# Patient Record
Sex: Female | Born: 1945 | Race: Black or African American | Hispanic: No | Smoking: Never smoker
Health system: Southern US, Community
[De-identification: ages and names within clinical notes are randomized; demographics above are authoritative.]

## PROBLEM LIST (undated history)

## (undated) DIAGNOSIS — I10 Essential (primary) hypertension: Secondary | ICD-10-CM

## (undated) DIAGNOSIS — M545 Low back pain, unspecified: Secondary | ICD-10-CM

## (undated) DIAGNOSIS — N183 Chronic kidney disease, stage 3 unspecified: Secondary | ICD-10-CM

## (undated) DIAGNOSIS — M170 Bilateral primary osteoarthritis of knee: Secondary | ICD-10-CM

## (undated) DIAGNOSIS — E079 Disorder of thyroid, unspecified: Secondary | ICD-10-CM

## (undated) DIAGNOSIS — M19011 Primary osteoarthritis, right shoulder: Secondary | ICD-10-CM

## (undated) DIAGNOSIS — G5603 Carpal tunnel syndrome, bilateral upper limbs: Secondary | ICD-10-CM

## (undated) DIAGNOSIS — J309 Allergic rhinitis, unspecified: Secondary | ICD-10-CM

## (undated) DIAGNOSIS — H547 Unspecified visual loss: Secondary | ICD-10-CM

## (undated) DIAGNOSIS — E785 Hyperlipidemia, unspecified: Secondary | ICD-10-CM

## (undated) DIAGNOSIS — G8929 Other chronic pain: Secondary | ICD-10-CM

## (undated) DIAGNOSIS — H269 Unspecified cataract: Secondary | ICD-10-CM

## (undated) HISTORY — PX: COLONOSCOPY: SHX174

## (undated) HISTORY — PX: CATARACT EXTRACTION: SUR2

## (undated) HISTORY — DX: Allergic rhinitis, unspecified: J30.9

## (undated) HISTORY — DX: Disorder of thyroid, unspecified: E07.9

## (undated) HISTORY — DX: Morbid (severe) obesity due to excess calories: E66.01

## (undated) HISTORY — DX: Essential (primary) hypertension: I10

## (undated) HISTORY — DX: Carpal tunnel syndrome, bilateral upper limbs: G56.03

## (undated) HISTORY — DX: Low back pain, unspecified: M54.50

## (undated) HISTORY — DX: Other chronic pain: G89.29

## (undated) HISTORY — PX: TUBAL LIGATION: SHX77

## (undated) HISTORY — DX: Unspecified visual loss: H54.7

## (undated) HISTORY — DX: Bilateral primary osteoarthritis of knee: M17.0

## (undated) HISTORY — DX: Primary osteoarthritis, right shoulder: M19.011

## (undated) HISTORY — DX: Chronic kidney disease, stage 3 unspecified: N18.30

## (undated) HISTORY — DX: Unspecified cataract: H26.9

## (undated) NOTE — *Deleted (*Deleted)
CARDIOLOGY CONSULT NOTE       Patient ID: Samantha [name] MRN: [id number] DOB/AGE: 1946/02/10 58 y.o.  Admit date: (Not on file) Referring Physician: Karl Ito Primary Physician: Renaye Rakers, MD Primary Cardiologist: New Reason for Consultation: ***  Active Problems:   * No active hospital problems. *   HPI:  23 y.o. with history of morbid obesity, DM, CKD 3b and hypertension. Activity also limited by chronic low Back pain and hip pain She is retired/widowed Use to smoke mariajuana and not cigarettes has had COVID vaccine  ? Some leg pain with Zetia but able to take statin   CR is 1.82 K 3.8    ROS All other systems reviewed and negative except as noted above  Past Medical History:  Diagnosis Date  . Diabetes mellitus   . Hyperlipidemia   . Hypertension     Family History  Problem Relation Age of Onset  . Breast cancer Mother     Social History   Socioeconomic History  . Marital status: Widowed    Spouse name: Not on file  . Number of children: Not on file  . Years of education: Not on file  . Highest education level: Not on file  Occupational History  . Not on file  Tobacco Use  . Smoking status: Never Smoker  . Smokeless tobacco: Never Used  Substance and Sexual Activity  . Alcohol use: No  . Drug use: No  . Sexual activity: Yes    Birth control/protection: Surgical  Other Topics Concern  . Not on file  Social History Narrative  . Not on file   Social Determinants of Health   Financial Resource Strain:   . Difficulty of Paying Living Expenses: Not on file  Food Insecurity:   . Worried About Programme researcher, broadcasting/film/video in the Last Year: Not on file  . Ran Out of Food in the Last Year: Not on file  Transportation Needs:   . Lack of Transportation (Medical): Not on file  . Lack of Transportation (Non-Medical): Not on file  Physical Activity:   . Days of Exercise per Week: Not on file  . Minutes of Exercise per Session: Not on file  Stress:   . Feeling  of Stress : Not on file  Social Connections:   . Frequency of Communication with Friends and Family: Not on file  . Frequency of Social Gatherings with Friends and Family: Not on file  . Attends Religious Services: Not on file  . Active Member of Clubs or Organizations: Not on file  . Attends Banker Meetings: Not on file  . Marital Status: Not on file  Intimate Partner Violence:   . Fear of Current or Ex-Partner: Not on file  . Emotionally Abused: Not on file  . Physically Abused: Not on file  . Sexually Abused: Not on file    Past Surgical History:  Procedure Laterality Date  . TUBAL LIGATION        Current Outpatient Medications:  .  acetaminophen (TYLENOL) 500 MG tablet, Take 1,000 mg by mouth every 6 (six) hours as needed for mild pain., Disp: , Rfl:  .  amLODipine (NORVASC) 5 MG tablet, Take 5 mg by mouth 2 (two) times daily., Disp: , Rfl:  .  aspirin EC 81 MG tablet, Take 81 mg by mouth daily., Disp: , Rfl:  .  cloNIDine (CATAPRES) 0.1 MG tablet, Take 0.1 mg by mouth daily. , Disp: , Rfl:  .  Cyanocobalamin ([employer]  B-12) 1500 MCG TBDP, Take 1 tablet by mouth daily., Disp: , Rfl:  .  cyclobenzaprine (FLEXERIL) 5 MG tablet, Take 1 tablet (5 mg total) by mouth 3 (three) times daily as needed for muscle spasms., Disp: 15 tablet, Rfl: 0 .  ezetimibe (ZETIA) 10 MG tablet, Take 10 mg by mouth daily., Disp: , Rfl:  .  Homeopathic Products ([employer] LEG CRAMPS PAIN RELIEF PO), Take 1 tablet by mouth daily as needed (for cramping). , Disp: , Rfl:  .  linagliptin (TRADJENTA) 5 MG TABS tablet, Take 5 mg by mouth daily. , Disp: , Rfl:  .  lisinopril-hydrochlorothiazide (PRINZIDE,ZESTORETIC) 20-12.5 MG per tablet, Take 2 tablets by mouth daily., Disp: , Rfl:  .  metFORMIN (GLUCOPHAGE) 500 MG tablet, Take 500 mg by mouth 2 (two) times daily with a meal., Disp: , Rfl:  .  potassium chloride SA (K-DUR,KLOR-CON) 20 MEQ tablet, Take 20 mEq by mouth daily., Disp: , Rfl:     Physical Exam:  There were no vitals taken for this visit. *** HELP TEXT ***  This SmartLink requires parameters. Parameters are variables that are added to the [facility] name to request specific information. The parameter for .curwt is the number of readings to display.  For example: .curwt[4  In this example, the SmartLink displays the last four encounter readings.    {physical ZOXW:[id number]}  Labs:   Lab Results  Component Value Date   WBC 7.6 05/08/2018   HGB 11.0 (L) 05/08/2018   HCT 37.7 05/08/2018   MCV 84.7 05/08/2018   PLT 270 05/08/2018   No results for input(s): NA, K, CL, CO2, BUN, CREATININE, CALCIUM, PROT, BILITOT, ALKPHOS, ALT, AST, GLUCOSE in the last 168 hours.  Invalid input(s): LABALBU Lab Results  Component Value Date   TROPONINI <0.03 09/06/2015    Lab Results  Component Value Date   CHOL 166 02/09/2009   CHOL 196 09/29/2008   CHOL 185 01/16/2008   Lab Results  Component Value Date   HDL 60 02/09/2009   HDL 74 09/29/2008   HDL 75 01/16/2008   Lab Results  Component Value Date   LDLCALC 89 02/09/2009   LDLCALC 100 (H) 09/29/2008   LDLCALC 100 (H) 01/16/2008   Lab Results  Component Value Date   TRIG 86 02/09/2009   TRIG 111 09/29/2008   TRIG 52 01/16/2008   Lab Results  Component Value Date   CHOLHDL 2.8 Ratio 02/09/2009   CHOLHDL 2.6 Ratio 09/29/2008   CHOLHDL 2.5 Ratio 01/16/2008   No results found for: LDLDIRECT    Radiology: No results found.  EKG: ***   ASSESSMENT AND PLAN:    Signed: Charlton Haws 03/18/2020, 2:22 PM

---

## 2001-12-31 ENCOUNTER — Inpatient Hospital Stay (HOSPITAL_COMMUNITY): Admission: AD | Admit: 2001-12-31 | Discharge: 2002-01-03 | Payer: Self-pay | Admitting: Family Medicine

## 2002-02-04 ENCOUNTER — Encounter: Admission: RE | Admit: 2002-02-04 | Discharge: 2002-05-05 | Payer: Self-pay | Admitting: Internal Medicine

## 2002-04-11 ENCOUNTER — Other Ambulatory Visit: Admission: RE | Admit: 2002-04-11 | Discharge: 2002-04-11 | Payer: Self-pay | Admitting: Obstetrics and Gynecology

## 2002-04-30 ENCOUNTER — Ambulatory Visit (HOSPITAL_COMMUNITY): Admission: RE | Admit: 2002-04-30 | Discharge: 2002-04-30 | Payer: Self-pay | Admitting: Internal Medicine

## 2002-04-30 ENCOUNTER — Encounter: Payer: Self-pay | Admitting: Internal Medicine

## 2002-06-17 ENCOUNTER — Ambulatory Visit (HOSPITAL_COMMUNITY): Admission: RE | Admit: 2002-06-17 | Discharge: 2002-06-17 | Payer: Self-pay | Admitting: Internal Medicine

## 2002-06-17 ENCOUNTER — Encounter: Payer: Self-pay | Admitting: Internal Medicine

## 2002-08-18 ENCOUNTER — Ambulatory Visit (HOSPITAL_COMMUNITY): Admission: RE | Admit: 2002-08-18 | Discharge: 2002-08-18 | Payer: Self-pay | Admitting: General Surgery

## 2002-08-18 ENCOUNTER — Encounter: Payer: Self-pay | Admitting: General Surgery

## 2002-12-19 ENCOUNTER — Encounter: Payer: Self-pay | Admitting: Internal Medicine

## 2002-12-19 ENCOUNTER — Ambulatory Visit (HOSPITAL_COMMUNITY): Admission: RE | Admit: 2002-12-19 | Discharge: 2002-12-19 | Payer: Self-pay | Admitting: Internal Medicine

## 2002-12-30 ENCOUNTER — Ambulatory Visit (HOSPITAL_BASED_OUTPATIENT_CLINIC_OR_DEPARTMENT_OTHER): Admission: RE | Admit: 2002-12-30 | Discharge: 2002-12-30 | Payer: Self-pay | Admitting: Ophthalmology

## 2004-03-17 ENCOUNTER — Ambulatory Visit (HOSPITAL_COMMUNITY): Admission: RE | Admit: 2004-03-17 | Discharge: 2004-03-17 | Payer: Self-pay | Admitting: General Surgery

## 2004-04-13 ENCOUNTER — Encounter (INDEPENDENT_AMBULATORY_CARE_PROVIDER_SITE_OTHER): Payer: Self-pay | Admitting: Family Medicine

## 2004-04-21 ENCOUNTER — Ambulatory Visit (HOSPITAL_COMMUNITY): Admission: RE | Admit: 2004-04-21 | Discharge: 2004-04-21 | Payer: Self-pay | Admitting: General Surgery

## 2005-07-13 ENCOUNTER — Ambulatory Visit (HOSPITAL_COMMUNITY): Admission: RE | Admit: 2005-07-13 | Discharge: 2005-07-13 | Payer: Self-pay | Admitting: General Surgery

## 2006-01-05 ENCOUNTER — Emergency Department (HOSPITAL_COMMUNITY): Admission: EM | Admit: 2006-01-05 | Discharge: 2006-01-05 | Payer: Self-pay | Admitting: Emergency Medicine

## 2006-02-21 ENCOUNTER — Ambulatory Visit: Payer: Self-pay | Admitting: Family Medicine

## 2006-02-21 LAB — CONVERTED CEMR LAB: Hgb A1c MFr Bld: 6.1 %

## 2006-03-01 ENCOUNTER — Ambulatory Visit: Payer: Self-pay | Admitting: Family Medicine

## 2006-03-15 ENCOUNTER — Ambulatory Visit: Payer: Self-pay | Admitting: Family Medicine

## 2006-04-09 ENCOUNTER — Encounter (INDEPENDENT_AMBULATORY_CARE_PROVIDER_SITE_OTHER): Payer: Self-pay | Admitting: Family Medicine

## 2006-04-09 LAB — CONVERTED CEMR LAB: Blood Glucose, Fasting: 87 mg/dL

## 2006-04-12 ENCOUNTER — Ambulatory Visit: Payer: Self-pay | Admitting: Family Medicine

## 2006-05-07 ENCOUNTER — Encounter: Payer: Self-pay | Admitting: Family Medicine

## 2006-05-07 DIAGNOSIS — D649 Anemia, unspecified: Secondary | ICD-10-CM | POA: Insufficient documentation

## 2006-05-07 DIAGNOSIS — E669 Obesity, unspecified: Secondary | ICD-10-CM

## 2006-05-07 DIAGNOSIS — I10 Essential (primary) hypertension: Secondary | ICD-10-CM | POA: Insufficient documentation

## 2006-05-07 DIAGNOSIS — G5600 Carpal tunnel syndrome, unspecified upper limb: Secondary | ICD-10-CM

## 2006-05-07 DIAGNOSIS — E785 Hyperlipidemia, unspecified: Secondary | ICD-10-CM

## 2006-08-20 ENCOUNTER — Encounter (INDEPENDENT_AMBULATORY_CARE_PROVIDER_SITE_OTHER): Payer: Self-pay | Admitting: Family Medicine

## 2006-09-22 ENCOUNTER — Telehealth (INDEPENDENT_AMBULATORY_CARE_PROVIDER_SITE_OTHER): Payer: Self-pay | Admitting: Family Medicine

## 2006-11-06 ENCOUNTER — Encounter (INDEPENDENT_AMBULATORY_CARE_PROVIDER_SITE_OTHER): Payer: Self-pay | Admitting: Family Medicine

## 2006-11-14 ENCOUNTER — Encounter (INDEPENDENT_AMBULATORY_CARE_PROVIDER_SITE_OTHER): Payer: Self-pay | Admitting: Family Medicine

## 2006-11-16 ENCOUNTER — Encounter (INDEPENDENT_AMBULATORY_CARE_PROVIDER_SITE_OTHER): Payer: Self-pay | Admitting: Family Medicine

## 2006-11-20 ENCOUNTER — Ambulatory Visit: Payer: Self-pay | Admitting: Family Medicine

## 2006-11-20 LAB — CONVERTED CEMR LAB
Glucose, Bld: 115 mg/dL
LDL Goal: 70 mg/dL

## 2006-12-03 ENCOUNTER — Encounter (INDEPENDENT_AMBULATORY_CARE_PROVIDER_SITE_OTHER): Payer: Self-pay | Admitting: Family Medicine

## 2006-12-04 LAB — CONVERTED CEMR LAB
ALT: 11 units/L (ref 0–35)
Alkaline Phosphatase: 64 units/L (ref 39–117)
Basophils Relative: 0 % (ref 0–1)
CO2: 26 meq/L (ref 19–32)
Calcium: 9.6 mg/dL (ref 8.4–10.5)
Chloride: 107 meq/L (ref 96–112)
Eosinophils Absolute: 0.1 10*3/uL (ref 0.0–0.7)
Glucose, Bld: 109 mg/dL — ABNORMAL HIGH (ref 70–99)
HCT: 38.2 % (ref 36.0–46.0)
Hemoglobin: 11.3 g/dL — ABNORMAL LOW (ref 12.0–15.0)
LDL Cholesterol: 118 mg/dL — ABNORMAL HIGH (ref 0–99)
Lymphocytes Relative: 36 % (ref 12–46)
MCHC: 29.6 g/dL — ABNORMAL LOW (ref 30.0–36.0)
MCV: 87.6 fL (ref 78.0–100.0)
Microalb Creat Ratio: 8.3 mg/g (ref 0.0–30.0)
Neutro Abs: 2.1 10*3/uL (ref 1.7–7.7)
Neutrophils Relative %: 55 % (ref 43–77)
Potassium: 4 meq/L (ref 3.5–5.3)
Total Bilirubin: 0.6 mg/dL (ref 0.3–1.2)
Total Protein: 7.3 g/dL (ref 6.0–8.3)
WBC: 3.8 10*3/uL — ABNORMAL LOW (ref 4.0–10.5)

## 2006-12-05 ENCOUNTER — Encounter (INDEPENDENT_AMBULATORY_CARE_PROVIDER_SITE_OTHER): Payer: Self-pay | Admitting: Family Medicine

## 2006-12-13 ENCOUNTER — Encounter (INDEPENDENT_AMBULATORY_CARE_PROVIDER_SITE_OTHER): Payer: Self-pay | Admitting: Family Medicine

## 2006-12-18 ENCOUNTER — Ambulatory Visit: Payer: Self-pay | Admitting: Family Medicine

## 2006-12-18 ENCOUNTER — Telehealth (INDEPENDENT_AMBULATORY_CARE_PROVIDER_SITE_OTHER): Payer: Self-pay | Admitting: *Deleted

## 2006-12-18 DIAGNOSIS — E785 Hyperlipidemia, unspecified: Secondary | ICD-10-CM | POA: Insufficient documentation

## 2006-12-18 DIAGNOSIS — E119 Type 2 diabetes mellitus without complications: Secondary | ICD-10-CM

## 2006-12-19 ENCOUNTER — Encounter (INDEPENDENT_AMBULATORY_CARE_PROVIDER_SITE_OTHER): Payer: Self-pay | Admitting: Family Medicine

## 2006-12-19 ENCOUNTER — Telehealth (INDEPENDENT_AMBULATORY_CARE_PROVIDER_SITE_OTHER): Payer: Self-pay | Admitting: *Deleted

## 2006-12-19 LAB — CONVERTED CEMR LAB
Ferritin: 93 ng/mL (ref 10–291)
Gardnerella vaginalis: POSITIVE — AB
Retic Count, Absolute: 60.8 (ref 19.0–186.0)
Retic Ct Pct: 1.4 % (ref 0.4–3.1)
Saturation Ratios: 30 % (ref 20–55)
UIBC: 225 ug/dL
Vitamin B-12: 353 pg/mL (ref 211–911)

## 2006-12-20 ENCOUNTER — Telehealth (INDEPENDENT_AMBULATORY_CARE_PROVIDER_SITE_OTHER): Payer: Self-pay | Admitting: *Deleted

## 2006-12-21 ENCOUNTER — Telehealth (INDEPENDENT_AMBULATORY_CARE_PROVIDER_SITE_OTHER): Payer: Self-pay | Admitting: Family Medicine

## 2006-12-24 ENCOUNTER — Encounter (INDEPENDENT_AMBULATORY_CARE_PROVIDER_SITE_OTHER): Payer: Self-pay | Admitting: Family Medicine

## 2007-01-08 ENCOUNTER — Encounter (INDEPENDENT_AMBULATORY_CARE_PROVIDER_SITE_OTHER): Payer: Self-pay | Admitting: Family Medicine

## 2007-01-29 ENCOUNTER — Ambulatory Visit: Payer: Self-pay | Admitting: Family Medicine

## 2007-01-30 ENCOUNTER — Telehealth (INDEPENDENT_AMBULATORY_CARE_PROVIDER_SITE_OTHER): Payer: Self-pay | Admitting: *Deleted

## 2007-01-30 ENCOUNTER — Encounter (INDEPENDENT_AMBULATORY_CARE_PROVIDER_SITE_OTHER): Payer: Self-pay | Admitting: Family Medicine

## 2007-01-30 LAB — CONVERTED CEMR LAB
ALT: 11 units/L (ref 0–35)
AST: 18 units/L (ref 0–37)
Albumin: 4.4 g/dL (ref 3.5–5.2)
Alkaline Phosphatase: 84 units/L (ref 39–117)
BUN: 14 mg/dL (ref 6–23)
CO2: 22 meq/L (ref 19–32)
Creatinine, Ser: 1.06 mg/dL (ref 0.40–1.20)
Glucose, Bld: 104 mg/dL — ABNORMAL HIGH (ref 70–99)
Total Bilirubin: 0.7 mg/dL (ref 0.3–1.2)
Total Protein: 7.3 g/dL (ref 6.0–8.3)

## 2007-06-03 ENCOUNTER — Ambulatory Visit: Payer: Self-pay | Admitting: Family Medicine

## 2007-06-03 LAB — CONVERTED CEMR LAB: Hgb A1c MFr Bld: 6.3 %

## 2007-06-25 ENCOUNTER — Other Ambulatory Visit: Admission: RE | Admit: 2007-06-25 | Discharge: 2007-06-25 | Payer: Self-pay | Admitting: Obstetrics and Gynecology

## 2007-08-28 ENCOUNTER — Encounter (INDEPENDENT_AMBULATORY_CARE_PROVIDER_SITE_OTHER): Payer: Self-pay | Admitting: Family Medicine

## 2007-11-11 ENCOUNTER — Ambulatory Visit: Payer: Self-pay | Admitting: Family Medicine

## 2007-11-11 LAB — CONVERTED CEMR LAB: Hgb A1c MFr Bld: 6.4 %

## 2007-11-25 ENCOUNTER — Encounter: Payer: Self-pay | Admitting: Orthopedic Surgery

## 2007-11-25 ENCOUNTER — Ambulatory Visit (HOSPITAL_COMMUNITY): Admission: RE | Admit: 2007-11-25 | Discharge: 2007-11-25 | Payer: Self-pay | Admitting: Family Medicine

## 2007-11-25 ENCOUNTER — Ambulatory Visit: Payer: Self-pay | Admitting: Family Medicine

## 2007-11-28 ENCOUNTER — Ambulatory Visit (HOSPITAL_COMMUNITY): Admission: RE | Admit: 2007-11-28 | Discharge: 2007-11-28 | Payer: Self-pay | Admitting: Family Medicine

## 2007-12-10 ENCOUNTER — Encounter (INDEPENDENT_AMBULATORY_CARE_PROVIDER_SITE_OTHER): Payer: Self-pay | Admitting: Family Medicine

## 2007-12-16 ENCOUNTER — Ambulatory Visit: Payer: Self-pay | Admitting: Orthopedic Surgery

## 2007-12-16 DIAGNOSIS — M1710 Unilateral primary osteoarthritis, unspecified knee: Secondary | ICD-10-CM

## 2007-12-16 DIAGNOSIS — M23302 Other meniscus derangements, unspecified lateral meniscus, unspecified knee: Secondary | ICD-10-CM | POA: Insufficient documentation

## 2008-01-01 ENCOUNTER — Ambulatory Visit: Payer: Self-pay | Admitting: Orthopedic Surgery

## 2008-01-06 ENCOUNTER — Encounter (INDEPENDENT_AMBULATORY_CARE_PROVIDER_SITE_OTHER): Payer: Self-pay | Admitting: Family Medicine

## 2008-01-16 ENCOUNTER — Ambulatory Visit: Payer: Self-pay | Admitting: Family Medicine

## 2008-01-17 LAB — CONVERTED CEMR LAB
AST: 19 units/L (ref 0–37)
BUN: 28 mg/dL — ABNORMAL HIGH (ref 6–23)
CO2: 24 meq/L (ref 19–32)
Cholesterol: 185 mg/dL (ref 0–200)
Creatinine, Ser: 1.41 mg/dL — ABNORMAL HIGH (ref 0.40–1.20)
Creatinine, Urine: 117.5 mg/dL
Eosinophils Absolute: 0.1 10*3/uL (ref 0.0–0.7)
Eosinophils Relative: 1 % (ref 0–5)
HCT: 37.8 % (ref 36.0–46.0)
MCHC: 30.2 g/dL (ref 30.0–36.0)
Microalb Creat Ratio: 5.4 mg/g (ref 0.0–30.0)
Microalb, Ur: 0.64 mg/dL (ref 0.00–1.89)
Potassium: 3.9 meq/L (ref 3.5–5.3)
RBC: 4.46 M/uL (ref 3.87–5.11)
RDW: 13.9 % (ref 11.5–15.5)
Sodium: 139 meq/L (ref 135–145)
TSH: 1.318 microintl units/mL (ref 0.350–4.50)
Total CHOL/HDL Ratio: 2.5
Triglycerides: 52 mg/dL (ref <150)
VLDL: 10 mg/dL (ref 0–40)
WBC: 5.2 10*3/uL (ref 4.0–10.5)

## 2008-02-04 ENCOUNTER — Ambulatory Visit: Payer: Self-pay | Admitting: Family Medicine

## 2008-02-04 ENCOUNTER — Other Ambulatory Visit: Admission: RE | Admit: 2008-02-04 | Discharge: 2008-02-04 | Payer: Self-pay | Admitting: Family Medicine

## 2008-02-04 ENCOUNTER — Encounter (INDEPENDENT_AMBULATORY_CARE_PROVIDER_SITE_OTHER): Payer: Self-pay | Admitting: Family Medicine

## 2008-02-06 ENCOUNTER — Ambulatory Visit (HOSPITAL_COMMUNITY): Admission: RE | Admit: 2008-02-06 | Discharge: 2008-02-06 | Payer: Self-pay | Admitting: Family Medicine

## 2008-03-31 ENCOUNTER — Ambulatory Visit: Payer: Self-pay | Admitting: Family Medicine

## 2008-03-31 LAB — CONVERTED CEMR LAB: Glucose, Bld: 128 mg/dL

## 2008-04-01 ENCOUNTER — Encounter (INDEPENDENT_AMBULATORY_CARE_PROVIDER_SITE_OTHER): Payer: Self-pay | Admitting: Family Medicine

## 2008-04-02 LAB — CONVERTED CEMR LAB
ALT: 14 units/L (ref 0–35)
AST: 17 units/L (ref 0–37)
Alkaline Phosphatase: 75 units/L (ref 39–117)
Bilirubin, Direct: 0.1 mg/dL (ref 0.0–0.3)
Indirect Bilirubin: 0.7 mg/dL (ref 0.0–0.9)
Total Bilirubin: 0.8 mg/dL (ref 0.3–1.2)
Total Protein: 7.1 g/dL (ref 6.0–8.3)

## 2008-06-30 ENCOUNTER — Ambulatory Visit: Payer: Self-pay | Admitting: Family Medicine

## 2008-09-12 ENCOUNTER — Emergency Department (HOSPITAL_COMMUNITY): Admission: EM | Admit: 2008-09-12 | Discharge: 2008-09-12 | Payer: Self-pay | Admitting: Emergency Medicine

## 2008-09-17 ENCOUNTER — Ambulatory Visit: Payer: Self-pay | Admitting: Family Medicine

## 2008-09-29 ENCOUNTER — Encounter (INDEPENDENT_AMBULATORY_CARE_PROVIDER_SITE_OTHER): Payer: Self-pay | Admitting: Family Medicine

## 2008-09-30 ENCOUNTER — Encounter (INDEPENDENT_AMBULATORY_CARE_PROVIDER_SITE_OTHER): Payer: Self-pay | Admitting: *Deleted

## 2008-09-30 LAB — CONVERTED CEMR LAB
Albumin: 4.2 g/dL (ref 3.5–5.2)
Calcium: 9.7 mg/dL (ref 8.4–10.5)
Cholesterol: 196 mg/dL (ref 0–200)
HDL: 74 mg/dL (ref >39)
LDL Cholesterol: 100 mg/dL — ABNORMAL HIGH (ref 0–99)
Potassium: 3.8 meq/L (ref 3.5–5.3)
Total Bilirubin: 0.6 mg/dL (ref 0.3–1.2)
Total CHOL/HDL Ratio: 2.6
VLDL: 22 mg/dL (ref 0–40)

## 2008-10-02 ENCOUNTER — Ambulatory Visit: Payer: Self-pay | Admitting: Family Medicine

## 2009-01-01 ENCOUNTER — Ambulatory Visit: Payer: Self-pay | Admitting: Family Medicine

## 2009-01-01 LAB — CONVERTED CEMR LAB: Hgb A1c MFr Bld: 6.8 %

## 2009-02-09 ENCOUNTER — Other Ambulatory Visit: Admission: RE | Admit: 2009-02-09 | Discharge: 2009-02-09 | Payer: Self-pay | Admitting: Family Medicine

## 2009-02-10 ENCOUNTER — Ambulatory Visit: Payer: Self-pay | Admitting: Family Medicine

## 2009-02-10 LAB — CONVERTED CEMR LAB
ALT: 11 units/L (ref 0–35)
AST: 18 units/L (ref 0–37)
Albumin: 4 g/dL (ref 3.5–5.2)
Alkaline Phosphatase: 76 units/L (ref 39–117)
BUN: 17 mg/dL (ref 6–23)
CO2: 23 meq/L (ref 19–32)
Calcium: 9.3 mg/dL (ref 8.4–10.5)
Creatinine, Ser: 1.2 mg/dL (ref 0.40–1.20)
Creatinine, Urine: 216.4 mg/dL
Eosinophils Absolute: 0.1 10*3/uL (ref 0.0–0.7)
Eosinophils Relative: 2 % (ref 0–5)
Glucose, Bld: 144 mg/dL — ABNORMAL HIGH (ref 70–99)
LDL Cholesterol: 89 mg/dL (ref 0–99)
MCHC: 31.1 g/dL (ref 30.0–36.0)
MCV: 84.3 fL (ref 78.0–100.0)
Monocytes Absolute: 0.3 10*3/uL (ref 0.1–1.0)
Neutrophils Relative %: 50 % (ref 43–77)
Platelets: 262 10*3/uL (ref 150–400)
RBC: 4.2 M/uL (ref 3.87–5.11)
RDW: 14.1 % (ref 11.5–15.5)
Sodium: 138 meq/L (ref 135–145)
Total CHOL/HDL Ratio: 2.8
Total Protein: 7.1 g/dL (ref 6.0–8.3)
WBC: 5.5 10*3/uL (ref 4.0–10.5)

## 2009-02-11 LAB — CONVERTED CEMR LAB: Ferritin: 127 ng/mL (ref 10–291)

## 2009-02-19 ENCOUNTER — Ambulatory Visit (HOSPITAL_COMMUNITY): Admission: RE | Admit: 2009-02-19 | Discharge: 2009-02-19 | Payer: Self-pay | Admitting: Family Medicine

## 2009-11-29 ENCOUNTER — Ambulatory Visit (HOSPITAL_COMMUNITY): Admission: RE | Admit: 2009-11-29 | Discharge: 2009-11-29 | Payer: Self-pay | Admitting: Family Medicine

## 2009-12-20 ENCOUNTER — Encounter: Payer: Self-pay | Admitting: Urgent Care

## 2009-12-20 ENCOUNTER — Ambulatory Visit: Payer: Self-pay | Admitting: Internal Medicine

## 2010-01-20 ENCOUNTER — Ambulatory Visit (HOSPITAL_COMMUNITY): Admission: RE | Admit: 2010-01-20 | Discharge: 2010-01-20 | Payer: Self-pay | Admitting: Internal Medicine

## 2010-01-20 ENCOUNTER — Ambulatory Visit: Payer: Self-pay | Admitting: Internal Medicine

## 2010-01-24 ENCOUNTER — Encounter: Payer: Self-pay | Admitting: Internal Medicine

## 2010-03-07 ENCOUNTER — Ambulatory Visit (HOSPITAL_COMMUNITY): Admission: RE | Admit: 2010-03-07 | Discharge: 2010-03-07 | Payer: Self-pay | Admitting: Family Medicine

## 2010-04-25 ENCOUNTER — Ambulatory Visit (HOSPITAL_COMMUNITY): Admission: RE | Admit: 2010-04-25 | Discharge: 2010-04-25 | Payer: Self-pay | Admitting: Family Medicine

## 2010-07-03 ENCOUNTER — Encounter: Payer: Self-pay | Admitting: Family Medicine

## 2010-07-04 ENCOUNTER — Encounter: Payer: Self-pay | Admitting: Family Medicine

## 2010-07-12 NOTE — Letter (Signed)
Summary: referral from dr. bland  referral from dr. bland   Imported By: Rosine Beat 12/20/2009 15:36:04  _____________________________________________________________________  External Attachment:    Type:   Image     Comment:   External Document

## 2010-07-12 NOTE — Assessment & Plan Note (Signed)
Summary: LOW HEMOGLOBIN,CONSULT FOR TCS/SS   Visit Type:  Initial Consult Referring Provider:  Dr Parke Simmers Primary Care Provider:  Dr. Renaye Rakers  Chief Complaint:  low hgb, consult for tcs, and hx of constipation.  History of Present Illness: 65 y/o black female w/ hx lifelong anemia, recently hgb dropped to 11.4.  Denies rectal bleeding, melena, N/V/D, abd pain or constipation.  Needs colonoscopy due to poor prep on last exam 2005 Dr Katrinka Blazing.  Takes ASA 81mg  daily.  Denies any other NSAIDS.  Denies dyysphagia or odynophagia.  Denies heartburn, indigestion.  Denies fatigue, chest pain, palpitations, SOB.  Is craving ice.  Never required transfusion.  Denies donation.  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 Every Other Day 2)  Lipitor 40 Mg Tabs (Atorvastatin Calcium) .... One Daily 3)  Klor-Con 10 10 Meq Cr-Tabs (Potassium Chloride) .... Once Daily 4)  Lisinopril-Hydrochlorothiazide 20-12.5 Mg Tabs (Lisinopril-Hydrochlorothiazide) .... Two Daily 5)  Cardizem La 360 Mg Tb24 (Diltiazem Hcl Coated Beads) .... Daily 6)  Clonidine Hcl 0.1 Mg  Tabs (Clonidine Hcl) .... Two Times A Day 7)  Metformin Hcl 500 Mg Tabs (Metformin Hcl) .... Two Times A Day 8)  Accu-Chek Softclix Lancets  Misc (Lancets) .... Check Fsbs Daily  Allergies (verified): No Known Drug Allergies  Past History:  Past Medical History: Current Problems:  Last colonoscopy 04/2004 sigmoid diverticula Dr Katrinka Blazing (POOR PREP) DERANGEMENT MENISCUS (ICD-717.5) ANSERINE BURSITIS, RIGHT (ICD-726.61) KNEE, ARTHRITIS, DEGEN./OSTEO (ICD-715.96) KNEE PAIN, RIGHT  (ICD-719.46) OBESITY NOS (ICD-278.00) CARPAL TUNNEL SYNDROME (ICD-354.0) HYPERTENSION (ICD-401.9) HYPERLIPIDEMIA (ICD-272.4) DIABETES MELLITUS, TYPE II, CONTROLLED (ICD-250.00) ANEMIA-NOS (ICD-285.9)  Family History: No known family history of colorectal carcinoma, IBD.  Father: kidney failure - HTN Dead 77 Mother: breast ca; HTN; kidney failure; DM 82 4 sisters: HTN - ages 22, 65 and 83, one dead at 42 due to non-alcoholic liver disease (multicystic?)  Brothers none 3 children: healthy - boys - 1 age 35 - Healthy, Girls x 2 - age 31 and 99 - healthy  Social History: Married Never Smoked Alcohol use-no Drug use-no Occupation: Doctor, general practice - retired 2008 Lives with husband  Education - high school and some college. Patient gets regular exercise. Does Patient Exercise:  yes  Review of Systems General:  Complains of weight loss; denies fever, chills, sweats, anorexia, fatigue, weakness, malaise, and sleep disorder; 14# intentional w/ exercise/dieting. CV:  Denies chest pains, angina, palpitations, syncope, dyspnea on exertion, orthopnea, PND, peripheral edema, and claudication. Resp:  Denies dyspnea at rest, dyspnea with exercise, cough, sputum, wheezing, coughing up blood, and pleurisy. GI:  Denies difficulty swallowing, pain on swallowing, nausea, indigestion/heartburn, vomiting, vomiting blood, abdominal pain, jaundice, gas/bloating, diarrhea, constipation, change in bowel habits, bloody BM's, black BMs, and fecal incontinence. GU:  Denies urinary burning, blood in urine, nocturnal urination, urinary frequency, and urinary incontinence. MS:  Denies joint pain / LOM, joint swelling, joint stiffness, joint deformity, low back pain, muscle weakness, muscle cramps, muscle atrophy, leg pain at night, leg pain with exertion, and shoulder pain / LOM hand / wrist pain (CTS). Derm:  Denies rash, itching, dry skin, hives, moles, warts, and unhealing ulcers. Psych:  Denies depression,  anxiety, memory loss, suicidal ideation, hallucinations, paranoia, phobia, and confusion. Heme:  Complains of bruising; denies bleeding and enlarged lymph nodes.  Vital Signs:  Patient profile:   65 year old female Menstrual status:  postmenopausal Height:      61 inches Weight:      200 pounds BMI:     37.93 Temp:     98.3 degrees F oral Pulse rate:   88 / minute BP sitting:   120 / 82  (left arm) Cuff size:   large  Vitals Entered By: Hendricks Limes LPN (December 20, 2009 10:52 AM)  Physical Exam  General:  obese.  Well developed, well nourished, no acute distress. Head:  Normocephalic and atraumatic. Eyes:  Sclera clear, no icterus. Ears:  Normal auditory acuity. Nose:  No deformity, discharge,  or lesions. Mouth:  No deformity or lesions, dentition normal. Neck:  Supple; no masses or thyromegaly. Lungs:  Clear throughout to auscultation. Heart:  Regular rate and rhythm; no murmurs, rubs,  or bruits. Abdomen:  Soft, nontender and nondistended. No masses, hepatosplenomegaly or hernias noted. Normal bowel sounds.obese, without guarding, and without rebound.   Rectal:  rectal mass palpated about 4-5 cm on DRE.  Anterior fullness from possible uterine lesions (?fibroids) as well.  Exam limited given pt's body habitus.  Ext hemorrhoids, non-thrombosed.  Heme negative stool.   Msk:  Symmetrical with no gross deformities. Normal posture. Pulses:  Normal pulses noted. Extremities:  No clubbing, cyanosis, edema or deformities noted. Neurologic:  Alert and  oriented x4;  grossly normal neurologically. Skin:  Intact without significant lesions or rashes. Cervical Nodes:  No significant cervical adenopathy. Psych:  Alert and cooperative. Normal mood and affect.  Impression & Recommendations:  Problem # 1:  ANEMIA-NOS (ICD-285.9) 65 y/o black female w/ recent drop in hgb with hx chronic normocytic anemia.  Last colonscopy w/ POOR PREP 2005 by Dr Katrinka Blazing, will need repeat exam to r/o colorectal  CA given digital rectal exam findings.  Uterine fibroids could be contributing to anemia as well, but pt denies any recent vaginal bleeding, nor upper GI tract symptoms.  Diagnostic colonoscopy to be performed by Dr. Suszanne Conners Rourk in the near future.  I have discussed risks and benefits which include, but are not limited to, bleeding, infection, perforation, or medication reaction.  The patient agrees with this plan and consent will be obtained.  Orders: Consultation Level III [phone])  Appended Document: LOW HEMOGLOBIN,CONSULT FOR TCS/SS       Allergies: No Known Drug Allergies   Other Orders: Hemoccult Guaiac-1 spec.(in office) ([id number])

## 2010-07-12 NOTE — Letter (Signed)
Summary: Patient Notice, Colon Biopsy Results  [facility]  [address]   [place], [place] [address]   Phone: [phone]  Fax: [phone]       January 24, 2010   Samantha [name] [address] [place], [place]  [address] 1945/12/30    Dear Ms. Samantha [name],  I am pleased to inform you that the biopsies taken during your recent colonoscopy did not show any evidence of cancer upon pathologic examination.  Additional information/recommendations:  No further action is needed at this time as long as you aren't having in blood in your stools.  Please follow-up with your primary care physician for your other healthcare needs.  You should have a repeat colonoscopy examination  in 7 years.  Please call us if you are having persistent problems or have questions about your condition that have not been fully answered at this time.  Sincerely,    R. Roetta Sessions MD, FACP [facility] Ph: [phone]    Fax: [phone]   Appended Document: Patient Notice, Colon Biopsy Results mailed letter to pt  Appended Document: Patient Notice, Colon Biopsy Results REMINDER IN COMPUTER

## 2010-07-12 NOTE — Letter (Signed)
Summary: Internal Other  Internal Other   Imported By: Peggyann Shoals 12/20/2009 11:47:10  _____________________________________________________________________  External Attachment:    Type:   Image     Comment:   External Document

## 2010-08-26 LAB — GLUCOSE, CAPILLARY: Glucose-Capillary: 125 mg/dL — ABNORMAL HIGH (ref 70–99)

## 2010-09-21 LAB — BASIC METABOLIC PANEL
CO2: 29 mEq/L (ref 19–32)
Chloride: 103 mEq/L (ref 96–112)
Creatinine, Ser: 1.31 mg/dL — ABNORMAL HIGH (ref 0.4–1.2)
GFR calc Af Amer: 50 mL/min — ABNORMAL LOW (ref >60)
Glucose, Bld: 142 mg/dL — ABNORMAL HIGH (ref 70–99)
Sodium: 138 mEq/L (ref 135–145)

## 2010-10-28 NOTE — H&P (Signed)
Samantha [name], Samantha [name]                   ACCOUNT NO.:  [id number]   MEDICAL RECORD NO.:  [id number]          PATIENT TYPE:  AMB   LOCATION:  DAY                           FACILITY:  [facility]   PHYSICIAN:  Leroy C. Katrinka Blazing, M.D.   DATE OF BIRTH:  January 09, 1946   DATE OF ADMISSION:  DATE OF DISCHARGE:  LH                                HISTORY & PHYSICAL   A 65 year old female referred for colonoscopy.  The patient has a family  history of colon polyps and a questionable family history of colon cancer.  She has had guaiac positive stools.  She was referred for colonoscopy about  three years ago but failed to followup.  She has had a history of anemia.  Her anemia has been stable over the past year.  She will have a screening  colonoscopy because of increased risk based on family history and guaiac  positive stool.   PAST HISTORY:  1.  Hypertension.  2.  History of TIAs.  3.  Hyperlipidemia.  4.  Diabetes mellitus.  5.  Osteoarthritis.   MEDICATIONS:  1.  Cardizem LA 360 mg every day.  2.  Prinizide 20/25 every day.  3.  Antivert 25 mg q.6h. p.r.n.  4.  Lipitor 10 mg q.h.s.   ALLERGIES:  None.   FAMILY HISTORY:  Father for questionable colon cancer.  Kidney disease,  diabetes mellitus, breast cancer, hypertension.   PHYSICAL EXAMINATION:  VITAL SIGNS:  Blood pressure 120/80, pulse 80,  respirations 18, weight 195 pounds.  HEENT:  Unremarkable.  NECK:  Supple.  No JVD or bruits.  CHEST:  Clear to auscultation.  No rales, rubs, rhonchi, or wheezes.  HEART:  Regular rate and rhythm without murmur, gallop or rub.  BREASTS:  Normal skin.  No nipple discharge or tenderness.  No palpable  masses.  No tenderness.  Axilla normal bilaterally without adenopathy.  ABDOMEN:  Soft, nontender, no masses.  RECTAL:  Normal.  No masses.  Normal sphincter tone.  Stool guaiac negative.  EXTREMITIES:  No cyanosis, clubbing, or edema.  NEUROLOGIC:  No focal motor, sensory, or cerebellar deficit.   IMPRESSION:  1.  Prior history of guaiac positive stool with family history of colon      polyps and questionable family history of colon cancer.  2.  Hypertension.  3.  Diabetes mellitus.  4.  Hyperlipidemia.  5.  Osteoarthritis.  6.  Anemia.   PLAN:  The patient will have a screening colonoscopy because of increased  risk.     Lero   LCS/MEDQ  D:  04/21/2004  T:  04/21/2004  Job:  [id number]

## 2010-10-28 NOTE — H&P (Signed)
[facility]  Patient:    Samantha [name], MCKERCHER Visit Number: [id number] MRN: [id number]          Service Type: MED Location: 3A P329 [id number] Attending Physician:  Evlyn Courier Dictated by:   Mirna Mires, M.D. 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  Chief complaints are increased thirst, increased frequency of urination, and lack of energy.  The patient was seen in the office on the day of admission for evaluation of these symptoms.  The lab notified me on the day of admission that her blood sugar was greater than 800. She also complained of blurred vision.  She denied carbuncle, weight loss, vaginal itching, vaginal discharge, stomach pain, nausea, or vomiting.  PAST MEDICAL HISTORY:  Positive for hypertension and hyperlipidemia.  Medical history is negative for tuberculosis, cancer, sickle cell, asthma, or seizure disorder.  According to the patient, she was treated in the office with insulin, subcutaneous by Dr. Sherrie Mustache, and started on Metaglip.  Sexually transmitted disease history is negative for gonorrhea, syphilis, herpes, and HIV infection.  ALLERGIES:  Patient is not allergic to any known medications.  HABITS:  Negative for tobacco, ethanol, and street drugs.  PAST MEDICAL HISTORY:  Positive hospitalization for pregnancy.  PAST SURGICAL HISTORY:  Positive for bilateral tubal ligation at age 8.  FAMILY HISTORY:  Mother living age 30 with history of diabetes and breast cancer; father deceased age 98 secondary to complications of end-stage renal disease; three sisters living, age 91 with a history of hypertension, age 68 in good health, and age 71 with a history of hypertension.  One son living, age 40, in good health.  Two daughters living, age 32  and 29, in good health.  REVIEW OF SYSTEMS:  Negative for chronic headache, epistaxis, bleeding gum, dysphagia, shortness of breath, chronic cough, wheezing, lumps in breasts, discharge from breasts, chest pain, syncope, dizziness, abdominal pain, hematemesis, melena, diarrhea, constipation, edema of leg, leg ulcers, unexplained fever, etc.  Last menstrual period was age 49.  PHYSICAL EXAMINATION:  VITAL SIGNS:  Temperature 98.0, pulse 93, respirations 20, blood pressure 164/98.  GENERAL APPEARANCE:  A middle-aged, overweight, medium height, alert black female in no apparent respiratory distress.  SKIN:  Warm and dry.  HEENT:  Head normocephalic.  Ears - normal auricles.  External canals patent. Tympanic membranes pearly gray.  Eyes - lids negative for ptosis, sclerae white, pupils round, equal, and reactive to light.  Extraocular movement intact.  Nose - negative discharge.  Mouth - positive upper dentures.  Few teeth at bottom in fair condition.  No bleeding gum.  Posterior pharynx benign.  NECK:  Negative for lymphadenopathy or thyromegaly.  LUNGS:  Clear.  HEART:  Audible S1, S2, without murmur, rub, or gallop, a regular rate and rhythm.  BREASTS:  No skin changes.  No nodule on palpation.  Nipple erect without discharge.  ABDOMEN:  Obese, positive for old healed umbilical surgical scar, soft, nontender all four quadrants.  No palpable masses, no organomegaly.  PELVIC:  Deferred.  RECTAL:  No external lesions.  Digital exam:  Positive for stool in rectal vault.  No rectal vault masses.  Stool guaiac negative.  EXTREMITIES:  No edema, no joint swelling, no joint redness, no joint hotness.  NEUROLOGIC:  Alert and oriented to person, place, and time.  Cranial nerves 2-12 appeared intact.  IMPRESSION:  New onset of diabetes mellitus.  SECONDARY DIAGNOSES: 1. Hypertension. 2. Hyperlipidemia.  PLAN:  IV fluids, normal saline at 200 cc per hour.  Labs, CBC,  hemoglobin A1C, MET 7, fasting lipid profile, liver function tests, urinalysis, dietician consult, Humulin R sliding scale.  Diet - 4 g sodium, low cholesterol, 1600 ADA.  EKG, chest x-ray.  MEDICATIONS: 1. One aspirin 81 mg p.o. every day. 2. Lisinopril/HCTZ 20/12.5 p.o. every day. 3. Lipitor 10 mg p.o. every day. Dictated by:   Mirna Mires, M.D. Attending Physician:  Evlyn Courier DD:  12/31/01 TD:  12/31/01 Job: [id number]

## 2010-10-28 NOTE — Discharge Summary (Signed)
Samantha [name], Samantha [name]                             ACCOUNT NO.:  [id number]   MEDICAL RECORD NO.:  [id number]                   PATIENT TYPE:  INP   LOCATION:  A307                                 FACILITY:  [facility]   PHYSICIAN:  Annia Friendly. Loleta Chance, M.D.                DATE OF BIRTH:  [birth year]   DATE OF ADMISSION:  12/31/2001  DATE OF DISCHARGE:  01/03/2002                                 DISCHARGE SUMMARY   HISTORY OF PRESENT ILLNESS:  The patient is a 65 year old gravida 3, para 3,  abortion 0, black female from [place], [place]. Her chief  complaints were increased thirst, increased frequency of urination and lack  of energy.  The patient was seen in the office on the day of admission for  evaluation of these symptoms.  The laboratory notified me on the day of  admission that her blood sugar was greater than 800.  She also complained of  blurred vision.  She denied skin infection, weight loss, vaginal itching,  vaginal discharge, stomach pain, nausea and vomiting.   PAST MEDICAL HISTORY:  Her medical history is positive for hypertension and  hyperlipidemia.  Past medical history positive for hospitalizations for  pregnancy.   ALLERGIES:  The patient is allergic to no medications.   HABITS:  Negative for tobacco, ethanol or street drugs.   FAMILY HISTORY:  Mother living at age 50 with a history of diabetes mellitus  and breast cancer; father deceased at age 33 secondary to complications from  end-stage renal disease; three sisters living, age 76 with a history of  hypertension, age 17 in good health, and age 13 with a history of  hypertension; one son living at age 66 in good health; two daughters living,  ages 93 and 18, in good health.   PHYSICAL EXAMINATION:  GENERAL APPEARANCE:  Physical examination reveals a  middle-aged, overweight, medium height, alert black female in no apparent  respiratory distress.  SKIN:  Warm and dry.  Skin is negative for lesions.  LUNGS:   Lungs were clear.  HEART:  Heart revealed audible S1, S2 without murmur, rub or gallop.  Rhythm  was regular and rate within normal limits.  ABDOMEN:  Abdomen is obese and positive for an old healed umbilical surgical  scar.  Abdomen is soft, nontender in all four quadrants.  No palpable masses  or organomegaly.  EXTREMITIES:      Extremities demonstrate no edema, no joint swelling, no  joint redness, no joint hotness.  The patient was neurologically intact.   LABORATORY AND ACCESSORY DATA:  New onset diabetes mellitus.  Significant  labs on admission were as follows:  White count 8.8, hemoglobin 13.1,  hematocrit 39.5.  Sodium 126, potassium 3.3, chloride 89, serum glucose 431,  BUN 15, creatinine 1.3, calcium 9.7, total protein of 7.4.  Albumin 4.1.  AST 21.  ALT 26.  ALP  125.  Total bilirubin 0.7.  Hemoglobin A1C was 12.5%  (normal 4.6 to 6.5).  Urinalysis (specific gravity of 1.015) revealed pH of  5.0, glucose greater than 1000, no hemoglobin, no bilirubin, no ketones, no  protein, nitrate positive and may bacteria.  It should be noted that the  patient was given insulin in the office by Dr. Sherrie Mustache.  Therefore, this  would account for the discrepancy in the outpatient and inpatient labs.   HOSPITAL COURSE:   PROBLEM #1:  The patient was treated with intravenous fluids using normal  saline, Humulin-R sliding scale, dietitian consultation, diabetic teaching,  Accu-Chek AC and bedtime, and diet 1600 ADA, 4 gram sodium, low cholesterol.  Blood sugar was declining at the end of the hospitalization.  Her last Accu-  Chek revealed a value of 245 on January 03, 2002 at 1100.   The patient was alert and oriented to person, place and time.  She was not  complaining about increased thirst, increased frequency of urination,  abdominal pain, nausea, vomiting, blurred vision or vaginal itching.  She  will follow-up with her family doctor for adjustment of her medications.  She was on oral  hypoglycemic agents at the time of discharge (Glucotrol-XL  5, Glucophage 500 mg, and Avandia 4 mg).  Also, an outpatient appointment  will be made with the ophthalmologist and her blood sugar was brought also  under control.  The patient will follow-up with her dietitian as an  outpatient as part of management for her diabetes. The patient was  discharged to her home, where she lives with her husband, on 01/03/02.   PROBLEM #2:  Hypertension.  Blood pressure on admission was 164/98.  Lungs  were clear on examination.  Heart revealed audible S1 and S2 without murmur,  rub or gallop.  Her extremities demonstrated no edema.  The patient was  treated with lisinopril-HCT 20/12.5 one tablet p.o. every day and a low-  sodium diet. Her blood pressure was managed during this hospitalization  without problem.  Her blood pressure on the day of discharge was 106/74.  Pulse was regular.  The patient had no complaint of palpitations, dizziness,  diaphoresis, shortness of breath, or chest pain.   PROBLEM #3:  Hyperlipidemia.  Fasting lipid profile during this  hospitalization demonstrated the following on 01/01/02 at 0655:  Total  cholesterol 153 mg/dl, triglycerides 161 mg/dl, high-density lipoprotein 61  mg/dl, low-density lipoprotein 7 mg/dl.  The patient was already on Lipitor  10 mg p.o. every day.  Therefore, she was continued on it during this  hospitalization.   Liver function tests revealed the following during this hospitalization:  AST 21, ALT 26, ALP 125, total bilirubin 0.7.  Liver function tests will be  monitored periodically as an outpatient.   PROBLEM #4:  Acute urinary tract infection secondary to Escherichia coli.  Urinalysis on admission reflected possible infection.  Urine culture was  ordered.  The urine culture grew greater than 100,000 Escherichia coli.  Therefore, the patient was discharged home on Levaquin 250 mg p.o. every  day.  PROBLEM #5:  Electrolyte imbalance.  The  serum electrolytes on admission  were as follows:  Sodium 126, potassium 3.3, chloride 89, carbon dioxide 30.  The patient was treated with normal saline, intravenous fluids and potassium  supplementation.  The last electrolytes were collected on 01/02/02 and  demonstrated the following:  Sodium 133, potassium 4.3, chloride 103, carbon  dioxide 31.  The patient was not complaining of any weakness, dizziness  or  fatigue at the time of discharge.  She also was not complaining of any  nausea, vomiting or diarrhea.   DISCHARGE INSTRUCTIONS:  Instructions at the time of discharge were as  follows:  1. Diet - low-salt, low-cholesterol, 1600-calorie ADA.  2. Activities - no restriction.  3. Medications - Glucotrol-XL 5 mg one tablet p.o. 30 minutes before meals     and one tablet p.o. 30 minutes before supper, Glucophage 500 mg one     tablet twice a day (with breakfast and supper), Avandia 4 mg one tablet     p.o. every morning, one aspirin 81 mg p.o. every day, lisinopril-HCT     20/12.5 mg one tablet p.o. every day, Lipitor 10 mg one tablet p.o. at     bedtime, Levaquin 250 mg one tablet p.o. daily times three days.  4. Follow-up - follow-up with Dr. Sherrie Mustache on January 10, 2002 at the office.  5. Patient to do Accu-Chek at 7:00 a.m., 5:00 p.m. and 10:00 p.m. every day.     A Glucometer was ordered for the patient during this hospitalization and     she received diabetic teaching, as mentioned earlier.   PRIMARY DIAGNOSES:  1. New onset diabetes mellitus.  2. Acute urinary tract infection secondary to Escherichia coli.  3. Electrolyte imbalance secondary to number one.   SECONDARY DIAGNOSES:  1. Hypertension.  2. Hyperlipidemia.                                               Annia Friendly. Loleta Chance, M.D.    Levonne Hubert  D:  01/05/2002  T:  01/12/2002  Job:  [id number]   cc:   Annia Friendly. Loleta Chance, M.D.

## 2011-10-17 ENCOUNTER — Other Ambulatory Visit (HOSPITAL_COMMUNITY): Payer: Self-pay | Admitting: Family Medicine

## 2011-10-17 DIAGNOSIS — Z139 Encounter for screening, unspecified: Secondary | ICD-10-CM

## 2011-10-19 ENCOUNTER — Ambulatory Visit (HOSPITAL_COMMUNITY): Payer: Self-pay

## 2011-11-07 ENCOUNTER — Ambulatory Visit (HOSPITAL_COMMUNITY): Payer: Self-pay

## 2011-11-09 ENCOUNTER — Ambulatory Visit (HOSPITAL_COMMUNITY): Payer: Self-pay

## 2011-11-13 ENCOUNTER — Ambulatory Visit (HOSPITAL_COMMUNITY)
Admission: RE | Admit: 2011-11-13 | Discharge: 2011-11-13 | Disposition: A | Discharge status: Home / Self Care | Payer: Medicare Other | Source: Ambulatory Visit | Attending: Family Medicine | Admitting: Family Medicine

## 2011-11-13 DIAGNOSIS — Z139 Encounter for screening, unspecified: Secondary | ICD-10-CM

## 2011-11-13 DIAGNOSIS — Z1231 Encounter for screening mammogram for malignant neoplasm of breast: Secondary | ICD-10-CM | POA: Insufficient documentation

## 2011-11-14 ENCOUNTER — Encounter (HOSPITAL_COMMUNITY): Payer: Self-pay | Admitting: Emergency Medicine

## 2011-11-14 ENCOUNTER — Emergency Department (HOSPITAL_COMMUNITY): Payer: Medicare Other

## 2011-11-14 ENCOUNTER — Emergency Department (HOSPITAL_COMMUNITY)
Admission: EM | Admit: 2011-11-14 | Discharge: 2011-11-14 | Disposition: A | Discharge status: Home / Self Care | Payer: Medicare Other | Attending: Emergency Medicine | Admitting: Emergency Medicine

## 2011-11-14 DIAGNOSIS — E119 Type 2 diabetes mellitus without complications: Secondary | ICD-10-CM | POA: Insufficient documentation

## 2011-11-14 DIAGNOSIS — R42 Dizziness and giddiness: Secondary | ICD-10-CM | POA: Insufficient documentation

## 2011-11-14 DIAGNOSIS — R209 Unspecified disturbances of skin sensation: Secondary | ICD-10-CM | POA: Insufficient documentation

## 2011-11-14 DIAGNOSIS — I10 Essential (primary) hypertension: Secondary | ICD-10-CM | POA: Insufficient documentation

## 2011-11-14 HISTORY — DX: Essential (primary) hypertension: I10

## 2011-11-14 HISTORY — DX: Hyperlipidemia, unspecified: E78.5

## 2011-11-14 LAB — CBC
Hemoglobin: 11.4 g/dL — ABNORMAL LOW (ref 12.0–15.0)
MCH: 26 pg (ref 26.0–34.0)
MCV: 82.7 fL (ref 78.0–100.0)
RBC: 4.39 MIL/uL (ref 3.87–5.11)

## 2011-11-14 LAB — COMPREHENSIVE METABOLIC PANEL
BUN: 14 mg/dL (ref 6–23)
Calcium: 9.8 mg/dL (ref 8.4–10.5)
Creatinine, Ser: 1.07 mg/dL (ref 0.50–1.10)
GFR calc Af Amer: 62 mL/min — ABNORMAL LOW (ref >90)
Glucose, Bld: 114 mg/dL — ABNORMAL HIGH (ref 70–99)
Total Protein: 8 g/dL (ref 6.0–8.3)

## 2011-11-14 LAB — URINALYSIS, ROUTINE W REFLEX MICROSCOPIC
Ketones, ur: NEGATIVE mg/dL
Leukocytes, UA: NEGATIVE
Nitrite: NEGATIVE
Specific Gravity, Urine: 1.025 (ref 1.005–1.030)
pH: 5.5 (ref 5.0–8.0)

## 2011-11-14 LAB — DIFFERENTIAL
Eosinophils Absolute: 0.1 10*3/uL (ref 0.0–0.7)
Eosinophils Relative: 2 % (ref 0–5)
Lymphs Abs: 3.2 10*3/uL (ref 0.7–4.0)
Monocytes Relative: 6 % (ref 3–12)

## 2011-11-14 MED ORDER — MECLIZINE HCL 12.5 MG PO TABS
25.0000 mg | ORAL_TABLET | Freq: Once | ORAL | Status: AC
  Administered 2011-11-14: 25 mg via ORAL
  Filled 2011-11-14: qty 2

## 2011-11-14 MED ORDER — MECLIZINE HCL 25 MG PO TABS: 25.0000 mg | ORAL_TABLET | Freq: Four times a day (QID) | ORAL | Status: AC

## 2011-11-14 NOTE — ED Notes (Signed)
Patient stated she bent over this morning and became dizzy. Happened again this afternoon. Also complaining of left hand tingling today.

## 2011-11-14 NOTE — Discharge Instructions (Signed)
Samantha [name], you had physical examination, laboratory tests, and CT x-ray of the brain to check on you for dizziness and a tingling feeling in the left hand that you have experienced today.  Fortunately, your tests were good.  It is safe to go home.  You can take the medicine Antivert (meclizine) 25 mg by mouth if needed for dizziness. A report of your visit will be sent to Renaye Rakers, M.D., your family physician.

## 2011-11-14 NOTE — ED Provider Notes (Signed)
History     CSN: [id number]  Arrival date & time 11/14/11  2029   First MD Initiated Contact with Patient 11/14/11 2050      Chief Complaint  Patient presents with  . Dizziness  . Tingling    (Consider location/radiation/quality/duration/timing/severity/associated sxs/prior treatment) HPI Comments: Patient is a 66 year old woman who says she got dizzy today. She had bent over, and when she stood up she was dizzy. She's had intermittent episodes since that time. She went to the grocery store and felt dizzy, was mildly disoriented, and also had some tingling in her left hand. She therefore sought evaluation.  She was concerned about the possibility of a stroke.  Patient is a 66 y.o. female presenting with neurologic complaint.  Neurologic Problem The primary symptoms include dizziness. Primary symptoms do not include fever. The symptoms began 6 to 12 hours ago. The symptoms are improving. The neurological symptoms are focal. Context: Onset after bending over and straightening up.  Dizziness does not occur with tinnitus or weakness.  Additional symptoms include vertigo. Additional symptoms do not include neck stiffness, weakness, loss of balance, photophobia, hearing loss or tinnitus. Medical issues do not include cerebral vascular accident. Associated medical issues comments: She is taking medications for diabetes and hypertension..    Past Medical History  Diagnosis Date  . Hypertension   . Diabetes mellitus   . Hyperlipidemia     History reviewed. No pertinent past surgical history.  History reviewed. No pertinent family history.  History  Substance Use Topics  . Smoking status: Never Smoker   . Smokeless tobacco: Not on file  . Alcohol Use: No    OB History    Grav Para Term Preterm Abortions TAB SAB Ect Mult Living                  Review of Systems  Constitutional: Negative.  Negative for fever and chills.  HENT: Negative for hearing loss, neck stiffness and  tinnitus. Sore throat: transient sore throat today.   Eyes: Negative.  Negative for photophobia.  Respiratory: Negative.   Cardiovascular: Negative.   Gastrointestinal: Negative.   Genitourinary: Negative.   Musculoskeletal: Negative.   Skin: Negative.   Neurological: Positive for dizziness and vertigo. Negative for weakness and loss of balance.       Tingling in left hand.  Psychiatric/Behavioral: Negative.     Allergies  Review of patient's allergies indicates no known allergies.  Home Medications  No current outpatient prescriptions on file.  BP 152/83  Pulse 65  Temp(Src) 97.7 F (36.5 C) (Oral)  Resp 18  Ht 5\' 1"  (1.549 m)  Wt 200 lb (90.719 kg)  BMI 37.79 kg/m2  SpO2 100%  Physical Exam  Nursing note and vitals reviewed. Constitutional: She is oriented to person, place, and time. She appears well-developed and well-nourished. No distress (No nystagmus.).  HENT:  Head: Normocephalic and atraumatic.  Right Ear: External ear normal.  Left Ear: External ear normal.  Mouth/Throat: Oropharynx is clear and moist.  Eyes: Conjunctivae and EOM are normal. Pupils are equal, round, and reactive to light.  Neck: Normal range of motion. Neck supple.       No carotid bruits.  Cardiovascular: Normal rate, regular rhythm and normal heart sounds.   Pulmonary/Chest: Effort normal and breath sounds normal.  Abdominal: Soft. Bowel sounds are normal.  Musculoskeletal: Normal range of motion. She exhibits no edema and no tenderness.  Neurological: She is alert and oriented to person, place, and time.  No sensory or motor deficit.  Skin: Skin is warm and dry.  Psychiatric: She has a normal mood and affect. Her behavior is normal.    ED Course  Procedures (including critical care time)   Labs Reviewed  CBC  DIFFERENTIAL  COMPREHENSIVE METABOLIC PANEL  URINALYSIS, ROUTINE W REFLEX MICROSCOPIC   9:11 PM Pt seen --> physical exam performed.  Lab workup and Head CT  ordered.  9:53 PM CT of head is negative.  Waiting for lab results.   10:14 PM Results for orders placed during the hospital encounter of 11/14/11  CBC      Component Value Range   WBC 6.8  4.0 - 10.5 (K/uL)   RBC 4.39  3.87 - 5.11 (MIL/uL)   Hemoglobin 11.4 (*) 12.0 - 15.0 (g/dL)   HCT 16.1  09.6 - 04.5 (%)   MCV 82.7  78.0 - 100.0 (fL)   MCH 26.0  26.0 - 34.0 (pg)   MCHC 31.4  30.0 - 36.0 (g/dL)   RDW 40.9  81.1 - 91.4 (%)   Platelets 234  150 - 400 (K/uL)  DIFFERENTIAL      Component Value Range   Neutrophils Relative 45  43 - 77 (%)   Neutro Abs 3.1  1.7 - 7.7 (K/uL)   Lymphocytes Relative 47 (*) 12 - 46 (%)   Lymphs Abs 3.2  0.7 - 4.0 (K/uL)   Monocytes Relative 6  3 - 12 (%)   Monocytes Absolute 0.4  0.1 - 1.0 (K/uL)   Eosinophils Relative 2  0 - 5 (%)   Eosinophils Absolute 0.1  0.0 - 0.7 (K/uL)   Basophils Relative 0  0 - 1 (%)   Basophils Absolute 0.0  0.0 - 0.1 (K/uL)  COMPREHENSIVE METABOLIC PANEL      Component Value Range   Sodium 140  135 - 145 (mEq/L)   Potassium 3.4 (*) 3.5 - 5.1 (mEq/L)   Chloride 102  96 - 112 (mEq/L)   CO2 25  19 - 32 (mEq/L)   Glucose, Bld 114 (*) 70 - 99 (mg/dL)   BUN 14  6 - 23 (mg/dL)   Creatinine, Ser 7.82  0.50 - 1.10 (mg/dL)   Calcium 9.8  8.4 - 95.6 (mg/dL)   Total Protein 8.0  6.0 - 8.3 (g/dL)   Albumin 4.1  3.5 - 5.2 (g/dL)   AST 20  0 - 37 (U/L)   ALT 18  0 - 35 (U/L)   Alkaline Phosphatase 97  39 - 117 (U/L)   Total Bilirubin 0.5  0.3 - 1.2 (mg/dL)   GFR calc non Af Amer 53 (*) >90 (mL/min)   GFR calc Af Amer 62 (*) >90 (mL/min)   Ct Head Wo Contrast  11/14/2011  *RADIOLOGY REPORT*  Clinical Data: Dizziness.  CT HEAD WITHOUT CONTRAST  Technique:  Contiguous axial images were obtained from the base of the skull through the vertex without contrast.  Comparison: No priors.  Findings: No acute intracranial abnormalities.  Specifically, no signs of acute/subacute cerebral ischemia, no acute intracranial hemorrhage, no focal  mass, mass effect, hydrocephalus or abnormal intra or extra-axial fluid collections.  No acute displaced skull fractures are identified.  Visualized paranasal sinuses and mastoids are well pneumatized.  IMPRESSION: 1.  No acute intracranial abnormality.  The appearance the brain is normal.  Original Report Authenticated By: Florencia Reasons, M.D.    Lab workup is negative.  Advised pt to take Antivert 25 mg if needed for dizziness, followup  with her primary care physician, Renaye Rakers, M.D.   1. Vertigo        Carleene Cooper III, MD 11/14/11 2218

## 2012-07-29 ENCOUNTER — Other Ambulatory Visit (HOSPITAL_COMMUNITY): Payer: Self-pay | Admitting: Family Medicine

## 2012-08-07 ENCOUNTER — Ambulatory Visit (HOSPITAL_COMMUNITY)
Admission: RE | Admit: 2012-08-07 | Discharge: 2012-08-07 | Disposition: A | Discharge status: Home / Self Care | Payer: Medicare Other | Source: Ambulatory Visit | Attending: Family Medicine | Admitting: Family Medicine

## 2012-08-07 DIAGNOSIS — N63 Unspecified lump in unspecified breast: Secondary | ICD-10-CM | POA: Insufficient documentation

## 2013-03-31 ENCOUNTER — Encounter (INDEPENDENT_AMBULATORY_CARE_PROVIDER_SITE_OTHER): Payer: Medicare Other | Admitting: Ophthalmology

## 2013-03-31 DIAGNOSIS — H43819 Vitreous degeneration, unspecified eye: Secondary | ICD-10-CM

## 2013-03-31 DIAGNOSIS — H35039 Hypertensive retinopathy, unspecified eye: Secondary | ICD-10-CM

## 2013-03-31 DIAGNOSIS — E1139 Type 2 diabetes mellitus with other diabetic ophthalmic complication: Secondary | ICD-10-CM

## 2013-03-31 DIAGNOSIS — E11319 Type 2 diabetes mellitus with unspecified diabetic retinopathy without macular edema: Secondary | ICD-10-CM

## 2013-03-31 DIAGNOSIS — H33309 Unspecified retinal break, unspecified eye: Secondary | ICD-10-CM

## 2013-03-31 DIAGNOSIS — H2510 Age-related nuclear cataract, unspecified eye: Secondary | ICD-10-CM

## 2013-03-31 DIAGNOSIS — I10 Essential (primary) hypertension: Secondary | ICD-10-CM

## 2013-04-14 ENCOUNTER — Ambulatory Visit (INDEPENDENT_AMBULATORY_CARE_PROVIDER_SITE_OTHER): Payer: Self-pay | Admitting: Ophthalmology

## 2013-04-14 DIAGNOSIS — H33309 Unspecified retinal break, unspecified eye: Secondary | ICD-10-CM

## 2013-05-25 ENCOUNTER — Encounter (HOSPITAL_COMMUNITY): Payer: Self-pay | Admitting: Emergency Medicine

## 2013-05-25 ENCOUNTER — Emergency Department (HOSPITAL_COMMUNITY)
Admission: EM | Admit: 2013-05-25 | Discharge: 2013-05-25 | Disposition: A | Discharge status: Home / Self Care | Payer: Medicare Other | Attending: Emergency Medicine | Admitting: Emergency Medicine

## 2013-05-25 ENCOUNTER — Telehealth (HOSPITAL_COMMUNITY): Payer: Self-pay | Admitting: Emergency Medicine

## 2013-05-25 DIAGNOSIS — M5430 Sciatica, unspecified side: Secondary | ICD-10-CM | POA: Insufficient documentation

## 2013-05-25 DIAGNOSIS — M545 Low back pain, unspecified: Secondary | ICD-10-CM | POA: Insufficient documentation

## 2013-05-25 DIAGNOSIS — Z79899 Other long term (current) drug therapy: Secondary | ICD-10-CM | POA: Insufficient documentation

## 2013-05-25 DIAGNOSIS — M5432 Sciatica, left side: Secondary | ICD-10-CM

## 2013-05-25 DIAGNOSIS — Z792 Long term (current) use of antibiotics: Secondary | ICD-10-CM | POA: Insufficient documentation

## 2013-05-25 DIAGNOSIS — M25559 Pain in unspecified hip: Secondary | ICD-10-CM | POA: Insufficient documentation

## 2013-05-25 DIAGNOSIS — E785 Hyperlipidemia, unspecified: Secondary | ICD-10-CM | POA: Insufficient documentation

## 2013-05-25 DIAGNOSIS — I10 Essential (primary) hypertension: Secondary | ICD-10-CM | POA: Insufficient documentation

## 2013-05-25 DIAGNOSIS — E119 Type 2 diabetes mellitus without complications: Secondary | ICD-10-CM | POA: Insufficient documentation

## 2013-05-25 DIAGNOSIS — Z7982 Long term (current) use of aspirin: Secondary | ICD-10-CM | POA: Insufficient documentation

## 2013-05-25 DIAGNOSIS — M79609 Pain in unspecified limb: Secondary | ICD-10-CM | POA: Insufficient documentation

## 2013-05-25 DIAGNOSIS — N390 Urinary tract infection, site not specified: Secondary | ICD-10-CM | POA: Insufficient documentation

## 2013-05-25 LAB — URINALYSIS, ROUTINE W REFLEX MICROSCOPIC
Bilirubin Urine: NEGATIVE
Nitrite: POSITIVE — AB
Specific Gravity, Urine: >1.03 — ABNORMAL HIGH (ref 1.005–1.030)
Urobilinogen, UA: 0.2 mg/dL (ref 0.0–1.0)

## 2013-05-25 LAB — URINE MICROSCOPIC-ADD ON

## 2013-05-25 MED ORDER — HYDROMORPHONE HCL PF 1 MG/ML IJ SOLN
INTRAMUSCULAR | Status: AC
  Administered 2013-05-25: 1 mg via INTRAMUSCULAR
  Filled 2013-05-25: qty 1

## 2013-05-25 MED ORDER — OXYCODONE-ACETAMINOPHEN 5-325 MG PO TABS: 2.0000 | ORAL_TABLET | ORAL | Status: DC | PRN

## 2013-05-25 MED ORDER — CIPROFLOXACIN HCL 500 MG PO TABS: 500.0000 mg | ORAL_TABLET | Freq: Two times a day (BID) | ORAL | Status: DC

## 2013-05-25 MED ORDER — CEFTRIAXONE SODIUM 1 G IJ SOLR
1.0000 g | Freq: Once | INTRAMUSCULAR | Status: AC
  Administered 2013-05-25: 1 g via INTRAMUSCULAR
  Filled 2013-05-25: qty 10

## 2013-05-25 MED ORDER — HYDROMORPHONE HCL PF 1 MG/ML IJ SOLN
1.0000 mg | Freq: Once | INTRAMUSCULAR | Status: AC
  Administered 2013-05-25: 1 mg via INTRAMUSCULAR

## 2013-05-25 MED ORDER — CYCLOBENZAPRINE HCL 7.5 MG PO TABS: 7.5000 mg | ORAL_TABLET | Freq: Three times a day (TID) | ORAL | Status: DC | PRN

## 2013-05-25 MED ORDER — LIDOCAINE HCL (PF) 1 % IJ SOLN
INTRAMUSCULAR | Status: AC
  Administered 2013-05-25: 2.5 mL
  Filled 2013-05-25: qty 5

## 2013-05-25 MED ORDER — DIAZEPAM 5 MG PO TABS
5.0000 mg | ORAL_TABLET | Freq: Once | ORAL | Status: AC
  Administered 2013-05-25: 5 mg via ORAL
  Filled 2013-05-25: qty 1

## 2013-05-25 NOTE — ED Notes (Signed)
Pharmacy calling because 7.5 mg cyclobenzaprine is too expensive. Would like to change to 5 mg. Per Sharilyn Sites PA-C, change is ok.

## 2013-05-25 NOTE — ED Provider Notes (Signed)
CSN: [id number]     Arrival date & time 05/25/13  0345 History   First MD Initiated Contact with Patient 05/25/13 0510     Chief Complaint  Patient presents with  . Hip Pain  . Leg Pain  . Sciatica   (Consider location/radiation/quality/duration/timing/severity/associated sxs/prior Treatment) HPI History provided by patient. Left-sided low back pain radiating to hip and down leg onset yesterday. No recalled trauma. She does not work and denies any heavy lifting, bending or twisting. Pain is moderate to severe and worse with movement. No associated weakness or numbness. No bowel or bladder incontinence. No fevers or chills. She denies any history of back pain or problems. She is diabetic and is also having some urinary frequency but no dysuria. Tonight unable to sleep having some spasms and cramps in her leg. Currently complains of only pain. Past Medical History  Diagnosis Date  . Hypertension   . Diabetes mellitus   . Hyperlipidemia    History reviewed. No pertinent past surgical history. History reviewed. No pertinent family history. History  Substance Use Topics  . Smoking status: Never Smoker   . Smokeless tobacco: Not on file  . Alcohol Use: No   OB History   Grav Para Term Preterm Abortions TAB SAB Ect Mult Living                 Review of Systems  Constitutional: Negative for fever and chills.  Respiratory: Negative for shortness of breath.   Cardiovascular: Negative for chest pain.  Gastrointestinal: Negative for abdominal pain.  Genitourinary: Positive for frequency. Negative for dysuria, hematuria and flank pain.  Musculoskeletal: Positive for back pain. Negative for neck pain and neck stiffness.  Skin: Negative for rash.  Neurological: Negative for headaches.  All other systems reviewed and are negative.    Allergies  Review of patient's allergies indicates no known allergies.  Home Medications   Current Outpatient Rx  Name  Route  Sig  Dispense  Refill   . amLODipine (NORVASC) 5 MG tablet   Oral   Take 5 mg by mouth 2 (two) times daily.         [name] Kitchen aspirin EC 81 MG tablet   Oral   Take 81 mg by mouth daily.         . cloNIDine (CATAPRES) 0.1 MG tablet   Oral   Take 0.1 mg by mouth 2 (two) times daily.         [name] Kitchen lisinopril-hydrochlorothiazide (PRINZIDE,ZESTORETIC) 20-12.5 MG per tablet   Oral   Take 2 tablets by mouth daily.         [name] Kitchen METFORMIN HCL PO   Oral   Take 1 tablet by mouth 2 (two) times daily.          . potassium chloride SA (K-DUR,KLOR-CON) 20 MEQ tablet   Oral   Take 20 mEq by mouth daily.         . Atorvastatin Calcium (LIPITOR PO)   Oral   Take 1 tablet by mouth daily.         . ciprofloxacin (CIPRO) 500 MG tablet   Oral   Take 1 tablet (500 mg total) by mouth 2 (two) times daily.   14 tablet   0   . cyclobenzaprine (FEXMID) 7.5 MG tablet   Oral   Take 1 tablet (7.5 mg total) by mouth 3 (three) times daily as needed for muscle spasms.   30 tablet   0   . Diltiazem HCl Coated Beads (  MATZIM LA PO)   Oral   Take 1 capsule by mouth daily.         [name] Kitchen LISINOPRIL PO   Oral   Take 2 tablets by mouth daily.         [name] Kitchen oxyCODONE-acetaminophen (PERCOCET/ROXICET) 5-325 MG per tablet   Oral   Take 2 tablets by mouth every 4 (four) hours as needed for severe pain.   15 tablet   0    BP 139/70  Pulse 65  Temp(Src) 98.1 F (36.7 C) (Oral)  Resp 16  Ht 5\' 1"  (1.549 m)  Wt 195 lb (88.451 kg)  BMI 36.86 kg/m2  SpO2 97% Physical Exam  Constitutional: She is oriented to person, place, and time. She appears well-developed and well-nourished.  HENT:  Head: Normocephalic and atraumatic.  Eyes: EOM are normal. Pupils are equal, round, and reactive to light.  Neck: Normal range of motion. Neck supple.  Cardiovascular: Normal rate, regular rhythm and intact distal pulses.   Pulmonary/Chest: Effort normal and breath sounds normal. No respiratory distress.  Abdominal: Soft. She exhibits no  distension. There is no tenderness.  Musculoskeletal: Normal range of motion. She exhibits no edema.  Tender over area of left sciatica, decreased range of motion left lower extremity secondary to pain. Equal DTRs. Sensorium to light touch equal and intact distally. No tenderness over lumbar spine. No tenderness over the thoracic spine.   Neurological: She is alert and oriented to person, place, and time.  Skin: Skin is warm and dry.    ED Course  Procedures (including critical care time) Labs Review Results for orders placed during the hospital encounter of 05/25/13  URINALYSIS, ROUTINE W REFLEX MICROSCOPIC      Result Value Range   Color, Urine YELLOW  YELLOW   APPearance CLEAR  CLEAR   Specific Gravity, Urine >1.030 (*) 1.005 - 1.030   pH 6.0  5.0 - 8.0   Glucose, UA NEGATIVE  NEGATIVE mg/dL   Hgb urine dipstick NEGATIVE  NEGATIVE   Bilirubin Urine NEGATIVE  NEGATIVE   Ketones, ur NEGATIVE  NEGATIVE mg/dL   Protein, ur NEGATIVE  NEGATIVE mg/dL   Urobilinogen, UA 0.2  0.0 - 1.0 mg/dL   Nitrite POSITIVE (*) NEGATIVE   Leukocytes, UA TRACE (*) NEGATIVE  URINE MICROSCOPIC-ADD ON      Result Value Range   WBC, UA TOO NUMEROUS TO COUNT  <3 WBC/hpf   RBC / HPF 0-2  <3 RBC/hpf   Bacteria, UA MANY (*) RARE    7:08 AM back pain is significantly better after medications. UA reviewed as above and antibiotics initiated.   Patient is comfortable with plan, discharge home follow primary care physician. She'll take medications as prescribed - Flexeril, Cipro, Percocet.   Back pain precautions provided. Reliable historian - agrees to all discharge and followup instructions MDM   1. Sciatica of left side   2. UTI (lower urinary tract infection)    Pain improved with IM narcotics and Valium Treated for UTI with urine culture pending Condition improved  Vital signs nurse's notes reviewed and considered    Sunnie Nielsen, MD 05/26/13 [phone]

## 2013-05-25 NOTE — ED Notes (Signed)
Pain in L and R leg began suddenly last night.  She took Tylenol and Percocet w/slight relief.  Now pain is primarily in L leg. No known injury.  Denies SOB or CP.  R pedal pulse 2+, leg warm and dry w/out edema.

## 2013-05-25 NOTE — ED Notes (Signed)
Hurting in left hip and leg, all the way down. Had a bad cramp in my left leg Friday night and it started after that.

## 2013-05-27 LAB — URINE CULTURE

## 2013-05-27 NOTE — Progress Notes (Signed)
ED Antimicrobial Stewardship Positive Culture Follow Up   Samantha [name] is an 67 y.o. female who presented to [facility] on 05/25/2013 with a chief complaint of  Chief Complaint  Patient presents with  . Hip Pain  . Leg Pain  . Sciatica    Recent Results (from the past 720 hour(s))  URINE CULTURE     Status: None   Collection Time    05/25/13  6:16 AM      Result Value Range Status   Specimen Description URINE, CLEAN CATCH   Final   Special Requests NONE   Final   Culture  Setup Time     Final   Value: 05/25/2013 21:35     Performed at [facility] Count     Final   Value: >=100,000 COLONIES/ML     Performed at [facility]   Culture     Final   Value: ESCHERICHIA COLI     Performed at [facility]   Report Status 05/27/2013 FINAL   Final   Organism ID, Bacteria ESCHERICHIA COLI   Final    [x]  Treated with ciprofloxacin, organism resistant to prescribed antimicrobial []  Patient discharged originally without antimicrobial agent and treatment is now indicated  New antibiotic prescription: Bactrim DS 1 tablet BID x 3 days  ED Provider: Jaynie Crumble, PA-C   Mickeal Skinner 05/27/2013, 9:58 AM Infectious Diseases Pharmacist Phone# [phone]

## 2013-05-27 NOTE — ED Notes (Signed)
Post ED Visit - Positive Culture Follow-up: Successful Patient Follow-Up  Culture assessed and recommendations reviewed by: []  Wes Dulaney, Pharm.D., BCPS [x]  Celedonio Miyamoto, Pharm.D., BCPS []  Georgina Pillion, Pharm.D., BCPS []  [place], [address].D., BCPS, AAHIVP []  Estella Husk, Pharm.D., BCPS, AAHIVP  Positive urine culture  []  Patient discharged without antimicrobial prescription and treatment is now indicated [x]  Organism is resistant to prescribed ED discharge antimicrobial []  Patient with positive blood cultures  Changes discussed with ED provider: Lemont Fillers New antibiotic prescription Bactrim DS   Larena Sox 05/27/2013, 10:48 PM

## 2013-05-29 ENCOUNTER — Ambulatory Visit (INDEPENDENT_AMBULATORY_CARE_PROVIDER_SITE_OTHER): Payer: Medicare Other

## 2013-05-29 ENCOUNTER — Encounter (HOSPITAL_COMMUNITY): Payer: Self-pay | Admitting: Emergency Medicine

## 2013-05-29 ENCOUNTER — Emergency Department (HOSPITAL_COMMUNITY): Payer: Medicare Other

## 2013-05-29 ENCOUNTER — Ambulatory Visit (INDEPENDENT_AMBULATORY_CARE_PROVIDER_SITE_OTHER): Payer: Medicare Other | Admitting: Orthopedic Surgery

## 2013-05-29 ENCOUNTER — Emergency Department (HOSPITAL_COMMUNITY)
Admission: EM | Admit: 2013-05-29 | Discharge: 2013-05-29 | Disposition: A | Discharge status: Home / Self Care | Payer: Medicare Other | Attending: Emergency Medicine | Admitting: Emergency Medicine

## 2013-05-29 VITALS — BP 149/94 | Ht 61.0 in | Wt 195.0 lb

## 2013-05-29 DIAGNOSIS — I10 Essential (primary) hypertension: Secondary | ICD-10-CM | POA: Insufficient documentation

## 2013-05-29 DIAGNOSIS — E785 Hyperlipidemia, unspecified: Secondary | ICD-10-CM | POA: Insufficient documentation

## 2013-05-29 DIAGNOSIS — M25562 Pain in left knee: Secondary | ICD-10-CM

## 2013-05-29 DIAGNOSIS — E119 Type 2 diabetes mellitus without complications: Secondary | ICD-10-CM | POA: Insufficient documentation

## 2013-05-29 DIAGNOSIS — Z7982 Long term (current) use of aspirin: Secondary | ICD-10-CM | POA: Insufficient documentation

## 2013-05-29 DIAGNOSIS — IMO0002 Reserved for concepts with insufficient information to code with codable children: Secondary | ICD-10-CM | POA: Insufficient documentation

## 2013-05-29 DIAGNOSIS — M25569 Pain in unspecified knee: Secondary | ICD-10-CM

## 2013-05-29 DIAGNOSIS — Z79899 Other long term (current) drug therapy: Secondary | ICD-10-CM | POA: Insufficient documentation

## 2013-05-29 DIAGNOSIS — Z792 Long term (current) use of antibiotics: Secondary | ICD-10-CM | POA: Insufficient documentation

## 2013-05-29 DIAGNOSIS — M5417 Radiculopathy, lumbosacral region: Secondary | ICD-10-CM

## 2013-05-29 MED ORDER — OXYCODONE-ACETAMINOPHEN 5-325 MG PO TABS: 1.0000 | ORAL_TABLET | ORAL | Status: DC | PRN

## 2013-05-29 MED ORDER — NAPROXEN 500 MG PO TABS: 500.0000 mg | ORAL_TABLET | Freq: Two times a day (BID) | ORAL | Status: DC

## 2013-05-29 NOTE — Patient Instructions (Signed)
Sending to er for [facility]

## 2013-05-29 NOTE — ED Notes (Signed)
Pain lt low back and down lt leg, seen here last week and dx with sciatica.Pt says that her MD wants her to have an MRI

## 2013-05-29 NOTE — Progress Notes (Signed)
Patient ID: Samantha [name], female   DOB: 04-29-1946, 67 y.o.   MRN: [id number]  Chief Complaint  Patient presents with  . Hip Pain    Left hip and leg pain radiating down leg    HISTORY:  67 rolled female with no history of lumbar spine complaints went to the emergency room at [facility] hospital on December 12 for acute onset left leg pain and difficulty ambulating. She was given an intramuscular shot and sent home he was diagnosed with a urinary tract infection which is currently being treated with Cipro. She was referred here for followup. She's complaining of sharp throbbing 10 out of 10 constant pain unrelieved by hydrocodone or Percocet associated with weakness of her left lower extremity and a feeling that the left leg is heavy and numb the pain radiates from her left lumbar spine down into her left foot  Excessive urination noted from her UTI otherwise her review of systems is normal  No allergies  Hypertension diabetes  Medications are lisinopril clonidine metformin ZETIA aspirin Cipro  Family history cancer and diabetes  Social history no smoking or drinking  Upper extremity exam  The right and left upper extremity:   Inspection and palpation revealed no abnormalities in the upper extremities.   Range of motion is full without contracture.  Motor exam is normal with grade 5 strength.  The joints are fully reduced without subluxation.  There is no atrophy or tremor and muscle tone is normal.  All joints are stable.   Her right lower extremity inspection is normal range of motion is full motor exam is normal the joints are reduced without subluxation there is no atrophy  The left lower extremity is held in flexion she can straighten her leg without severe radicular symptoms there is no weakness however the reflexes show a great difference her joints are reduced without subluxation there is no atrophy range of motion assessment wasn't possible she was nontender in the leg but  she was tender in the lower back  The x-ray was taken in our office show spondylosis and spondylolisthesis of L4-5 and L5-S1  Diagnoses spondylosis lumbar spine Spondylolisthesis lumbar spine Acute herniation of the lumbar disc with leg weakness and severe pain  I sent the patient back to the emergency room so she can have an emergent MRI and be sent to appropriate specialist

## 2013-05-29 NOTE — ED Provider Notes (Signed)
CSN: [id number]     Arrival date & time 05/29/13  1121 History   First MD Initiated Contact with Patient 05/29/13 1136     Chief Complaint  Patient presents with  . Back Pain   (Consider location/radiation/quality/duration/timing/severity/associated sxs/prior Treatment) HPI Samantha [name] is a 67 y.o. female who presents to ED with complaint of back pain. She states that her back has been hurting her for several weeks. She was seen here last week and diagnosed with possible sciatica. She was referred to an orthopedic specialist. She did go see an orthopedic specialist who sent her here for an MRI. Patient denies any numbness or weakness in extremities. She states the pain radiates down left leg into the thigh. She denies any problems with bowel or bladder control. She denies any fever. She denies any known injuries. She describes pain as burning.  Past Medical History  Diagnosis Date  . Hypertension   . Diabetes mellitus   . Hyperlipidemia    Past Surgical History  Procedure Laterality Date  . Tubal ligation     History reviewed. No pertinent family history. History  Substance Use Topics  . Smoking status: Never Smoker   . Smokeless tobacco: Not on file  . Alcohol Use: No   OB History   Grav Para Term Preterm Abortions TAB SAB Ect Mult Living                 Review of Systems  Constitutional: Negative for fever and chills.  Respiratory: Negative.   Cardiovascular: Negative.   Gastrointestinal: Negative for abdominal pain.  Musculoskeletal: Positive for back pain. Negative for arthralgias and myalgias.  Neurological: Negative for weakness and numbness.    Allergies  Review of patient's allergies indicates no known allergies.  Home Medications   Current Outpatient Rx  Name  Route  Sig  Dispense  Refill  . amLODipine (NORVASC) 5 MG tablet   Oral   Take 5 mg by mouth 2 (two) times daily.         [name] Kitchen aspirin EC 81 MG tablet   Oral   Take 81 mg by mouth daily.          . Atorvastatin Calcium (LIPITOR PO)   Oral   Take 1 tablet by mouth daily.         . ciprofloxacin (CIPRO) 500 MG tablet   Oral   Take 1 tablet (500 mg total) by mouth 2 (two) times daily.   14 tablet   0   . cloNIDine (CATAPRES) 0.1 MG tablet   Oral   Take 0.1 mg by mouth 2 (two) times daily.         . cyclobenzaprine (FEXMID) 7.5 MG tablet   Oral   Take 1 tablet (7.5 mg total) by mouth 3 (three) times daily as needed for muscle spasms.   30 tablet   0   . Diltiazem HCl Coated Beads (MATZIM LA PO)   Oral   Take 1 capsule by mouth daily.         [name] Kitchen LISINOPRIL PO   Oral   Take 2 tablets by mouth daily.         [name] Kitchen lisinopril-hydrochlorothiazide (PRINZIDE,ZESTORETIC) 20-12.5 MG per tablet   Oral   Take 2 tablets by mouth daily.         [name] Kitchen METFORMIN HCL PO   Oral   Take 1 tablet by mouth 2 (two) times daily.          [name] Kitchen oxyCODONE-acetaminophen (  PERCOCET/ROXICET) 5-325 MG per tablet   Oral   Take 2 tablets by mouth every 4 (four) hours as needed for severe pain.   15 tablet   0   . potassium chloride SA (K-DUR,KLOR-CON) 20 MEQ tablet   Oral   Take 20 mEq by mouth daily.          BP 128/67  Pulse 76  Temp(Src) 97.5 F (36.4 C) (Oral)  Resp 20  Ht 5\' 1"  (1.549 m)  Wt 195 lb (88.451 kg)  BMI 36.86 kg/m2  SpO2 100% Physical Exam  Nursing note and vitals reviewed. Constitutional: She appears well-developed and well-nourished. No distress.  HENT:  Head: Normocephalic.  Eyes: Conjunctivae are normal.  Neck: Neck supple.  Cardiovascular: Normal rate, regular rhythm and normal heart sounds.   Pulmonary/Chest: Effort normal and breath sounds normal. No respiratory distress. She has no wheezes. She has no rales.  Abdominal: Soft. Bowel sounds are normal. She exhibits no distension. There is no tenderness. There is no rebound.  Musculoskeletal: She exhibits no edema.  Tenderness to palpation over lumbar spine. Pain with left straight leg raise.    Neurological: She is alert.  5/5 and equal lower extremity strength. 2+ and equal patellar reflexes bilaterally. Pt able to dorsiflex bilateral toes and feet with good strength against resistance. Equal sensation bilaterally over thighs and lower legs.   Skin: Skin is warm and dry.  Psychiatric: She has a normal mood and affect. Her behavior is normal.    ED Course  Procedures (including critical care time) Labs Review Labs Reviewed - No data to display Imaging Review Mr Lumbar Spine Wo Contrast  05/29/2013   CLINICAL DATA:  67 year old female with pain radiating to the left buttock and lower extremity with numbness and weakness. Initial encounter.  EXAM: MRI LUMBAR SPINE WITHOUT CONTRAST  TECHNIQUE: Multiplanar, multisequence MR imaging was performed. No intravenous contrast was administered.  COMPARISON:  Lumbar radiographs [facility] 05/29/2013.  FINDINGS: Normal lumbar segmentation depicted on comparison. Mild grade 1 anterolisthesis of L4 on L5 re- identified. Otherwise normal vertebral height and alignment. No marrow edema or evidence of acute osseous abnormality.  Visualized lower thoracic spinal cord is normal with conus medularis at L1. The bladder is moderately distended. Other Visualized abdominal viscera and paraspinal soft tissues are within normal limits.  T11-T12: Mild disc bulge with involvement of the left neural foramen (series 4, image 9. Mild left foraminal stenosis.  T12-L1:  Negative.  L1-L2:  Negative.  L2-L3:  Negative.  L3-L4: Negative disc. Mild to moderate facet hypertrophy greater on the left. Mild ligament flavum hypertrophy. Borderline to mild spinal and left lateral recess stenosis. No foraminal stenosis.  L4-L5: Grade 1 spondylolisthesis. Associated disc bulge/ pseudo disc. Severe facet and moderate to severe ligament flavum hypertrophy. Trace fluid in both facet joints. Combined, there is moderate to severe spinal and lateral recess  stenosis. There is mild bilateral L4 foraminal stenosis.  L5-S1: Lobulated circumferential disc bulge/protrusion, eccentric to the right (series 6, image 34). Mild to moderate facet hypertrophy greater on the right. Mild right lateral recess stenosis with mild posterior deviation of the descending right S1 nerve root. No definite left lateral recess stenosis. There is left greater than right neural foraminal stenosis where the extra foraminal left L5 nerve appears inseparable from disc (image 34). That nerve also appears mildly asymmetrically enlarged. No significant spinal stenosis.  IMPRESSION: 1. L4-L5 grade 1 spondylolisthesis with associated disc and severe facet degeneration. Multifactorial moderate  to severe spinal and bilateral lateral recess stenosis with mild left L4 foraminal stenosis. 2. L5-S1 disc degeneration with lobulated circumferential bulging or protrusion of the disc (see series 6, image 34). Both the extra foraminal left L5 nerve and the right lateral recess appear affected. Mild to moderate facet degeneration at this level. 3. L3-L4 facet degeneration resulting in borderline to mild spinal and left lateral recess stenosis. 4. Small left foraminal disc protrusion at T11-T12.   Electronically Signed   By: Augusto Gamble M.D.   On: 05/29/2013 12:38    EKG Interpretation   None       MDM   1. Degenerative disc disease   2. Lumbosacral radiculopathy     Patient's with back pain and lumbar radiculopathy. She has no signs of cauda equina at this time. She has no problems with bladder or bowel control. She's got full strength in both extremities. She is ambulatory. She's afebrile. MRI obtained as above. She will be referred to a neurosurgeon. She states she is unable to control her pain with vicodin at home. Will give 10 percocets, instructed to stop vicodin while taking percocet. Will make sure taking NSAID. Return precautions given.   Filed Vitals:   05/29/13 1132  BP: 128/67  Pulse:  76  Temp: 97.5 F (36.4 C)  TempSrc: Oral  Resp: 20  Height: 5\' 1"  (1.549 m)  Weight: 195 lb (88.451 kg)  SpO2: 100%       Lottie Mussel, PA-C 05/29/13 1535

## 2013-05-30 NOTE — ED Provider Notes (Signed)
Medical screening examination/treatment/procedure(s) were performed by non-physician practitioner and as supervising physician I was immediately available for consultation/collaboration.  EKG Interpretation   None         Aisling Emigh B. Bernette Mayers, MD 05/30/13 1209

## 2013-06-02 NOTE — ED Notes (Signed)
Unable to contact via phone letter sent to [facility] address. 

## 2013-08-18 ENCOUNTER — Ambulatory Visit (INDEPENDENT_AMBULATORY_CARE_PROVIDER_SITE_OTHER): Payer: Commercial Managed Care - HMO | Admitting: Ophthalmology

## 2013-08-18 DIAGNOSIS — E1139 Type 2 diabetes mellitus with other diabetic ophthalmic complication: Secondary | ICD-10-CM

## 2013-08-18 DIAGNOSIS — E11319 Type 2 diabetes mellitus with unspecified diabetic retinopathy without macular edema: Secondary | ICD-10-CM

## 2013-08-18 DIAGNOSIS — H33309 Unspecified retinal break, unspecified eye: Secondary | ICD-10-CM

## 2013-08-18 DIAGNOSIS — E1165 Type 2 diabetes mellitus with hyperglycemia: Secondary | ICD-10-CM

## 2013-08-18 DIAGNOSIS — H35039 Hypertensive retinopathy, unspecified eye: Secondary | ICD-10-CM

## 2013-08-18 DIAGNOSIS — I10 Essential (primary) hypertension: Secondary | ICD-10-CM

## 2013-08-18 DIAGNOSIS — H2510 Age-related nuclear cataract, unspecified eye: Secondary | ICD-10-CM

## 2013-08-18 DIAGNOSIS — H43819 Vitreous degeneration, unspecified eye: Secondary | ICD-10-CM

## 2014-01-22 ENCOUNTER — Other Ambulatory Visit (HOSPITAL_COMMUNITY): Payer: Self-pay | Admitting: Family Medicine

## 2014-01-22 DIAGNOSIS — Z1231 Encounter for screening mammogram for malignant neoplasm of breast: Secondary | ICD-10-CM

## 2014-01-26 ENCOUNTER — Ambulatory Visit (HOSPITAL_COMMUNITY)
Admission: RE | Admit: 2014-01-26 | Discharge: 2014-01-26 | Disposition: A | Discharge status: Home / Self Care | Payer: Medicare HMO | Source: Ambulatory Visit | Attending: Family Medicine | Admitting: Family Medicine

## 2014-01-26 DIAGNOSIS — Z1231 Encounter for screening mammogram for malignant neoplasm of breast: Secondary | ICD-10-CM | POA: Insufficient documentation

## 2014-01-28 ENCOUNTER — Other Ambulatory Visit: Payer: Self-pay | Admitting: Family Medicine

## 2014-01-28 DIAGNOSIS — R928 Other abnormal and inconclusive findings on diagnostic imaging of breast: Secondary | ICD-10-CM

## 2014-02-17 ENCOUNTER — Ambulatory Visit (HOSPITAL_COMMUNITY)
Admission: RE | Admit: 2014-02-17 | Discharge: 2014-02-17 | Disposition: A | Discharge status: Home / Self Care | Payer: Medicare HMO | Source: Ambulatory Visit | Attending: Family Medicine | Admitting: Family Medicine

## 2014-02-17 DIAGNOSIS — R928 Other abnormal and inconclusive findings on diagnostic imaging of breast: Secondary | ICD-10-CM | POA: Diagnosis present

## 2014-05-27 ENCOUNTER — Other Ambulatory Visit: Payer: Medicare Other | Admitting: Family Medicine

## 2014-05-27 ENCOUNTER — Other Ambulatory Visit (HOSPITAL_COMMUNITY)
Admission: RE | Admit: 2014-05-27 | Discharge: 2014-05-27 | Disposition: A | Discharge status: Home / Self Care | Payer: Medicare HMO | Source: Ambulatory Visit | Attending: Family Medicine | Admitting: Family Medicine

## 2014-05-27 DIAGNOSIS — N760 Acute vaginitis: Secondary | ICD-10-CM | POA: Insufficient documentation

## 2014-05-27 DIAGNOSIS — Z113 Encounter for screening for infections with a predominantly sexual mode of transmission: Secondary | ICD-10-CM | POA: Diagnosis present

## 2014-05-29 LAB — URINE CYTOLOGY ANCILLARY ONLY
CANDIDA VAGINITIS: NEGATIVE
Chlamydia: NEGATIVE
NEISSERIA GONORRHEA: NEGATIVE
TRICH (WINDOWPATH): NEGATIVE

## 2014-09-15 DIAGNOSIS — H5210 Myopia, unspecified eye: Secondary | ICD-10-CM | POA: Diagnosis not present

## 2014-09-15 DIAGNOSIS — H524 Presbyopia: Secondary | ICD-10-CM | POA: Diagnosis not present

## 2014-09-15 DIAGNOSIS — E11319 Type 2 diabetes mellitus with unspecified diabetic retinopathy without macular edema: Secondary | ICD-10-CM | POA: Diagnosis not present

## 2014-09-30 DIAGNOSIS — E119 Type 2 diabetes mellitus without complications: Secondary | ICD-10-CM | POA: Diagnosis not present

## 2014-09-30 DIAGNOSIS — I10 Essential (primary) hypertension: Secondary | ICD-10-CM | POA: Diagnosis not present

## 2014-09-30 DIAGNOSIS — E6609 Other obesity due to excess calories: Secondary | ICD-10-CM | POA: Diagnosis not present

## 2014-11-17 DIAGNOSIS — M62830 Muscle spasm of back: Secondary | ICD-10-CM | POA: Diagnosis not present

## 2014-11-18 ENCOUNTER — Emergency Department (HOSPITAL_COMMUNITY)
Admission: EM | Admit: 2014-11-18 | Discharge: 2014-11-18 | Disposition: A | Discharge status: Home / Self Care | Payer: Commercial Managed Care - HMO | Attending: Emergency Medicine | Admitting: Emergency Medicine

## 2014-11-18 ENCOUNTER — Encounter (HOSPITAL_COMMUNITY): Payer: Self-pay | Admitting: Emergency Medicine

## 2014-11-18 DIAGNOSIS — E119 Type 2 diabetes mellitus without complications: Secondary | ICD-10-CM | POA: Insufficient documentation

## 2014-11-18 DIAGNOSIS — Z791 Long term (current) use of non-steroidal anti-inflammatories (NSAID): Secondary | ICD-10-CM | POA: Diagnosis not present

## 2014-11-18 DIAGNOSIS — M62830 Muscle spasm of back: Secondary | ICD-10-CM | POA: Insufficient documentation

## 2014-11-18 DIAGNOSIS — Z79899 Other long term (current) drug therapy: Secondary | ICD-10-CM | POA: Diagnosis not present

## 2014-11-18 DIAGNOSIS — Z7982 Long term (current) use of aspirin: Secondary | ICD-10-CM | POA: Diagnosis not present

## 2014-11-18 DIAGNOSIS — M546 Pain in thoracic spine: Secondary | ICD-10-CM | POA: Diagnosis not present

## 2014-11-18 DIAGNOSIS — I10 Essential (primary) hypertension: Secondary | ICD-10-CM | POA: Insufficient documentation

## 2014-11-18 DIAGNOSIS — R109 Unspecified abdominal pain: Secondary | ICD-10-CM | POA: Diagnosis present

## 2014-11-18 DIAGNOSIS — E785 Hyperlipidemia, unspecified: Secondary | ICD-10-CM | POA: Diagnosis not present

## 2014-11-18 DIAGNOSIS — Z792 Long term (current) use of antibiotics: Secondary | ICD-10-CM | POA: Insufficient documentation

## 2014-11-18 LAB — URINALYSIS, ROUTINE W REFLEX MICROSCOPIC
BILIRUBIN URINE: NEGATIVE
Glucose, UA: NEGATIVE mg/dL
NITRITE: NEGATIVE
PROTEIN: NEGATIVE mg/dL
Specific Gravity, Urine: 1.025 (ref 1.005–1.030)
UROBILINOGEN UA: 0.2 mg/dL (ref 0.0–1.0)
pH: 5 (ref 5.0–8.0)

## 2014-11-18 LAB — URINE MICROSCOPIC-ADD ON

## 2014-11-18 MED ORDER — HYDROCODONE-ACETAMINOPHEN 5-325 MG PO TABS
1.0000 | ORAL_TABLET | Freq: Once | ORAL | Status: AC
  Administered 2014-11-18: 1 via ORAL
  Filled 2014-11-18: qty 1

## 2014-11-18 MED ORDER — HYDROCODONE-ACETAMINOPHEN 5-325 MG PO TABS: 1.0000 | ORAL_TABLET | ORAL | Status: DC | PRN

## 2014-11-18 NOTE — ED Notes (Signed)
Pt reports left flank pain since yesterday. Pt reports intermittent nausea. Pt denies any v/d, urinary symptoms.

## 2014-11-18 NOTE — ED Provider Notes (Signed)
TIME SEEN: 9:04 PM   CHIEF COMPLAINT: flank pain  HPI: HPI Comments: Samantha [name] is a 69 y.o. female who presents to the Emergency Department complaining of improving left flank pain onset 3 days prior. Pt states that the pain has resolved since arriving to the ED. Pt states she saw her PCP yesterday and was prescribed 800 mg ibuprofen and flexeril that has provided slight relief. Pt states that the pain is worse with movement such as standing and sitting down. Pt denies hematuria, dysuria, numbness, weakness, fever, bowel/bladder incontinence. Pt denies Hx of kidney stones. No history of injury to the back. No nausea, vomiting or diarrhea. No radiation of pain.   ROS: See HPI Constitutional: no fever  Eyes: no drainage  ENT: no runny nose   Cardiovascular:  no chest pain  Resp: no SOB  GI: no vomiting GU: no dysuria Integumentary: no rash  Allergy: no hives  Musculoskeletal: no leg swelling  Neurological: no slurred speech ROS otherwise negative  PAST MEDICAL HISTORY/PAST SURGICAL HISTORY:  Past Medical History  Diagnosis Date  . Hypertension   . Diabetes mellitus   . Hyperlipidemia     MEDICATIONS:  Prior to Admission medications   Medication Sig Start Date End Date Taking? Authorizing Provider  amLODipine (NORVASC) 5 MG tablet Take 5 mg by mouth 2 (two) times daily.    Historical Provider, MD  aspirin EC 81 MG tablet Take 81 mg by mouth daily.    Historical Provider, MD  Atorvastatin Calcium (LIPITOR PO) Take 1 tablet by mouth daily.    Historical Provider, MD  ciprofloxacin (CIPRO) 500 MG tablet Take 1 tablet (500 mg total) by mouth 2 (two) times daily. 05/25/13   Teressa Lower, MD  cloNIDine (CATAPRES) 0.1 MG tablet Take 0.1 mg by mouth 2 (two) times daily.    Historical Provider, MD  cyclobenzaprine (FEXMID) 7.5 MG tablet Take 1 tablet (7.5 mg total) by mouth 3 (three) times daily as needed for muscle spasms. 05/25/13   Teressa Lower, MD  Diltiazem HCl Coated Beads (MATZIM  LA PO) Take 1 capsule by mouth daily.    Historical Provider, MD  LISINOPRIL PO Take 2 tablets by mouth daily.    Historical Provider, MD  lisinopril-hydrochlorothiazide (PRINZIDE,ZESTORETIC) 20-12.5 MG per tablet Take 2 tablets by mouth daily.    Historical Provider, MD  METFORMIN HCL PO Take 1 tablet by mouth 2 (two) times daily.     Historical Provider, MD  naproxen (NAPROSYN) 500 MG tablet Take 1 tablet (500 mg total) by mouth 2 (two) times daily. 05/29/13   Tatyana Kirichenko, PA-C  oxyCODONE-acetaminophen (PERCOCET) 5-325 MG per tablet Take 1 tablet by mouth every 4 (four) hours as needed for severe pain. 05/29/13   Tatyana Kirichenko, PA-C  oxyCODONE-acetaminophen (PERCOCET/ROXICET) 5-325 MG per tablet Take 2 tablets by mouth every 4 (four) hours as needed for severe pain. 05/25/13   Teressa Lower, MD  potassium chloride SA (K-DUR,KLOR-CON) 20 MEQ tablet Take 20 mEq by mouth daily.    Historical Provider, MD    ALLERGIES:  No Known Allergies  SOCIAL HISTORY:  History  Substance Use Topics  . Smoking status: Never Smoker   . Smokeless tobacco: Not on file  . Alcohol Use: No    FAMILY HISTORY: History reviewed. No pertinent family history.  EXAM: BP 144/65 mmHg  Pulse 77  Temp(Src) 98.6 F (37 C) (Oral)  Resp 18  Ht 5\' 1"  (1.549 m)  Wt 202 lb (91.627 kg)  BMI 38.19  kg/m2  SpO2 100% CONSTITUTIONAL: Alert and oriented and responds appropriately to questions. Well-appearing; well-nourished HEAD: Normocephalic EYES: Conjunctivae clear, PERRL ENT: normal nose; no rhinorrhea; moist mucous membranes; pharynx without lesions noted NECK: Supple, no meningismus, no LAD  CARD: RRR; S1 and S2 appreciated; no murmurs, no clicks, no rubs, no gallops RESP: Normal chest excursion without splinting or tachypnea; breath sounds clear and equal bilaterally; no wheezes, no rhonchi, no rales, no hypoxia or respiratory distress, speaking full sentences ABD/GI: Normal bowel sounds; non-distended;  soft, non-tender, no rebound, no guarding, no peritoneal signs BACK: Tender to palpation of left thoracic paraspinal musculature with associated spasm, no midline  tenderness with no step off, crepitus or deformity  , there is no CVA tenderness EXT: Normal ROM in all joints; non-tender to palpation; no edema; normal capillary refill; no cyanosis, no calf tenderness or swelling    SKIN: Normal color for age and race; warm NEURO: Moves all extremities equally, sensation to light touch intact diffusely, cranial nerves II through XII intact, strength 5/5 in all extremities   PSYCH: The patient's mood and manner are appropriate. Grooming and personal hygiene are appropriate.  MEDICAL DECISION MAKING: Patient here with musculoskeletal back pain. No other associated symptoms. Neurologically intact. No history of injury and no midline tenderness. I do not feel she needs any acute imaging. I doubt this is a kidney stone. I can reproduce the pain with palpation. Have advised her to continue ibuprofen and Flexeril will also discharge her with Vicodin to use as needed if pain is not controlled with ibuprofen. Have recommended outpatient follow-up with her PCP is symptoms continue. Discussed return precautions. She verbalized understanding and is comfortable with this plan.    I personally performed the services described in this documentation, which was scribed in my presence. The recorded information has been reviewed and is accurate.     [place], DO 11/19/14 [phone]

## 2014-11-18 NOTE — Discharge Instructions (Signed)
Back Pain, Adult Low back pain is very common. About 1 in 5 people have back pain.The cause of low back pain is rarely dangerous. The pain often gets better over time.About half of people with a sudden onset of back pain feel better in just 2 weeks. About 8 in 10 people feel better by 6 weeks.  CAUSES Some common causes of back pain include:  Strain of the muscles or ligaments supporting the spine.  Wear and tear (degeneration) of the spinal discs.  Arthritis.  Direct injury to the back. DIAGNOSIS Most of the time, the direct cause of low back pain is not known.However, back pain can be treated effectively even when the exact cause of the pain is unknown.Answering your caregiver's questions about your overall health and symptoms is one of the most accurate ways to make sure the cause of your pain is not dangerous. If your caregiver needs more information, he or she may order lab work or imaging tests (X-rays or MRIs).However, even if imaging tests show changes in your back, this usually does not require surgery. HOME CARE INSTRUCTIONS For many people, back pain returns.Since low back pain is rarely dangerous, it is often a condition that people can learn to manageon their own.   Remain active. It is stressful on the back to sit or stand in one place. Do not sit, drive, or stand in one place for more than 30 minutes at a time. Take short walks on level surfaces as soon as pain allows.Try to increase the length of time you walk each day.  Do not stay in bed.Resting more than 1 or 2 days can delay your recovery.  Do not avoid exercise or work.Your body is made to move.It is not dangerous to be active, even though your back may hurt.Your back will likely heal faster if you return to being active before your pain is gone.  Pay attention to your body when you bend and lift. Many people have less discomfortwhen lifting if they bend their knees, keep the load close to their bodies,and  avoid twisting. Often, the most comfortable positions are those that put less stress on your recovering back.  Find a comfortable position to sleep. Use a firm mattress and lie on your side with your knees slightly bent. If you lie on your back, put a pillow under your knees.  Only take over-the-counter or prescription medicines as directed by your caregiver. Over-the-counter medicines to reduce pain and inflammation are often the most helpful.Your caregiver may prescribe muscle relaxant drugs.These medicines help dull your pain so you can more quickly return to your normal activities and healthy exercise.  Put ice on the injured area.  Put ice in a plastic bag.  Place a towel between your skin and the bag.  Leave the ice on for 15-20 minutes, 03-04 times a day for the first 2 to 3 days. After that, ice and heat may be alternated to reduce pain and spasms.  Ask your caregiver about trying back exercises and gentle massage. This may be of some benefit.  Avoid feeling anxious or stressed.Stress increases muscle tension and can worsen back pain.It is important to recognize when you are anxious or stressed and learn ways to manage it.Exercise is a great option. SEEK MEDICAL CARE IF:  You have pain that is not relieved with rest or medicine.  You have pain that does not improve in 1 week.  You have new symptoms.  You are generally not feeling well. SEEK   IMMEDIATE MEDICAL CARE IF:   You have pain that radiates from your back into your legs.  You develop new bowel or bladder control problems.  You have unusual weakness or numbness in your arms or legs.  You develop nausea or vomiting.  You develop abdominal pain.  You feel faint. Document Released: 05/29/2005 Document Revised: 11/28/2011 Document Reviewed: 09/30/2013 Exit[employer] Patient Information 2015 [employer]. This information is not intended to replace advice given to you by your health care provider. Make sure you  discuss any questions you have with your health care provider.  

## 2014-11-18 NOTE — ED Notes (Signed)
Patient states left flank pain X3 days with increased pain with movement. Patient denies urinary symptoms. Patient was seen at PCP and diagnosed with "pulled muscle"

## 2015-01-29 DIAGNOSIS — E6609 Other obesity due to excess calories: Secondary | ICD-10-CM | POA: Diagnosis not present

## 2015-01-29 DIAGNOSIS — I10 Essential (primary) hypertension: Secondary | ICD-10-CM | POA: Diagnosis not present

## 2015-01-29 DIAGNOSIS — M79604 Pain in right leg: Secondary | ICD-10-CM | POA: Diagnosis not present

## 2015-01-29 DIAGNOSIS — E119 Type 2 diabetes mellitus without complications: Secondary | ICD-10-CM | POA: Diagnosis not present

## 2015-01-29 DIAGNOSIS — F341 Dysthymic disorder: Secondary | ICD-10-CM | POA: Diagnosis not present

## 2015-02-04 DIAGNOSIS — I10 Essential (primary) hypertension: Secondary | ICD-10-CM | POA: Diagnosis not present

## 2015-02-04 DIAGNOSIS — E6609 Other obesity due to excess calories: Secondary | ICD-10-CM | POA: Diagnosis not present

## 2015-02-04 DIAGNOSIS — E119 Type 2 diabetes mellitus without complications: Secondary | ICD-10-CM | POA: Diagnosis not present

## 2015-04-28 DIAGNOSIS — E119 Type 2 diabetes mellitus without complications: Secondary | ICD-10-CM | POA: Diagnosis not present

## 2015-04-28 DIAGNOSIS — E6609 Other obesity due to excess calories: Secondary | ICD-10-CM | POA: Diagnosis not present

## 2015-04-28 DIAGNOSIS — I10 Essential (primary) hypertension: Secondary | ICD-10-CM | POA: Diagnosis not present

## 2015-04-30 DIAGNOSIS — I10 Essential (primary) hypertension: Secondary | ICD-10-CM | POA: Diagnosis not present

## 2015-04-30 DIAGNOSIS — E6609 Other obesity due to excess calories: Secondary | ICD-10-CM | POA: Diagnosis not present

## 2015-04-30 DIAGNOSIS — E119 Type 2 diabetes mellitus without complications: Secondary | ICD-10-CM | POA: Diagnosis not present

## 2015-09-06 ENCOUNTER — Emergency Department (HOSPITAL_COMMUNITY)
Admission: EM | Admit: 2015-09-06 | Discharge: 2015-09-06 | Disposition: A | Discharge status: Home / Self Care | Payer: Commercial Managed Care - HMO | Attending: Dermatology | Admitting: Dermatology

## 2015-09-06 ENCOUNTER — Encounter (HOSPITAL_COMMUNITY): Payer: Self-pay | Admitting: Emergency Medicine

## 2015-09-06 DIAGNOSIS — Z5321 Procedure and treatment not carried out due to patient leaving prior to being seen by health care provider: Secondary | ICD-10-CM | POA: Insufficient documentation

## 2015-09-06 DIAGNOSIS — I10 Essential (primary) hypertension: Secondary | ICD-10-CM | POA: Diagnosis not present

## 2015-09-06 DIAGNOSIS — Z7984 Long term (current) use of oral hypoglycemic drugs: Secondary | ICD-10-CM | POA: Diagnosis not present

## 2015-09-06 DIAGNOSIS — E119 Type 2 diabetes mellitus without complications: Secondary | ICD-10-CM | POA: Insufficient documentation

## 2015-09-06 DIAGNOSIS — E785 Hyperlipidemia, unspecified: Secondary | ICD-10-CM | POA: Insufficient documentation

## 2015-09-06 LAB — CBC WITH DIFFERENTIAL/PLATELET
Basophils Absolute: 0 10*3/uL (ref 0.0–0.1)
Basophils Relative: 0 %
EOS ABS: 0.1 10*3/uL (ref 0.0–0.7)
EOS PCT: 2 %
HCT: 41.9 % (ref 36.0–46.0)
Hemoglobin: 12.7 g/dL (ref 12.0–15.0)
LYMPHS ABS: 1 10*3/uL (ref 0.7–4.0)
Lymphocytes Relative: 20 %
MCH: 24.7 pg — AB (ref 26.0–34.0)
MCHC: 30.3 g/dL (ref 30.0–36.0)
MCV: 81.4 fL (ref 78.0–100.0)
MONO ABS: 0.3 10*3/uL (ref 0.1–1.0)
Monocytes Relative: 6 %
Neutro Abs: 3.3 10*3/uL (ref 1.7–7.7)
Neutrophils Relative %: 72 %
PLATELETS: 227 10*3/uL (ref 150–400)
RBC: 5.15 MIL/uL — AB (ref 3.87–5.11)
RDW: 14.5 % (ref 11.5–15.5)
WBC: 4.7 10*3/uL (ref 4.0–10.5)

## 2015-09-06 LAB — COMPREHENSIVE METABOLIC PANEL
ALT: 32 U/L (ref 14–54)
ANION GAP: 9 (ref 5–15)
AST: 32 U/L (ref 15–41)
Albumin: 4.2 g/dL (ref 3.5–5.0)
Alkaline Phosphatase: 87 U/L (ref 38–126)
BUN: 10 mg/dL (ref 6–20)
CHLORIDE: 103 mmol/L (ref 101–111)
CO2: 28 mmol/L (ref 22–32)
Calcium: 9.4 mg/dL (ref 8.9–10.3)
Creatinine, Ser: 0.88 mg/dL (ref 0.44–1.00)
GFR calc non Af Amer: >60 mL/min (ref >60)
Glucose, Bld: 119 mg/dL — ABNORMAL HIGH (ref 65–99)
Potassium: 3.3 mmol/L — ABNORMAL LOW (ref 3.5–5.1)
Sodium: 140 mmol/L (ref 135–145)
Total Bilirubin: 1.2 mg/dL (ref 0.3–1.2)
Total Protein: 7.5 g/dL (ref 6.5–8.1)

## 2015-09-06 LAB — TROPONIN I

## 2015-09-06 NOTE — ED Notes (Signed)
Pt asking registration how much longer; this nurse informed pt that she still had 4 people ahead of her and no beds in the back area; pt requesting her BP to be taken; BP taken and pt stated she was going to leave; this nurse informed pt that if she had any changes in her condition to come back to ED; pt agreed

## 2015-09-06 NOTE — ED Notes (Signed)
Patient with reporting hypertension at home. Cut her dosage of Lisinopril in half, taking clonidine, norvasc as prescribed. Patient alert. C/o headache.

## 2015-09-09 DIAGNOSIS — E119 Type 2 diabetes mellitus without complications: Secondary | ICD-10-CM | POA: Diagnosis not present

## 2015-09-09 DIAGNOSIS — I10 Essential (primary) hypertension: Secondary | ICD-10-CM | POA: Diagnosis not present

## 2015-09-09 DIAGNOSIS — Z6837 Body mass index (BMI) 37.0-37.9, adult: Secondary | ICD-10-CM | POA: Diagnosis not present

## 2015-09-09 DIAGNOSIS — E6609 Other obesity due to excess calories: Secondary | ICD-10-CM | POA: Diagnosis not present

## 2015-09-09 DIAGNOSIS — J399 Disease of upper respiratory tract, unspecified: Secondary | ICD-10-CM | POA: Diagnosis not present

## 2015-10-11 DIAGNOSIS — M79652 Pain in left thigh: Secondary | ICD-10-CM | POA: Diagnosis not present

## 2015-10-11 DIAGNOSIS — E6609 Other obesity due to excess calories: Secondary | ICD-10-CM | POA: Diagnosis not present

## 2015-10-11 DIAGNOSIS — E782 Mixed hyperlipidemia: Secondary | ICD-10-CM | POA: Diagnosis not present

## 2015-10-11 DIAGNOSIS — E119 Type 2 diabetes mellitus without complications: Secondary | ICD-10-CM | POA: Diagnosis not present

## 2015-10-11 DIAGNOSIS — F341 Dysthymic disorder: Secondary | ICD-10-CM | POA: Diagnosis not present

## 2015-11-23 ENCOUNTER — Emergency Department (HOSPITAL_COMMUNITY)
Admission: EM | Admit: 2015-11-23 | Discharge: 2015-11-23 | Disposition: A | Discharge status: Home / Self Care | Payer: Commercial Managed Care - HMO | Attending: Emergency Medicine | Admitting: Emergency Medicine

## 2015-11-23 ENCOUNTER — Encounter (HOSPITAL_COMMUNITY): Payer: Self-pay | Admitting: Emergency Medicine

## 2015-11-23 DIAGNOSIS — Z79899 Other long term (current) drug therapy: Secondary | ICD-10-CM | POA: Insufficient documentation

## 2015-11-23 DIAGNOSIS — E785 Hyperlipidemia, unspecified: Secondary | ICD-10-CM | POA: Insufficient documentation

## 2015-11-23 DIAGNOSIS — M79605 Pain in left leg: Secondary | ICD-10-CM | POA: Diagnosis present

## 2015-11-23 DIAGNOSIS — Z7982 Long term (current) use of aspirin: Secondary | ICD-10-CM | POA: Diagnosis not present

## 2015-11-23 DIAGNOSIS — I10 Essential (primary) hypertension: Secondary | ICD-10-CM | POA: Insufficient documentation

## 2015-11-23 DIAGNOSIS — R739 Hyperglycemia, unspecified: Secondary | ICD-10-CM

## 2015-11-23 DIAGNOSIS — Z7984 Long term (current) use of oral hypoglycemic drugs: Secondary | ICD-10-CM | POA: Insufficient documentation

## 2015-11-23 DIAGNOSIS — M5416 Radiculopathy, lumbar region: Secondary | ICD-10-CM

## 2015-11-23 DIAGNOSIS — E1165 Type 2 diabetes mellitus with hyperglycemia: Secondary | ICD-10-CM | POA: Diagnosis not present

## 2015-11-23 LAB — CBG MONITORING, ED: GLUCOSE-CAPILLARY: 328 mg/dL — AB (ref 65–99)

## 2015-11-23 MED ORDER — IBUPROFEN 400 MG PO TABS
600.0000 mg | ORAL_TABLET | Freq: Once | ORAL | Status: DC
  Filled 2015-11-23: qty 2

## 2015-11-23 MED ORDER — IBUPROFEN 600 MG PO TABS: 600.0000 mg | ORAL_TABLET | Freq: Four times a day (QID) | ORAL | Status: DC | PRN

## 2015-11-23 MED ORDER — METHOCARBAMOL 500 MG PO TABS: 500.0000 mg | ORAL_TABLET | Freq: Two times a day (BID) | ORAL | Status: DC

## 2015-11-23 MED ORDER — IBUPROFEN 400 MG PO TABS
600.0000 mg | ORAL_TABLET | Freq: Once | ORAL | Status: AC
  Administered 2015-11-23: 600 mg via ORAL

## 2015-11-23 MED ORDER — TRAMADOL HCL 50 MG PO TABS: 50.0000 mg | ORAL_TABLET | Freq: Four times a day (QID) | ORAL | Status: DC | PRN

## 2015-11-23 NOTE — ED Notes (Signed)
PA at the bedside.

## 2015-11-23 NOTE — ED Notes (Signed)
Pt c/o LT leg pain x 2 weeks. States sometimes pain is in hip, but can present in thigh or calf. Pt has hx of sciatica. Pt ambulatory. No edema noted in lower extremities.

## 2015-11-23 NOTE — ED Provider Notes (Signed)
CSN: [id number]     Arrival date & time 11/23/15  1033 History   First MD Initiated Contact with Patient 11/23/15 1044     Chief Complaint  Patient presents with  . Leg Pain     (Consider location/radiation/quality/duration/timing/severity/associated sxs/prior Treatment) HPI Samantha [name] is a 70 y.o. female with history of hypertension, diabetes, hyperlipidemia, presents to emergency department complaining of left leg pain. Patient states that her leg pain started approximately 2 weeks ago. Pain is in the left buttock and radiates into the left calf. Pain is worse with movement, better with rest. Similar pain but worse several years ago when she had full workup including MRI of her lumbar spine which showed several bulging disks and foraminal stenosis. She has seen neurosurgeon at that time and was given pain medications as well as muscle relaxants. Patient states that her pain did get better eventually and she has been pain-free for several years. She denies any acute injuries. She denies anything that might have aggravated it. She denies any fever or chills. No urinary retention. No trouble controlling her bowels. Denies any weakness or numbness in her leg. She has seen her doctor when this pain started and was given some "pain medication I dont remember what." Patient states walking makes her pain worse, nothing makes it better. She is ambulatory.  Past Medical History  Diagnosis Date  . Hypertension   . Diabetes mellitus   . Hyperlipidemia    Past Surgical History  Procedure Laterality Date  . Tubal ligation     No family history on file. Social History  Substance Use Topics  . Smoking status: Never Smoker   . Smokeless tobacco: None  . Alcohol Use: No   OB History    No data available     Review of Systems  Constitutional: Negative for fever and chills.  Respiratory: Negative for cough, chest tightness and shortness of breath.   Cardiovascular: Negative for chest pain,  palpitations and leg swelling.  Gastrointestinal: Negative for nausea, vomiting, abdominal pain and diarrhea.  Genitourinary: Negative for dysuria, flank pain and pelvic pain.  Musculoskeletal: Positive for back pain and arthralgias. Negative for myalgias, neck pain and neck stiffness.  Skin: Negative for rash.  Neurological: Negative for dizziness, weakness, numbness and headaches.  All other systems reviewed and are negative.     Allergies  Review of patient's allergies indicates no known allergies.  Home Medications   Prior to Admission medications   Medication Sig Start Date End Date Taking? Authorizing Provider  amLODipine (NORVASC) 5 MG tablet Take 5 mg by mouth 2 (two) times daily.    Historical Provider, MD  aspirin EC 81 MG tablet Take 81 mg by mouth daily.    Historical Provider, MD  cloNIDine (CATAPRES) 0.1 MG tablet Take 0.1 mg by mouth 2 (two) times daily.    Historical Provider, MD  Diltiazem HCl Coated Beads (MATZIM LA PO) Take 1 capsule by mouth daily.    Historical Provider, MD  lisinopril-hydrochlorothiazide (PRINZIDE,ZESTORETIC) 20-12.5 MG per tablet Take 2 tablets by mouth daily.    Historical Provider, MD  metFORMIN (GLUCOPHAGE) 500 MG tablet Take 500 mg by mouth 2 (two) times daily with a meal. 11/04/15   Historical Provider, MD  potassium chloride SA (K-DUR,KLOR-CON) 20 MEQ tablet Take 20 mEq by mouth daily.    Historical Provider, MD  ZETIA 10 MG tablet Take 1 tablet by mouth daily. 11/13/15   Historical Provider, MD   BP 173/74 mmHg  Pulse  89  Temp(Src) 98 F (36.7 C) (Oral)  Resp 18  Ht 5\' 1"  (1.549 m)  Wt 92.987 kg  BMI 38.75 kg/m2  SpO2 100% Physical Exam  Constitutional: She is oriented to person, place, and time. She appears well-developed and well-nourished. No distress.  HENT:  Head: Normocephalic.  Eyes: Conjunctivae are normal.  Neck: Neck supple.  Cardiovascular: Normal rate, regular rhythm and normal heart sounds.   Pulmonary/Chest: Effort  normal and breath sounds normal. No respiratory distress. She has no wheezes. She has no rales.  Abdominal: Soft. Bowel sounds are normal. She exhibits no distension. There is no tenderness. There is no rebound.  Musculoskeletal: She exhibits no edema.  Tenderness over left hip joint, full range of motion of the hip, pain with left straight leg raise. No midline lumbar spine tenderness.  Neurological: She is alert and oriented to person, place, and time.  5/5 and equal lower extremity strength. 2+ and equal patellar reflexes bilaterally. Pt able to dorsiflex bilateral toes and feet with good strength against resistance. Equal sensation bilaterally over thighs and lower legs. Gait normal   Skin: Skin is warm and dry.  Psychiatric: She has a normal mood and affect. Her behavior is normal.  Nursing note and vitals reviewed.   ED Course  Procedures (including critical care time) Labs Review Labs Reviewed  CBG MONITORING, ED - Abnormal; Notable for the following:    Glucose-Capillary 328 (*)    All other components within normal limits    Imaging Review No results found. I have personally reviewed and evaluated these images and lab results as part of my medical decision-making.   EKG Interpretation None      MDM   Final diagnoses:  Lumbar radiculopathy  Hyperglycemia   Pt with lower back pain radiating into left leg. I reviewed pt's prior visits and MRI. 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 At this time no evidence of cauda equina. Suspect recurrence of radicular pain. CBG 328 in ED. Do not think steroids would be appropriate in setting of hyperglycemia. WIll treat symptomatically, ibuprofen, tramadol, robaxin. Pt will need follow up with neurosurgery. She wishes to see Dr. Christella Noa. Explained to her he is not on call,but she can call and try to get in with him. Pt voiced understanding. Will dc home with strict return precautions.   Filed Vitals:   11/23/15  1041 11/23/15 1141  BP: 173/74 137/75  Pulse: 89 81  Temp: 98 F (36.7 C)   TempSrc: Oral   Resp: 18 18  Height: 5\' 1"  (1.549 m)   Weight: 92.987 kg   SpO2: 100% 100%     Jeannett Senior, PA-C 11/23/15 1208  Elnora Morrison, MD 11/23/15 1552

## 2015-11-23 NOTE — Discharge Instructions (Signed)
Take ibuprofen for pain. Tramadol for severe pain. Robaxin for spasms. Make sure this is not the same medication that your doctor prescribed you. Follow up with spine specialist. I think your pain is coming from bulging disks in your back. You can start doing back exercises at home as well. Your blood sugar is elevated in emergency dept. Make sure to keep close eye on blood sugars and eat well. Follow up with your doctor for blood sugar control  Herniated Disk A herniated disk occurs when a disk in your spine bulges out too far. This condition is also called a ruptured disk or slipped disk. Your spine (backbone) is made up of bones called vertebrae. Between each pair of vertebrae is an oval disk with a soft, spongy center that acts as a shock absorber when you move. The spongy center is surrounded by a tough outer ring. When you have a herniated disk, the spongy center of the disk bulges out or ruptures through the outer ring. A herniated disk can press on a nerve between your vertebrae and cause pain. A herniated disk can occur anywhere in your back or neck area, but the lower back is the most common spot. CAUSES  In many cases, a herniated disk occurs just from getting older. As you age, the spongy insides of your disks tend to shrink and dry out. A herniated disk can result from gradual wear and tear. Injury or sudden strain can also cause a herniated disk.  RISK FACTORS Aging is the main risk factor for a herniated disk. Other risk factors include: 1. Being a man between the ages of 31 and 39 years. 2. Having a job that requires heavy lifting, bending, or twisting. 3. Having a job that requires long hours of driving. 4. Not getting enough exercise. 5. Being overweight. 6. Smoking. SIGNS AND SYMPTOMS  Signs and symptoms depend on which disk is herniated. 1. For a herniated disk in the lower back, you may have sharp pain in: 1. One part of your leg, hip, or buttocks. 2. The back of your  calf. 3. The top or sole of your foot (sciatica).  2. For a herniated disk in the neck, you may feel pain: 1. When you move your neck. 2. Near or over your shoulder blade. 3. That moves to your upper arm, forearm, or fingers.  3. You may also have muscle weakness. It may be hard to: 1. Lift your leg or arm. 2. Stand on your toes. 3. Squeeze tightly with one of your hands. 4. Other symptoms can include: 1. Numbness or tingling in the affected areas of your body. 2. Loss of bladder or bowel control. This is a rare but serious sign of a severe herniated disk in the lower back. DIAGNOSIS  Your health care provider will do a physical exam. During this exam, you may have to move certain body parts or assume various positions. For example, your health care provider may do the straight-leg test. This is a good way to test for a herniated disk in your lower back. In this test, the health care provider lifts your leg while you lie on your back. This is to see if you feel pain down your leg. Your health care provider will also check for numbness or loss of feeling. 1. Your health care provider will also check your: 1. Reflexes. 2. Muscle strength. 3. Posture. 2. Other tests may be done to help in making a diagnosis. These may include: 1. An X-ray  of the spine to rule out other causes of back pain.  2. Other imaging studies, such as an MRI or CT scan. This is to check whether the herniated disk is pressing on your spinal canal. 3. Electromyography (EMG). This test checks the nerves that control muscles. It is sometimes used to identify the specific area of nerve involvement.  TREATMENT  In many cases, herniated disk symptoms go away over a period of days or weeks. You will most likely be free of symptoms in 3-4 months. Treatment may include the following: 1. The initial treatment for a herniated disk is ashort period of rest. 1. Bed rest is often limited to 1 or 2 days. Resting for too long delays  recovery. 2. If you have a herniated disk in your lower back, you should avoid sitting as much as possible because sitting increases pressure on the disk. 2. Medicines. These may include:  1. Nonsteroidal anti-inflammatory drugs (NSAIDs). 2. Muscle relaxants for back spasms. 3. Narcotic pain medicine if your pain is very bad.  3. Steroid injections. You may need these along the involved nerve root to help control pain. The steroid is injected in the area of the herniated disk. It helps by reducing swelling around the disk. 4. Physical therapy. This may include exercises to strengthen the muscles that help support your spine.  5. You may need surgery if other treatments do not work.  HOME CARE INSTRUCTIONS Follow all your health care provider's instructions. These may include: 1. Take all medicines as directed by your health care provider. 2. Rest for 2 days and then start moving. 1. Do not sit or stand for long periods of time. 2. Maintain good posture when sitting and standing. 3. Avoid movements that cause pain, such as bending or lifting. 3. When you are able to start lifting things again: 1. Bend with your knees. 2. Keep your back straight. 3. Hold heavy objects close to your body. 4. If you are overweight, ask your health care provider to help you start a weight-loss program. 5. When you are able to start exercising, ask your health care provider how much and what type of exercise is best for you. 6. Work with a physical therapist on stretching and strengthening exercises for your back. 7. Do not wear high-heeled shoes. 8. Do not sleep on your belly. 9. Do not smoke. 10. Keep all follow-up visits as directed by your health care provider. SEEK MEDICAL CARE IF: 1. You have back or neck pain that is not getting better after 4 weeks. 2. You have very bad pain in your back or neck. 3. You develop numbness, tingling, or weakness along with pain. SEEK IMMEDIATE MEDICAL CARE IF:   1. You have numbness, tingling, or weakness that makes you unable to use your arms or legs. 2. You lose control of your bladder or bowels. 3. You have dizziness or fainting. 4. You have shortness of breath.  MAKE SURE YOU:   Understand these instructions.  Will watch your condition.  Will get help right away if you are not doing well or get worse.   This information is not intended to replace advice given to you by your health care provider. Make sure you discuss any questions you have with your health care provider.   Document Released: 05/26/2000 Document Revised: 06/19/2014 Document Reviewed: 05/02/2013 Elsevier Interactive Patient Education 2016 [employer]    Back Exercises The following exercises strengthen the muscles that help to support the back. They also  help to keep the lower back flexible. Doing these exercises can help to prevent back pain or lessen existing pain. If you have back pain or discomfort, try doing these exercises 2-3 times each day or as told by your health care provider. When the pain goes away, do them once each day, but increase the number of times that you repeat the steps for each exercise (do more repetitions). If you do not have back pain or discomfort, do these exercises once each day or as told by your health care provider. EXERCISES Single Knee to Chest Repeat these steps 3-5 times for each leg: 7. Lie on your back on a firm bed or the floor with your legs extended. 8. Bring one knee to your chest. Your other leg should stay extended and in contact with the floor. 9. Hold your knee in place by grabbing your knee or thigh. 10. Pull on your knee until you feel a gentle stretch in your lower back. 11. Hold the stretch for 10-30 seconds. 12. Slowly release and straighten your leg. Pelvic Tilt Repeat these steps 5-10 times: 5. Lie on your back on a firm bed or the floor with your legs extended. 6. Bend your knees so they are pointing toward the  ceiling and your feet are flat on the floor. 7. Tighten your lower abdominal muscles to press your lower back against the floor. This motion will tilt your pelvis so your tailbone points up toward the ceiling instead of pointing to your feet or the floor. 8. With gentle tension and even breathing, hold this position for 5-10 seconds. Cat-Cow Repeat these steps until your lower back becomes more flexible: 3. Get into a hands-and-knees position on a firm surface. Keep your hands under your shoulders, and keep your knees under your hips. You may place padding under your knees for comfort. 4. Let your head hang down, and point your tailbone toward the floor so your lower back becomes rounded like the back of a cat. 5. Hold this position for 5 seconds. 6. Slowly lift your head and point your tailbone up toward the ceiling so your back forms a sagging arch like the back of a cow. 7. Hold this position for 5 seconds. Press-Ups Repeat these steps 5-10 times: 6. Lie on your abdomen (face-down) on the floor. 7. Place your palms near your head, about shoulder-width apart. 8. While you keep your back as relaxed as possible and keep your hips on the floor, slowly straighten your arms to raise the top half of your body and lift your shoulders. Do not use your back muscles to raise your upper torso. You may adjust the placement of your hands to make yourself more comfortable. 9. Hold this position for 5 seconds while you keep your back relaxed. 10. Slowly return to lying flat on the floor. Bridges Repeat these steps 10 times: 11. Lie on your back on a firm surface. 12. Bend your knees so they are pointing toward the ceiling and your feet are flat on the floor. 31. Tighten your buttocks muscles and lift your buttocks off of the floor until your waist is at almost the same height as your knees. You should feel the muscles working in your buttocks and the back of your thighs. If you do not feel these muscles,  slide your feet 1-2 inches farther away from your buttocks. 14. Hold this position for 3-5 seconds. 15. Slowly lower your hips to the starting position, and allow your buttocks muscles  to relax completely. If this exercise is too easy, try doing it with your arms crossed over your chest. Abdominal Crunches Repeat these steps 5-10 times: 4. Lie on your back on a firm bed or the floor with your legs extended. 5. Bend your knees so they are pointing toward the ceiling and your feet are flat on the floor. 6. Cross your arms over your chest. 7. Tip your chin slightly toward your chest without bending your neck. 8. Tighten your abdominal muscles and slowly raise your trunk (torso) high enough to lift your shoulder blades a tiny bit off of the floor. Avoid raising your torso higher than that, because it can put too much stress on your low back and it does not help to strengthen your abdominal muscles. 9. Slowly return to your starting position. Back Lifts Repeat these steps 5-10 times: 5. Lie on your abdomen (face-down) with your arms at your sides, and rest your forehead on the floor. 6. Tighten the muscles in your legs and your buttocks. 7. Slowly lift your chest off of the floor while you keep your hips pressed to the floor. Keep the back of your head in line with the curve in your back. Your eyes should be looking at the floor. 8. Hold this position for 3-5 seconds. 9. Slowly return to your starting position. SEEK MEDICAL CARE IF:  Your back pain or discomfort gets much worse when you do an exercise.  Your back pain or discomfort does not lessen within 2 hours after you exercise. If you have any of these problems, stop doing these exercises right away. Do not do them again unless your health care provider says that you can. SEEK IMMEDIATE MEDICAL CARE IF:  You develop sudden, severe back pain. If this happens, stop doing the exercises right away. Do not do them again unless your health care  provider says that you can.   This information is not intended to replace advice given to you by your health care provider. Make sure you discuss any questions you have with your health care provider.   Document Released: 07/06/2004 Document Revised: 02/17/2015 Document Reviewed: 07/23/2014 Elsevier Interactive Patient Education [employer].

## 2016-01-13 DIAGNOSIS — M79652 Pain in left thigh: Secondary | ICD-10-CM | POA: Diagnosis not present

## 2016-01-13 DIAGNOSIS — F341 Dysthymic disorder: Secondary | ICD-10-CM | POA: Diagnosis not present

## 2016-01-13 DIAGNOSIS — I10 Essential (primary) hypertension: Secondary | ICD-10-CM | POA: Diagnosis not present

## 2016-01-13 DIAGNOSIS — E119 Type 2 diabetes mellitus without complications: Secondary | ICD-10-CM | POA: Diagnosis not present

## 2016-01-13 DIAGNOSIS — E785 Hyperlipidemia, unspecified: Secondary | ICD-10-CM | POA: Diagnosis not present

## 2016-01-13 DIAGNOSIS — E782 Mixed hyperlipidemia: Secondary | ICD-10-CM | POA: Diagnosis not present

## 2016-03-07 DIAGNOSIS — M130 Polyarthritis, unspecified: Secondary | ICD-10-CM | POA: Diagnosis not present

## 2016-03-07 DIAGNOSIS — M79651 Pain in right thigh: Secondary | ICD-10-CM | POA: Diagnosis not present

## 2016-03-07 DIAGNOSIS — E782 Mixed hyperlipidemia: Secondary | ICD-10-CM | POA: Diagnosis not present

## 2016-03-08 ENCOUNTER — Other Ambulatory Visit (HOSPITAL_COMMUNITY): Payer: Self-pay | Admitting: Family Medicine

## 2016-03-08 ENCOUNTER — Ambulatory Visit (HOSPITAL_COMMUNITY)
Admission: RE | Admit: 2016-03-08 | Discharge: 2016-03-08 | Disposition: A | Discharge status: Home / Self Care | Payer: Commercial Managed Care - HMO | Source: Ambulatory Visit | Attending: Family Medicine | Admitting: Family Medicine

## 2016-03-08 DIAGNOSIS — M79652 Pain in left thigh: Secondary | ICD-10-CM | POA: Diagnosis not present

## 2016-03-08 DIAGNOSIS — M130 Polyarthritis, unspecified: Secondary | ICD-10-CM | POA: Insufficient documentation

## 2016-03-08 DIAGNOSIS — M47816 Spondylosis without myelopathy or radiculopathy, lumbar region: Secondary | ICD-10-CM | POA: Diagnosis not present

## 2016-03-08 DIAGNOSIS — M5136 Other intervertebral disc degeneration, lumbar region: Secondary | ICD-10-CM | POA: Insufficient documentation

## 2016-03-08 DIAGNOSIS — E782 Mixed hyperlipidemia: Secondary | ICD-10-CM | POA: Insufficient documentation

## 2016-03-08 DIAGNOSIS — M25552 Pain in left hip: Secondary | ICD-10-CM | POA: Diagnosis not present

## 2016-03-30 DIAGNOSIS — M130 Polyarthritis, unspecified: Secondary | ICD-10-CM | POA: Diagnosis not present

## 2016-03-30 DIAGNOSIS — M79651 Pain in right thigh: Secondary | ICD-10-CM | POA: Diagnosis not present

## 2016-05-25 DIAGNOSIS — E785 Hyperlipidemia, unspecified: Secondary | ICD-10-CM | POA: Diagnosis not present

## 2016-05-25 DIAGNOSIS — E782 Mixed hyperlipidemia: Secondary | ICD-10-CM | POA: Diagnosis not present

## 2016-05-25 DIAGNOSIS — M130 Polyarthritis, unspecified: Secondary | ICD-10-CM | POA: Diagnosis not present

## 2016-05-25 DIAGNOSIS — E119 Type 2 diabetes mellitus without complications: Secondary | ICD-10-CM | POA: Diagnosis not present

## 2016-05-25 DIAGNOSIS — Z0000 Encounter for general adult medical examination without abnormal findings: Secondary | ICD-10-CM | POA: Diagnosis not present

## 2016-05-25 DIAGNOSIS — I10 Essential (primary) hypertension: Secondary | ICD-10-CM | POA: Diagnosis not present

## 2016-07-06 DIAGNOSIS — E782 Mixed hyperlipidemia: Secondary | ICD-10-CM | POA: Diagnosis not present

## 2016-07-06 DIAGNOSIS — I10 Essential (primary) hypertension: Secondary | ICD-10-CM | POA: Diagnosis not present

## 2016-07-18 ENCOUNTER — Ambulatory Visit (INDEPENDENT_AMBULATORY_CARE_PROVIDER_SITE_OTHER): Payer: Medicare HMO | Admitting: Podiatry

## 2016-07-18 DIAGNOSIS — L84 Corns and callosities: Secondary | ICD-10-CM

## 2016-07-18 DIAGNOSIS — E1151 Type 2 diabetes mellitus with diabetic peripheral angiopathy without gangrene: Secondary | ICD-10-CM | POA: Diagnosis not present

## 2016-07-18 DIAGNOSIS — R0989 Other specified symptoms and signs involving the circulatory and respiratory systems: Secondary | ICD-10-CM | POA: Diagnosis not present

## 2016-07-18 NOTE — Patient Instructions (Signed)
Today her diabetic foot screen demonstrated some decreased pulses in your feet without history of open sores or calf cramping, claudication Protective sensation, inability feels intact The calluses on the bottom of the right and left feet should be trimmed occasionally at your request Return for yearly foot exams or sooner if you have concern   Diabetes and Foot Care Diabetes may cause you to have problems because of poor blood supply (circulation) to your feet and legs. This may cause the skin on your feet to become thinner, break easier, and heal more slowly. Your skin may become dry, and the skin may peel and crack. You may also have nerve damage in your legs and feet causing decreased feeling in them. You may not notice minor injuries to your feet that could lead to infections or more serious problems. Taking care of your feet is one of the most important things you can do for yourself. Follow these instructions at home:  Wear shoes at all times, even in the house. Do not go barefoot. Bare feet are easily injured.  Check your feet daily for blisters, cuts, and redness. If you cannot see the bottom of your feet, use a mirror or ask someone for help.  Wash your feet with warm water (do not use hot water) and mild soap. Then pat your feet and the areas between your toes until they are completely dry. Do not soak your feet as this can dry your skin.  Apply a moisturizing lotion or petroleum jelly (that does not contain alcohol and is unscented) to the skin on your feet and to dry, brittle toenails. Do not apply lotion between your toes.  Trim your toenails straight across. Do not dig under them or around the cuticle. File the edges of your nails with an emery board or nail file.  Do not cut corns or calluses or try to remove them with medicine.  Wear clean socks or stockings every day. Make sure they are not too tight. Do not wear knee-high stockings since they may decrease blood flow to your  legs.  Wear shoes that fit properly and have enough cushioning. To break in new shoes, wear them for just a few hours a day. This prevents you from injuring your feet. Always look in your shoes before you put them on to be sure there are no objects inside.  Do not cross your legs. This may decrease the blood flow to your feet.  If you find a minor scrape, cut, or break in the skin on your feet, keep it and the skin around it clean and dry. These areas may be cleansed with mild soap and water. Do not cleanse the area with peroxide, alcohol, or iodine.  When you remove an adhesive bandage, be sure not to damage the skin around it.  If you have a wound, look at it several times a day to make sure it is healing.  Do not use heating pads or hot water bottles. They may burn your skin. If you have lost feeling in your feet or legs, you may not know it is happening until it is too late.  Make sure your health care provider performs a complete foot exam at least annually or more often if you have foot problems. Report any cuts, sores, or bruises to your health care provider immediately. Contact a health care provider if:  You have an injury that is not healing.  You have cuts or breaks in the skin.  You  have an ingrown nail.  You notice redness on your legs or feet.  You feel burning or tingling in your legs or feet.  You have pain or cramps in your legs and feet.  Your legs or feet are numb.  Your feet always feel cold. Get help right away if:  There is increasing redness, swelling, or pain in or around a wound.  There is a red line that goes up your leg.  Pus is coming from a wound.  You develop a fever or as directed by your health care provider.  You notice a bad smell coming from an ulcer or wound. This information is not intended to replace advice given to you by your health care provider. Make sure you discuss any questions you have with your health care provider. Document  Released: 05/26/2000 Document Revised: 11/04/2015 Document Reviewed: 11/05/2012 Elsevier Interactive Patient Education  2017 [employer].

## 2016-07-18 NOTE — Progress Notes (Signed)
   Subjective:    Patient ID: Samantha [name], female    DOB: 1946/03/12, 71 y.o.   MRN: [id number]  HPI   This patient presents today at the recommendation of Dr. Criss Rosales requesting a diabetic foot screen. Patient is diabetic 20 years and denies any history of ulceration, claudication or amputation Patient denies smoking Patient has long history (20+ years) of plantar calluses which she occasionally trims himself denies any symptoms from these lesions      Review of Systems  All other systems reviewed and are negative.      Objective:   Physical Exam  Orientated 3  Vascular: DP pulses 2/4 bilaterally PT pulses 1/4 bilaterally Capillary reflex delay bilaterally  Neurological: Sensation to 10 g monofilament wire intact 5/5 bilaterally Vibratory sensation reactive bilaterally Ankle reflexes reactive bilaterally  Dermatological: No open skin lesions bilaterally Well-organized plantar calluses sub-fifth MPJ bilaterally with some reactive darkened debris within the plantar callus. These lesions are symmetrical in appearance. 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

## 2016-08-18 DIAGNOSIS — F341 Dysthymic disorder: Secondary | ICD-10-CM | POA: Diagnosis not present

## 2016-08-18 DIAGNOSIS — E119 Type 2 diabetes mellitus without complications: Secondary | ICD-10-CM | POA: Diagnosis not present

## 2016-08-18 DIAGNOSIS — M130 Polyarthritis, unspecified: Secondary | ICD-10-CM | POA: Diagnosis not present

## 2016-08-18 DIAGNOSIS — E6609 Other obesity due to excess calories: Secondary | ICD-10-CM | POA: Diagnosis not present

## 2016-08-18 DIAGNOSIS — I739 Peripheral vascular disease, unspecified: Secondary | ICD-10-CM | POA: Diagnosis not present

## 2016-08-18 DIAGNOSIS — I10 Essential (primary) hypertension: Secondary | ICD-10-CM | POA: Diagnosis not present

## 2016-09-11 ENCOUNTER — Emergency Department (HOSPITAL_COMMUNITY)
Admission: EM | Admit: 2016-09-11 | Discharge: 2016-09-11 | Disposition: A | Discharge status: Home / Self Care | Payer: Medicare HMO | Attending: Emergency Medicine | Admitting: Emergency Medicine

## 2016-09-11 ENCOUNTER — Emergency Department (HOSPITAL_COMMUNITY): Payer: Medicare HMO

## 2016-09-11 ENCOUNTER — Encounter (HOSPITAL_COMMUNITY): Payer: Self-pay | Admitting: Emergency Medicine

## 2016-09-11 DIAGNOSIS — Y9389 Activity, other specified: Secondary | ICD-10-CM | POA: Diagnosis not present

## 2016-09-11 DIAGNOSIS — Z7984 Long term (current) use of oral hypoglycemic drugs: Secondary | ICD-10-CM | POA: Insufficient documentation

## 2016-09-11 DIAGNOSIS — Y999 Unspecified external cause status: Secondary | ICD-10-CM | POA: Insufficient documentation

## 2016-09-11 DIAGNOSIS — I10 Essential (primary) hypertension: Secondary | ICD-10-CM | POA: Diagnosis not present

## 2016-09-11 DIAGNOSIS — M25562 Pain in left knee: Secondary | ICD-10-CM | POA: Insufficient documentation

## 2016-09-11 DIAGNOSIS — Y929 Unspecified place or not applicable: Secondary | ICD-10-CM | POA: Insufficient documentation

## 2016-09-11 DIAGNOSIS — X501XXA Overexertion from prolonged static or awkward postures, initial encounter: Secondary | ICD-10-CM | POA: Diagnosis not present

## 2016-09-11 DIAGNOSIS — E119 Type 2 diabetes mellitus without complications: Secondary | ICD-10-CM | POA: Insufficient documentation

## 2016-09-11 DIAGNOSIS — Z7982 Long term (current) use of aspirin: Secondary | ICD-10-CM | POA: Diagnosis not present

## 2016-09-11 DIAGNOSIS — S8992XA Unspecified injury of left lower leg, initial encounter: Secondary | ICD-10-CM | POA: Diagnosis not present

## 2016-09-11 LAB — CBG MONITORING, ED: Glucose-Capillary: 183 mg/dL — ABNORMAL HIGH (ref 65–99)

## 2016-09-11 NOTE — ED Provider Notes (Signed)
[facility] DEPT Provider Note   CSN: [id number] Arrival date & time: 09/11/16  1541  By signing my name below, I, Jeanell Sparrow, attest that this documentation has been prepared under the direction and in the presence of non-physician practitioner, Debroah Baller, NP. Electronically Signed: Jeanell Sparrow, Scribe. 09/11/2016. 5:14 PM.  History   Chief Complaint Chief Complaint  Patient presents with  . Knee Pain   The history is provided by the patient. No language interpreter was used.  Knee Pain   This is a new problem. The problem occurs constantly. The problem has not changed since onset.The pain is present in the left knee. The pain is moderate. The symptoms are aggravated by activity. She has tried nothing for the symptoms.   HPI Comments: Samantha [name] is a 71 y.o. female who presents to the Emergency Department complaining of constant moderate left knee pain that started today. She states her left knee twisted while getting out of her car. She grabbed the door and prevented her fall. Her pain is exacerbated by weight-bearing. She denies any other complaints.     PCP: Elyn Peers, MD  Past Medical History:  Diagnosis Date  . Diabetes mellitus   . Hyperlipidemia   . Hypertension     Patient Active Problem List   Diagnosis Date Noted  . 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 DERANGEMENT MENISCUS 12/16/2007  . DIABETES MELLITUS, TYPE II, CONTROLLED 12/18/2006  . HYPERLIPIDEMIA 05/07/2006  . OBESITY NOS 05/07/2006  . ANEMIA-NOS 05/07/2006  . CARPAL TUNNEL SYNDROME 05/07/2006  . HYPERTENSION 05/07/2006    Past Surgical History:  Procedure Laterality Date  . TUBAL LIGATION      OB History    No data available       Home Medications    Prior to Admission medications   Medication Sig Start Date End Date Taking? Authorizing Provider  acetaminophen (TYLENOL) 500 MG tablet Take 1,000 mg by mouth every 6 (six) hours as needed for mild pain.    Historical  Provider, MD  amLODipine (NORVASC) 5 MG tablet Take 5 mg by mouth 2 (two) times daily.    Historical Provider, MD  aspirin EC 81 MG tablet Take 81 mg by mouth daily.    Historical Provider, MD  cloNIDine (CATAPRES) 0.1 MG tablet Take 0.1 mg by mouth 2 (two) times daily.    Historical Provider, MD  ibuprofen (ADVIL,MOTRIN) 600 MG tablet Take 1 tablet (600 mg total) by mouth every 6 (six) hours as needed. 11/23/15   Tatyana Kirichenko, PA-C  lisinopril-hydrochlorothiazide (PRINZIDE,ZESTORETIC) 20-12.5 MG per tablet Take 2 tablets by mouth daily.    Historical Provider, MD  metFORMIN (GLUCOPHAGE) 500 MG tablet Take 500 mg by mouth 2 (two) times daily with a meal. 11/04/15   Historical Provider, MD  methocarbamol (ROBAXIN) 500 MG tablet Take 1 tablet (500 mg total) by mouth 2 (two) times daily. 11/23/15   Tatyana Kirichenko, PA-C  potassium chloride SA (K-DUR,KLOR-CON) 20 MEQ tablet Take 20 mEq by mouth daily.    Historical Provider, MD  traMADol (ULTRAM) 50 MG tablet Take 1 tablet (50 mg total) by mouth every 6 (six) hours as needed. 11/23/15   Tatyana Kirichenko, PA-C  ZETIA 10 MG tablet Take 1 tablet by mouth daily. 11/13/15   Historical Provider, MD    Family History History reviewed. No pertinent family history.  Social History Social History  Substance Use Topics  . Smoking status: Never Smoker  . Smokeless tobacco: Never Used  . Alcohol use  No     Allergies   Patient has no known allergies.   Review of Systems Review of Systems  Gastrointestinal: Negative for nausea and vomiting.  Musculoskeletal: Positive for arthralgias (Left knee) and myalgias (Left knee).  Skin: Negative for wound.  Neurological: Negative for syncope and headaches.     Physical Exam Updated Vital Signs BP 136/79 (BP Location: Right Arm)   Pulse 83   Temp 98.4 F (36.9 C) (Oral)   Resp 18   Ht 5\' 1"  (1.549 m)   Wt 205 lb (93 kg)   SpO2 95%   BMI 38.73 kg/m   Physical Exam  Constitutional: She  appears well-developed and well-nourished. No distress.  HENT:  Head: Normocephalic.  Eyes: Conjunctivae are normal.  Neck: Neck supple.  Cardiovascular: Normal rate.   Pulses:      Posterior tibial pulses are 2+ on the right side, and 2+ on the left side.  Pulmonary/Chest: Effort normal.  Musculoskeletal:       Left knee: She exhibits no ecchymosis, no deformity, no laceration, no erythema, normal alignment and normal patellar mobility. Decreased range of motion: due to pain. Swelling: minimal. Tenderness found. MCL tenderness noted.  LLE: Limited ROM due to pain. Pain to anterior aspect of left knee. Pain to medial aspect of the knee with flexion. No abnormal patellar movement. No high riding patella. With leg lying on stretcher, pt can raise leg off the bed.   Neurological: She is alert.  Skin: Skin is warm and dry.  Psychiatric: She has a normal mood and affect.  Nursing note and vitals reviewed.    ED Treatments / Results  DIAGNOSTIC STUDIES: Oxygen Saturation is 95% on RA, normal by my interpretation.    COORDINATION OF CARE: 5:18 PM- Pt advised of plan for treatment and pt agrees.  Labs (all labs ordered are listed, but only abnormal results are displayed) Labs Reviewed  CBG MONITORING, ED - Abnormal; Notable for the following:       Result Value   Glucose-Capillary 183 (*)    All other components within normal limits   Radiology Dg Knee Complete 4 Views Left  Result Date: 09/11/2016 CLINICAL DATA:  Status post fall today after left knee gave out EXAM: LEFT KNEE - COMPLETE 4+ VIEW COMPARISON:  None. FINDINGS: No evidence of fracture, dislocation. There is a small suprapatellar effusion. Degenerative joint changes with tibial spine osteophytosis and narrowed joint space are noted. IMPRESSION: No acute fracture or dislocation. Electronically Signed   By: Abelardo Diesel M.D.   On: 09/11/2016 18:08    Procedures Procedures (including critical care time)  Medications  Ordered in ED Medications - No data to display   Initial Impression / Assessment and Plan / ED Course  I have reviewed the triage vital signs and the nursing notes.  Pertinent imaging results that were available during my care of the patient were reviewed by me and considered in my medical decision making (see chart for details).  Patient X-Ray negative for obvious fracture or dislocation. Pain managed in ED. Pt advised to follow up with orthopedics if symptoms persist for possibility of missed fracture diagnosis. Patient given brace while in ED and crutches, conservative therapy recommended and discussed. Patient will be dc home & is agreeable with above plan. Stable for  D/C without focal neuro deficits. She will call for appointment with ortho. She will return here as needed.   Final Clinical Impressions(s) / ED Diagnoses   Final diagnoses:  Acute pain  of left knee    New Prescriptions Discharge Medication List as of 09/11/2016  6:34 PM     I personally performed the services described in this documentation, which was scribed in my presence. The recorded information has been reviewed and is accurate.     [place], NP 09/11/16 1909    Daleen Bo, MD 09/12/16 [phone]

## 2016-09-11 NOTE — Discharge Instructions (Signed)
Elevate the area as often as possible, apply ice, take tylenol and ibuprofen as needed for pain. Call Dr. Ruthe Mannan office for follow up. Return here as needed.

## 2016-09-11 NOTE — ED Triage Notes (Signed)
Pt was getting out of car and left leg gave out on her and now c/o left leg pain and left knee pain. Denies fall or injury. nad.

## 2016-09-11 NOTE — ED Notes (Signed)
Ice given to pt. 

## 2016-09-11 NOTE — ED Notes (Signed)
Pt made aware to return if symptoms worsen or if any life threatening symptoms occur.   

## 2016-09-11 NOTE — ED Provider Notes (Signed)
  Face-to-face evaluation   History: Patient twisted her left knee, getting out of a car.  She did not fall.  She has been unable to bear weight on it since.  She drove her vehicle here, for evaluation.  Physical exam: Alert, calm, uncomfortable.  Left knee tender medially without deformity or gross effusion.  She is able to extend the left leg, to lift the entire leg off the stretcher.  Medical screening examination/treatment/procedure(s) were conducted as a shared visit with non-physician practitioner(s) and myself.  I personally evaluated the patient during the encounter   Daleen Bo, MD 09/12/16 [phone]

## 2016-09-22 ENCOUNTER — Encounter: Payer: Self-pay | Admitting: Orthopedic Surgery

## 2016-09-22 ENCOUNTER — Ambulatory Visit (INDEPENDENT_AMBULATORY_CARE_PROVIDER_SITE_OTHER): Payer: Medicare HMO | Admitting: Orthopedic Surgery

## 2016-09-22 VITALS — BP 155/85 | HR 74 | Wt 206.0 lb

## 2016-09-22 DIAGNOSIS — S83242A Other tear of medial meniscus, current injury, left knee, initial encounter: Secondary | ICD-10-CM

## 2016-09-22 DIAGNOSIS — M1712 Unilateral primary osteoarthritis, left knee: Secondary | ICD-10-CM | POA: Diagnosis not present

## 2016-09-22 NOTE — Progress Notes (Signed)
Patient ID: Samantha [name], female   DOB: 1945/11/07, 71 y.o.   MRN: [id number]   Chief Complaint  Patient presents with  . Knee Pain    LEFT KNEE PAIN    Samantha [name] is a 71 y.o. female.   HPI This is a 71 year old female who got out of a car on April 2 twisted her left knee felt a tearing sensation over the medial joint line in extreme pain which progressed to swelling and then over the last 2 weeks has not been able to ambulate without a significant limp  She took ibuprofen apply ice to the knee as well as taking some Tylenol and she has not gotten any relief Review of Systems Review of Systems  Respiratory: Negative for shortness of breath.   Cardiovascular: Negative for chest pain.    Past Medical History:  Diagnosis Date  . Diabetes mellitus   . Hyperlipidemia   . Hypertension     Past Surgical History:  Procedure Laterality Date  . TUBAL LIGATION      No Known Allergies  Current Outpatient Prescriptions  Medication Sig Dispense Refill  . acetaminophen (TYLENOL) 500 MG tablet Take 1,000 mg by mouth every 6 (six) hours as needed for mild pain.    [name] Kitchen amLODipine (NORVASC) 5 MG tablet Take 5 mg by mouth 2 (two) times daily.    [name] Kitchen aspirin EC 81 MG tablet Take 81 mg by mouth daily.    . cloNIDine (CATAPRES) 0.1 MG tablet Take 0.1 mg by mouth 2 (two) times daily.    [name] Kitchen etodolac (LODINE) 400 MG tablet Take 400 mg by mouth 2 (two) times daily.    [name] Kitchen ibuprofen (ADVIL,MOTRIN) 600 MG tablet Take 1 tablet (600 mg total) by mouth every 6 (six) hours as needed. 30 tablet 0  . Linagliptin (TRADJENTA PO) Take by mouth.    [name] Kitchen lisinopril-hydrochlorothiazide (PRINZIDE,ZESTORETIC) 20-12.5 MG per tablet Take 2 tablets by mouth daily.    . potassium chloride SA (K-DUR,KLOR-CON) 20 MEQ tablet Take 20 mEq by mouth daily.     No current facility-administered medications for this visit.      Physical Exam BP (!) 155/85   Pulse 74   Wt 206 lb (93.4 kg)   BMI 38.92 kg/m  Physical Exam   The  patient is well developed well nourished and well groomed.   Orientation to person place and time is normal   Mood is pleasant. Affect normal  Ambulatory status is remarkable for a limp in the involved extremity         Left Knee examination:  Inspection: Tenderness is noted over the medial joint line   ROM: Is limited by pain with a maximum flexion arc of 110  Stability: Collateral ligaments are stable, the Lachman test and anterior and posterior drawer tests are normal   We do palpate medial joint line tenderness and a positive McMurray's for medial meniscal tear   Motor exam: Grade 5 motor strength in the quadriceps musculature   Skin: Warm dry and intact over the right leg                       Neuro: normal sensation   Vascular: 2+ DP pulse with normal color and no edema.   Currently the right lower extremity and knee examination revealed no tenderness or swelling, full range of motion without contracture subluxation atrophy or tremor. Normal muscle tone no instability and the neurovascular status of the limb is  normal.    Assessment and Plan:  IMAGING: I have read and interpret the xrays as follows:  4 views of the knee were done on 09/11/2016 at [facility] and there was no acute fracture or dislocation report read small effusion with degenerative changes of the tibial spine mild joint space narrowing. After independent review I agree with that report as written.   Diagnosis and treatment:   Osteoarthritis of the knee Acute Torn medial meniscus  Encounter Diagnoses  Name Primary?  . Acute medial meniscus tear of left knee, initial encounter Yes  . Primary osteoarthritis of left knee      Arther Abbott, MD 09/22/2016 10:39 AM

## 2016-09-22 NOTE — Patient Instructions (Signed)
ICE   IBUPROFEN

## 2016-10-02 ENCOUNTER — Ambulatory Visit (HOSPITAL_COMMUNITY)
Admission: RE | Admit: 2016-10-02 | Discharge: 2016-10-02 | Disposition: A | Discharge status: Home / Self Care | Payer: Medicare HMO | Source: Ambulatory Visit | Attending: Orthopedic Surgery | Admitting: Orthopedic Surgery

## 2016-10-02 DIAGNOSIS — X58XXXA Exposure to other specified factors, initial encounter: Secondary | ICD-10-CM | POA: Diagnosis not present

## 2016-10-02 DIAGNOSIS — S83242A Other tear of medial meniscus, current injury, left knee, initial encounter: Secondary | ICD-10-CM | POA: Diagnosis not present

## 2016-10-02 DIAGNOSIS — S83282A Other tear of lateral meniscus, current injury, left knee, initial encounter: Secondary | ICD-10-CM | POA: Diagnosis not present

## 2016-10-02 DIAGNOSIS — M25562 Pain in left knee: Secondary | ICD-10-CM | POA: Diagnosis not present

## 2016-10-06 ENCOUNTER — Ambulatory Visit (INDEPENDENT_AMBULATORY_CARE_PROVIDER_SITE_OTHER): Payer: Medicare HMO | Admitting: Orthopedic Surgery

## 2016-10-06 ENCOUNTER — Encounter: Payer: Self-pay | Admitting: Orthopedic Surgery

## 2016-10-06 DIAGNOSIS — M1712 Unilateral primary osteoarthritis, left knee: Secondary | ICD-10-CM | POA: Diagnosis not present

## 2016-10-06 DIAGNOSIS — S83242A Other tear of medial meniscus, current injury, left knee, initial encounter: Secondary | ICD-10-CM

## 2016-10-06 DIAGNOSIS — M23301 Other meniscus derangements, unspecified lateral meniscus, left knee: Secondary | ICD-10-CM

## 2016-10-06 NOTE — Progress Notes (Signed)
Patient ID: Samantha [name], female   DOB: Oct 10, 1945, 71 y.o.   MRN: [id number]  Chief Complaint  Patient presents with  . Follow-up    MRI results of left knee.    HPI This is a 71 year old female who got out of a car on April 2 twisted her left knee felt a tearing sensation over the medial joint line in extreme pain which progressed to swelling and then over the last 2 weeks has not been able to ambulate without a significant limp  She now presents back for MRI follow-up.  She says her knee feels much better and only has difficulty if she twists or turns on it suddenly  ROS  No catching locking or giving way of the knee at this time  There were no vitals taken for this visit. Gen. appearance is normal grooming and hygiene Orientation to person place and time normal Mood normal Gait is normal  No peripheral edema or swelling is noted in the lower extremities Her knee flexes extends normally with no swelling in the joint Sensory exam shows normal sensation to palpation, pressure and soft touch Skin exam no lacerations ulcerations or erythema  Ortho Exam   A/P  Medical decision-making  MRI I reviewed and report and I think that she has a medial and lateral meniscal tear and 3 compartment arthrosis  Report was read as follows  IMPRESSION: 1. Radial tear of the anterior horn of the lateral meniscus. 2. Small radial tear of the free edge of the body of the medial meniscus. 3. Tricompartmental cartilage abnormalities as described above. Findings consistent with tricompartmental osteoarthritis. 4. Large joint effusion.     Electronically Signed   By: Kathreen Devoid   On: 10/02/2016 13:07  Encounter Diagnoses  Name Primary?  . Primary osteoarthritis of left knee Yes  . Acute medial meniscus tear of left knee, initial encounter   . Derangement of lateral meniscus of left knee     After discussing with her I agree that she can continue nonoperative management. She will do  home exercises with a home exercise program that I gave her and she will go to the pool for therapy  Follow-up as needed Arther Abbott, MD 10/06/2016 11:04 AM

## 2016-11-07 DIAGNOSIS — Z7984 Long term (current) use of oral hypoglycemic drugs: Secondary | ICD-10-CM | POA: Diagnosis not present

## 2016-11-07 DIAGNOSIS — I10 Essential (primary) hypertension: Secondary | ICD-10-CM | POA: Diagnosis not present

## 2016-11-07 DIAGNOSIS — H11133 Conjunctival pigmentations, bilateral: Secondary | ICD-10-CM | POA: Diagnosis not present

## 2016-11-07 DIAGNOSIS — H59812 Chorioretinal scars after surgery for detachment, left eye: Secondary | ICD-10-CM | POA: Diagnosis not present

## 2016-11-07 DIAGNOSIS — H35033 Hypertensive retinopathy, bilateral: Secondary | ICD-10-CM | POA: Diagnosis not present

## 2016-11-07 DIAGNOSIS — H18413 Arcus senilis, bilateral: Secondary | ICD-10-CM | POA: Diagnosis not present

## 2016-11-07 DIAGNOSIS — H11153 Pinguecula, bilateral: Secondary | ICD-10-CM | POA: Diagnosis not present

## 2016-11-07 DIAGNOSIS — E113293 Type 2 diabetes mellitus with mild nonproliferative diabetic retinopathy without macular edema, bilateral: Secondary | ICD-10-CM | POA: Diagnosis not present

## 2016-11-07 DIAGNOSIS — H524 Presbyopia: Secondary | ICD-10-CM | POA: Diagnosis not present

## 2016-11-16 DIAGNOSIS — E782 Mixed hyperlipidemia: Secondary | ICD-10-CM | POA: Diagnosis not present

## 2016-11-16 DIAGNOSIS — E119 Type 2 diabetes mellitus without complications: Secondary | ICD-10-CM | POA: Diagnosis not present

## 2016-11-16 DIAGNOSIS — M130 Polyarthritis, unspecified: Secondary | ICD-10-CM | POA: Diagnosis not present

## 2016-11-16 DIAGNOSIS — I10 Essential (primary) hypertension: Secondary | ICD-10-CM | POA: Diagnosis not present

## 2016-12-14 ENCOUNTER — Encounter: Payer: Self-pay | Admitting: Internal Medicine

## 2016-12-25 ENCOUNTER — Ambulatory Visit (HOSPITAL_COMMUNITY)
Admission: RE | Admit: 2016-12-25 | Discharge: 2016-12-25 | Disposition: A | Discharge status: Home / Self Care | Payer: Medicare HMO | Source: Ambulatory Visit | Attending: Family Medicine | Admitting: Family Medicine

## 2016-12-25 ENCOUNTER — Other Ambulatory Visit (HOSPITAL_COMMUNITY): Payer: Self-pay | Admitting: Family Medicine

## 2016-12-25 DIAGNOSIS — S43421A Sprain of right rotator cuff capsule, initial encounter: Secondary | ICD-10-CM

## 2016-12-25 DIAGNOSIS — W16212A Fall in (into) filled bathtub causing other injury, initial encounter: Secondary | ICD-10-CM | POA: Insufficient documentation

## 2016-12-25 DIAGNOSIS — S4991XA Unspecified injury of right shoulder and upper arm, initial encounter: Secondary | ICD-10-CM | POA: Diagnosis not present

## 2016-12-25 DIAGNOSIS — M19011 Primary osteoarthritis, right shoulder: Secondary | ICD-10-CM | POA: Insufficient documentation

## 2017-01-01 ENCOUNTER — Ambulatory Visit (HOSPITAL_COMMUNITY): Payer: Medicare HMO | Attending: Orthopedic Surgery

## 2017-01-01 ENCOUNTER — Encounter (HOSPITAL_COMMUNITY): Payer: Self-pay

## 2017-01-01 DIAGNOSIS — R29898 Other symptoms and signs involving the musculoskeletal system: Secondary | ICD-10-CM

## 2017-01-01 DIAGNOSIS — M25512 Pain in left shoulder: Secondary | ICD-10-CM | POA: Insufficient documentation

## 2017-01-01 DIAGNOSIS — M25612 Stiffness of left shoulder, not elsewhere classified: Secondary | ICD-10-CM

## 2017-01-01 NOTE — Therapy (Signed)
[place], [place], [address] Phone: [phone]   Fax:  [phone]  Occupational Therapy Evaluation  Patient Details  Name: Samantha [name] MRN: [id number] Date of Birth: 08/15/1945 Referring Provider: Arther Abbott, MD  Encounter Date: 01/01/2017      OT End of Session - 01/01/17 1632    Visit Number 1   Number of Visits 8   Date for OT Re-Evaluation 01/31/17   Authorization Type Medicare - $40 copay   Authorization Time Period before 10th visit   Authorization - Visit Number 1   Authorization - Number of Visits 10   OT Start Time 1445  Patient arrived late to session   OT Stop Time 1517   OT Time Calculation (min) 32 min   Activity Tolerance Patient tolerated treatment well   Behavior During Therapy [facility] for tasks assessed/performed      Past Medical History:  Diagnosis Date  . Diabetes mellitus   . Hyperlipidemia   . Hypertension     Past Surgical History:  Procedure Laterality Date  . TUBAL LIGATION      There were no vitals filed for this visit.      Subjective Assessment - 01/01/17 1623    Subjective  S: I just really want the pain to go away.   Pertinent History Patient is a 71 y/o female presenting with a right shoulder strain which occured on 12/10/16 after a mechanical fall in bathtub. Dr. Aline Brochure has referred patient to occupational therapy for evalution and treatment.   Special Tests FOTO score: 63/100   Patient Stated Goals Pt reports she would like to decrease her pain.   Currently in Pain? Yes   Pain Score 2    Pain Location Shoulder   Pain Orientation Right   Pain Descriptors / Indicators Constant;Dull;Aching   Pain Type Acute pain   Pain Radiating Towards wrist   Pain Onset 1 to 4 weeks ago   Pain Frequency Constant   Aggravating Factors  n/a   Pain Relieving Factors pain medication   Effect of Pain on Daily Activities moderate effect   Multiple Pain Sites No            [facility] OT Assessment - 01/01/17 1440      Assessment   Diagnosis Right shoulder sprain   Referring Provider Arther Abbott, MD   Onset Date 12/10/16   Assessment Follow up appointment 02/01/17   Prior Therapy None     Precautions   Precautions None     Restrictions   Weight Bearing Restrictions No     Balance Screen   Has the patient fallen in the past 6 months Yes   How many times? 3   Has the patient had a decrease in activity level because of a fear of falling?  No   Is the patient reluctant to leave their home because of a fear of falling?  No     Home  Environment   Family/patient expects to be discharged to: Private residence   Lives With Alone     Prior Function   Level of [place] time employment   [place], singing in choir     ADL   ADL comments Pt states having problems with clipping her bra, washing her back, and reaching overhead.      Mobility   Mobility Status Independent     Written Expression  Dominant Hand Right     Vision - History   Baseline Vision Wears glasses all the time     Cognition   Overall Cognitive Status Within Functional Limits for tasks assessed     ROM / Strength   AROM / PROM / Strength AROM;PROM;Strength     Palpation   Palpation comment Mod fascial restrictions in right upper arm, trapezius, and scapularis region.      AROM   Overall AROM  Deficits   Overall AROM Comments Assessed seated. IR/er adducted.   AROM Assessment Site Shoulder   Right/Left Shoulder Right   Right Shoulder Flexion 140 Degrees   Right Shoulder ABduction 138 Degrees   Right Shoulder Internal Rotation 90 Degrees   Right Shoulder External Rotation 85 Degrees     PROM   Overall PROM  Within functional limits for tasks performed   Overall PROM Comments Assessed supine. IR/er adducted.   PROM Assessment Site Shoulder   Right/Left Shoulder Right   Right  Shoulder Flexion 154 Degrees   Right Shoulder ABduction 165 Degrees   Right Shoulder Internal Rotation 90 Degrees   Right Shoulder External Rotation 90 Degrees     Strength   Overall Strength Deficits   Overall Strength Comments Assessed seated. IR/er adducted.   Strength Assessment Site Shoulder   Right/Left Shoulder Right   Right Shoulder Flexion 4-/5   Right Shoulder ABduction 4+/5   Right Shoulder Internal Rotation 4+/5   Right Shoulder External Rotation 3+/5            OT Education - 01/01/17 1631    Education provided Yes   Education Details Educated pt on need for therapy due to limitations with right arm; provided pt with handout for shoulder stretches   Person(s) Educated Patient   Methods Explanation;Demonstration;Verbal cues;Handout   Comprehension Verbalized understanding;Returned demonstration          OT Short Term Goals - 01/01/17 1640      OT SHORT TERM GOAL #1   Title Patient will be educated and independent with HEP to increase functional use of RUE as dominant during daily and leisure tasks.   Time 4   Period Weeks   Status New     OT SHORT TERM GOAL #2   Title Patient will report no pain on a consistent basis when using RUE during daily tasks.   Time 4   Period Weeks   Status New     OT SHORT TERM GOAL #3   Title Patient will decrease fascial restrictions to min amount in RUE to increase functional mobility needed for completing overhead reaching activities.    Time 4   Period Weeks   Status New     OT SHORT TERM GOAL #4   Title Patient will increase A/ROM to WNL to increase ability to complete dressing tasks with increased comfort level.   Time 4   Period Weeks   Status New     OT SHORT TERM GOAL #5   Title Patient will increase RUE strength to 5/5 to increase ability to complete cooking tasks with greater ease.   Time 4   Period Weeks   Status New     Additional Short Term Goals   Additional Short Term Goals Yes     OT SHORT  TERM GOAL #6   Title Patient will return to highest level of independence with all daily, leisure, and work tasks using her RUE as dominant.   Time 4   Period Weeks  Status New           Plan - 01/01/17 1634    Clinical Impression Statement A: Patient is a 71 y/o female presenting with a right shoulder strain causing increased pain, fascial restrictions and decreased strength and ROM resulting in difficulty completing all work, daily, and leisure tasks.    Occupational Profile and client history currently impacting functional performance Motivated to get better to continue working   Occupational performance deficits (Please refer to evaluation for details): ADL's;IADL's;Rest and Sleep;Work;Leisure   Rehab Potential Good   Current Impairments/barriers affecting progress: History of falls   OT Frequency 2x / week   OT Duration 4 weeks   OT Treatment/Interventions Self-care/ADL training;Therapeutic exercise;Patient/family education;Ultrasound;Manual Therapy;Iontophoresis;Cryotherapy;DME and/or AE instruction;Therapeutic activities;Electrical Stimulation;Moist Heat;Passive range of motion   Plan P: Patient will benefit from skilled OT services to increase functional performance during daily and work related tasks while using RUE as dominant. Treatment plan: Complete myofascial release, manual stretching, A/ROM and general strengthening.    Clinical Decision Making Limited treatment options, no task modification necessary   OT Home Exercise Plan 01/01/17: shoulder stretches   Consulted and Agree with Plan of Care Patient      Patient will benefit from skilled therapeutic intervention in order to improve the following deficits and impairments:  Decreased strength, Impaired flexibility, Decreased range of motion, Pain, Increased fascial restricitons, Impaired UE functional use  Visit Diagnosis: Stiffness of left shoulder, not elsewhere classified - Plan: Ot plan of care cert/re-cert  Acute  pain of left shoulder - Plan: Ot plan of care cert/re-cert  Other symptoms and signs involving the musculoskeletal system - Plan: Ot plan of care cert/re-cert      G-Codes - 87/56/43 1656    Functional Assessment Tool Used (Outpatient only) FOTO score: 63/100 (37% impaired)   Functional Limitation Carrying, moving and handling objects   Carrying, Moving and Handling Objects Current Status (P2951) At least 20 percent but less than 40 percent impaired, limited or restricted   Carrying, Moving and Handling Objects Goal Status (O8416) At least 1 percent but less than 20 percent impaired, limited or restricted      Problem List Patient Active Problem List   Diagnosis Date Noted  . 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 DERANGEMENT MENISCUS 12/16/2007  . DIABETES MELLITUS, TYPE II, CONTROLLED 12/18/2006  . HYPERLIPIDEMIA 05/07/2006  . OBESITY NOS 05/07/2006  . ANEMIA-NOS 05/07/2006  . CARPAL TUNNEL SYNDROME 05/07/2006  . HYPERTENSION 05/07/2006    Luther Hearing, OT Student [phone] 01/01/2017, 5:00 PM  [place], [place], [address] Phone: [phone]   Fax:  [phone]  Name: Samantha [name] MRN: [id number] Date of Birth: 15-Nov-1945     This qualified practitioner was present in the room guiding the student in service delivery. Therapy student was participating in the provision of services, and the practitioner was not engaged in treating another patient or doing other tasks at the same time.  Ailene Ravel, OTR/L,CBIS  [phone]

## 2017-01-01 NOTE — Patient Instructions (Signed)
Complete the following exercises 2-3 times a day.  Doorway Stretch  Place each hand opposite each other on the doorway. (You can change where you feel the stretch by moving arms higher or lower.) Step through with one foot and bend front knee until a stretch is felt and hold. Step through with the opposite foot on the next rep. Hold for __10-15___ seconds. Repeat __2__times.      Internal Rotation Across Back  Grab the end of a towel with your affected side, palm facing backwards. Grab the towel with your unaffected side and pull your affected hand across your back until you feel a stretch in the front of your shoulder. If you feel pain, pull just to the pain, do not pull through the pain. Hold. Return your affected arm to your side. Try to keep your hand/arm close to your body during the entire movement.     Hold for 10-15 seconds. Complete 2 times.        Posterior Capsule Stretch   Stand or sit, one arm across body so hand rests over opposite shoulder. Gently push on crossed elbow with other hand until stretch is felt in shoulder of crossed arm. Hold _10-15__ seconds.  Repeat _2__ times per session. Do ___ sessions per day.   Wall Flexion  Slide your arm up the wall or door frame until a stretch is felt in your shoulder . Hold for 10-15 seconds. Complete 2 times     Shoulder Abduction Stretch  Stand side ways by a wall with affected up on wall. Gently step in toward wall to feel stretch. Hold for 10-15 seconds. Complete 2 times.   

## 2017-01-02 ENCOUNTER — Ambulatory Visit (HOSPITAL_COMMUNITY): Payer: Medicare HMO | Admitting: Occupational Therapy

## 2017-01-02 DIAGNOSIS — R29898 Other symptoms and signs involving the musculoskeletal system: Secondary | ICD-10-CM

## 2017-01-02 DIAGNOSIS — M25612 Stiffness of left shoulder, not elsewhere classified: Secondary | ICD-10-CM

## 2017-01-02 DIAGNOSIS — M25512 Pain in left shoulder: Secondary | ICD-10-CM

## 2017-01-02 NOTE — Therapy (Signed)
[place], [place], [address] Phone: [phone]   Fax:  [phone]  Occupational Therapy Treatment  Patient Details  Name: Samantha [name] MRN: [id number] Date of Birth: 1945/11/24 Referring Provider: Arther Abbott, MD  Encounter Date: 01/02/2017      OT End of Session - 01/02/17 1541    Visit Number 2   Number of Visits 8   Date for OT Re-Evaluation 01/31/17   Authorization Type Medicare - $40 copay   Authorization Time Period before 10th visit   Authorization - Visit Number 2   Authorization - Number of Visits 10   OT Start Time 1436   OT Stop Time 1515   OT Time Calculation (min) 39 min   Activity Tolerance Patient tolerated treatment well   Behavior During Therapy [facility] for tasks assessed/performed      Past Medical History:  Diagnosis Date  . Diabetes mellitus   . Hyperlipidemia   . Hypertension     Past Surgical History:  Procedure Laterality Date  . TUBAL LIGATION      There were no vitals filed for this visit.      Subjective Assessment - 01/02/17 1440    Subjective  S: My thumb is bothering me more than anything today.    Currently in Pain? No/denies            [facility] OT Assessment - 01/01/17 1440      Assessment   Diagnosis Right shoulder sprain   Referring Provider Arther Abbott, MD   Onset Date 12/10/16   Assessment Follow up appointment 02/01/17   Prior Therapy None     Precautions   Precautions None     Restrictions   Weight Bearing Restrictions No     Balance Screen   Has the patient fallen in the past 6 months Yes   How many times? 3   Has the patient had a decrease in activity level because of a fear of falling?  No   Is the patient reluctant to leave their home because of a fear of falling?  No     Home  Environment   Family/patient expects to be discharged to: Private residence   Lives With Alone     Prior Function   Level of [place] time employment   [place], singing in choir     ADL   ADL comments Pt states having problems with clipping her bra, washing her back, and reaching overhead.      Mobility   Mobility Status Independent     Written Expression   Dominant Hand Right     Vision - History   Baseline Vision Wears glasses all the time     Cognition   Overall Cognitive Status Within Functional Limits for tasks assessed     ROM / Strength   AROM / PROM / Strength AROM;PROM;Strength     Palpation   Palpation comment Mod fascial restrictions in right upper arm, trapezius, and scapularis region.      AROM   Overall AROM  Deficits   Overall AROM Comments Assessed seated. IR/er adducted.   AROM Assessment Site Shoulder   Right/Left Shoulder Right   Right Shoulder Flexion 140 Degrees   Right Shoulder ABduction 138 Degrees   Right Shoulder Internal Rotation 90 Degrees   Right Shoulder External Rotation 85 Degrees     PROM   Overall  PROM  Within functional limits for tasks performed   Overall PROM Comments Assessed supine. IR/er adducted.   PROM Assessment Site Shoulder   Right/Left Shoulder Right   Right Shoulder Flexion 154 Degrees   Right Shoulder ABduction 165 Degrees   Right Shoulder Internal Rotation 90 Degrees   Right Shoulder External Rotation 90 Degrees     Strength   Overall Strength Deficits   Overall Strength Comments Assessed seated. IR/er adducted.   Strength Assessment Site Shoulder   Right/Left Shoulder Right   Right Shoulder Flexion 4-/5   Right Shoulder ABduction 4+/5   Right Shoulder Internal Rotation 4+/5   Right Shoulder External Rotation 3+/5                  OT Treatments/Exercises (OP) - 01/02/17 1440      Exercises   Exercises Shoulder                                  Shoulder Exercises: Seated   Protraction AROM;10 reps   Horizontal ABduction AROM;10 reps   External  Rotation AROM;10 reps   Internal Rotation AROM;10 reps   Flexion AROM;10 reps   Abduction AROM;10 reps     Shoulder Exercises: Standing   Extension Theraband;10 reps   Theraband Level (Shoulder Extension) Level 2 (Red)   Row Theraband;10 reps   Theraband Level (Shoulder Row) Level 2 (Red)   Retraction Theraband;10 reps   Theraband Level (Shoulder Retraction) Level 2 (Red)     Shoulder Exercises: Pulleys   Flexion 1 minute   ABduction 1 minute     Shoulder Exercises: Stretch   Corner Stretch 3 reps;10 seconds   Internal Rotation Stretch 3 reps  10 seconds   Wall Stretch - Flexion 3 reps;10 seconds     Manual Therapy   Manual Therapy Myofascial release   Manual therapy comments manual therapy completed separately from therapeutic exercises   Myofascial Release Myofascial release to right upper arm, trapezius, and scapularis regions to decrease pain and fascial restrictons and increase joint range of motion                OT Education - 01/01/17 1631    Education provided Yes   Education Details Educated pt on need for therapy due to limitations with right arm; provided pt with handout for shoulder stretches   Person(s) Educated Patient   Methods Explanation;Demonstration;Verbal cues;Handout   Comprehension Verbalized understanding;Returned demonstration          OT Short Term Goals - 01/02/17 1544      OT SHORT TERM GOAL #1   Title Patient will be educated and independent with HEP to increase functional use of RUE as dominant during daily and leisure tasks.   Time 4   Period Weeks   Status On-going     OT SHORT TERM GOAL #2   Title Patient will report no pain on a consistent basis when using RUE during daily tasks.   Time 4   Period Weeks   Status On-going     OT SHORT TERM GOAL #3   Title Patient will decrease fascial restrictions to min amount in RUE to increase functional mobility needed for completing overhead reaching activities.    Time 4   Period  Weeks   Status On-going     OT SHORT TERM GOAL #4   Title Patient will increase A/ROM to WNL to increase ability to  complete dressing tasks with increased comfort level.   Time 4   Period Weeks   Status On-going     OT SHORT TERM GOAL #5   Title Patient will increase RUE strength to 5/5 to increase ability to complete cooking tasks with greater ease.   Time 4   Period Weeks   Status On-going     OT SHORT TERM GOAL #6   Title Patient will return to highest level of independence with all daily, leisure, and work tasks using her RUE as dominant.   Time 4   Period Weeks   Status On-going                  Plan - 01/02/17 1541    Clinical Impression Statement A: Initiated myofascial release, manual therapy, P/ROM, A/ROM, scapular theraband, and pulley exercises. Pt requiring verbal cuing for form and technique, OT notes improvement in ROM since yesterday at evaluation. Pt with pain at approximately 50% range with seated flexion.    Plan P: add wall wash, proximal shoulder strengthening. Update HEP for A/ROM   OT Home Exercise Plan 01/01/17: shoulder stretches   Consulted and Agree with Plan of Care Patient      Patient will benefit from skilled therapeutic intervention in order to improve the following deficits and impairments:  Decreased strength, Impaired flexibility, Decreased range of motion, Pain, Increased fascial restricitons, Impaired UE functional use  Visit Diagnosis: Stiffness of left shoulder, not elsewhere classified  Acute pain of left shoulder  Other symptoms and signs involving the musculoskeletal system   Problem List Patient Active Problem List   Diagnosis Date Noted  . 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 DERANGEMENT MENISCUS 12/16/2007  . DIABETES MELLITUS, TYPE II, CONTROLLED 12/18/2006  . HYPERLIPIDEMIA 05/07/2006  . OBESITY NOS 05/07/2006  . ANEMIA-NOS 05/07/2006  . CARPAL TUNNEL SYNDROME 05/07/2006  . HYPERTENSION 05/07/2006    Guadelupe Sabin, OTR/L  [phone] 01/02/2017, 3:45 PM  [facility] [address] [place], [place], [address] Phone: [phone]   Fax:  [phone]  Name: MESHELL ABDULAZIZ MRN: [id number] Date of Birth: [birth year]

## 2017-01-11 ENCOUNTER — Telehealth (HOSPITAL_COMMUNITY): Payer: Self-pay | Admitting: Student

## 2017-01-11 ENCOUNTER — Ambulatory Visit (HOSPITAL_COMMUNITY): Payer: Medicare HMO

## 2017-01-11 NOTE — Telephone Encounter (Signed)
Called pt regarding missed appointment. Reminded pt of next appointment and told her to call if she could not make it.  Luther Hearing, OT Student [phone]

## 2017-01-15 ENCOUNTER — Telehealth (HOSPITAL_COMMUNITY): Payer: Self-pay | Admitting: Family Medicine

## 2017-01-15 NOTE — Telephone Encounter (Signed)
01/15/17  cx - her mom passed away last week and she has some business to take care of

## 2017-01-16 ENCOUNTER — Ambulatory Visit (HOSPITAL_COMMUNITY): Payer: Medicare HMO

## 2017-01-18 ENCOUNTER — Telehealth (HOSPITAL_COMMUNITY): Payer: Self-pay | Admitting: Occupational Therapy

## 2017-01-18 ENCOUNTER — Ambulatory Visit (HOSPITAL_COMMUNITY): Payer: Medicare HMO | Attending: Orthopedic Surgery | Admitting: Occupational Therapy

## 2017-01-18 DIAGNOSIS — M25612 Stiffness of left shoulder, not elsewhere classified: Secondary | ICD-10-CM | POA: Insufficient documentation

## 2017-01-18 DIAGNOSIS — M25512 Pain in left shoulder: Secondary | ICD-10-CM | POA: Insufficient documentation

## 2017-01-18 DIAGNOSIS — R29898 Other symptoms and signs involving the musculoskeletal system: Secondary | ICD-10-CM | POA: Insufficient documentation

## 2017-01-18 NOTE — Telephone Encounter (Signed)
Called pt regarding no-show. Pt's mother passed away on September 30, 2022 and her sister passed yesterday. Discussed upcoming appointments with pt and canceled for next week.   Guadelupe Sabin, OTR/L  [phone] 01/18/2017

## 2017-01-22 ENCOUNTER — Ambulatory Visit (HOSPITAL_COMMUNITY): Payer: Medicare HMO

## 2017-01-24 ENCOUNTER — Ambulatory Visit (HOSPITAL_COMMUNITY): Payer: Medicare HMO | Admitting: Occupational Therapy

## 2017-01-29 ENCOUNTER — Ambulatory Visit (HOSPITAL_COMMUNITY): Payer: Medicare HMO

## 2017-01-29 ENCOUNTER — Telehealth (HOSPITAL_COMMUNITY): Payer: Self-pay | Admitting: Family Medicine

## 2017-01-29 DIAGNOSIS — F4320 Adjustment disorder, unspecified: Secondary | ICD-10-CM | POA: Diagnosis not present

## 2017-01-29 DIAGNOSIS — S43421D Sprain of right rotator cuff capsule, subsequent encounter: Secondary | ICD-10-CM | POA: Diagnosis not present

## 2017-01-29 DIAGNOSIS — E782 Mixed hyperlipidemia: Secondary | ICD-10-CM | POA: Diagnosis not present

## 2017-01-29 NOTE — Telephone Encounter (Signed)
01/29/17  Pt called to cx because she said she had a drs appt at 2:30

## 2017-01-31 ENCOUNTER — Telehealth (HOSPITAL_COMMUNITY): Payer: Self-pay | Admitting: Occupational Therapy

## 2017-01-31 ENCOUNTER — Ambulatory Visit (HOSPITAL_COMMUNITY): Payer: Medicare HMO | Admitting: Occupational Therapy

## 2017-01-31 NOTE — Telephone Encounter (Signed)
Called pt regarding no-show. Pt reports she had to work late and was unable to call. Rescheduled pt for tomorrow.  Guadelupe Sabin, OTR/L  [phone] 01/31/2017

## 2017-02-01 ENCOUNTER — Ambulatory Visit (HOSPITAL_COMMUNITY): Payer: Medicare HMO | Admitting: Occupational Therapy

## 2017-02-01 ENCOUNTER — Encounter (HOSPITAL_COMMUNITY): Payer: Self-pay | Admitting: Occupational Therapy

## 2017-02-01 DIAGNOSIS — M25612 Stiffness of left shoulder, not elsewhere classified: Secondary | ICD-10-CM | POA: Diagnosis not present

## 2017-02-01 DIAGNOSIS — R29898 Other symptoms and signs involving the musculoskeletal system: Secondary | ICD-10-CM

## 2017-02-01 DIAGNOSIS — M25512 Pain in left shoulder: Secondary | ICD-10-CM | POA: Diagnosis not present

## 2017-02-01 NOTE — Patient Instructions (Signed)
  1) Flexion Wall Stretch    Face wall, place affected handon wall in front of you. Slide hand up the wall  and lean body in towards the wall. Hold for 10 seconds. Repeat 3-5 times. 1-2 times/day.      2) Corner Stretch    Stand at a corner of a wall, place your arms on the walls with elbows bent. Lean into the corner until a stretch is felt along the front of your chest and/or shoulders. Hold for 10 seconds. Repeat 3-5X, 1-2 times/day.    3) Posterior Capsule Stretch    Bring the involved arm across chest. Grasp elbow and pull toward chest until you feel a stretch in the back of the upper arm and shoulder. Hold 10 seconds. Repeat 3-5X. Complete 1-2 times/day.     Strengthening: Chest Pull - Resisted   Hold Theraband in front of body with hands about shoulder width a part. Pull band a part and back together slowly. Repeat __10__ times. Complete _1___ set(s) per session.. Repeat __1__ session(s) per day.  http://orth.exer.us/926   Copyright  VHI. All rights reserved.   PNF Strengthening: Resisted   Standing with resistive band around each hand, bring right arm up and away, thumb back. Repeat __10__ times per set. Do __1__ sets per session. Do __1__ sessions per day.                           Resisted External Rotation: in Neutral - Bilateral   Sit or stand, tubing in both hands, elbows at sides, bent to 90, forearms forward. Pinch shoulder blades together and rotate forearms out. Keep elbows at sides. Repeat __10__ times per set. Do _1___ sets per session. Do _1___ sessions per day.  http://orth.exer.us/966   Copyright  VHI. All rights reserved.   PNF Strengthening: Resisted   Standing, hold resistive band above head. Bring right arm down and out from side. Repeat _10___ times per set. Do __1__ sets per session. Do _1___ sessions per day.  http://orth.exer.us/922   Copyright  VHI. All rights reserved.

## 2017-02-01 NOTE — Therapy (Signed)
[place], [place], [address] Phone: [phone]   Fax:  [phone]  Occupational Therapy Reassessment, Treatment, Discharge  Patient Details  Name: Samantha [name] MRN: [id number] Date of Birth: 07-09-1945 Referring Provider: Arther Abbott, MD  Encounter Date: 02/01/2017      OT End of Session - 02/01/17 1553    Visit Number 3   Number of Visits 8   Date for OT Re-Evaluation 01/31/17   Authorization Type Medicare - $40 copay   Authorization Time Period before 10th visit   Authorization - Visit Number 3   Authorization - Number of Visits 10   OT Start Time 1521   OT Stop Time 1550   OT Time Calculation (min) 29 min   Activity Tolerance Patient tolerated treatment well   Behavior During Therapy [facility] for tasks assessed/performed      Past Medical History:  Diagnosis Date  . Diabetes mellitus   . Hyperlipidemia   . Hypertension     Past Surgical History:  Procedure Laterality Date  . TUBAL LIGATION      There were no vitals filed for this visit.      Subjective Assessment - 02/01/17 1551    Subjective  S: I've been feeling good, not having much pain now.    Currently in Pain? No/denies            [facility] OT Assessment - 02/01/17 1523      Assessment   Diagnosis Right shoulder sprain     Precautions   Precautions None     Palpation   Palpation comment Min fascial restrictions in right upper arm     AROM   Overall AROM  Within functional limits for tasks performed   Overall AROM Comments Assessed seated. IR/er adducted.   AROM Assessment Site Shoulder   Right/Left Shoulder Right   Right Shoulder Flexion 150 Degrees  140 previous   Right Shoulder ABduction 180 Degrees  138 previous   Right Shoulder Internal Rotation 90 Degrees  same as previous   Right Shoulder External Rotation 85 Degrees  same as previous     PROM   Overall PROM  Within functional limits for tasks performed   Overall PROM Comments Assessed supine. IR/er adducted.   PROM Assessment Site Shoulder   Right/Left Shoulder Right   Right Shoulder Flexion 165 Degrees  154 previous   Right Shoulder ABduction 180 Degrees  165 previous   Right Shoulder Internal Rotation 90 Degrees  same as previous   Right Shoulder External Rotation 90 Degrees  same as previous     Strength   Overall Strength Within functional limits for tasks performed   Overall Strength Comments Assessed seated. IR/er adducted.   Strength Assessment Site Shoulder   Right/Left Shoulder Right   Right Shoulder Flexion 5/5  4-/5 previous   Right Shoulder ABduction 5/5  4+/5 previous   Right Shoulder Internal Rotation 5/5  4+/5 previous   Right Shoulder External Rotation 4+/5  3+/5 previous                  OT Treatments/Exercises (OP) - 02/01/17 1552      Exercises   Exercises Shoulder     Shoulder Exercises: Standing   Other Standing Exercises green theraband: PNF patterns, er/IR, and horizontal abduction, 5X each     Shoulder Exercises: Stretch   Corner Stretch 3 reps;10 seconds   Cross Chest Stretch 2 reps;10 seconds   Wall Stretch -  Flexion 3 reps;10 seconds                OT Education - 02-18-17 1553    Education provided Yes   Education Details shoulder stretches, green theraband strengthening.    Person(s) Educated Patient   Methods Explanation;Demonstration;Handout   Comprehension Returned demonstration;Verbalized understanding          OT Short Term Goals - 02-18-17 1535      OT SHORT TERM GOAL #1   Title Patient will be educated and independent with HEP to increase functional use of RUE as dominant during daily and leisure tasks.   Time 4   Period Weeks   Status Achieved     OT SHORT TERM GOAL #2   Title Patient will report no pain on a consistent basis when using RUE during daily tasks.   Time 4   Period Weeks   Status Achieved     OT SHORT TERM GOAL #3   Title Patient  will decrease fascial restrictions to min amount in RUE to increase functional mobility needed for completing overhead reaching activities.    Time 4   Period Weeks   Status Achieved     OT SHORT TERM GOAL #4   Title Patient will increase A/ROM to WNL to increase ability to complete dressing tasks with increased comfort level.   Time 4   Period Weeks   Status Achieved     OT SHORT TERM GOAL #5   Title Patient will increase RUE strength to 5/5 to increase ability to complete cooking tasks with greater ease.   Time 4   Period Weeks   Status Partially Met     OT SHORT TERM GOAL #6   Title Patient will return to highest level of independence with all daily, leisure, and work tasks using her RUE as dominant.   Time 4   Period Weeks   Status Achieved                  Plan - 2017/02/18 1554    Clinical Impression Statement A: Reassessment completed this session, pt has met 5/6 goals and has partially met the remaining goal. Pt has only attended 2 therapy sessions due to extenuating circumstances in the past month. Pt reports she has no pain except when trying to sleep. Educated pt on pain management to facilitate sleeping, and provided handout for shoulder stretches to continues at home. Pt is agreeable to discharge.    Plan P: Discharge pt   OT Home Exercise Plan 01/01/17: shoulder stretches   Consulted and Agree with Plan of Care Patient      Patient will benefit from skilled therapeutic intervention in order to improve the following deficits and impairments:  Decreased strength, Impaired flexibility, Decreased range of motion, Pain, Increased fascial restricitons, Impaired UE functional use  Visit Diagnosis: Stiffness of left shoulder, not elsewhere classified  Acute pain of left shoulder  Other symptoms and signs involving the musculoskeletal system      G-Codes - 02/18/17 1558    Functional Assessment Tool Used (Outpatient only) FOTO score: 69/100 (31% impaired)    Functional Limitation Carrying, moving and handling objects   Carrying, Moving and Handling Objects Goal Status (Q9450) At least 1 percent but less than 20 percent impaired, limited or restricted   Carrying, Moving and Handling Objects Discharge Status [phone]) At least 20 percent but less than 40 percent impaired, limited or restricted      Problem List Patient Active Problem  List   Diagnosis Date Noted  . 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 DERANGEMENT MENISCUS 12/16/2007  . DIABETES MELLITUS, TYPE II, CONTROLLED 12/18/2006  . HYPERLIPIDEMIA 05/07/2006  . OBESITY NOS 05/07/2006  . ANEMIA-NOS 05/07/2006  . CARPAL TUNNEL SYNDROME 05/07/2006  . HYPERTENSION 05/07/2006   Guadelupe Sabin, OTR/L  [phone] 02/01/2017, 3:59 PM  [facility] [address] [place], [place], [address] Phone: [phone]   Fax:  [phone]  Name: Samantha [name] MRN: [id number] Date of Birth: March 20, 1946    OCCUPATIONAL THERAPY DISCHARGE SUMMARY  Visits from Start of Care: 3  Current functional level related to goals / functional outcomes: See above. Pt reports she is using RUE as dominant in all B/IADL tasks with no difficulty.    Remaining deficits: Pt has discomfort at night when trying to sleep.    Education / Equipment: Provided pt with shoulder stretches and green theraband strengthening exercises Plan: Patient agrees to discharge.  Patient goals were met. Patient is being discharged due to meeting the stated rehab goals.  ?????

## 2017-06-06 DIAGNOSIS — E1165 Type 2 diabetes mellitus with hyperglycemia: Secondary | ICD-10-CM | POA: Diagnosis not present

## 2017-06-06 DIAGNOSIS — I10 Essential (primary) hypertension: Secondary | ICD-10-CM | POA: Diagnosis not present

## 2017-06-06 DIAGNOSIS — E785 Hyperlipidemia, unspecified: Secondary | ICD-10-CM | POA: Diagnosis not present

## 2017-06-06 DIAGNOSIS — E782 Mixed hyperlipidemia: Secondary | ICD-10-CM | POA: Diagnosis not present

## 2017-07-04 DIAGNOSIS — E1165 Type 2 diabetes mellitus with hyperglycemia: Secondary | ICD-10-CM | POA: Diagnosis not present

## 2017-07-04 DIAGNOSIS — I10 Essential (primary) hypertension: Secondary | ICD-10-CM | POA: Diagnosis not present

## 2017-08-06 DIAGNOSIS — J069 Acute upper respiratory infection, unspecified: Secondary | ICD-10-CM | POA: Diagnosis not present

## 2017-08-06 DIAGNOSIS — E118 Type 2 diabetes mellitus with unspecified complications: Secondary | ICD-10-CM | POA: Diagnosis not present

## 2017-08-27 DIAGNOSIS — E1165 Type 2 diabetes mellitus with hyperglycemia: Secondary | ICD-10-CM | POA: Diagnosis not present

## 2017-08-27 DIAGNOSIS — Z0000 Encounter for general adult medical examination without abnormal findings: Secondary | ICD-10-CM | POA: Diagnosis not present

## 2017-08-27 DIAGNOSIS — J209 Acute bronchitis, unspecified: Secondary | ICD-10-CM | POA: Diagnosis not present

## 2017-08-30 ENCOUNTER — Encounter (HOSPITAL_COMMUNITY): Payer: Self-pay

## 2017-08-30 ENCOUNTER — Emergency Department (HOSPITAL_COMMUNITY)
Admission: EM | Admit: 2017-08-30 | Discharge: 2017-08-30 | Disposition: A | Discharge status: Home / Self Care | Payer: Medicare HMO | Attending: Emergency Medicine | Admitting: Emergency Medicine

## 2017-08-30 ENCOUNTER — Emergency Department (HOSPITAL_COMMUNITY): Payer: Medicare HMO

## 2017-08-30 DIAGNOSIS — I10 Essential (primary) hypertension: Secondary | ICD-10-CM | POA: Diagnosis not present

## 2017-08-30 DIAGNOSIS — Z7982 Long term (current) use of aspirin: Secondary | ICD-10-CM | POA: Diagnosis not present

## 2017-08-30 DIAGNOSIS — M545 Low back pain: Secondary | ICD-10-CM | POA: Diagnosis not present

## 2017-08-30 DIAGNOSIS — M5440 Lumbago with sciatica, unspecified side: Secondary | ICD-10-CM

## 2017-08-30 DIAGNOSIS — R52 Pain, unspecified: Secondary | ICD-10-CM | POA: Diagnosis not present

## 2017-08-30 DIAGNOSIS — M5441 Lumbago with sciatica, right side: Secondary | ICD-10-CM | POA: Diagnosis not present

## 2017-08-30 DIAGNOSIS — Z79899 Other long term (current) drug therapy: Secondary | ICD-10-CM | POA: Insufficient documentation

## 2017-08-30 DIAGNOSIS — R109 Unspecified abdominal pain: Secondary | ICD-10-CM | POA: Diagnosis not present

## 2017-08-30 DIAGNOSIS — E119 Type 2 diabetes mellitus without complications: Secondary | ICD-10-CM | POA: Insufficient documentation

## 2017-08-30 DIAGNOSIS — M549 Dorsalgia, unspecified: Secondary | ICD-10-CM | POA: Diagnosis present

## 2017-08-30 LAB — URINALYSIS, ROUTINE W REFLEX MICROSCOPIC
Bilirubin Urine: NEGATIVE
GLUCOSE, UA: NEGATIVE mg/dL
KETONES UR: NEGATIVE mg/dL
NITRITE: POSITIVE — AB
PH: 5 (ref 5.0–8.0)
Protein, ur: 100 mg/dL — AB
Specific Gravity, Urine: 1.018 (ref 1.005–1.030)

## 2017-08-30 MED ORDER — OXYCODONE-ACETAMINOPHEN 5-325 MG PO TABS: 1.0000 | ORAL_TABLET | Freq: Four times a day (QID) | ORAL | 0 refills | Status: DC | PRN

## 2017-08-30 MED ORDER — HYDROMORPHONE HCL 1 MG/ML IJ SOLN
1.0000 mg | Freq: Once | INTRAMUSCULAR | Status: AC
  Administered 2017-08-30: 1 mg via INTRAMUSCULAR
  Filled 2017-08-30: qty 1

## 2017-08-30 MED ORDER — METHYLPREDNISOLONE SODIUM SUCC 125 MG IJ SOLR
125.0000 mg | Freq: Once | INTRAMUSCULAR | Status: AC
  Administered 2017-08-30: 125 mg via INTRAVENOUS
  Filled 2017-08-30: qty 2

## 2017-08-30 MED ORDER — HYDROMORPHONE HCL 1 MG/ML IJ SOLN
1.0000 mg | Freq: Once | INTRAMUSCULAR | Status: AC
  Administered 2017-08-30: 1 mg via INTRAVENOUS
  Filled 2017-08-30: qty 1

## 2017-08-30 MED ORDER — ONDANSETRON HCL 4 MG/2ML IJ SOLN
4.0000 mg | Freq: Once | INTRAMUSCULAR | Status: AC
  Administered 2017-08-30: 4 mg via INTRAVENOUS
  Filled 2017-08-30: qty 2

## 2017-08-30 MED ORDER — PREDNISONE 10 MG PO TABS: 20.0000 mg | ORAL_TABLET | Freq: Every day | ORAL | 0 refills | Status: DC

## 2017-08-30 NOTE — ED Notes (Signed)
Patient advised Dr. Roderic Palau she could provide urine specimen.  Went in to assist patient to the bathroom and she stated "I really dont feel like I can pee right now but I will let you know.  I do not want to move unless I know I can pee".  Patient refused in/out cath and advised she would let us know when she could go to the bathroom.

## 2017-08-30 NOTE — ED Notes (Signed)
Attempted to get patient up and to the bathroom.  Patient was not able to stand.  Patient stated she was in too much pain to move.  Patient placed back in the bed.

## 2017-08-30 NOTE — ED Provider Notes (Signed)
[facility] EMERGENCY DEPARTMENT Provider Note   CSN: [id number] Arrival date & time: 08/30/17  1033     History   Chief Complaint Chief Complaint  Patient presents with  . Back Pain    HPI Samantha [name] is a 72 y.o. female.  Patient complains of low back pain radiating down her right leg  The history is provided by the patient. No language interpreter was used.  Back Pain   This is a new problem. The current episode started more than 2 days ago. The problem occurs constantly. The problem has not changed since onset.The pain is associated with no known injury. The pain is present in the lumbar spine. Pertinent negatives include no chest pain, no headaches and no abdominal pain.    Past Medical History:  Diagnosis Date  . Diabetes mellitus   . Hyperlipidemia   . Hypertension     Patient Active Problem List   Diagnosis Date Noted  . 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 DERANGEMENT MENISCUS 12/16/2007  . DIABETES MELLITUS, TYPE II, CONTROLLED 12/18/2006  . HYPERLIPIDEMIA 05/07/2006  . OBESITY NOS 05/07/2006  . ANEMIA-NOS 05/07/2006  . CARPAL TUNNEL SYNDROME 05/07/2006  . HYPERTENSION 05/07/2006    Past Surgical History:  Procedure Laterality Date  . TUBAL LIGATION      OB History   None      Home Medications    Prior to Admission medications   Medication Sig Start Date End Date Taking? Authorizing Provider  acetaminophen (TYLENOL) 500 MG tablet Take 1,000 mg by mouth every 6 (six) hours as needed for mild pain.   Yes [provider]  amLODipine (NORVASC) 5 MG tablet Take 5 mg by mouth 2 (two) times daily.   Yes [provider]  aspirin EC 81 MG tablet Take 81 mg by mouth daily.   Yes [provider]  cloNIDine (CATAPRES) 0.1 MG tablet Take 0.1 mg by mouth 2 (two) times daily.   Yes [provider]  Cyanocobalamin ([employer] B-12) 1500 MCG TBDP Take 1 tablet by mouth daily.   Yes [provider]  ezetimibe  (ZETIA) 10 MG tablet Take 10 mg by mouth daily. 08/29/17  Yes [provider]  guaiFENesin-codeine 100-10 MG/5ML syrup Take 1 - 2 teaspoonsful by mouth every 4 to 6 hours 08/27/17  Yes [provider]  Homeopathic Products ([employer] LEG CRAMPS PAIN RELIEF PO) Take 1 tablet by mouth daily.   Yes [provider]  Linagliptin (TRADJENTA PO) Take 1 tablet by mouth daily.    Yes [provider]  lisinopril-hydrochlorothiazide (PRINZIDE,ZESTORETIC) 20-12.5 MG per tablet Take 2 tablets by mouth daily.   Yes [provider]  Misc Natural Products (GLUCOSAMINE CHOND COMPLEX/MSM PO) Take 1 tablet by mouth daily.   Yes [provider]  NOREL AD 4-10-325 MG TABS 1 BY MOUTH EVER 12 HOURS FOR NASAL CONGESTION 08/07/17  Yes [provider]  potassium chloride SA (K-DUR,KLOR-CON) 20 MEQ tablet Take 20 mEq by mouth daily.   Yes [provider]  oxyCODONE-acetaminophen (PERCOCET/ROXICET) 5-325 MG tablet Take 1 tablet by mouth every 6 (six) hours as needed. 08/30/17   Milton Ferguson, MD  predniSONE (DELTASONE) 10 MG tablet Take 2 tablets (20 mg total) by mouth daily. 08/30/17   Milton Ferguson, MD    Family History No family history on file.  Social History Social History   Tobacco Use  . Smoking status: Never Smoker  . Smokeless tobacco: Never Used  Substance Use Topics  .  Alcohol use: No  . Drug use: No     Allergies   Patient has no known allergies.   Review of Systems Review of Systems  Constitutional: Negative for appetite change and fatigue.  HENT: Negative for congestion, ear discharge and sinus pressure.   Eyes: Negative for discharge.  Respiratory: Negative for cough.   Cardiovascular: Negative for chest pain.  Gastrointestinal: Negative for abdominal pain and diarrhea.  Genitourinary: Negative for frequency and hematuria.  Musculoskeletal: Positive for back pain.  Skin: Negative for rash.  Neurological: Negative for  seizures and headaches.  Psychiatric/Behavioral: Negative for hallucinations.     Physical Exam Updated Vital Signs BP (!) 171/77   Pulse 71   Temp 98.4 F (36.9 C) (Oral)   Resp 15   Ht 5\' 1"  (1.549 m)   Wt 95.3 kg (210 lb)   SpO2 100%   BMI 39.68 kg/m   Physical Exam  Constitutional: She is oriented to person, place, and time. She appears well-developed.  HENT:  Head: Normocephalic.  Eyes: Conjunctivae and EOM are normal. No scleral icterus.  Neck: Neck supple. No thyromegaly present.  Cardiovascular: Normal rate and regular rhythm. Exam reveals no gallop and no friction rub.  No murmur heard. Pulmonary/Chest: No stridor. She has no wheezes. She has no rales. She exhibits no tenderness.  Abdominal: She exhibits no distension. There is no tenderness. There is no rebound.  Musculoskeletal: Normal range of motion. She exhibits no edema.  Tender lumbar spine with positive straight leg raise on the right  Lymphadenopathy:    She has no cervical adenopathy.  Neurological: She is oriented to person, place, and time. She exhibits normal muscle tone. Coordination normal.  Skin: No rash noted. No erythema.  Psychiatric: She has a normal mood and affect. Her behavior is normal.     ED Treatments / Results  Labs (all labs ordered are listed, but only abnormal results are displayed) Labs Reviewed  URINALYSIS, ROUTINE W REFLEX MICROSCOPIC - Abnormal; Notable for the following components:      Result Value   APPearance CLOUDY (*)    Hgb urine dipstick SMALL (*)    Protein, ur 100 (*)    Nitrite POSITIVE (*)    Leukocytes, UA TRACE (*)    Bacteria, UA MANY (*)    Squamous Epithelial / LPF 6-30 (*)    All other components within normal limits    EKG  EKG Interpretation None       Radiology Ct Renal Stone Study  Result Date: 08/30/2017 CLINICAL DATA:  Right flank pain. EXAM: CT ABDOMEN AND PELVIS WITHOUT CONTRAST TECHNIQUE: Multidetector CT imaging of the abdomen and  pelvis was performed following the standard protocol without IV contrast. COMPARISON:  None. FINDINGS: Lower chest: Minimal atelectasis or scarring at the lung bases. Hepatobiliary: 4 mm low-density lesion in the dome of the right lobe of the liver and possible 6 mm low-density lesion in the lateral aspect of the left lobe of the liver, probably representing tiny liver cysts but too small to completely characterize. The liver and biliary tree are otherwise normal. Pancreas: Unremarkable. No pancreatic ductal dilatation or surrounding inflammatory changes. Spleen: Normal in size without focal abnormality. Adrenals/Urinary Tract: Adrenal glands are unremarkable. Kidneys are normal, without renal calculi, focal lesion, or hydronephrosis. Bladder is unremarkable. Stomach/Bowel: Stomach is within normal limits. Appendix appears normal. No evidence of bowel wall thickening, distention, or inflammatory changes. Vascular/Lymphatic: No significant vascular findings are present. No enlarged abdominal or pelvic lymph nodes.  Reproductive: Uterus and bilateral adnexa are unremarkable. Other: No abdominal wall hernia or abnormality. No abdominopelvic ascites. Musculoskeletal: No acute abnormality. Moderately severe to severe spinal stenosis at L3-4 and L4-5. Broad-based disc protrusion at L5-S1. Moderately severe facet arthritis in the lower lumbar spine. IMPRESSION: No acute abnormalities. Specifically, no evidence of renal or ureteral or bladder calculi. Electronically Signed   By: Lorriane Shire M.D.   On: 08/30/2017 13:36    Procedures Procedures (including critical care time)  Medications Ordered in ED Medications  HYDROmorphone (DILAUDID) injection 1 mg (1 mg Intramuscular Given 08/30/17 1255)  methylPREDNISolone sodium succinate (SOLU-MEDROL) 125 mg/2 mL injection 125 mg (125 mg Intravenous Given 08/30/17 1651)  HYDROmorphone (DILAUDID) injection 1 mg (1 mg Intravenous Given 08/30/17 1651)  ondansetron (ZOFRAN)  injection 4 mg (4 mg Intravenous Given 08/30/17 1651)     Initial Impression / Assessment and Plan / ED Course  I have reviewed the triage vital signs and the nursing notes.  Pertinent labs & imaging results that were available during my care of the patient were reviewed by me and considered in my medical decision making (see chart for details).   CT scan shows some spinal stenosis.  Urinalysis has some bacteria but no other abnormalities.  We will culture that and see if she has infection.  Patient is given steroids and pain medicine for her back pain with sciatica    Final Clinical Impressions(s) / ED Diagnoses   Final diagnoses:  Acute right-sided low back pain with sciatica, sciatica laterality unspecified    ED Discharge Orders        Ordered    predniSONE (DELTASONE) 10 MG tablet  Daily     08/30/17 1802    oxyCODONE-acetaminophen (PERCOCET/ROXICET) 5-325 MG tablet  Every 6 hours PRN     08/30/17 1802       Milton Ferguson, MD 08/30/17 1806

## 2017-08-30 NOTE — ED Notes (Signed)
Pt transported to CT ?

## 2017-08-30 NOTE — Discharge Instructions (Addendum)
Rest at home 2-3 days and follow-up with your family doctor next week

## 2017-08-30 NOTE — ED Triage Notes (Signed)
Pt c/o pain in r lower back since yesterday.  Reports r leg feels "sore."  Pt has history of sciatica but says it usually hurts on her left side.    Denies injury.  Denies any numbness or tingling in extremities, denies any problems with bowels or bladder.

## 2017-09-02 LAB — URINE CULTURE: Culture: >=100000 — AB

## 2017-09-03 ENCOUNTER — Telehealth: Payer: Self-pay | Admitting: *Deleted

## 2017-09-03 NOTE — Telephone Encounter (Signed)
Post ED Visit - Positive Culture Follow-up  Culture report reviewed by antimicrobial stewardship pharmacist:  []  Elenor Quinones, Pharm.D. []  Heide Guile, Pharm.D., BCPS AQ-ID []  Parks Neptune, Pharm.D., BCPS [x]  Alycia Rossetti, Pharm.D., BCPS []  [place], Pharm.D., BCPS, AAHIVP []  Legrand Como, Pharm.D., BCPS, AAHIVP []  Salome Arnt, PharmD, BCPS []  Jalene Mullet, PharmD []  Vincenza Hews, PharmD, BCPS  Positive  urine culture No specific UTI symptoms noted and no further patient follow-up is required at this time.  Harlon Flor [facility] 09/03/2017, 10:00 AM

## 2017-09-05 DIAGNOSIS — E782 Mixed hyperlipidemia: Secondary | ICD-10-CM | POA: Diagnosis not present

## 2017-09-05 DIAGNOSIS — N399 Disorder of urinary system, unspecified: Secondary | ICD-10-CM | POA: Diagnosis not present

## 2017-09-05 DIAGNOSIS — E669 Obesity, unspecified: Secondary | ICD-10-CM | POA: Diagnosis not present

## 2017-09-05 DIAGNOSIS — N390 Urinary tract infection, site not specified: Secondary | ICD-10-CM | POA: Diagnosis not present

## 2017-09-05 DIAGNOSIS — E1165 Type 2 diabetes mellitus with hyperglycemia: Secondary | ICD-10-CM | POA: Diagnosis not present

## 2017-09-09 ENCOUNTER — Encounter (HOSPITAL_COMMUNITY): Payer: Self-pay | Admitting: *Deleted

## 2017-09-09 ENCOUNTER — Other Ambulatory Visit: Payer: Self-pay

## 2017-09-09 ENCOUNTER — Emergency Department (HOSPITAL_COMMUNITY)
Admission: EM | Admit: 2017-09-09 | Discharge: 2017-09-09 | Disposition: A | Discharge status: Home / Self Care | Payer: Medicare HMO | Attending: Emergency Medicine | Admitting: Emergency Medicine

## 2017-09-09 DIAGNOSIS — M79604 Pain in right leg: Secondary | ICD-10-CM | POA: Diagnosis not present

## 2017-09-09 DIAGNOSIS — Z7982 Long term (current) use of aspirin: Secondary | ICD-10-CM | POA: Insufficient documentation

## 2017-09-09 DIAGNOSIS — I10 Essential (primary) hypertension: Secondary | ICD-10-CM | POA: Insufficient documentation

## 2017-09-09 DIAGNOSIS — E119 Type 2 diabetes mellitus without complications: Secondary | ICD-10-CM | POA: Insufficient documentation

## 2017-09-09 DIAGNOSIS — Z79899 Other long term (current) drug therapy: Secondary | ICD-10-CM | POA: Diagnosis not present

## 2017-09-09 DIAGNOSIS — Z7984 Long term (current) use of oral hypoglycemic drugs: Secondary | ICD-10-CM | POA: Diagnosis not present

## 2017-09-09 DIAGNOSIS — M79661 Pain in right lower leg: Secondary | ICD-10-CM | POA: Insufficient documentation

## 2017-09-09 DIAGNOSIS — R52 Pain, unspecified: Secondary | ICD-10-CM | POA: Diagnosis not present

## 2017-09-09 LAB — CBC WITH DIFFERENTIAL/PLATELET
BASOS PCT: 0 %
Basophils Absolute: 0 10*3/uL (ref 0.0–0.1)
Eosinophils Absolute: 0.1 10*3/uL (ref 0.0–0.7)
Eosinophils Relative: 1 %
HEMATOCRIT: 39.2 % (ref 36.0–46.0)
HEMOGLOBIN: 11.9 g/dL — AB (ref 12.0–15.0)
LYMPHS ABS: 2.1 10*3/uL (ref 0.7–4.0)
Lymphocytes Relative: 27 %
MCH: 24.9 pg — AB (ref 26.0–34.0)
MCHC: 30.4 g/dL (ref 30.0–36.0)
MCV: 82.2 fL (ref 78.0–100.0)
MONO ABS: 0.5 10*3/uL (ref 0.1–1.0)
MONOS PCT: 7 %
NEUTROS ABS: 5.1 10*3/uL (ref 1.7–7.7)
Neutrophils Relative %: 65 %
Platelets: 287 10*3/uL (ref 150–400)
RBC: 4.77 MIL/uL (ref 3.87–5.11)
RDW: 13.8 % (ref 11.5–15.5)
WBC: 7.8 10*3/uL (ref 4.0–10.5)

## 2017-09-09 LAB — COMPREHENSIVE METABOLIC PANEL
ALBUMIN: 4.3 g/dL (ref 3.5–5.0)
ALK PHOS: 89 U/L (ref 38–126)
ALT: 19 U/L (ref 14–54)
ANION GAP: 12 (ref 5–15)
AST: 27 U/L (ref 15–41)
BILIRUBIN TOTAL: 1.3 mg/dL — AB (ref 0.3–1.2)
BUN: 18 mg/dL (ref 6–20)
CALCIUM: 9.7 mg/dL (ref 8.9–10.3)
CO2: 23 mmol/L (ref 22–32)
Chloride: 100 mmol/L — ABNORMAL LOW (ref 101–111)
Creatinine, Ser: 1.2 mg/dL — ABNORMAL HIGH (ref 0.44–1.00)
GFR calc non Af Amer: 45 mL/min — ABNORMAL LOW (ref >60)
GFR, EST AFRICAN AMERICAN: 52 mL/min — AB (ref >60)
GLUCOSE: 244 mg/dL — AB (ref 65–99)
POTASSIUM: 3.5 mmol/L (ref 3.5–5.1)
Sodium: 135 mmol/L (ref 135–145)
TOTAL PROTEIN: 7.9 g/dL (ref 6.5–8.1)

## 2017-09-09 MED ORDER — ENOXAPARIN SODIUM 100 MG/ML ~~LOC~~ SOLN
1.0000 mg/kg | Freq: Once | SUBCUTANEOUS | Status: AC
Start: 2017-09-09 — End: 2017-09-09
  Administered 2017-09-09: 95 mg via SUBCUTANEOUS
  Filled 2017-09-09: qty 1

## 2017-09-09 MED ORDER — SODIUM CHLORIDE 0.9 % IV BOLUS
1000.0000 mL | Freq: Once | INTRAVENOUS | Status: AC
  Administered 2017-09-09: 1000 mL via INTRAVENOUS

## 2017-09-09 NOTE — Discharge Instructions (Addendum)
Your glucose tonight was 244.  You have been given a blood thinning medicine called Lovenox.  Return tomorrow for a Doppler study of your right leg to look for a blood clot.  This information will be given to you by the RN.

## 2017-09-09 NOTE — ED Triage Notes (Signed)
Patient had increased leg pain beginning today around 530pm, trying to get in her car, could not get out, then called EMS.

## 2017-09-09 NOTE — ED Provider Notes (Signed)
[facility] EMERGENCY DEPARTMENT Provider Note   CSN: [id number] Arrival date & time: 09/09/17  1817     History   Chief Complaint Chief Complaint  Patient presents with  . Leg Pain    right    HPI Samantha [name] is a 72 y.o. female.  Patient complains of cramping in her right calf starting approximately 1730 today.  No prolonged immobilization, recent travel, recent surgery.  No trauma to the leg, dyspnea, chest pain..  Severity of symptoms is moderate.  Palpation makes symptoms better or worse.     Past Medical History:  Diagnosis Date  . Diabetes mellitus   . Hyperlipidemia   . Hypertension     Patient Active Problem List   Diagnosis Date Noted  . 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 DERANGEMENT MENISCUS 12/16/2007  . DIABETES MELLITUS, TYPE II, CONTROLLED 12/18/2006  . HYPERLIPIDEMIA 05/07/2006  . OBESITY NOS 05/07/2006  . ANEMIA-NOS 05/07/2006  . CARPAL TUNNEL SYNDROME 05/07/2006  . HYPERTENSION 05/07/2006    Past Surgical History:  Procedure Laterality Date  . TUBAL LIGATION       OB History   None      Home Medications    Prior to Admission medications   Medication Sig Start Date End Date Taking? Authorizing Provider  acetaminophen (TYLENOL) 500 MG tablet Take 1,000 mg by mouth every 6 (six) hours as needed for mild pain.   Yes [provider]  amLODipine (NORVASC) 5 MG tablet Take 5 mg by mouth 2 (two) times daily.   Yes [provider]  aspirin EC 81 MG tablet Take 81 mg by mouth daily.   Yes [provider]  cloNIDine (CATAPRES) 0.1 MG tablet Take 0.1 mg by mouth 2 (two) times daily.   Yes [provider]  Cyanocobalamin ([employer] B-12) 1500 MCG TBDP Take 1 tablet by mouth daily.   Yes [provider]  ezetimibe (ZETIA) 10 MG tablet Take 10 mg by mouth daily. 08/29/17  Yes [provider]  guaiFENesin-codeine 100-10 MG/5ML syrup Take 1 - 2 teaspoonsful by mouth every 4 to 6 hours 08/27/17   Yes [provider]  Homeopathic Products ([employer]S LEG CRAMPS PAIN RELIEF PO) Take 1 tablet by mouth daily as needed (for cramping).    Yes [provider]  linagliptin (TRADJENTA) 5 MG TABS tablet Take 5 mg by mouth daily.    Yes [provider]  lisinopril-hydrochlorothiazide (PRINZIDE,ZESTORETIC) 20-12.5 MG per tablet Take 2 tablets by mouth daily.   Yes [provider]  Misc Natural Products (GLUCOSAMINE CHOND COMPLEX/MSM PO) Take 1 tablet by mouth daily.   Yes [provider]  nitrofurantoin, macrocrystal-monohydrate, (MACROBID) 100 MG capsule Take 100 mg by mouth 2 (two) times daily. 09/05/17  Yes [provider]  oxyCODONE-acetaminophen (PERCOCET/ROXICET) 5-325 MG tablet Take 1 tablet by mouth every 6 (six) hours as needed. 08/30/17  Yes Milton Ferguson, MD  potassium chloride SA (K-DUR,KLOR-CON) 20 MEQ tablet Take 20 mEq by mouth daily.   Yes [provider]  predniSONE (DELTASONE) 10 MG tablet Take 2 tablets (20 mg total) by mouth daily. Patient not taking: Reported on 09/09/2017 08/30/17   Milton Ferguson, MD    Family History History reviewed. No pertinent family history.  Social History Social History   Tobacco Use  . Smoking status: Never Smoker  . Smokeless tobacco: Never Used  Substance Use Topics  . Alcohol use: No  . Drug use: No     Allergies   Patient  has no known allergies.   Review of Systems Review of Systems  All other systems reviewed and are negative.    Physical Exam Updated Vital Signs BP 140/70 (BP Location: Left Arm)   Pulse 77   Temp 97.7 F (36.5 C) (Oral)   Resp 18   Ht 5\' 1"  (1.549 m)   Wt 93 kg (205 lb)   SpO2 100%   BMI 38.73 kg/m   Physical Exam  Constitutional: She is oriented to person, place, and time. She appears well-developed and well-nourished.  HENT:  Head: Normocephalic and atraumatic.  Eyes: Conjunctivae are normal.  Neck: Neck supple.  Cardiovascular: Normal rate  and regular rhythm.  Pulmonary/Chest: Effort normal and breath sounds normal.  Abdominal: Soft. Bowel sounds are normal.  Musculoskeletal:  Tender to palpation right calf.  No obvious swelling.  Neurological: She is alert and oriented to person, place, and time.  Skin: Skin is warm and dry.  Psychiatric: She has a normal mood and affect. Her behavior is normal.  Nursing note and vitals reviewed.    ED Treatments / Results  Labs (all labs ordered are listed, but only abnormal results are displayed) Labs Reviewed  CBC WITH DIFFERENTIAL/PLATELET - Abnormal; Notable for the following components:      Result Value   Hemoglobin 11.9 (*)    MCH 24.9 (*)    All other components within normal limits  COMPREHENSIVE METABOLIC PANEL - Abnormal; Notable for the following components:   Chloride 100 (*)    Glucose, Bld 244 (*)    Creatinine, Ser 1.20 (*)    Total Bilirubin 1.3 (*)    GFR calc non Af Amer 45 (*)    GFR calc Af Amer 52 (*)    All other components within normal limits    EKG None  Radiology No results found.  Procedures Procedures (including critical care time)  Medications Ordered in ED Medications  sodium chloride 0.9 % bolus 1,000 mL (0 mLs Intravenous Stopped 09/09/17 2038)  enoxaparin (LOVENOX) injection 95 mg (95 mg Subcutaneous Given 09/09/17 2125)     Initial Impression / Assessment and Plan / ED Course  I have reviewed the triage vital signs and the nursing notes.  Pertinent labs & imaging results that were available during my care of the patient were reviewed by me and considered in my medical decision making (see chart for details).     Patient presents with cramping and tenderness in her right calf.  Electrolytes do not show any obvious anomalies to explain this.  We will give Lovenox 1 mg/kg subcu.  Return in morning for Doppler study of right leg to rule out DVT.  Final Clinical Impressions(s) / ED Diagnoses   Final diagnoses:  Tenderness of right  calf    ED Discharge Orders        Ordered    US Venous Img Lower Unilateral Right     09/09/17 2120       Nat Christen, MD 09/09/17 2137

## 2017-09-10 ENCOUNTER — Ambulatory Visit (HOSPITAL_COMMUNITY)
Admission: RE | Admit: 2017-09-10 | Discharge: 2017-09-10 | Disposition: A | Discharge status: Home / Self Care | Payer: Medicare HMO | Source: Ambulatory Visit | Attending: Emergency Medicine | Admitting: Emergency Medicine

## 2017-09-10 DIAGNOSIS — M79604 Pain in right leg: Secondary | ICD-10-CM | POA: Insufficient documentation

## 2017-09-10 DIAGNOSIS — M79661 Pain in right lower leg: Secondary | ICD-10-CM | POA: Diagnosis not present

## 2017-09-12 DIAGNOSIS — E119 Type 2 diabetes mellitus without complications: Secondary | ICD-10-CM | POA: Diagnosis not present

## 2017-09-12 DIAGNOSIS — R6520 Severe sepsis without septic shock: Secondary | ICD-10-CM | POA: Diagnosis not present

## 2017-09-12 DIAGNOSIS — I10 Essential (primary) hypertension: Secondary | ICD-10-CM | POA: Diagnosis not present

## 2017-09-14 DIAGNOSIS — M542 Cervicalgia: Secondary | ICD-10-CM | POA: Diagnosis not present

## 2017-09-14 DIAGNOSIS — S32010A Wedge compression fracture of first lumbar vertebra, initial encounter for closed fracture: Secondary | ICD-10-CM | POA: Diagnosis not present

## 2017-09-14 DIAGNOSIS — S299XXA Unspecified injury of thorax, initial encounter: Secondary | ICD-10-CM | POA: Diagnosis not present

## 2017-09-14 DIAGNOSIS — M549 Dorsalgia, unspecified: Secondary | ICD-10-CM | POA: Diagnosis not present

## 2017-09-14 DIAGNOSIS — M545 Low back pain: Secondary | ICD-10-CM | POA: Diagnosis not present

## 2017-10-04 DIAGNOSIS — M62830 Muscle spasm of back: Secondary | ICD-10-CM | POA: Diagnosis not present

## 2017-10-04 DIAGNOSIS — I10 Essential (primary) hypertension: Secondary | ICD-10-CM | POA: Diagnosis not present

## 2017-10-04 DIAGNOSIS — E119 Type 2 diabetes mellitus without complications: Secondary | ICD-10-CM | POA: Diagnosis not present

## 2017-10-04 DIAGNOSIS — S338XXA Sprain of other parts of lumbar spine and pelvis, initial encounter: Secondary | ICD-10-CM | POA: Diagnosis not present

## 2017-10-12 DIAGNOSIS — M542 Cervicalgia: Secondary | ICD-10-CM | POA: Diagnosis not present

## 2017-10-18 DIAGNOSIS — I10 Essential (primary) hypertension: Secondary | ICD-10-CM | POA: Diagnosis not present

## 2017-10-18 DIAGNOSIS — E1165 Type 2 diabetes mellitus with hyperglycemia: Secondary | ICD-10-CM | POA: Diagnosis not present

## 2017-10-24 ENCOUNTER — Encounter (HOSPITAL_COMMUNITY): Payer: Self-pay

## 2017-10-24 ENCOUNTER — Ambulatory Visit (HOSPITAL_COMMUNITY): Payer: Medicare HMO | Attending: Orthopedic Surgery

## 2017-10-24 DIAGNOSIS — R29898 Other symptoms and signs involving the musculoskeletal system: Secondary | ICD-10-CM | POA: Insufficient documentation

## 2017-10-24 DIAGNOSIS — R293 Abnormal posture: Secondary | ICD-10-CM | POA: Insufficient documentation

## 2017-10-24 DIAGNOSIS — M542 Cervicalgia: Secondary | ICD-10-CM | POA: Diagnosis not present
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
## 2017-10-26 ENCOUNTER — Ambulatory Visit (HOSPITAL_COMMUNITY): Payer: Medicare HMO

## 2017-10-26 ENCOUNTER — Encounter (HOSPITAL_COMMUNITY): Payer: Self-pay

## 2017-10-26 DIAGNOSIS — R29898 Other symptoms and signs involving the musculoskeletal system: Secondary | ICD-10-CM | POA: Diagnosis not present

## 2017-10-26 DIAGNOSIS — R293 Abnormal posture: Secondary | ICD-10-CM

## 2017-10-26 DIAGNOSIS — M542 Cervicalgia: Secondary | ICD-10-CM | POA: Diagnosis not present

## 2017-10-26 NOTE — Therapy (Signed)
[place], [place], [address] Phone: [phone]   Fax:  [phone]  Physical Therapy Treatment  Patient Details  Name: Samantha [name] MRN: [id number] Date of Birth: [birth year] Referring Provider: Edmonia Lynch, MD   Encounter Date: 10/26/2017  PT End of Session - 10/26/17 1309    Visit Number  2    Number of Visits  9    Date for PT Re-Evaluation  11/21/17    Authorization Type  [employer] H[employer]    Authorization Time Period  10/24/17 to 11/21/17    PT Start Time  1308 pt arrived late    PT Stop Time  1348    PT Time Calculation (min)  40 min    Activity Tolerance  Patient tolerated treatment well    Behavior During Therapy  [facility] for tasks assessed/performed       Past Medical History:  Diagnosis Date  . Diabetes mellitus   . Hyperlipidemia   . Hypertension     Past Surgical History:  Procedure Laterality Date  . TUBAL LIGATION      There were no vitals filed for this visit.  Subjective Assessment - 10/26/17 1310    Subjective  Pt states that her neck was killing her following her eval. It has since calmed down and she is not in any pain.     Limitations  Sitting    How long can you sit comfortably?  couple of hours    How long can you stand comfortably?  limited due to back pain. not neck    How long can you walk comfortably?  limited due to back pain, not neck    Patient Stated Goals  relieve the pain    Currently in Pain?  No/denies    Pain Onset  More than a month ago            [facility] PT Assessment - 10/26/17 0001      other    Findings  Negative    Comment  median and ulnar nerve glides: neg bil, did not recreate or increased hand numbness      other    Findings  Negative    Comment  Radial nerve glides: neg bil, did not recreate or increased hand numbness          [facility] Adult PT Treatment/Exercise - 10/26/17 0001      Exercises   Exercises  Neck      Neck Exercises:  Seated   Other Seated Exercise  cervical excursions x5 reps each      Neck Exercises: Supine   Neck Retraction  10 reps;3 secs      Manual Therapy   Manual Therapy  Soft tissue mobilization    Manual therapy comments  completed separate rest of treatment    Soft tissue mobilization  STM to bil upper trap, levator scap, and upper thoracic paraspinals, R>L, for pain control and decreased soft tissue restrictions      Neck Exercises: Stretches   Upper Trapezius Stretch  Right;Left;2 reps;30 seconds    Levator Stretch  Right;Left;2 reps;30 seconds            PT Short Term Goals - 10/24/17 1626      PT SHORT TERM GOAL #1   Title  Pt will be independent with HEP and perform consistently in order to decrease pain and improve ROM.    Time  2    Period  Weeks  Status  New    Target Date  11/07/17      PT SHORT TERM GOAL #2   Title  Pt will have improved cervical AROM by 5 deg throughout in order to decrease pain and maximize her ability to drive.    Time  2    Period  Weeks    Status  New        PT Long Term Goals - 10/24/17 1627      PT LONG TERM GOAL #1   Title  Pt will have improved cervical AROM by 10 deg or > for cervical flexion, extension, and bil rotation in order to further decrease pain and maximize ability to complete tasks at home with greater ease.    Time  4    Period  Weeks    Status  New    Target Date  11/21/17      PT LONG TERM GOAL #2   Title  Pt will be able to perform deep neck flexor endurance test for 30 sec or > with proper form and without pain in order to demo improved cervical strength and decrease pain.    Time  4    Period  Weeks    Status  New      PT LONG TERM GOAL #3   Title  Pt will report being able to sit for 1 hour or > with proper posture and without increases in neck pain in order to maximize function at work.    Time  4    Period  Weeks    Status  New      PT LONG TERM GOAL #4   Title  Pt will have min to no soft tissue  restrictions of upper trap and levaotr scap and report min to no pain with palpation in order to demo improved soft tissue length and decrease her overall pain.     Time  4    Period  Weeks    Status  New            Plan - 10/26/17 1403    Clinical Impression Statement  Began session by reviewing initial goals and issuing copy of eval; no f/u questions afterwards. Pt's nerve glides negative for numbness/tingling recreation in bil hands. Educated pt on sitting with lumbar roll for proper posture when at work in order to decrease pain. Initiated upper trap and levator scap stretching bilaterally. Pt requiring cues for proper technique for cervical retractions. Ended with manual for STM of bil upper trap, levator scap, and upper thoracic paraspinals. Pt not reporting any pain at EOS and stated she felt slightly looser following. Educated her that she could be sore following the manual and she verbalized understanding.    Rehab Potential  Fair    PT Frequency  2x / week    PT Duration  4 weeks    PT Treatment/Interventions  ADLs/Self Care Home Management;Cryotherapy;Electrical Stimulation;Moist Heat;Traction;Ultrasound;Functional mobility training;Therapeutic activities;Therapeutic exercise;Balance training;Neuromuscular re-education;Patient/family education;Manual techniques;Scar mobilization;Passive range of motion;Dry needling;Energy conservation;Taping    PT Next Visit Plan  reveiw lumbar roll; continue UT and levator scap stretching, postural strengthening, review lumbar roll for sitting posture, thoracic and cervical mobility, spinal mobs if wtih PT, STM for soft tissue restricitons (consider dry needling if positive resopnse with manual STM)    PT Home Exercise Plan  eval: supine cervical retractions    Consulted and Agree with Plan of Care  Patient       Patient will  benefit from skilled therapeutic intervention in order to improve the following deficits and impairments:  Decreased  endurance, Decreased mobility, Decreased range of motion, Decreased strength, Hypomobility, Increased fascial restricitons, Increased muscle spasms, Impaired flexibility, Impaired sensation, Impaired UE functional use, Improper body mechanics, Postural dysfunction, Pain  Visit Diagnosis: Cervicalgia  Abnormal posture  Other symptoms and signs involving the musculoskeletal system     Problem List Patient Active Problem List   Diagnosis Date Noted  . 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 DERANGEMENT MENISCUS 12/16/2007  . DIABETES MELLITUS, TYPE II, CONTROLLED 12/18/2006  . HYPERLIPIDEMIA 05/07/2006  . OBESITY NOS 05/07/2006  . ANEMIA-NOS 05/07/2006  . CARPAL TUNNEL SYNDROME 05/07/2006  . HYPERTENSION 05/07/2006       Geraldine Solar PT, DPT  [facility] [address] [place], [place], [address] Phone: [phone]   Fax:  [phone]  Name: ARANDA BIHM MRN: [id number] Date of Birth: 1945/10/23

## 2017-10-30 ENCOUNTER — Ambulatory Visit (HOSPITAL_COMMUNITY): Payer: Medicare HMO

## 2017-10-30 ENCOUNTER — Telehealth (HOSPITAL_COMMUNITY): Payer: Self-pay

## 2017-10-30 ENCOUNTER — Encounter (HOSPITAL_COMMUNITY): Payer: Self-pay

## 2017-10-30 DIAGNOSIS — M542 Cervicalgia: Secondary | ICD-10-CM | POA: Diagnosis not present

## 2017-10-30 DIAGNOSIS — R293 Abnormal posture: Secondary | ICD-10-CM

## 2017-10-30 DIAGNOSIS — R29898 Other symptoms and signs involving the musculoskeletal system: Secondary | ICD-10-CM | POA: Diagnosis not present

## 2017-10-30 NOTE — Therapy (Signed)
[place], [place], [address] Phone: [phone]   Fax:  [phone]  Physical Therapy Treatment  Patient Details  Name: Samantha [name] MRN: [id number] Date of Birth: 10/11/1945 Referring Provider: Edmonia Lynch, MD   Encounter Date: 10/30/2017  PT End of Session - 10/30/17 1458    Visit Number  3    Number of Visits  9    Date for PT Re-Evaluation  11/21/17    Authorization Type  [employer] H[employer]    Authorization Time Period  10/24/17 to 11/21/17    PT Start Time  1450 pt late for apt    PT Stop Time  1513    PT Time Calculation (min)  23 min    Activity Tolerance  Patient tolerated treatment well    Behavior During Therapy  [facility] for tasks assessed/performed       Past Medical History:  Diagnosis Date  . Diabetes mellitus   . Hyperlipidemia   . Hypertension     Past Surgical History:  Procedure Laterality Date  . TUBAL LIGATION      There were no vitals filed for this visit.  Subjective Assessment - 10/30/17 1454    Subjective  Pt arrived late for apt, stated she was unable to get a ride.  Stated she has not real pain more tension in neck.  Reports noncompliance with HEP without reason given.      Patient Stated Goals  relieve the pain    Currently in Pain?  No/denies tension no pain                       [facility] Adult PT Treatment/Exercise - 10/30/17 0001      Exercises   Exercises  Neck      Neck Exercises: Seated   Neck Retraction  10 reps;5 secs cueing for mechanics    Other Seated Exercise  cervical excursions x5 reps each; thoracic excursion 5x each    Other Seated Exercise  lumbar roll      Neck Exercises: Stretches   Upper Trapezius Stretch  Right;Left;2 reps;30 seconds    Levator Stretch  Right;Left;2 reps;30 seconds               PT Short Term Goals - 10/24/17 1626      PT SHORT TERM GOAL #1   Title  Pt will be independent with HEP and perform  consistently in order to decrease pain and improve ROM.    Time  2    Period  Weeks    Status  New    Target Date  11/07/17      PT SHORT TERM GOAL #2   Title  Pt will have improved cervical AROM by 5 deg throughout in order to decrease pain and maximize her ability to drive.    Time  2    Period  Weeks    Status  New        PT Long Term Goals - 10/24/17 1627      PT LONG TERM GOAL #1   Title  Pt will have improved cervical AROM by 10 deg or > for cervical flexion, extension, and bil rotation in order to further decrease pain and maximize ability to complete tasks at home with greater ease.    Time  4    Period  Weeks    Status  New    Target Date  11/21/17  PT LONG TERM GOAL #2   Title  Pt will be able to perform deep neck flexor endurance test for 30 sec or > with proper form and without pain in order to demo improved cervical strength and decrease pain.    Time  4    Period  Weeks    Status  New      PT LONG TERM GOAL #3   Title  Pt will report being able to sit for 1 hour or > with proper posture and without increases in neck pain in order to maximize function at work.    Time  4    Period  Weeks    Status  New      PT LONG TERM GOAL #4   Title  Pt will have min to no soft tissue restrictions of upper trap and levaotr scap and report min to no pain with palpation in order to demo improved soft tissue length and decrease her overall pain.     Time  4    Period  Weeks    Status  New            Plan - 10/30/17 1500    Clinical Impression Statement  Pt late for apt today, unable to complete whole PT POC.  Session focus on cervical mobility and postural education.  Reviewed benefits of lumbar roll to assist with seated posture and pain control, pt stated she needs to add one to vehicle.  Min cueing to improve cervical retraction and reduce compensation with shoulder muscualture.  No reports of pain through session.    Rehab Potential  Fair    PT Frequency  2x /  week    PT Duration  4 weeks    PT Treatment/Interventions  ADLs/Self Care Home Management;Cryotherapy;Electrical Stimulation;Moist Heat;Traction;Ultrasound;Functional mobility training;Therapeutic activities;Therapeutic exercise;Balance training;Neuromuscular re-education;Patient/family education;Manual techniques;Scar mobilization;Passive range of motion;Dry needling;Energy conservation;Taping    PT Next Visit Plan  reveiw lumbar roll; continue UT and levator scap stretching, postural strengthening, review lumbar roll for sitting posture, thoracic and cervical mobility, spinal mobs if wtih PT, STM for soft tissue restricitons (consider dry needling if positive resopnse with manual STM)    PT Home Exercise Plan  eval: supine cervical retractions       Patient will benefit from skilled therapeutic intervention in order to improve the following deficits and impairments:  Decreased endurance, Decreased mobility, Decreased range of motion, Decreased strength, Hypomobility, Increased fascial restricitons, Increased muscle spasms, Impaired flexibility, Impaired sensation, Impaired UE functional use, Improper body mechanics, Postural dysfunction, Pain  Visit Diagnosis: Cervicalgia  Abnormal posture  Other symptoms and signs involving the musculoskeletal system     Problem List Patient Active Problem List   Diagnosis Date Noted  . 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 DERANGEMENT MENISCUS 12/16/2007  . DIABETES MELLITUS, TYPE II, CONTROLLED 12/18/2006  . HYPERLIPIDEMIA 05/07/2006  . OBESITY NOS 05/07/2006  . ANEMIA-NOS 05/07/2006  . CARPAL TUNNEL SYNDROME 05/07/2006  . HYPERTENSION 05/07/2006   [place], [place]; [place]  Aldona Lento 10/30/2017, 3:28 PM  [facility] [address] [place], [place], [address] Phone: [phone]   Fax:  [phone]  Name: Samantha [name] MRN: [id number] Date of Birth: [birth year]

## 2017-10-30 NOTE — Telephone Encounter (Signed)
No show, called pt and attempted to leave message but mail box is full.    [address], [place]; CBIS [phone]'

## 2017-11-01 ENCOUNTER — Ambulatory Visit (HOSPITAL_COMMUNITY): Payer: Medicare HMO | Admitting: Physical Therapy

## 2017-11-01 ENCOUNTER — Telehealth (HOSPITAL_COMMUNITY): Payer: Self-pay | Admitting: Physical Therapy

## 2017-11-01 NOTE — Telephone Encounter (Signed)
Pt did not show for appt.  Contacted patient who reports [birth year] is her birthday and she forgot to call and cancel. Teena Irani, PTA/CLT [phone]

## 2017-11-07 ENCOUNTER — Ambulatory Visit (HOSPITAL_COMMUNITY): Payer: Medicare HMO

## 2017-11-07 DIAGNOSIS — M542 Cervicalgia: Secondary | ICD-10-CM | POA: Diagnosis not present

## 2017-11-07 DIAGNOSIS — R29898 Other symptoms and signs involving the musculoskeletal system: Secondary | ICD-10-CM | POA: Diagnosis not present

## 2017-11-07 DIAGNOSIS — R293 Abnormal posture: Secondary | ICD-10-CM

## 2017-11-07 NOTE — Patient Instructions (Signed)
Access Code: [facility]  URL: [email or web address]  Date: 11/07/2017  Prepared by: Geraldine Solar   Exercises Seated Upper Trapezius Stretch - 3-5 reps - 30-60 hold - 1x daily - 7x weekly Gentle Levator Scapulae Stretch - 3-5 reps - 30-60 hold - 1x daily - 7x weekly

## 2017-11-07 NOTE — Therapy (Signed)
[place], [place], [address] Phone: [phone]   Fax:  [phone]  Physical Therapy Treatment  Patient Details  Name: Samantha [name] MRN: [id number] Date of Birth: 04-18-1946 Referring Provider: Edmonia Lynch, MD   Encounter Date: 11/07/2017  PT End of Session - 11/07/17 1437    Visit Number  4    Number of Visits  9    Date for PT Re-Evaluation  11/21/17    Authorization Type  [employer] H[employer]    Authorization Time Period  10/24/17 to 11/21/17    PT Start Time  1436 pt arrived a few mins late    PT Stop Time  1516    PT Time Calculation (min)  40 min    Activity Tolerance  Patient tolerated treatment well    Behavior During Therapy  [facility] for tasks assessed/performed       Past Medical History:  Diagnosis Date  . Diabetes mellitus   . Hyperlipidemia   . Hypertension     Past Surgical History:  Procedure Laterality Date  . TUBAL LIGATION      There were no vitals filed for this visit.  Subjective Assessment - 11/07/17 1437    Subjective  Pt states that she had a good weekend. Her neck pain isn't too bad right now.     Patient Stated Goals  relieve the pain    Currently in Pain?  No/denies             [facility] Adult PT Treatment/Exercise - 11/07/17 0001      Neck Exercises: Machines for Strengthening   UBE (Upper Arm Bike)  x3 mins retro, L1, postural strengthening      Neck Exercises: Theraband   Scapula Retraction  10 reps;Red;Limitations    Scapula Retraction Limitations  2 sets    Shoulder Extension  10 reps;Red;Limitations    Shoulder Extension Limitations  2 sets    Rows  10 reps;Red;Limitations    Rows Limitations  2 sets (high rows)      Neck Exercises: Seated   Neck Retraction  10 reps;5 secs;Limitations    Neck Retraction Limitations  min cues for form    X to V  10 reps    Other Seated Exercise  horiz abd with RTB +cervical retraction x10 reps      Manual Therapy   Manual  Therapy  Joint mobilization;Soft tissue mobilization    Manual therapy comments  completed separate rest of treatment    Joint Mobilization  Grade II-III central PAs C2-T3 for pain control and mobility    Soft tissue mobilization  STM to bil upper trap and levator scap for pain control and mobility      Neck Exercises: Stretches   Upper Trapezius Stretch  Right;Left;2 reps;30 seconds    Levator Stretch  Right;Left;2 reps;30 seconds             PT Education - 11/07/17 1437    Education provided  Yes    Education Details  exercise technique, updated HEP    Person(s) Educated  Patient    Methods  Explanation;Demonstration    Comprehension  Returned demonstration;Verbalized understanding;Need further instruction       PT Short Term Goals - 10/24/17 1626      PT SHORT TERM GOAL #1   Title  Pt will be independent with HEP and perform consistently in order to decrease pain and improve ROM.    Time  2  Period  Weeks    Status  New    Target Date  11/07/17      PT SHORT TERM GOAL #2   Title  Pt will have improved cervical AROM by 5 deg throughout in order to decrease pain and maximize her ability to drive.    Time  2    Period  Weeks    Status  New        PT Long Term Goals - 10/24/17 1627      PT LONG TERM GOAL #1   Title  Pt will have improved cervical AROM by 10 deg or > for cervical flexion, extension, and bil rotation in order to further decrease pain and maximize ability to complete tasks at home with greater ease.    Time  4    Period  Weeks    Status  New    Target Date  11/21/17      PT LONG TERM GOAL #2   Title  Pt will be able to perform deep neck flexor endurance test for 30 sec or > with proper form and without pain in order to demo improved cervical strength and decrease pain.    Time  4    Period  Weeks    Status  New      PT LONG TERM GOAL #3   Title  Pt will report being able to sit for 1 hour or > with proper posture and without increases in neck  pain in order to maximize function at work.    Time  4    Period  Weeks    Status  New      PT LONG TERM GOAL #4   Title  Pt will have min to no soft tissue restrictions of upper trap and levaotr scap and report min to no pain with palpation in order to demo improved soft tissue length and decrease her overall pain.     Time  4    Period  Weeks    Status  New            Plan - 11/07/17 1518    Clinical Impression Statement  Continued with established POC focusing on decreasing soft tissue restrictions, pain and improving overall postural strength. Pt reporting compliance with lumbar roll at work and stating she feels like it is helping. Pt continues to require cues for proper exercise technique. Pt had 1 minor bout of dizziness during postural strengthening so cued pt to perform proper diaphragmatic breathing and pt reported improvements. No reports of pain during therex. Ended with manual STM to address soft tissue restrictions; pt continues with palpable taut bands in upper trap and levator scap but stated that it was not as tender to palpation this date, indicating improvements. Added upper trap and levator scap stretches to HEP. Continue as planned, progressing as able.     Rehab Potential  Fair    PT Frequency  2x / week    PT Duration  4 weeks    PT Treatment/Interventions  ADLs/Self Care Home Management;Cryotherapy;Electrical Stimulation;Moist Heat;Traction;Ultrasound;Functional mobility training;Therapeutic activities;Therapeutic exercise;Balance training;Neuromuscular re-education;Patient/family education;Manual techniques;Scar mobilization;Passive range of motion;Dry needling;Energy conservation;Taping    PT Next Visit Plan  add scalene stretching; continue postural strengthening, add 3D thoracic excursions and mobility work; spinal joint mobs if wtih PT, STM for soft tissue restricitons (consider dry needling if positive resopnse with manual STM)    PT Home Exercise Plan  eval:  supine cervical retractions; 5/29: upper trap  and levator scap stretching    Consulted and Agree with Plan of Care  Patient       Patient will benefit from skilled therapeutic intervention in order to improve the following deficits and impairments:  Decreased endurance, Decreased mobility, Decreased range of motion, Decreased strength, Hypomobility, Increased fascial restricitons, Increased muscle spasms, Impaired flexibility, Impaired sensation, Impaired UE functional use, Improper body mechanics, Postural dysfunction, Pain  Visit Diagnosis: Cervicalgia  Abnormal posture  Other symptoms and signs involving the musculoskeletal system     Problem List Patient Active Problem List   Diagnosis Date Noted  . 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 DERANGEMENT MENISCUS 12/16/2007  . DIABETES MELLITUS, TYPE II, CONTROLLED 12/18/2006  . HYPERLIPIDEMIA 05/07/2006  . OBESITY NOS 05/07/2006  . ANEMIA-NOS 05/07/2006  . CARPAL TUNNEL SYNDROME 05/07/2006  . HYPERTENSION 05/07/2006       Geraldine Solar PT, DPT  [place] [address] [place], [place], [address] Phone: [phone]   Fax:  [phone]  Name: STACEE EARP MRN: [id number] Date of Birth: 01-18-1946

## 2017-11-09 ENCOUNTER — Encounter (HOSPITAL_COMMUNITY): Payer: Self-pay

## 2017-11-09 ENCOUNTER — Ambulatory Visit (HOSPITAL_COMMUNITY): Payer: Medicare HMO

## 2017-11-09 DIAGNOSIS — R293 Abnormal posture: Secondary | ICD-10-CM | POA: Diagnosis not present

## 2017-11-09 DIAGNOSIS — R29898 Other symptoms and signs involving the musculoskeletal system: Secondary | ICD-10-CM | POA: Diagnosis not present

## 2017-11-09 DIAGNOSIS — M542 Cervicalgia: Secondary | ICD-10-CM

## 2017-11-09 DIAGNOSIS — M25551 Pain in right hip: Secondary | ICD-10-CM | POA: Diagnosis not present

## 2017-11-09 NOTE — Therapy (Signed)
[place], [place], [address] Phone: [phone]   Fax:  [phone]  Physical Therapy Treatment  Patient Details  Name: Samantha [name] MRN: [id number] Date of Birth: 06-Dec-1945 Referring Provider: Edmonia Lynch, MD   Encounter Date: 11/09/2017  PT End of Session - 11/09/17 1403    Visit Number  5    Number of Visits  9    Date for PT Re-Evaluation  11/21/17    Authorization Type  [employer] H[employer]    Authorization Time Period  10/24/17 to 11/21/17    PT Start Time  1400 pt late for apt    PT Stop Time  1429    PT Time Calculation (min)  29 min    Activity Tolerance  Patient tolerated treatment well    Behavior During Therapy  [facility] for tasks assessed/performed       Past Medical History:  Diagnosis Date  . Diabetes mellitus   . Hyperlipidemia   . Hypertension     Past Surgical History:  Procedure Laterality Date  . TUBAL LIGATION      There were no vitals filed for this visit.  Subjective Assessment - 11/09/17 1400    Subjective  Pt stated she had apt with MD, no reports of neck pain today.  Did report will receive referral to begin therapy with LE.      Patient Stated Goals  relieve the pain    Currently in Pain?  No/denies                       [facility] Adult PT Treatment/Exercise - 11/09/17 0001      Neck Exercises: Theraband   Scapula Retraction  10 reps;Red;Limitations cueing to relax elevation with exercise    Scapula Retraction Limitations  2 sets    Shoulder Extension  10 reps;Red;Limitations    Shoulder Extension Limitations  2 sets    Rows  10 reps;Red;Limitations    Rows Limitations  2 sets (high rows)      Neck Exercises: Seated   Neck Retraction  15 reps;5 secs    Neck Retraction Limitations  improved mechanics, min cueing     X to V  10 reps    W Back  10 reps    Other Seated Exercise  3D thoracic excusion 5x each     Other Seated Exercise  horiz abd with RTB  +cervical retraction x10 reps      Neck Exercises: Stretches   Upper Trapezius Stretch  Right;Left;2 reps;30 seconds    Levator Stretch  Right;Left;2 reps;30 seconds    Other Neck Stretches  Scalene st with towel 2x 30" Bil               PT Short Term Goals - 10/24/17 1626      PT SHORT TERM GOAL #1   Title  Pt will be independent with HEP and perform consistently in order to decrease pain and improve ROM.    Time  2    Period  Weeks    Status  New    Target Date  11/07/17      PT SHORT TERM GOAL #2   Title  Pt will have improved cervical AROM by 5 deg throughout in order to decrease pain and maximize her ability to drive.    Time  2    Period  Weeks    Status  New  PT Long Term Goals - 10/24/17 1627      PT LONG TERM GOAL #1   Title  Pt will have improved cervical AROM by 10 deg or > for cervical flexion, extension, and bil rotation in order to further decrease pain and maximize ability to complete tasks at home with greater ease.    Time  4    Period  Weeks    Status  New    Target Date  11/21/17      PT LONG TERM GOAL #2   Title  Pt will be able to perform deep neck flexor endurance test for 30 sec or > with proper form and without pain in order to demo improved cervical strength and decrease pain.    Time  4    Period  Weeks    Status  New      PT LONG TERM GOAL #3   Title  Pt will report being able to sit for 1 hour or > with proper posture and without increases in neck pain in order to maximize function at work.    Time  4    Period  Weeks    Status  New      PT LONG TERM GOAL #4   Title  Pt will have min to no soft tissue restrictions of upper trap and levaotr scap and report min to no pain with palpation in order to demo improved soft tissue length and decrease her overall pain.     Time  4    Period  Weeks    Status  New            Plan - 11/09/17 1412    Clinical Impression Statement  Pt ~15 minutes late for apt, unable to complete  full POC.  Continued with established POC focusing on postural strengthening and pain control.  Pt pain free cervical area (did c/o Rt hip pain with weight bearing) this session so focused on stretches for mobility and postural strengthening   Added scalenes stretches and 3D thoracic mobility for muscle lengthening and spinal mobility.  No reports of pain through session.  Reviewed compliance with HEP and encouraged pt to increase frequency for maximal benefits from therapy.      Rehab Potential  Fair    PT Frequency  2x / week    PT Duration  4 weeks    PT Treatment/Interventions  ADLs/Self Care Home Management;Cryotherapy;Electrical Stimulation;Moist Heat;Traction;Ultrasound;Functional mobility training;Therapeutic activities;Therapeutic exercise;Balance training;Neuromuscular re-education;Patient/family education;Manual techniques;Scar mobilization;Passive range of motion;Dry needling;Energy conservation;Taping    PT Next Visit Plan  Continue postural strengthening, add 3D thoracic excursions and mobility work; spinal joint mobs if wtih PT, STM for soft tissue restricitons (consider dry needling if positive resopnse with manual STM)    PT Home Exercise Plan  eval: supine cervical retractions; 5/29: upper trap and levator scap stretching       Patient will benefit from skilled therapeutic intervention in order to improve the following deficits and impairments:  Decreased endurance, Decreased mobility, Decreased range of motion, Decreased strength, Hypomobility, Increased fascial restricitons, Increased muscle spasms, Impaired flexibility, Impaired sensation, Impaired UE functional use, Improper body mechanics, Postural dysfunction, Pain  Visit Diagnosis: Cervicalgia  Abnormal posture  Other symptoms and signs involving the musculoskeletal system     Problem List Patient Active Problem List   Diagnosis Date Noted  . 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 DERANGEMENT MENISCUS  12/16/2007  . DIABETES MELLITUS, TYPE II, CONTROLLED 12/18/2006  . HYPERLIPIDEMIA  05/07/2006  . OBESITY NOS 05/07/2006  . ANEMIA-NOS 05/07/2006  . CARPAL TUNNEL SYNDROME 05/07/2006  . HYPERTENSION 05/07/2006   Ihor Austin, Force; Grant  Aldona Lento 11/09/2017, 3:18 PM  [place] [address] [place], [place], [address] Phone: [phone]   Fax:  [phone]  Name: Samantha [name] MRN: [id number] Date of Birth: 12/12/1945

## 2017-11-13 ENCOUNTER — Ambulatory Visit (HOSPITAL_COMMUNITY): Payer: Medicare HMO | Attending: Orthopedic Surgery

## 2017-11-13 ENCOUNTER — Encounter (HOSPITAL_COMMUNITY): Payer: Self-pay

## 2017-11-13 DIAGNOSIS — R29898 Other symptoms and signs involving the musculoskeletal system: Secondary | ICD-10-CM | POA: Diagnosis not present

## 2017-11-13 DIAGNOSIS — M542 Cervicalgia: Secondary | ICD-10-CM | POA: Diagnosis not present

## 2017-11-13 DIAGNOSIS — R293 Abnormal posture: Secondary | ICD-10-CM | POA: Diagnosis not present

## 2017-11-13 NOTE — Therapy (Signed)
[place], [place], [address] Phone: [phone]   Fax:  [phone]  Physical Therapy Treatment  Patient Details  Name: Samantha [name] MRN: [id number] Date of Birth: [birth year] Referring Provider: Edmonia Lynch, MD   Encounter Date: 11/13/2017  PT End of Session - 11/13/17 1442    Visit Number  6    Number of Visits  9    Date for PT Re-Evaluation  11/21/17    Authorization Type  [employer] H[employer]    Authorization Time Period  10/24/17 to 11/21/17    PT Start Time  1441 pt arrived late    PT Stop Time  1515    PT Time Calculation (min)  34 min    Activity Tolerance  Patient tolerated treatment well    Behavior During Therapy  [facility] for tasks assessed/performed       Past Medical History:  Diagnosis Date  . Diabetes mellitus   . Hyperlipidemia   . Hypertension     Past Surgical History:  Procedure Laterality Date  . TUBAL LIGATION      There were no vitals filed for this visit.  Subjective Assessment - 11/13/17 1444    Subjective  Pt denies any neck pain this date. She had a good weekend.    Patient Stated Goals  relieve the pain    Currently in Pain?  No/denies           [facility] Adult PT Treatment/Exercise - 11/13/17 0001      Neck Exercises: Machines for Strengthening   UBE (Upper Arm Bike)  x3 mins retro, L2, postural strengthening      Neck Exercises: Theraband   Scapula Retraction  10 reps;Green    Scapula Retraction Limitations  2 sets    Shoulder Extension  10 reps;Green    Shoulder Extension Limitations  2 sets    Rows  10 reps;Green    Rows Limitations  2 sets (high rows)    Other Theraband Exercises  D2 flexion PNF RTB 2x10reps      Neck Exercises: Seated   Neck Retraction  15 reps;5 secs    Neck Retraction Limitations  improved mechanics, min cueing     X to V  20 reps    W Back  20 reps    Shoulder Rolls  Backwards;10 reps    Shoulder Rolls Limitations  3" holds, emphasizing  back and down    Other Seated Exercise  3D thoracic excusion with UE x5 each       Neck Exercises: Stretches   Corner Stretch  2 reps;30 seconds    Other Neck Stretches  Scalene st with towel 2x 30" Bil            PT Education - 11/13/17 1444    Education provided  Yes    Education Details  exercise technqiue, continue HEP    Person(s) Educated  Patient    Methods  Explanation;Demonstration    Comprehension  Verbalized understanding;Returned demonstration       PT Short Term Goals - 10/24/17 1626      PT SHORT TERM GOAL #1   Title  Pt will be independent with HEP and perform consistently in order to decrease pain and improve ROM.    Time  2    Period  Weeks    Status  New    Target Date  11/07/17      PT SHORT TERM GOAL #2  Title  Pt will have improved cervical AROM by 5 deg throughout in order to decrease pain and maximize her ability to drive.    Time  2    Period  Weeks    Status  New        PT Long Term Goals - 10/24/17 1627      PT LONG TERM GOAL #1   Title  Pt will have improved cervical AROM by 10 deg or > for cervical flexion, extension, and bil rotation in order to further decrease pain and maximize ability to complete tasks at home with greater ease.    Time  4    Period  Weeks    Status  New    Target Date  11/21/17      PT LONG TERM GOAL #2   Title  Pt will be able to perform deep neck flexor endurance test for 30 sec or > with proper form and without pain in order to demo improved cervical strength and decrease pain.    Time  4    Period  Weeks    Status  New      PT LONG TERM GOAL #3   Title  Pt will report being able to sit for 1 hour or > with proper posture and without increases in neck pain in order to maximize function at work.    Time  4    Period  Weeks    Status  New      PT LONG TERM GOAL #4   Title  Pt will have min to no soft tissue restrictions of upper trap and levaotr scap and report min to no pain with palpation in order to  demo improved soft tissue length and decrease her overall pain.     Time  4    Period  Weeks    Status  New            Plan - 11/13/17 1518    Clinical Impression Statement  Session limited as pt arrived late for appointment. Focused on thoracic, cervical mobility as well as postural strengthening. Pt reporting some R shoulder pain after UBE and certain exercises so limited to within pt's tolerance. Pt's cervical retractions continuing to improve. Able to perform all postural strengthening while maintaining retraction. She continues to have increased upper trap involvement during postural strengthening which requires cues to correct. No pain reported at EOS, just fatigue.     Rehab Potential  Fair    PT Frequency  2x / week    PT Duration  4 weeks    PT Treatment/Interventions  ADLs/Self Care Home Management;Cryotherapy;Electrical Stimulation;Moist Heat;Traction;Ultrasound;Functional mobility training;Therapeutic activities;Therapeutic exercise;Balance training;Neuromuscular re-education;Patient/family education;Manual techniques;Scar mobilization;Passive range of motion;Dry needling;Energy conservation;Taping    PT Next Visit Plan  Continue postural strengthening, add 3D thoracic excursions and mobility work; progress postural strengthening as tolerated; spinal joint mobs if wtih PT, STM for soft tissue restricitons (consider dry needling if positive resopnse with manual STM)    PT Home Exercise Plan  eval: supine cervical retractions; 5/29: upper trap and levator scap stretching    Consulted and Agree with Plan of Care  Patient       Patient will benefit from skilled therapeutic intervention in order to improve the following deficits and impairments:  Decreased endurance, Decreased mobility, Decreased range of motion, Decreased strength, Hypomobility, Increased fascial restricitons, Increased muscle spasms, Impaired flexibility, Impaired sensation, Impaired UE functional use, Improper body  mechanics, Postural dysfunction, Pain  Visit  Diagnosis: Cervicalgia  Abnormal posture  Other symptoms and signs involving the musculoskeletal system     Problem List Patient Active Problem List   Diagnosis Date Noted  . 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 DERANGEMENT MENISCUS 12/16/2007  . DIABETES MELLITUS, TYPE II, CONTROLLED 12/18/2006  . HYPERLIPIDEMIA 05/07/2006  . OBESITY NOS 05/07/2006  . ANEMIA-NOS 05/07/2006  . CARPAL TUNNEL SYNDROME 05/07/2006  . HYPERTENSION 05/07/2006       Geraldine Solar PT, DPT  [place] [address] [place], [place], [address] Phone: [phone]   Fax:  [phone]  Name: Samantha [name] MRN: [id number] Date of Birth: 1945/09/29

## 2017-11-16 ENCOUNTER — Telehealth (HOSPITAL_COMMUNITY): Payer: Self-pay

## 2017-11-16 ENCOUNTER — Ambulatory Visit (HOSPITAL_COMMUNITY): Payer: Medicare HMO

## 2017-11-16 NOTE — Telephone Encounter (Signed)
No show; pt stating she forgot about her appointment. PT reminded pt of next scheduled appointment and she stated she would write it down.  Geraldine Solar PT, DPT

## 2017-11-22 ENCOUNTER — Ambulatory Visit (HOSPITAL_COMMUNITY): Payer: Medicare HMO

## 2017-11-22 DIAGNOSIS — R293 Abnormal posture: Secondary | ICD-10-CM

## 2017-11-22 DIAGNOSIS — R29898 Other symptoms and signs involving the musculoskeletal system: Secondary | ICD-10-CM

## 2017-11-22 DIAGNOSIS — M542 Cervicalgia: Secondary | ICD-10-CM
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
## 2017-11-27 ENCOUNTER — Ambulatory Visit (HOSPITAL_COMMUNITY): Payer: Medicare HMO

## 2017-11-27 DIAGNOSIS — R29898 Other symptoms and signs involving the musculoskeletal system: Secondary | ICD-10-CM

## 2017-11-27 DIAGNOSIS — R293 Abnormal posture: Secondary | ICD-10-CM | POA: Diagnosis not present

## 2017-11-27 DIAGNOSIS — M542 Cervicalgia: Secondary | ICD-10-CM

## 2017-11-27 NOTE — Patient Instructions (Signed)
Access Code: [email or web address]  URL: [email or web address]  Date: 11/27/2017  Prepared by: Geraldine Solar   Exercises Seated Cervical Retraction - 10 reps - 3 sets - 5 hold - 1x daily - 7x weekly  3D thoracic excursions with UE x5-10 reps each arm

## 2017-11-27 NOTE — Therapy (Signed)
[place], [place], [address] Phone: [phone]   Fax:  [phone]  Physical Therapy Treatment  Patient Details  Name: Samantha [name] MRN: [id number] Date of Birth: 02-21-1946 Referring Provider: Edmonia Lynch, MD   Encounter Date: 11/27/2017  PT End of Session - 11/27/17 1436    Visit Number  8    Number of Visits  13    Date for PT Re-Evaluation  12/06/17    Authorization Type  [employer] H[employer]    Authorization Time Period  10/24/17 to 11/21/17; NEW: 11/22/17 to 12/06/17    PT Start Time  1437 pt arrived late    PT Stop Time  1517    PT Time Calculation (min)  40 min    Activity Tolerance  Patient tolerated treatment well    Behavior During Therapy  [facility] for tasks assessed/performed       Past Medical History:  Diagnosis Date  . Diabetes mellitus   . Hyperlipidemia   . Hypertension     Past Surgical History:  Procedure Laterality Date  . TUBAL LIGATION      There were no vitals filed for this visit.  Subjective Assessment - 11/27/17 1437    Subjective  Pt states she thought her appointment was yesterday and she came to the clinic, however, no one was available to see her. Her neck is feeling okay, she's not currently in pain.     Patient Stated Goals  relieve the pain    Currently in Pain?  No/denies    Pain Onset  More than a month ago           [facility] Adult PT Treatment/Exercise - 11/27/17 0001      Neck Exercises: Theraband   Scapula Retraction  15 reps;Green    Scapula Retraction Limitations  +cervical retraction    Shoulder Extension  15 reps;Green    Shoulder Extension Limitations  +cervical retraction    Rows  15 reps;Green    Rows Limitations  (high rows) +cervical retraction    Horizontal ABduction  15 reps;Green;Limitations    Other Theraband Exercises  D2 flexion PNF RTB x15reps (+cervical retraction)      Neck Exercises: Standing   Wall Push Ups  10 reps    Wall Push Ups  Limitations  2 sets (at counter)    Other Standing Exercises  shoulder taps in modified plantigrade x10 reps      Neck Exercises: Seated   Neck Retraction  15 reps;5 secs    Neck Retraction Limitations  improved mechanics, min cueing     Cervical Rotation  Both;10 reps;Limitations    Cervical Rotation Limitations  with towel    Other Seated Exercise  3D thoracic excusion with UE x5 each       Manual Therapy   Manual Therapy  Soft tissue mobilization    Manual therapy comments  completed separate rest of treatment    Soft tissue mobilization  STM L upper trap and levator scap for pain control      Neck Exercises: Stretches   Corner Stretch  2 reps;30 seconds    Other Neck Stretches  Scalene stretch with towel 2x 30" Bil            PT Education - 11/27/17 1437    Education provided  Yes    Education Details  exercise technique, continue HEP    Person(s) Educated  Patient    Methods  Explanation;Demonstration  Comprehension  Verbalized understanding;Returned demonstration;Need further instruction       PT Short Term Goals - 11/22/17 1452      PT SHORT TERM GOAL #1   Title  Pt will be independent with HEP and perform consistently in order to decrease pain and improve ROM.    Baseline  6/13: "sometimes"    Time  2    Period  Weeks    Status  On-going      PT SHORT TERM GOAL #2   Title  Pt will have improved cervical AROM by 5 deg throughout in order to decrease pain and maximize her ability to drive.    Baseline  6/13: see AROM    Time  2    Period  Weeks    Status  Partially Met        PT Long Term Goals - 11/22/17 1453      PT LONG TERM GOAL #1   Title  Pt will have improved cervical AROM by 10 deg or > for cervical flexion, extension, and bil rotation in order to further decrease pain and maximize ability to complete tasks at home with greater ease.    Baseline  6/13: see AROM    Time  4    Period  Weeks    Status  Partially Met      PT LONG TERM GOAL #2    Title  Pt will be able to perform deep neck flexor endurance test for 30 sec or > with proper form and without pain in order to demo improved cervical strength and decrease pain.    Baseline  6/13: 19sec    Time  4    Period  Weeks    Status  On-going      PT LONG TERM GOAL #3   Title  Pt will report being able to sit for 1 hour or > with proper posture and without increases in neck pain in order to maximize function at work.    Baseline  6/13: reports use of lumbar roll at work and reports that her neck pain hurts all the time    Time  4    Period  Weeks    Status  On-going      PT LONG TERM GOAL #4   Title  Pt will have min to no soft tissue restrictions of upper trap and levaotr scap and report min to no pain with palpation in order to demo improved soft tissue length and decrease her overall pain.     Baseline  6/13: min restrictions bil levator scap, not tender to palpation, mod restrictions bil upper trap, painful to palpation    Time  4    Period  Weeks    Status  Partially Met            Plan - 11/27/17 1518    Clinical Impression Statement  Pt arrived a few mins late to appointment again but able to perform full POC this date. Pt not currently in pain during session. Focused on cervical flexibility, postural strength, and address soft tissue restrictions. Good form noted with seated cervical retractions so added to HEP to progress from supine retractions. Able to add more standing postural/scap stabilizing strengthening exercises today; pt requiring min cues for proper technique throughout. Ended with manual STM for pain control and decreasing soft tissue restrictions. Pt reported feeling better at EOS. Continue as planned, progressing as able.     Rehab Potential  Fair  PT Frequency  2x / week    PT Duration  4 weeks    PT Treatment/Interventions  ADLs/Self Care Home Management;Cryotherapy;Electrical Stimulation;Moist Heat;Traction;Ultrasound;Functional mobility  training;Therapeutic activities;Therapeutic exercise;Balance training;Neuromuscular re-education;Patient/family education;Manual techniques;Scar mobilization;Passive range of motion;Dry needling;Energy conservation;Taping    PT Next Visit Plan  add prone scapular strengthening; Continue postural strengthening, continue thoracic and cervical mobility work; progress postural strengthening as tolerated; spinal joint mobs if wtih PT, STM for soft tissue restricitons    PT Home Exercise Plan  eval: supine cervical retractions; 5/29: upper trap and levator scap stretching; 6/13: scalene stretch wtih towel; 6/18: 3D thoracic excur with UE, seated cervical retraction    Consulted and Agree with Plan of Care  Patient       Patient will benefit from skilled therapeutic intervention in order to improve the following deficits and impairments:  Decreased endurance, Decreased mobility, Decreased range of motion, Decreased strength, Hypomobility, Increased fascial restricitons, Increased muscle spasms, Impaired flexibility, Impaired sensation, Impaired UE functional use, Improper body mechanics, Postural dysfunction, Pain  Visit Diagnosis: Cervicalgia  Abnormal posture  Other symptoms and signs involving the musculoskeletal system     Problem List Patient Active Problem List   Diagnosis Date Noted  . 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 DERANGEMENT MENISCUS 12/16/2007  . DIABETES MELLITUS, TYPE II, CONTROLLED 12/18/2006  . HYPERLIPIDEMIA 05/07/2006  . OBESITY NOS 05/07/2006  . ANEMIA-NOS 05/07/2006  . CARPAL TUNNEL SYNDROME 05/07/2006  . HYPERTENSION 05/07/2006       Geraldine Solar PT, DPT  [facility] [address] [place], [place], [address] Phone: [phone]   Fax:  [phone]  Name: PATRISHA HAUSMANN MRN: [id number] Date of Birth: 09/28/1945

## 2017-11-29 ENCOUNTER — Encounter (HOSPITAL_COMMUNITY): Payer: Medicare HMO | Admitting: Physical Therapy

## 2017-11-29 ENCOUNTER — Telehealth (HOSPITAL_COMMUNITY): Payer: Self-pay | Admitting: Physical Therapy

## 2017-11-29 NOTE — Telephone Encounter (Signed)
She has a funeral to attend today

## 2017-12-04 ENCOUNTER — Ambulatory Visit (HOSPITAL_COMMUNITY): Payer: Medicare HMO | Admitting: Physical Therapy

## 2017-12-04 DIAGNOSIS — R293 Abnormal posture: Secondary | ICD-10-CM

## 2017-12-04 DIAGNOSIS — M542 Cervicalgia: Secondary | ICD-10-CM

## 2017-12-04 DIAGNOSIS — R29898 Other symptoms and signs involving the musculoskeletal system: Secondary | ICD-10-CM | POA: Diagnosis not present

## 2017-12-04 NOTE — Therapy (Signed)
[place], [place], [address] Phone: [phone]   Fax:  [phone]  Physical Therapy Treatment  Patient Details  Name: Samantha [name] MRN: [id number] Date of Birth: April 06, 1946 Referring Provider: Edmonia Lynch, MD   Encounter Date: 12/04/2017  PT End of Session - 12/04/17 1642    Visit Number  9    Number of Visits  13    Date for PT Re-Evaluation  12/06/17    Authorization Type  [employer] H[employer]    Authorization Time Period  10/24/17 to 11/21/17; NEW: 11/22/17 to 12/06/17    PT Start Time  1530    PT Stop Time  1610    PT Time Calculation (min)  40 min    Activity Tolerance  Patient tolerated treatment well    Behavior During Therapy  [facility] for tasks assessed/performed       Past Medical History:  Diagnosis Date  . Diabetes mellitus   . Hyperlipidemia   . Hypertension     Past Surgical History:  Procedure Laterality Date  . TUBAL LIGATION      There were no vitals filed for this visit.  Subjective Assessment - 12/04/17 1541    Subjective  Pt reports a little improvement in her neck since beginning therapy.  4/10 currently    Currently in Pain?  Yes    Pain Score  4     Pain Location  Neck    Pain Orientation  Left    Pain Type  Chronic pain                       [facility] Adult PT Treatment/Exercise - 12/04/17 0001      Neck Exercises: Machines for Strengthening   UBE (Upper Arm Bike)  x4 mins retro, L1, postural strengthening      Neck Exercises: Theraband   Scapula Retraction  15 reps;Green    Scapula Retraction Limitations  +cervical retraction    Shoulder Extension  15 reps;Green    Shoulder Extension Limitations  +cervical retraction    Rows  15 reps;Green    Rows Limitations  (high rows) +cervical retraction    Horizontal ABduction  15 reps;Green;Limitations    Other Theraband Exercises  D2 flexion PNF RTB x15reps (+cervical retraction)      Neck Exercises: Standing   Wall Push Ups  10 reps    Wall Push Ups Limitations  at wall    Other Standing Exercises  wall arch and UE flexion 10 reps each      Neck Exercises: Seated   Neck Retraction  15 reps;5 secs    Cervical Rotation  Both;10 reps;Limitations    Cervical Rotation Limitations  with towel    Other Seated Exercise  3D thoracic excusion with UE x5 each                PT Short Term Goals - 11/22/17 1452      PT SHORT TERM GOAL #1   Title  Pt will be independent with HEP and perform consistently in order to decrease pain and improve ROM.    Baseline  6/13: "sometimes"    Time  2    Period  Weeks    Status  On-going      PT SHORT TERM GOAL #2   Title  Pt will have improved cervical AROM by 5 deg throughout in order to decrease pain and maximize her ability to drive.  Baseline  6/13: see AROM    Time  2    Period  Weeks    Status  Partially Met        PT Long Term Goals - 11/22/17 1453      PT LONG TERM GOAL #1   Title  Pt will have improved cervical AROM by 10 deg or > for cervical flexion, extension, and bil rotation in order to further decrease pain and maximize ability to complete tasks at home with greater ease.    Baseline  6/13: see AROM    Time  4    Period  Weeks    Status  Partially Met      PT LONG TERM GOAL #2   Title  Pt will be able to perform deep neck flexor endurance test for 30 sec or > with proper form and without pain in order to demo improved cervical strength and decrease pain.    Baseline  6/13: 19sec    Time  4    Period  Weeks    Status  On-going      PT LONG TERM GOAL #3   Title  Pt will report being able to sit for 1 hour or > with proper posture and without increases in neck pain in order to maximize function at work.    Baseline  6/13: reports use of lumbar roll at work and reports that her neck pain hurts all the time    Time  4    Period  Weeks    Status  On-going      PT LONG TERM GOAL #4   Title  Pt will have min to no soft tissue  restrictions of upper trap and levaotr scap and report min to no pain with palpation in order to demo improved soft tissue length and decrease her overall pain.     Baseline  6/13: min restrictions bil levator scap, not tender to palpation, mod restrictions bil upper trap, painful to palpation    Time  4    Period  Weeks    Status  Partially Met            Plan - 12/04/17 1648    Clinical Impression Statement  PT arrived approx 10 minutes late.  Contineud with cervical mobiltiy and postural strengthening.  Improved form with seated cervical mobilty, however continued cues to complete postural strengtheing with isolation as tends to move head and elevate shoulder musculature.  PT c/o dizziness after completing D2 theraband activity and assessed BP with 135/95.  PT reported this was slightly high for her but not that bad.  Pt able to continue without return of dizziness and BP remained the same at end of session.      Rehab Potential  Fair    PT Frequency  2x / week    PT Duration  4 weeks    PT Treatment/Interventions  ADLs/Self Care Home Management;Cryotherapy;Electrical Stimulation;Moist Heat;Traction;Ultrasound;Functional mobility training;Therapeutic activities;Therapeutic exercise;Balance training;Neuromuscular re-education;Patient/family education;Manual techniques;Scar mobilization;Passive range of motion;Dry needling;Energy conservation;Taping    PT Next Visit Plan  Re-evaluate next session.     PT Home Exercise Plan  eval: supine cervical retractions; 5/29: upper trap and levator scap stretching; 6/13: scalene stretch wtih towel; 6/18: 3D thoracic excur with UE, seated cervical retraction    Consulted and Agree with Plan of Care  Patient       Patient will benefit from skilled therapeutic intervention in order to improve the following deficits and impairments:  Decreased endurance, Decreased mobility, Decreased range of motion, Decreased strength, Hypomobility, Increased fascial  restricitons, Increased muscle spasms, Impaired flexibility, Impaired sensation, Impaired UE functional use, Improper body mechanics, Postural dysfunction, Pain  Visit Diagnosis: Cervicalgia  Abnormal posture  Other symptoms and signs involving the musculoskeletal system     Problem List Patient Active Problem List   Diagnosis Date Noted  . 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 DERANGEMENT MENISCUS 12/16/2007  . DIABETES MELLITUS, TYPE II, CONTROLLED 12/18/2006  . HYPERLIPIDEMIA 05/07/2006  . OBESITY NOS 05/07/2006  . ANEMIA-NOS 05/07/2006  . CARPAL TUNNEL SYNDROME 05/07/2006  . HYPERTENSION 05/07/2006   Teena Irani, PTA/CLT [phone]  Teena Irani 12/04/2017, 4:56 PM  [facility] [address] [place], [place], [address] Phone: [phone]   Fax:  [phone]  Name: JAZZMA NEIDHARDT MRN: [id number] Date of Birth: 02-06-1946

## 2017-12-06 ENCOUNTER — Encounter (HOSPITAL_COMMUNITY): Payer: Medicare HMO

## 2017-12-06 DIAGNOSIS — E6609 Other obesity due to excess calories: Secondary | ICD-10-CM | POA: Diagnosis not present

## 2017-12-06 DIAGNOSIS — I10 Essential (primary) hypertension: Secondary | ICD-10-CM | POA: Diagnosis not present

## 2017-12-06 DIAGNOSIS — E782 Mixed hyperlipidemia: Secondary | ICD-10-CM | POA: Diagnosis not present

## 2017-12-06 DIAGNOSIS — E119 Type 2 diabetes mellitus without complications: Secondary | ICD-10-CM | POA: Diagnosis not present

## 2017-12-10 ENCOUNTER — Telehealth (HOSPITAL_COMMUNITY): Payer: Self-pay | Admitting: Physical Therapy

## 2017-12-10 ENCOUNTER — Ambulatory Visit (HOSPITAL_COMMUNITY): Payer: Medicare HMO | Attending: Orthopedic Surgery | Admitting: Physical Therapy

## 2017-12-10 NOTE — Telephone Encounter (Signed)
PT did not show for appointment.  Called and left message on voicemail regarding miss appt and need to call clinic to reschedule another appointment for a re-evaluation. Teena Irani, PTA/CLT [phone]

## 2017-12-19 DIAGNOSIS — M25551 Pain in right hip: Secondary | ICD-10-CM | POA: Diagnosis not present

## 2017-12-19 DIAGNOSIS — S336XXA Sprain of sacroiliac joint, initial encounter: Secondary | ICD-10-CM | POA: Diagnosis not present

## 2018-01-09 ENCOUNTER — Encounter (HOSPITAL_COMMUNITY): Payer: Self-pay

## 2018-01-09 NOTE — Therapy (Signed)
[facility] [address] [place], [place], [address] Phone: [phone]   Fax:  [phone]  Patient Details  Name: Samantha [name] MRN: [id number] Date of Birth: 12/30/1945 Referring Provider:  No ref. provider found  Encounter Date: 01/09/2018   PHYSICAL THERAPY DISCHARGE SUMMARY  Visits from Start of Care: 9  Current functional level related to goals / functional outcomes: See last treatment note   Remaining deficits: See last treatment note   Education / Equipment: n/a  Plan: Patient agrees to discharge.  Patient goals were partially met. Patient is being discharged due to not returning since the last visit.  ?????       PT, DPT    [facility] [address] [place], [place], [address] Phone: [phone]   Fax:  [phone] 

## 2018-01-16 DIAGNOSIS — Z6837 Body mass index (BMI) 37.0-37.9, adult: Secondary | ICD-10-CM | POA: Diagnosis not present

## 2018-01-16 DIAGNOSIS — E119 Type 2 diabetes mellitus without complications: Secondary | ICD-10-CM | POA: Diagnosis not present

## 2018-01-16 DIAGNOSIS — E782 Mixed hyperlipidemia: Secondary | ICD-10-CM | POA: Diagnosis not present

## 2018-01-16 DIAGNOSIS — M130 Polyarthritis, unspecified: Secondary | ICD-10-CM | POA: Diagnosis not present

## 2018-03-21 DIAGNOSIS — E669 Obesity, unspecified: Secondary | ICD-10-CM | POA: Diagnosis not present

## 2018-03-21 DIAGNOSIS — I10 Essential (primary) hypertension: Secondary | ICD-10-CM | POA: Diagnosis not present

## 2018-03-21 DIAGNOSIS — E783 Hyperchylomicronemia: Secondary | ICD-10-CM | POA: Diagnosis not present

## 2018-03-21 DIAGNOSIS — E782 Mixed hyperlipidemia: Secondary | ICD-10-CM | POA: Diagnosis not present

## 2018-03-21 DIAGNOSIS — E119 Type 2 diabetes mellitus without complications: Secondary | ICD-10-CM | POA: Diagnosis not present

## 2018-05-08 ENCOUNTER — Encounter (HOSPITAL_COMMUNITY): Payer: Self-pay | Admitting: Emergency Medicine

## 2018-05-08 ENCOUNTER — Emergency Department (HOSPITAL_COMMUNITY)
Admission: EM | Admit: 2018-05-08 | Discharge: 2018-05-09 | Disposition: A | Discharge status: Home / Self Care | Payer: Medicare HMO | Attending: Emergency Medicine | Admitting: Emergency Medicine

## 2018-05-08 ENCOUNTER — Other Ambulatory Visit: Payer: Self-pay

## 2018-05-08 DIAGNOSIS — M62838 Other muscle spasm: Secondary | ICD-10-CM | POA: Insufficient documentation

## 2018-05-08 DIAGNOSIS — Z7984 Long term (current) use of oral hypoglycemic drugs: Secondary | ICD-10-CM | POA: Diagnosis not present

## 2018-05-08 DIAGNOSIS — Z7982 Long term (current) use of aspirin: Secondary | ICD-10-CM | POA: Diagnosis not present

## 2018-05-08 DIAGNOSIS — Z79899 Other long term (current) drug therapy: Secondary | ICD-10-CM | POA: Insufficient documentation

## 2018-05-08 DIAGNOSIS — I10 Essential (primary) hypertension: Secondary | ICD-10-CM | POA: Insufficient documentation

## 2018-05-08 DIAGNOSIS — M7918 Myalgia, other site: Secondary | ICD-10-CM | POA: Diagnosis present

## 2018-05-08 DIAGNOSIS — E119 Type 2 diabetes mellitus without complications: Secondary | ICD-10-CM | POA: Insufficient documentation

## 2018-05-08 LAB — COMPREHENSIVE METABOLIC PANEL
ALBUMIN: 4.1 g/dL (ref 3.5–5.0)
ALK PHOS: 63 U/L (ref 38–126)
ALT: 14 U/L (ref 0–44)
AST: 19 U/L (ref 15–41)
Anion gap: 9 (ref 5–15)
BUN: 14 mg/dL (ref 8–23)
CALCIUM: 9.2 mg/dL (ref 8.9–10.3)
CHLORIDE: 108 mmol/L (ref 98–111)
CO2: 23 mmol/L (ref 22–32)
CREATININE: 1.15 mg/dL — AB (ref 0.44–1.00)
GFR calc non Af Amer: 48 mL/min — ABNORMAL LOW (ref >60)
GFR, EST AFRICAN AMERICAN: 55 mL/min — AB (ref >60)
GLUCOSE: 125 mg/dL — AB (ref 70–99)
Potassium: 3.8 mmol/L (ref 3.5–5.1)
SODIUM: 140 mmol/L (ref 135–145)
Total Bilirubin: 0.6 mg/dL (ref 0.3–1.2)
Total Protein: 7.4 g/dL (ref 6.5–8.1)

## 2018-05-08 LAB — CBC WITH DIFFERENTIAL/PLATELET
ABS IMMATURE GRANULOCYTES: 0.02 10*3/uL (ref 0.00–0.07)
BASOS ABS: 0.1 10*3/uL (ref 0.0–0.1)
BASOS PCT: 1 %
Eosinophils Absolute: 0.1 10*3/uL (ref 0.0–0.5)
Eosinophils Relative: 1 %
HCT: 37.7 % (ref 36.0–46.0)
HEMOGLOBIN: 11 g/dL — AB (ref 12.0–15.0)
Immature Granulocytes: 0 %
LYMPHS PCT: 30 %
Lymphs Abs: 2.2 10*3/uL (ref 0.7–4.0)
MCH: 24.7 pg — AB (ref 26.0–34.0)
MCHC: 29.2 g/dL — ABNORMAL LOW (ref 30.0–36.0)
MCV: 84.7 fL (ref 80.0–100.0)
MONO ABS: 0.5 10*3/uL (ref 0.1–1.0)
Monocytes Relative: 7 %
NEUTROS ABS: 4.7 10*3/uL (ref 1.7–7.7)
NEUTROS PCT: 61 %
NRBC: 0 % (ref 0.0–0.2)
PLATELETS: 270 10*3/uL (ref 150–400)
RBC: 4.45 MIL/uL (ref 3.87–5.11)
RDW: 14.4 % (ref 11.5–15.5)
WBC: 7.6 10*3/uL (ref 4.0–10.5)

## 2018-05-08 MED ORDER — CYCLOBENZAPRINE HCL 10 MG PO TABS
5.0000 mg | ORAL_TABLET | Freq: Once | ORAL | Status: AC
  Administered 2018-05-08: 5 mg via ORAL
  Filled 2018-05-08: qty 1

## 2018-05-08 NOTE — ED Triage Notes (Signed)
Pt c/o cramping to arms, legs and hands that started today.

## 2018-05-09 MED ORDER — CYCLOBENZAPRINE HCL 5 MG PO TABS: 5.0000 mg | ORAL_TABLET | Freq: Three times a day (TID) | ORAL | 0 refills | Status: DC | PRN

## 2018-05-09 NOTE — ED Provider Notes (Signed)
[facility] EMERGENCY DEPARTMENT Provider Note   CSN: [id number] Arrival date & time: 05/08/18  2157     History   Chief Complaint Chief Complaint  Patient presents with  . Generalized Body Aches    HPI Samantha [name] is a 72 y.o. female with a history of well-controlled diabetes stating her last CBG today was 140, hyperlipidemia, hypertension and infrequent episodes of problems with hypokalemia with a 1 day history of intermittent muscle spasms.  She has noticed cramping in both her arms and legs including her hands that started earlier today and she is concerned her potassium level may be reduced.  She has found no alleviators for her symptoms.  She does take a potassium supplement as she is also on chlorothiazide.  She endorses that she drinks plenty of fluids and does not feel she is dehydrated.  She has had similar episodes in the past which have improved with muscle relaxers.  She denies fevers or chills, weakness, dizziness.  She has increased her fluid intake today since the spasms started out improvement.  The history is provided by the patient and a relative.    Past Medical History:  Diagnosis Date  . Diabetes mellitus   . Hyperlipidemia   . Hypertension     Patient Active Problem List   Diagnosis Date Noted  . 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 DERANGEMENT MENISCUS 12/16/2007  . DIABETES MELLITUS, TYPE II, CONTROLLED 12/18/2006  . HYPERLIPIDEMIA 05/07/2006  . OBESITY NOS 05/07/2006  . ANEMIA-NOS 05/07/2006  . CARPAL TUNNEL SYNDROME 05/07/2006  . HYPERTENSION 05/07/2006    Past Surgical History:  Procedure Laterality Date  . TUBAL LIGATION       OB History   None      Home Medications    Prior to Admission medications   Medication Sig Start Date End Date Taking? Authorizing Provider  acetaminophen (TYLENOL) 500 MG tablet Take 1,000 mg by mouth every 6 (six) hours as needed for mild pain.   Yes [provider]  amLODipine (NORVASC) 5  MG tablet Take 5 mg by mouth 2 (two) times daily.   Yes [provider]  aspirin EC 81 MG tablet Take 81 mg by mouth daily.   Yes [provider]  cloNIDine (CATAPRES) 0.1 MG tablet Take 0.1 mg by mouth daily.    Yes [provider]  Cyanocobalamin ([employer] B-12) 1500 MCG TBDP Take 1 tablet by mouth daily.   Yes [provider]  ezetimibe (ZETIA) 10 MG tablet Take 10 mg by mouth daily. 08/29/17  Yes [provider]  Homeopathic Products ([employer]S LEG CRAMPS PAIN RELIEF PO) Take 1 tablet by mouth daily as needed (for cramping).    Yes [provider]  linagliptin (TRADJENTA) 5 MG TABS tablet Take 5 mg by mouth daily.    Yes [provider]  lisinopril-hydrochlorothiazide (PRINZIDE,ZESTORETIC) 20-12.5 MG per tablet Take 2 tablets by mouth daily.   Yes [provider]  metFORMIN (GLUCOPHAGE) 500 MG tablet Take 500 mg by mouth 2 (two) times daily with a meal.   Yes [provider]  potassium chloride SA (K-DUR,KLOR-CON) 20 MEQ tablet Take 20 mEq by mouth daily.   Yes [provider]  cyclobenzaprine (FLEXERIL) 5 MG tablet Take 1 tablet (5 mg total) by mouth 3 (three) times daily as needed for muscle spasms. 05/08/18   Evalee Jefferson, PA-C    Family History No family history on file.  Social History Social History   Tobacco Use  .  Smoking status: Never Smoker  . Smokeless tobacco: Never Used  Substance Use Topics  . Alcohol use: No  . Drug use: No     Allergies   Patient has no known allergies.   Review of Systems Review of Systems  Constitutional: Negative for chills and fever.  HENT: Negative for congestion.   Eyes: Negative.   Respiratory: Negative for chest tightness and shortness of breath.   Cardiovascular: Negative for chest pain and palpitations.  Gastrointestinal: Negative for nausea and vomiting.  Genitourinary: Negative.   Musculoskeletal: Positive for myalgias. Negative for arthralgias, joint  swelling and neck pain.  Skin: Negative.  Negative for rash and wound.  Neurological: Negative for dizziness, weakness, light-headedness, numbness and headaches.  Psychiatric/Behavioral: Negative.      Physical Exam Updated Vital Signs BP 135/79   Pulse 71   Temp 97.9 F (36.6 C) (Oral)   Resp 17   Ht 5\' 1"  (1.549 m)   Wt 86.2 kg   SpO2 99%   BMI 35.90 kg/m   Physical Exam  Constitutional: She is oriented to person, place, and time. She appears well-developed and well-nourished.  HENT:  Head: Normocephalic and atraumatic.  Eyes: Conjunctivae are normal.  Neck: Normal range of motion. Neck supple.  Cardiovascular: Normal rate, regular rhythm, normal heart sounds and intact distal pulses.  Pulmonary/Chest: Effort normal and breath sounds normal.  Musculoskeletal: Normal range of motion. She exhibits no edema or tenderness.  No appreciable muscle spasms during exam.  All 4 extremities patient was all 4 extremities without deficit.  Neurological: She is alert and oriented to person, place, and time. She has normal strength. No sensory deficit. She exhibits normal muscle tone.  Reflex Scores:      Bicep reflexes are 2+ on the right side and 2+ on the left side.      Patellar reflexes are 2+ on the right side and 2+ on the left side. Equal grip strength.  Skin: Skin is warm and dry.  Psychiatric: She has a normal mood and affect.  Nursing note and vitals reviewed.    ED Treatments / Results  Labs (all labs ordered are listed, but only abnormal results are displayed) Labs Reviewed  CBC WITH DIFFERENTIAL/PLATELET - Abnormal; Notable for the following components:      Result Value   Hemoglobin 11.0 (*)    MCH 24.7 (*)    MCHC 29.2 (*)    All other components within normal limits  COMPREHENSIVE METABOLIC PANEL - Abnormal; Notable for the following components:   Glucose, Bld 125 (*)    Creatinine, Ser 1.15 (*)    GFR calc non Af Amer 48 (*)    GFR calc Af Amer 55 (*)     All other components within normal limits    EKG None  Radiology No results found.  Procedures Procedures (including critical care time)  Medications Ordered in ED Medications  cyclobenzaprine (FLEXERIL) tablet 5 mg (5 mg Oral Given 05/08/18 2236)     Initial Impression / Assessment and Plan / ED Course  I have reviewed the triage vital signs and the nursing notes.  Pertinent labs & imaging results that were available during my care of the patient were reviewed by me and considered in my medical decision making (see chart for details).     Patient's labs were reviewed and discussed with her.  There are no changes in her laboratory values compared to prior blood tests.  She does have mild anemia which is a  chronic finding.  Her potassium and calcium are normal range.  Patient was given a 5 mg of Flexeril tablet here and she is getting some relief of the spasms.  She was prescribed additional medication, strong precautions given regarding potential side effects including drowsiness.  Patient states has had this medication in the past and is tolerated it well.  Advised follow-up with her PCP for recheck if her symptoms persist, also advised return here for any worsening or new symptoms.  The patient appears reasonably screened and/or stabilized for discharge and I doubt any other medical condition or other [facility] requiring further screening, evaluation, or treatment in the ED at this time prior to discharge.  Final Clinical Impressions(s) / ED Diagnoses   Final diagnoses:  Muscle spasm    ED Discharge Orders         Ordered    cyclobenzaprine (FLEXERIL) 5 MG tablet  3 times daily PRN     05/09/18 0000           Evalee Jefferson, PA-C 05/09/18 0052    Maudie Flakes, MD 05/10/18 1459

## 2018-05-09 NOTE — Discharge Instructions (Addendum)
As discussed, you lab tests including your potassium, calcium and sodium levels are normal tonight.  Make sure you are staying well hydrated. You may use the muscle relaxer prescribed, but use caution with this medicine - it may make you sleepy and less alert than normal.  Do not drive within 6 hours of taking this medicine.

## 2018-06-11 ENCOUNTER — Other Ambulatory Visit: Payer: Self-pay | Admitting: Obstetrics and Gynecology

## 2018-06-11 ENCOUNTER — Other Ambulatory Visit (HOSPITAL_COMMUNITY)
Admission: RE | Admit: 2018-06-11 | Discharge: 2018-06-11 | Disposition: A | Discharge status: Home / Self Care | Payer: Medicare HMO | Source: Ambulatory Visit | Attending: Obstetrics and Gynecology | Admitting: Obstetrics and Gynecology

## 2018-06-11 DIAGNOSIS — Z01411 Encounter for gynecological examination (general) (routine) with abnormal findings: Secondary | ICD-10-CM | POA: Diagnosis not present

## 2018-06-11 DIAGNOSIS — N898 Other specified noninflammatory disorders of vagina: Secondary | ICD-10-CM | POA: Diagnosis not present

## 2018-06-11 DIAGNOSIS — Z01419 Encounter for gynecological examination (general) (routine) without abnormal findings: Secondary | ICD-10-CM | POA: Diagnosis not present

## 2018-06-11 DIAGNOSIS — Z113 Encounter for screening for infections with a predominantly sexual mode of transmission: Secondary | ICD-10-CM | POA: Diagnosis not present

## 2018-06-18 LAB — CYTOLOGY - PAP
CHLAMYDIA, DNA PROBE: NEGATIVE
DIAGNOSIS: NEGATIVE
HPV: NOT DETECTED
NEISSERIA GONORRHEA: NEGATIVE

## 2018-07-10 DIAGNOSIS — H524 Presbyopia: Secondary | ICD-10-CM | POA: Diagnosis not present

## 2018-08-26 DIAGNOSIS — I10 Essential (primary) hypertension: Secondary | ICD-10-CM | POA: Diagnosis not present

## 2018-08-26 DIAGNOSIS — E1165 Type 2 diabetes mellitus with hyperglycemia: Secondary | ICD-10-CM | POA: Diagnosis not present

## 2018-08-26 DIAGNOSIS — E783 Hyperchylomicronemia: Secondary | ICD-10-CM | POA: Diagnosis not present

## 2018-08-26 DIAGNOSIS — F341 Dysthymic disorder: Secondary | ICD-10-CM | POA: Diagnosis not present

## 2018-08-26 DIAGNOSIS — E782 Mixed hyperlipidemia: Secondary | ICD-10-CM | POA: Diagnosis not present

## 2018-08-26 DIAGNOSIS — E6609 Other obesity due to excess calories: Secondary | ICD-10-CM | POA: Diagnosis not present

## 2018-08-26 DIAGNOSIS — E119 Type 2 diabetes mellitus without complications: Secondary | ICD-10-CM | POA: Diagnosis not present

## 2018-09-19 DIAGNOSIS — E785 Hyperlipidemia, unspecified: Secondary | ICD-10-CM | POA: Diagnosis not present

## 2018-09-19 DIAGNOSIS — I10 Essential (primary) hypertension: Secondary | ICD-10-CM | POA: Diagnosis not present

## 2018-09-19 DIAGNOSIS — Z7189 Other specified counseling: Secondary | ICD-10-CM | POA: Diagnosis not present

## 2018-09-19 DIAGNOSIS — E1169 Type 2 diabetes mellitus with other specified complication: Secondary | ICD-10-CM | POA: Diagnosis not present

## 2018-10-30 DIAGNOSIS — E113293 Type 2 diabetes mellitus with mild nonproliferative diabetic retinopathy without macular edema, bilateral: Secondary | ICD-10-CM | POA: Diagnosis not present

## 2018-10-30 DIAGNOSIS — H25011 Cortical age-related cataract, right eye: Secondary | ICD-10-CM | POA: Diagnosis not present

## 2018-10-30 DIAGNOSIS — H25013 Cortical age-related cataract, bilateral: Secondary | ICD-10-CM | POA: Diagnosis not present

## 2018-10-30 DIAGNOSIS — H35033 Hypertensive retinopathy, bilateral: Secondary | ICD-10-CM | POA: Diagnosis not present

## 2018-10-30 DIAGNOSIS — H2513 Age-related nuclear cataract, bilateral: Secondary | ICD-10-CM | POA: Diagnosis not present

## 2018-10-30 DIAGNOSIS — H40033 Anatomical narrow angle, bilateral: Secondary | ICD-10-CM | POA: Diagnosis not present

## 2018-11-19 DIAGNOSIS — H2511 Age-related nuclear cataract, right eye: Secondary | ICD-10-CM | POA: Diagnosis not present

## 2018-11-19 DIAGNOSIS — H25811 Combined forms of age-related cataract, right eye: Secondary | ICD-10-CM | POA: Diagnosis not present

## 2018-12-19 DIAGNOSIS — F430 Acute stress reaction: Secondary | ICD-10-CM | POA: Diagnosis not present

## 2018-12-19 DIAGNOSIS — Z0000 Encounter for general adult medical examination without abnormal findings: Secondary | ICD-10-CM | POA: Diagnosis not present

## 2018-12-19 DIAGNOSIS — E1169 Type 2 diabetes mellitus with other specified complication: Secondary | ICD-10-CM | POA: Diagnosis not present

## 2018-12-19 DIAGNOSIS — E785 Hyperlipidemia, unspecified: Secondary | ICD-10-CM | POA: Diagnosis not present

## 2018-12-19 DIAGNOSIS — E6609 Other obesity due to excess calories: Secondary | ICD-10-CM | POA: Diagnosis not present

## 2019-02-27 ENCOUNTER — Other Ambulatory Visit: Payer: Self-pay | Admitting: *Deleted

## 2019-02-27 DIAGNOSIS — Z20822 Contact with and (suspected) exposure to covid-19: Secondary | ICD-10-CM

## 2019-02-27 DIAGNOSIS — R6889 Other general symptoms and signs: Secondary | ICD-10-CM | POA: Diagnosis not present

## 2019-02-28 LAB — NOVEL CORONAVIRUS, NAA: SARS-CoV-2, NAA: NOT DETECTED

## 2019-04-26 IMAGING — CT CT RENAL STONE PROTOCOL
2 of 4 series · 16 of 46 positions shown, 18 images · non-contrast
Comparison: None.

CLINICAL DATA: Right flank pain.

EXAM:
CT ABDOMEN AND PELVIS WITHOUT CONTRAST
TECHNIQUE: Multidetector CT imaging of the abdomen and pelvis was performed
following the standard protocol without IV contrast.

[Series 2: axial st · axial · 0.73mm/px · z∈[+800,+1200]mm · 13 of 88 slices shown, 15 images]
[im 4/88  soft-tissue]
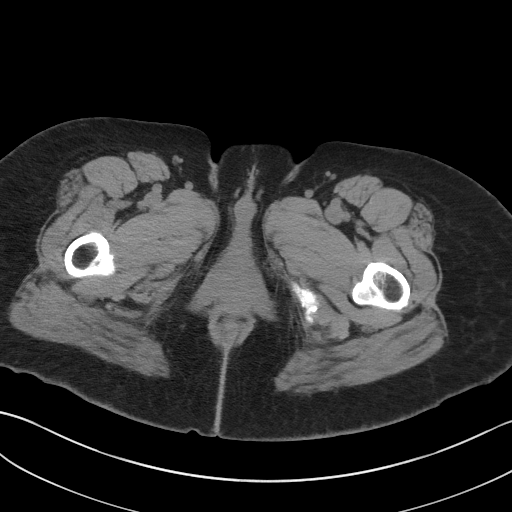
[im 4/88  bone]
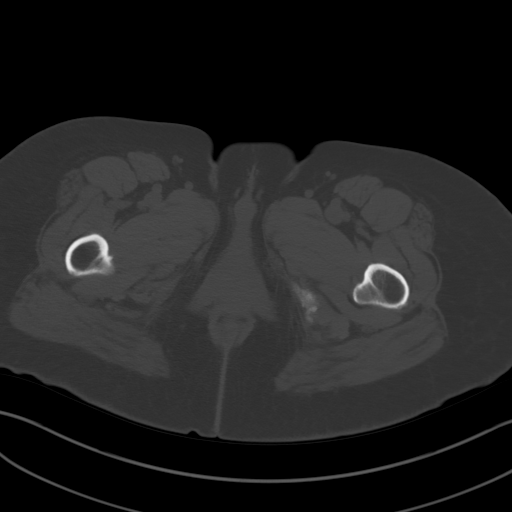
[im 11/88  soft-tissue]
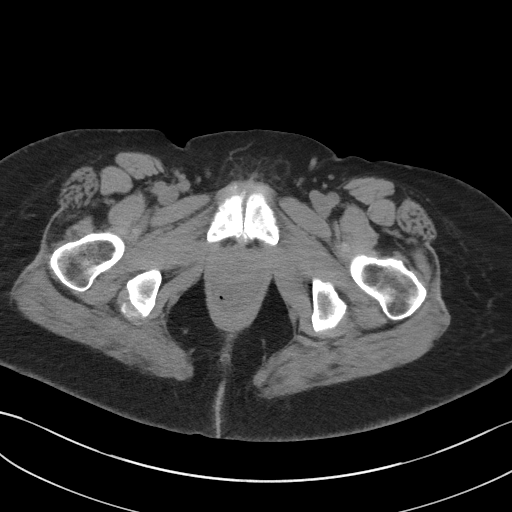
[im 19/88  soft-tissue]
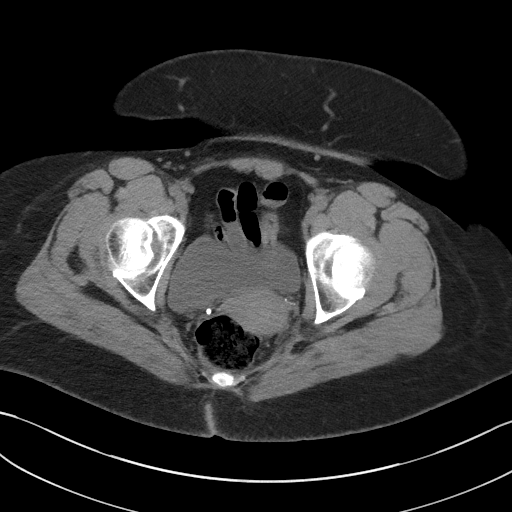
[im 26/88  soft-tissue]
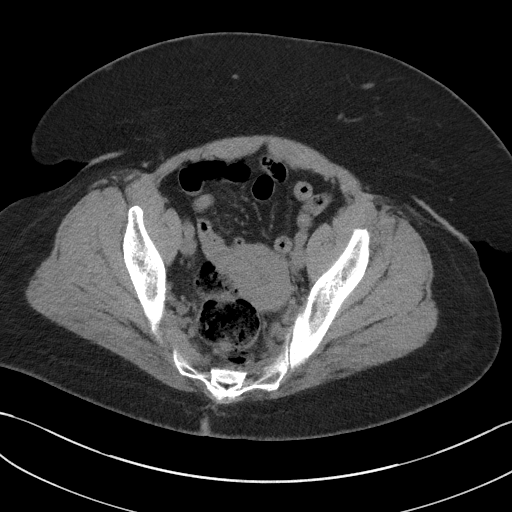
[im 30/88  soft-tissue]
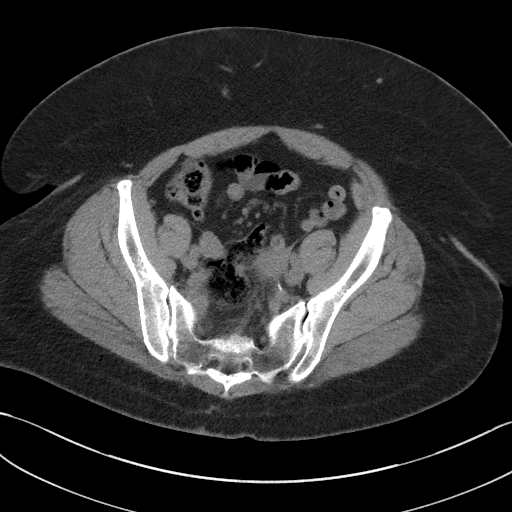
[im 37/88  soft-tissue]
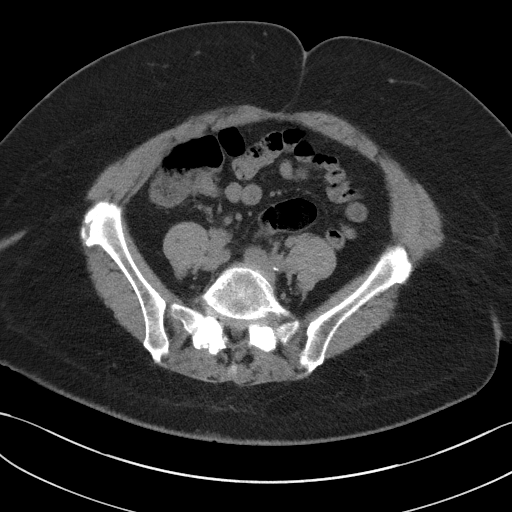
[im 44/88  soft-tissue]
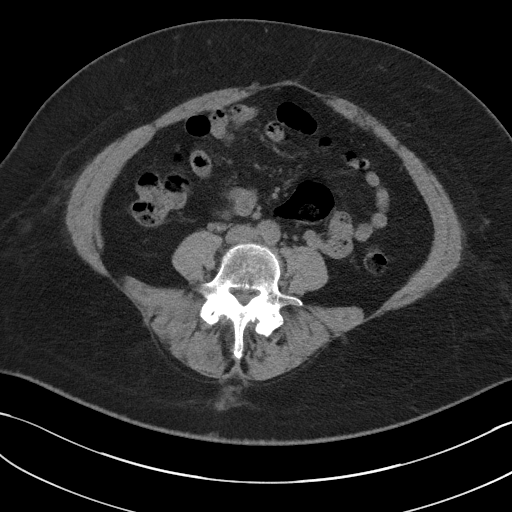
[im 51/88  soft-tissue]
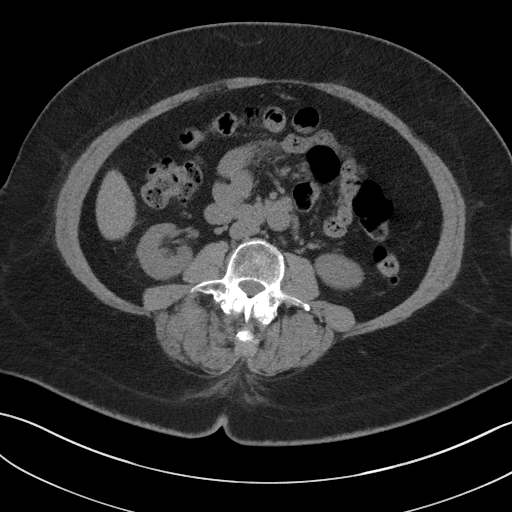
[im 59/88  soft-tissue]
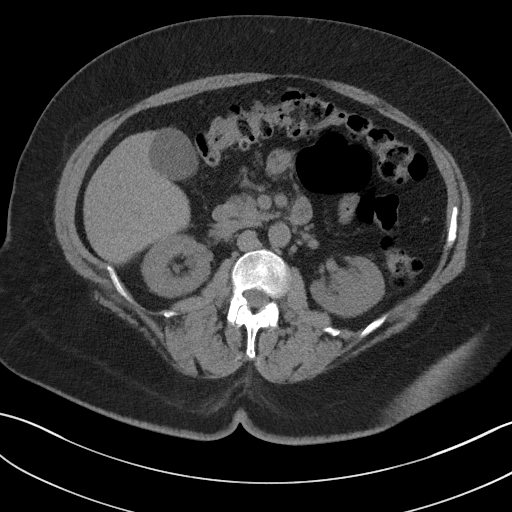
[im 59/88  bone]
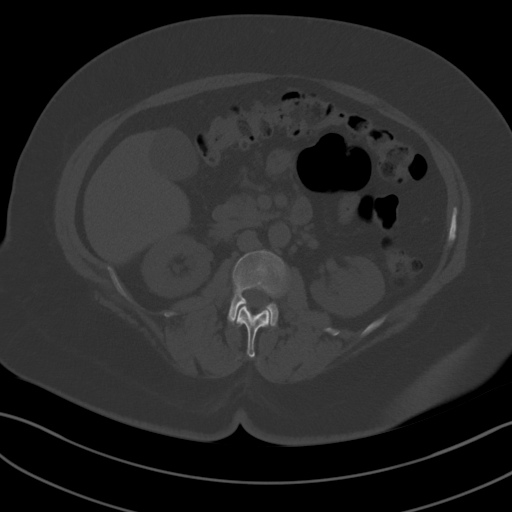
[im 62/88  soft-tissue]
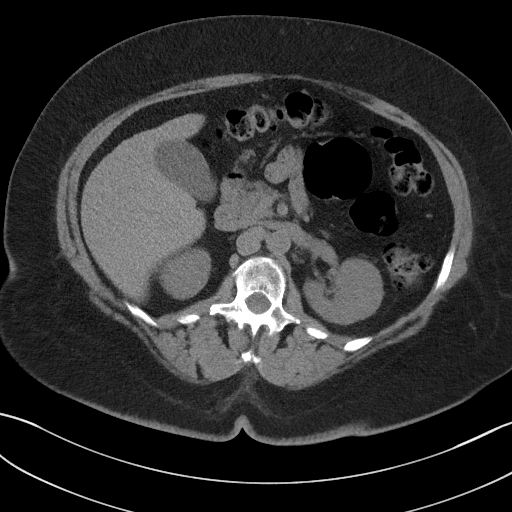
[im 69/88  soft-tissue]
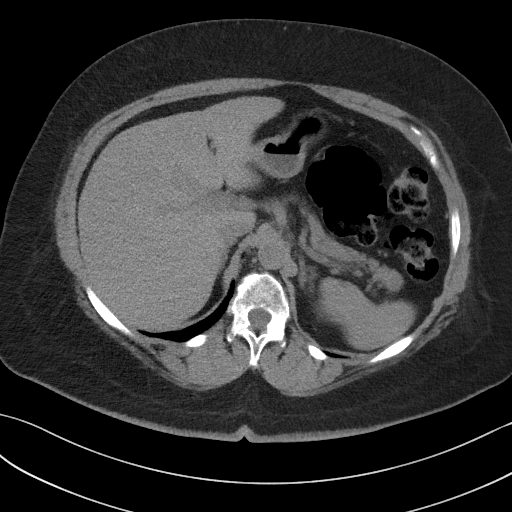
[im 77/88  soft-tissue]
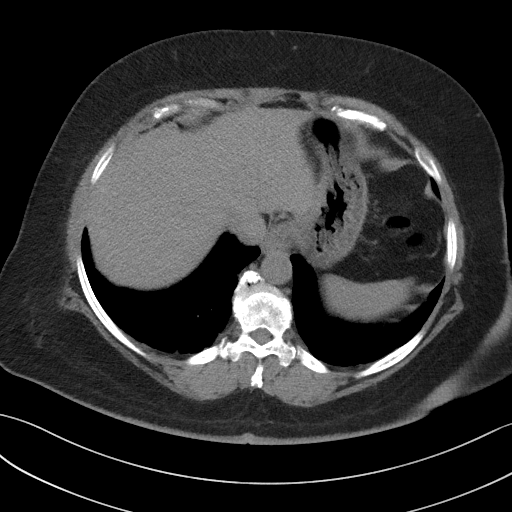
[im 84/88  soft-tissue]
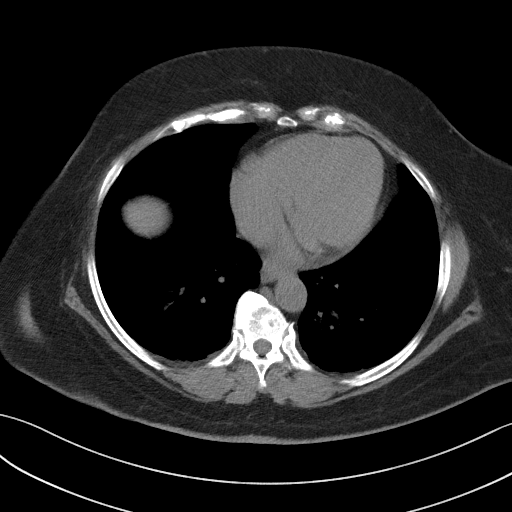

[Series 5: coronal st · coronal · 0.74mm/px · 3 of 101 slices shown]
[im 34/101  soft-tissue]
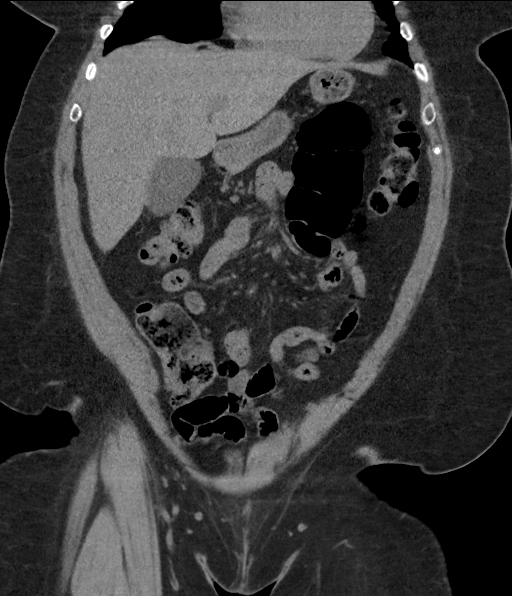
[im 45/101  soft-tissue]
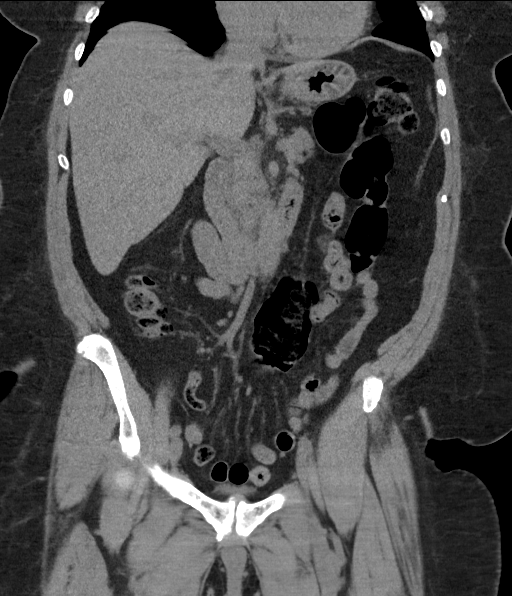
[im 56/101  soft-tissue]
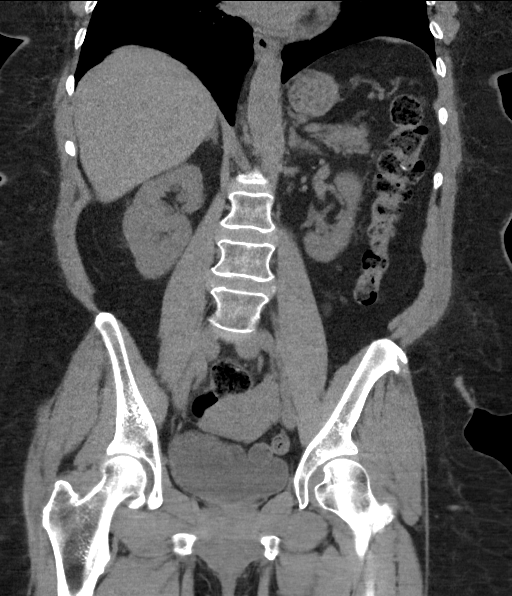

[16 of 46 positions shown; findings below may reference images not displayed]

FINDINGS: Lower chest: Minimal atelectasis or scarring at the lung bases.

Hepatobiliary: 4 mm low-density lesion in the dome of the right lobe
of the liver and possible 6 mm low-density lesion in the lateral
aspect of the left lobe of the liver, probably representing tiny
liver cysts but too small to completely characterize. The liver and
biliary tree are otherwise normal.

Pancreas: Unremarkable. No pancreatic ductal dilatation or
surrounding inflammatory changes.

Spleen: Normal in size without focal abnormality.

Adrenals/Urinary Tract: Adrenal glands are unremarkable. Kidneys are
normal, without renal calculi, focal lesion, or hydronephrosis.
Bladder is unremarkable.

Stomach/Bowel: Stomach is within normal limits. Appendix appears
normal. No evidence of bowel wall thickening, distention, or
inflammatory changes.

Vascular/Lymphatic: No significant vascular findings are present. No
enlarged abdominal or pelvic lymph nodes.

Reproductive: Uterus and bilateral adnexa are unremarkable.

Other: No abdominal wall hernia or abnormality. No abdominopelvic
ascites.

Musculoskeletal: No acute abnormality. Moderately severe to severe
spinal stenosis at L3-4 and L4-5. Broad-based disc protrusion at
L5-S1. Moderately severe facet arthritis in the lower lumbar spine.
IMPRESSION: No acute abnormalities. Specifically, no evidence of renal or
ureteral or bladder calculi.

## 2019-05-22 DIAGNOSIS — E785 Hyperlipidemia, unspecified: Secondary | ICD-10-CM | POA: Diagnosis not present

## 2019-05-22 DIAGNOSIS — E1169 Type 2 diabetes mellitus with other specified complication: Secondary | ICD-10-CM | POA: Diagnosis not present

## 2019-05-22 DIAGNOSIS — E6609 Other obesity due to excess calories: Secondary | ICD-10-CM | POA: Diagnosis not present

## 2019-05-22 DIAGNOSIS — I10 Essential (primary) hypertension: Secondary | ICD-10-CM | POA: Diagnosis not present

## 2019-06-18 ENCOUNTER — Ambulatory Visit: Payer: Medicare HMO | Attending: Internal Medicine

## 2019-06-18 ENCOUNTER — Other Ambulatory Visit: Payer: Self-pay

## 2019-06-18 DIAGNOSIS — Z20822 Contact with and (suspected) exposure to covid-19: Secondary | ICD-10-CM

## 2019-06-20 LAB — NOVEL CORONAVIRUS, NAA: SARS-CoV-2, NAA: NOT DETECTED

## 2019-06-24 ENCOUNTER — Ambulatory Visit: Payer: Medicare HMO | Attending: Internal Medicine

## 2019-06-24 ENCOUNTER — Other Ambulatory Visit: Payer: Self-pay

## 2019-06-24 DIAGNOSIS — Z20822 Contact with and (suspected) exposure to covid-19: Secondary | ICD-10-CM | POA: Diagnosis not present

## 2019-06-25 LAB — NOVEL CORONAVIRUS, NAA: SARS-CoV-2, NAA: NOT DETECTED

## 2019-07-20 ENCOUNTER — Ambulatory Visit: Payer: Medicare HMO | Attending: Internal Medicine

## 2019-07-20 ENCOUNTER — Other Ambulatory Visit: Payer: Self-pay

## 2019-07-20 DIAGNOSIS — Z23 Encounter for immunization: Secondary | ICD-10-CM | POA: Insufficient documentation

## 2019-07-20 NOTE — Progress Notes (Signed)
   Covid-19 Vaccination Clinic  Name:  KHANDI DEMAURO    MRN: [id number] DOB: 1945/10/08  07/20/2019  Ms. Thull was observed post Covid-19 immunization for 15 minutes without incidence. She was provided with Vaccine Information Sheet and instruction to access the V-Safe system.   Ms. Shiraishi was instructed to call 911 with any severe reactions post vaccine: [name] Kitchen Difficulty breathing  . Swelling of your face and throat  . A fast heartbeat  . A bad rash all over your body  . Dizziness and weakness    Immunizations Administered    Name Date Dose VIS Date Route   Moderna COVID-19 Vaccine 07/20/2019  2:37 PM 0.5 mL 05/13/2019 Intramuscular   Manufacturer: Moderna   Lot: [id number]   MusePO:[id number]

## 2019-08-07 DIAGNOSIS — T50Z95A Adverse effect of other vaccines and biological substances, initial encounter: Secondary | ICD-10-CM | POA: Diagnosis not present

## 2019-08-07 DIAGNOSIS — E876 Hypokalemia: Secondary | ICD-10-CM | POA: Diagnosis not present

## 2019-08-07 DIAGNOSIS — R252 Cramp and spasm: Secondary | ICD-10-CM | POA: Diagnosis not present

## 2019-08-07 DIAGNOSIS — E782 Mixed hyperlipidemia: Secondary | ICD-10-CM | POA: Diagnosis not present

## 2019-08-07 DIAGNOSIS — F341 Dysthymic disorder: Secondary | ICD-10-CM | POA: Diagnosis not present

## 2019-08-07 DIAGNOSIS — E1169 Type 2 diabetes mellitus with other specified complication: Secondary | ICD-10-CM | POA: Diagnosis not present

## 2019-08-07 DIAGNOSIS — M130 Polyarthritis, unspecified: Secondary | ICD-10-CM | POA: Diagnosis not present

## 2019-08-20 ENCOUNTER — Ambulatory Visit: Payer: Medicare HMO | Attending: Internal Medicine

## 2019-08-20 DIAGNOSIS — Z23 Encounter for immunization: Secondary | ICD-10-CM

## 2019-08-20 NOTE — Progress Notes (Signed)
   Covid-19 Vaccination Clinic  Name:  HAMNA BRUCATO    MRN: [id number] DOB: 02-18-1946  08/20/2019  Ms. Donahey was observed post Covid-19 immunization for 15 minutes without incident. She was provided with Vaccine Information Sheet and instruction to access the V-Safe system.   Ms. Speth was instructed to call 911 with any severe reactions post vaccine: [name] Kitchen Difficulty breathing  . Swelling of face and throat  . A fast heartbeat  . A bad rash all over body  . Dizziness and weakness   Immunizations Administered    Name Date Dose VIS Date Route   Moderna COVID-19 Vaccine 08/20/2019  2:57 PM 0.5 mL 05/13/2019 Intramuscular   Manufacturer: Moderna   Lot: [id number]   [place] MountainPO:[id number]

## 2019-09-04 DIAGNOSIS — E785 Hyperlipidemia, unspecified: Secondary | ICD-10-CM | POA: Diagnosis not present

## 2019-09-04 DIAGNOSIS — Z1159 Encounter for screening for other viral diseases: Secondary | ICD-10-CM | POA: Diagnosis not present

## 2019-09-04 DIAGNOSIS — G5603 Carpal tunnel syndrome, bilateral upper limbs: Secondary | ICD-10-CM | POA: Diagnosis not present

## 2019-09-04 DIAGNOSIS — M19011 Primary osteoarthritis, right shoulder: Secondary | ICD-10-CM | POA: Diagnosis not present

## 2019-09-04 DIAGNOSIS — E669 Obesity, unspecified: Secondary | ICD-10-CM | POA: Diagnosis not present

## 2019-09-04 DIAGNOSIS — E1169 Type 2 diabetes mellitus with other specified complication: Secondary | ICD-10-CM | POA: Diagnosis not present

## 2019-09-04 DIAGNOSIS — Z79899 Other long term (current) drug therapy: Secondary | ICD-10-CM | POA: Diagnosis not present

## 2019-09-04 DIAGNOSIS — M19012 Primary osteoarthritis, left shoulder: Secondary | ICD-10-CM | POA: Diagnosis not present

## 2019-09-10 DIAGNOSIS — Z7984 Long term (current) use of oral hypoglycemic drugs: Secondary | ICD-10-CM | POA: Diagnosis not present

## 2019-09-10 DIAGNOSIS — I10 Essential (primary) hypertension: Secondary | ICD-10-CM | POA: Diagnosis not present

## 2019-09-10 DIAGNOSIS — E119 Type 2 diabetes mellitus without complications: Secondary | ICD-10-CM | POA: Diagnosis not present

## 2019-09-10 DIAGNOSIS — E785 Hyperlipidemia, unspecified: Secondary | ICD-10-CM | POA: Diagnosis not present

## 2019-09-18 ENCOUNTER — Other Ambulatory Visit (HOSPITAL_COMMUNITY): Payer: Self-pay | Admitting: Family

## 2019-09-18 DIAGNOSIS — Z1231 Encounter for screening mammogram for malignant neoplasm of breast: Secondary | ICD-10-CM

## 2019-10-09 ENCOUNTER — Other Ambulatory Visit (HOSPITAL_COMMUNITY): Payer: Self-pay | Admitting: Family Medicine

## 2019-10-09 ENCOUNTER — Other Ambulatory Visit (HOSPITAL_COMMUNITY): Payer: Self-pay | Admitting: Family

## 2019-10-09 DIAGNOSIS — M19012 Primary osteoarthritis, left shoulder: Secondary | ICD-10-CM

## 2019-10-09 DIAGNOSIS — M25552 Pain in left hip: Secondary | ICD-10-CM

## 2019-10-09 DIAGNOSIS — M19011 Primary osteoarthritis, right shoulder: Secondary | ICD-10-CM

## 2019-10-09 DIAGNOSIS — Z780 Asymptomatic menopausal state: Secondary | ICD-10-CM

## 2019-10-09 DIAGNOSIS — N644 Mastodynia: Secondary | ICD-10-CM

## 2019-10-09 DIAGNOSIS — M25551 Pain in right hip: Secondary | ICD-10-CM

## 2020-02-10 ENCOUNTER — Other Ambulatory Visit: Payer: Self-pay | Admitting: General Practice

## 2020-02-10 DIAGNOSIS — Z1231 Encounter for screening mammogram for malignant neoplasm of breast: Secondary | ICD-10-CM

## 2020-02-10 DIAGNOSIS — E2839 Other primary ovarian failure: Secondary | ICD-10-CM

## 2020-02-18 ENCOUNTER — Other Ambulatory Visit: Payer: Self-pay | Admitting: Obstetrics and Gynecology

## 2020-02-18 DIAGNOSIS — N644 Mastodynia: Secondary | ICD-10-CM

## 2020-03-18 ENCOUNTER — Ambulatory Visit
Admission: RE | Admit: 2020-03-18 | Discharge: 2020-03-18 | Disposition: A | Discharge status: Home / Self Care | Payer: Medicare HMO | Source: Ambulatory Visit | Attending: Obstetrics and Gynecology | Admitting: Obstetrics and Gynecology

## 2020-03-18 ENCOUNTER — Other Ambulatory Visit: Payer: Self-pay

## 2020-03-18 DIAGNOSIS — N644 Mastodynia: Secondary | ICD-10-CM

## 2020-03-30 ENCOUNTER — Ambulatory Visit: Payer: Medicare HMO | Admitting: Cardiovascular Disease

## 2020-05-01 NOTE — Progress Notes (Signed)
CARDIOLOGY CONSULT NOTE       Patient ID: Samantha [name] MRN: [id number] DOB/AGE: 1945-10-29 74 y.o.  Admit date: (Not on file) Referring Physician: Andree Moro MD  Primary Physician: Lucianne Lei, MD Primary Cardiologist: New  Reason for Consultation: CAD Risk  Active Problems:   * No active hospital problems. *   HPI:  74 y.o. referred by Dr Posey Pronto for CAD risk. History of DM, HTN, HLD and obesity Has CRF with Cr around 1.8 ml/dl.  She has no documented vascular disease. She is somewhat sedentary but walks with some tightness in chest which may be related to dyspnea. Tightness is exertional. 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 She has 2 older children and and grandson that lives with her She is widowed over last 9 years. She does not smoke She has never had stress test No history of TIA, claudication. Baseline Cr is 1.8 from HTN renal disease   ROS All other systems reviewed and negative except as noted above  Past Medical History:  Diagnosis Date  . Allergic rhinitis   . Arthritis of both knees   . Arthritis of both shoulder regions   . Bilateral carpal tunnel syndrome   . Cataract   . Chronic low back pain   . CKD (chronic kidney disease), stage III ([place])   . Decreased vision   . Diabetes mellitus   . HTN (hypertension)   . Hyperlipidemia   . Morbid obesity ([place])     Family History  Problem Relation Age of Onset  . Breast cancer Mother     Social History   Socioeconomic History  . Marital status: Widowed    Spouse name: Not on file  . Number of children: Not on file  . Years of education: Not on file  . Highest education level: Not on file  Occupational History  . Not on file  Tobacco Use  . Smoking status: Never Smoker  . Smokeless tobacco: Never Used  Substance and Sexual Activity  . Alcohol use: No  . Drug use: No  . Sexual activity: Yes    Birth control/protection: Surgical  Other Topics Concern  . Not on file    Social History Narrative  . Not on file   Social Determinants of Health   Financial Resource Strain:   . Difficulty of Paying Living Expenses: Not on file  Food Insecurity:   . Worried About Charity fundraiser in the Last Year: Not on file  . Ran Out of Food in the Last Year: Not on file  Transportation Needs:   . Lack of Transportation (Medical): Not on file  . Lack of Transportation (Non-Medical): Not on file  Physical Activity:   . Days of Exercise per Week: Not on file  . Minutes of Exercise per Session: Not on file  Stress:   . Feeling of Stress : Not on file  Social Connections:   . Frequency of Communication with Friends and Family: Not on file  . Frequency of Social Gatherings with Friends and Family: Not on file  . Attends Religious Services: Not on file  . Active Member of Clubs or Organizations: Not on file  . Attends Archivist Meetings: Not on file  . Marital Status: Not on file  Intimate Partner Violence:   . Fear of Current or Ex-Partner: Not on file  . Emotionally Abused: Not on file  . Physically Abused: Not on file  . Sexually  Abused: Not on file    Past Surgical History:  Procedure Laterality Date  . CATARACT EXTRACTION    . TUBAL LIGATION        Current Outpatient Medications:  .  acetaminophen (TYLENOL) 500 MG tablet, Take 1,000 mg by mouth every 6 (six) hours as needed for mild pain., Disp: , Rfl:  .  amLODipine (NORVASC) 5 MG tablet, Take 5 mg by mouth 2 (two) times daily., Disp: , Rfl:  .  aspirin EC 81 MG tablet, Take 81 mg by mouth daily., Disp: , Rfl:  .  atorvastatin (LIPITOR) 40 MG tablet, Take 40 mg by mouth daily., Disp: , Rfl:  .  cloNIDine (CATAPRES) 0.1 MG tablet, Take 0.1 mg by mouth daily. , Disp: , Rfl:  .  Cyanocobalamin ([employer]S B-12) 1500 MCG TBDP, Take 1 tablet by mouth daily., Disp: , Rfl:  .  cyclobenzaprine (FLEXERIL) 5 MG tablet, Take 1 tablet (5 mg total) by mouth 3 (three) times daily as needed for muscle spasms.,  Disp: 15 tablet, Rfl: 0 .  diclofenac Sodium (VOLTAREN) 1 % GEL, Apply topically 4 (four) times daily., Disp: , Rfl:  .  empagliflozin (JARDIANCE) 10 MG TABS tablet, Take by mouth daily., Disp: , Rfl:  .  ezetimibe (ZETIA) 10 MG tablet, Take 10 mg by mouth daily., Disp: , Rfl:  .  FLUTICASONE PROPIONATE, NASAL, NA, Place into the nose., Disp: , Rfl:  .  glipiZIDE (GLUCOTROL) 5 MG tablet, Take 5 mg by mouth daily before breakfast., Disp: , Rfl:  .  Homeopathic Products (CVS LEG CRAMPS PAIN RELIEF PO), Take 1 tablet by mouth daily as needed (for cramping). , Disp: , Rfl:  .  linagliptin (TRADJENTA) 5 MG TABS tablet, Take 5 mg by mouth daily. , Disp: , Rfl:  .  lisinopril-hydrochlorothiazide (PRINZIDE,ZESTORETIC) 20-12.5 MG per tablet, Take 2 tablets by mouth daily., Disp: , Rfl:  .  meclizine (ANTIVERT) 25 MG tablet, Take 25 mg by mouth 3 (three) times daily as needed for dizziness., Disp: , Rfl:  .  metFORMIN (GLUCOPHAGE) 500 MG tablet, Take 500 mg by mouth 2 (two) times daily with a meal., Disp: , Rfl:  .  potassium chloride SA (K-DUR,KLOR-CON) 20 MEQ tablet, Take 20 mEq by mouth daily., Disp: , Rfl:     Physical Exam: There were no vitals taken for this visit.    Affect appropriate Obese black female  Poor vision  Lungs clear with no wheezing and good diaphragmatic motion Heart:  S1/S2 no murmur, no rub, gallop or click PMI normal Abdomen: benighn, BS positve, no tenderness, no AAA no bruit.  No HSM or HJR Distal pulses intact with no bruits No edema Neuro non-focal Skin warm and dry No muscular weakness  Labs:   Lab Results  Component Value Date   WBC 7.6 05/08/2018   HGB 11.0 (L) 05/08/2018   HCT 37.7 05/08/2018   MCV 84.7 05/08/2018   PLT 270 05/08/2018   No results for input(s): NA, K, CL, CO2, BUN, CREATININE, CALCIUM, PROT, BILITOT, ALKPHOS, ALT, AST, GLUCOSE in the last 168 hours.  Invalid input(s): LABALBU Lab Results  Component Value Date   TROPONINI <0.03  09/06/2015    Lab Results  Component Value Date   CHOL 166 02/09/2009   CHOL 196 09/29/2008   CHOL 185 01/16/2008   Lab Results  Component Value Date   HDL 60 02/09/2009   HDL 74 09/29/2008   HDL 75 01/16/2008   Lab Results  Component Value  Date   LDLCALC 89 02/09/2009   LDLCALC 100 (H) 09/29/2008   LDLCALC 100 (H) 01/16/2008   Lab Results  Component Value Date   TRIG 86 02/09/2009   TRIG 111 09/29/2008   TRIG 52 01/16/2008   Lab Results  Component Value Date   CHOLHDL 2.8 Ratio 02/09/2009   CHOLHDL 2.6 Ratio 09/29/2008   CHOLHDL 2.5 Ratio 01/16/2008   No results found for: LDLDIRECT    Radiology: No results found.  EKG: SR rate 78 normal    ASSESSMENT AND PLAN:   1. HTN:  Well controlled.  Continue current medications and low sodium Dash type diet.   2. DM:  Discussed low carb diet.  Target hemoglobin A1c is 6.5 or less.  Continue current medications. 3. HLD: continue statin labs with primary  4. CKD:  Baseline CR 1.8 f/u primary  5. CAD risk with some tightness in chest with exertion f/u exercise myovue ordered. Discussed utility of coronary Calcium score as well but she does not care for out of pocket expense   F/U PRN if myovue normal    Signed: Jenkins Rouge 05/01/2020, 2:21 PM

## 2020-05-12 ENCOUNTER — Ambulatory Visit: Payer: Medicare HMO | Admitting: Cardiovascular Disease

## 2020-05-12 ENCOUNTER — Encounter: Payer: Self-pay | Admitting: Cardiovascular Disease

## 2020-05-12 ENCOUNTER — Other Ambulatory Visit: Payer: Self-pay

## 2020-05-12 VITALS — BP 122/74 | HR 78 | Ht 61.0 in | Wt 207.2 lb

## 2020-05-12 DIAGNOSIS — N183 Chronic kidney disease, stage 3 unspecified: Secondary | ICD-10-CM | POA: Diagnosis not present

## 2020-05-12 DIAGNOSIS — R072 Precordial pain: Secondary | ICD-10-CM | POA: Diagnosis not present

## 2020-05-12 DIAGNOSIS — R079 Chest pain, unspecified: Secondary | ICD-10-CM

## 2020-05-12 DIAGNOSIS — I10 Essential (primary) hypertension: Secondary | ICD-10-CM | POA: Diagnosis not present

## 2020-05-12 DIAGNOSIS — E118 Type 2 diabetes mellitus with unspecified complications: Secondary | ICD-10-CM

## 2020-05-12 NOTE — Patient Instructions (Signed)
Medication Instructions:  *If you need a refill on your cardiac medications before your next appointment, please call your pharmacy*  Lab Work: If you have labs (blood work) drawn today and your tests are completely normal, you will receive your results only by:  [place] (if you have MyChart) OR  A paper copy in the mail If you have any lab test that is abnormal or we need to change your treatment, we will call you to review the results.  Testing/Procedures: Your physician has requested that you have en exercise stress myoview. For further information please visit [email or web address]. Please follow instruction sheet, as given.  Follow-Up: At [facility], you and your health needs are our priority.  As part of our continuing mission to provide you with exceptional heart care, we have created designated Provider Care Teams.  These Care Teams include your primary Cardiologist (physician) and Advanced Practice Providers (APPs -  Physician Assistants and Nurse Practitioners) who all work together to provide you with the care you need, when you need it.  We recommend signing up for the patient portal called "MyChart".  Sign up information is provided on this After Visit Summary.  MyChart is used to connect with patients for Virtual Visits (Telemedicine).  Patients are able to view lab/test results, encounter notes, upcoming appointments, etc.  Non-urgent messages can be sent to your provider as well.   To learn more about what you can do with MyChart, go to [email or web address].    Your next appointment:   As needed  The format for your next appointment:   In Person  Provider:   You may see Dr. Johnsie Cancel or one of the following Advanced Practice Providers on your designated Care Team:    Truitt Merle, NP  Cecilie Kicks, NP  Kathyrn Drown, NP

## 2020-05-24 ENCOUNTER — Other Ambulatory Visit (HOSPITAL_COMMUNITY)
Admission: RE | Admit: 2020-05-24 | Discharge: 2020-05-24 | Disposition: A | Discharge status: Home / Self Care | Payer: Medicare HMO | Source: Ambulatory Visit | Attending: Cardiovascular Disease | Admitting: Cardiovascular Disease

## 2020-05-24 ENCOUNTER — Telehealth (HOSPITAL_COMMUNITY): Payer: Self-pay

## 2020-05-24 ENCOUNTER — Ambulatory Visit
Admission: RE | Admit: 2020-05-24 | Discharge: 2020-05-24 | Disposition: A | Discharge status: Home / Self Care | Payer: Medicare HMO | Source: Ambulatory Visit | Attending: General Practice | Admitting: General Practice

## 2020-05-24 ENCOUNTER — Other Ambulatory Visit: Payer: Self-pay

## 2020-05-24 DIAGNOSIS — E2839 Other primary ovarian failure: Secondary | ICD-10-CM

## 2020-05-24 DIAGNOSIS — Z20822 Contact with and (suspected) exposure to covid-19: Secondary | ICD-10-CM | POA: Insufficient documentation

## 2020-05-24 DIAGNOSIS — Z01812 Encounter for preprocedural laboratory examination: Secondary | ICD-10-CM | POA: Diagnosis present

## 2020-05-24 LAB — SARS CORONAVIRUS 2 (TAT 6-24 HRS): SARS Coronavirus 2: NEGATIVE

## 2020-05-24 NOTE — Telephone Encounter (Signed)
Detailed instructions left on the patient's answering machine. Asked to call back with any questions. S.Mionna Advincula EMTP 

## 2020-05-25 ENCOUNTER — Telehealth: Payer: Self-pay | Admitting: Cardiovascular Disease

## 2020-05-25 NOTE — Telephone Encounter (Signed)
Called number looks like Dr. Uvaldo Bristle is with the insurance company calling to talk to Dr. Johnsie Cancel about patient having exercise myoview. Will forward to Dr. Johnsie Cancel.

## 2020-05-25 NOTE — Telephone Encounter (Signed)
Cora from Cardiovascular consults ( Dr Uvaldo Bristle) called in for a Samantha [name] to [facility] with Dr Johnsie Cancel    Number is [phone]

## 2020-05-27 ENCOUNTER — Other Ambulatory Visit: Payer: Self-pay

## 2020-05-27 ENCOUNTER — Ambulatory Visit (HOSPITAL_COMMUNITY): Payer: Medicare HMO | Attending: Cardiovascular Disease

## 2020-05-27 DIAGNOSIS — R079 Chest pain, unspecified: Secondary | ICD-10-CM | POA: Diagnosis present

## 2020-05-27 DIAGNOSIS — R072 Precordial pain: Secondary | ICD-10-CM | POA: Diagnosis present

## 2020-05-27 LAB — MYOCARDIAL PERFUSION IMAGING
Estimated workload: 4.6 METS
Exercise duration (min): 4 min
Exercise duration (sec): 0 s
LV dias vol: 56 mL (ref 46–106)
LV sys vol: 15 mL
MPHR: 147 {beats}/min
Peak HR: 162 {beats}/min
Percent HR: 110 %
Rest HR: 86 {beats}/min
SDS: 0
SRS: 0
SSS: 0
TID: 0.88

## 2020-05-27 MED ORDER — TECHNETIUM TC 99M TETROFOSMIN IV KIT
10.3000 | PACK | Freq: Once | INTRAVENOUS | Status: AC | PRN
  Administered 2020-05-27: 10.3 via INTRAVENOUS
  Filled 2020-05-27: qty 11

## 2020-05-27 MED ORDER — TECHNETIUM TC 99M TETROFOSMIN IV KIT
32.4000 | PACK | Freq: Once | INTRAVENOUS | Status: AC | PRN
  Administered 2020-05-27: 32.4 via INTRAVENOUS
  Filled 2020-05-27: qty 33

## 2020-06-18 ENCOUNTER — Other Ambulatory Visit: Payer: Self-pay | Admitting: General Practice

## 2020-06-18 DIAGNOSIS — N183 Chronic kidney disease, stage 3 unspecified: Secondary | ICD-10-CM

## 2020-07-08 ENCOUNTER — Encounter: Payer: Self-pay | Admitting: Internal Medicine

## 2020-07-28 ENCOUNTER — Encounter: Payer: Self-pay | Admitting: *Deleted

## 2020-07-28 ENCOUNTER — Other Ambulatory Visit: Payer: Self-pay

## 2020-07-28 ENCOUNTER — Ambulatory Visit (INDEPENDENT_AMBULATORY_CARE_PROVIDER_SITE_OTHER): Payer: Self-pay | Admitting: *Deleted

## 2020-07-28 VITALS — Ht 61.0 in | Wt 208.0 lb

## 2020-07-28 DIAGNOSIS — Z860100 Personal history of colon polyps, unspecified: Secondary | ICD-10-CM

## 2020-07-28 MED ORDER — PEG 3350-KCL-NA BICARB-NACL 420 G PO SOLR: 4000.0000 mL | Freq: Once | ORAL | 0 refills | Status: AC

## 2020-07-28 NOTE — Patient Instructions (Signed)
Samantha [name]   02-Jan-1946 MRN: [id number] Procedure Date: 09/15/2020 Arrival Time:   You will receive a call from the hospital a few days before your procedure.     Location of Procedure: [facility]  PREPARATION FOR COLONOSCOPY WITH TRI-LYTE PREP  Please notify us immediately if you are diabetic, take iron supplements, or if you are on Coumadin or any other blood thinners.   Please hold the following medications: See letter   PROCEDURE IS SCHEDULED FOR Ashley Murrain AS FOLLOWS:  Procedure Date: 09/15/2020 Time to register: You will receive a call from the hospital a few days before your procedure. Place to register: [facility] Scheduled provider: Dr. Gala Romney   2 DAYS BEFORE PROCEDURE:  DATE: 09/13/2020   DAY: Monday Begin clear liquid diet AFTER your lunch meal. NO SOLID FOODS!   1 DAY BEFORE PROCEDURE:  DATE: 09/14/2020   DAY: Tuesday  Continue clear liquids the entire day - NO SOLID FOOD.   Diabetic medications adjustments for today: See letter  At 12:00pm (noon): Take 2 (two) Dulcolax (Bisacodyl) tablets  At 2:00pm: Start drinking your solution. Try to drink 1 (one) 8 ounce glass every 10-15 minutes, until you have consumed HALF the jug. (You should complete the first 1/2 of the jug in 2 hours. Wait 30 minutes, then drink 3-4 more glasses of the solution. Your stools should be clear; if not, you may have to consume the rest of the jug.   One hour after completing the solution: take the last 2 (two) Dulcolax (Bisacodyl) tablets, with a clear liquid.  YOU MUST DRINK PLENTY OF CLEAR LIQUIDS DURING YOUR PREP TO REDUCE RISKS OF KIDNEY FAILURE.   Continue clear liquids only, until midnight. Do not eat or drink anything after midnight.  EXCEPTION:  If you take medications for your heart, blood pressure or breathing, you may take these medications with a small amount of clear liquid.      DAY OF PROCEDURE:   DATE: 09/15/2020      DAY: Wednesday The morning of your  procedure give yourself 1 (one) Fleet Enema, at least 1 hour before going to the hospital.   Diabetic medications adjustments for today. See letter.  Someone MUST be available to drive you home; the hospital will cancel this appointment if you do not have a driver.   Please call the office if you have any questions (Dept: [phone]).  Please see below for Dietary Information.  CLEAR LIQUIDS INCLUDE:  Water Jello (NOT red in color)   Ice Popsicles (NOT red in color)   Tea (sugar ok, no milk/cream) Powdered fruit flavored drinks  Coffee (sugar ok, no milk/cream) Gatorade/ Lemonade/ Kool-Aid  (NOT red in color)   Juice: apple, white grape, white cranberry Soft drinks  Clear bullion, consomme, broth (fat free beef/chicken/vegetable)  Carbonated beverages (any kind)  Strained chicken noodle soup Hard Candy   REMEMBER: Clear liquids are liquids that will allow you to see your fingers on the other side of a clear glass. Be sure liquids are NOT red in color, and not cloudy, but CLEAR.   DO NOT EAT OR DRINK ANY OF THE FOLLOWING:  Dairy products of any kind   Cranberry juice Tomato juice / V8 juice   Grapefruit juice Orange juice     Red grape juice  Do not eat any solid foods, including such foods as: cereal, oatmeal, yogurt, fruits, vegetables, creamed soups, eggs, bread, etc.    HELPFUL HINTS FOR  DRINKING PREP SOLUTION:   Make sure prep is extremely cold. Refrigerate the night before. You may also put in the freezer.   You may try mixing some Crystal Light or Country Time Lemonade if you prefer. Mix in small amounts; add more if necessary.  Try drinking through a straw  Rinse mouth with water or a mouthwash between glasses, to remove after-taste.  Try sipping on a cold beverage /ice/ popsicles between glasses of prep  Place a piece of sugar-free hard candy in mouth between glasses  If you become nauseated, try consuming smaller amounts, or stretch out the time between  glasses. Stop for 30-60 minutes, then slowly start back drinking    You may call the office (Dept: [phone]) before 5:00pm, or page the doctor on call after 5:00pm ([phone]), for further instructions, if necessary.   OTHER INSTRUCTIONS  You will need a responsible adult at least 74 years of age to accompany you and drive you home. This person must remain in the waiting room during your procedure.  Wear loose fitting clothing that is easily removed.  Leave jewelry and other valuables at home.   Remove all body piercing jewelry and leave at home.  Total time from sign-in until discharge is approximately 2-3 hours.  You should go home directly after your procedure and rest. You can resume normal activities the day after your procedure.  The day of your procedure you should not:  Drive  Make legal decisions  Operate machinery  Drink alcohol  Return to work

## 2020-07-28 NOTE — Progress Notes (Signed)
Mailed letter to pt with diabetes medication adjustments.   

## 2020-07-28 NOTE — Progress Notes (Addendum)
Gastroenterology Pre-Procedure Review  Request Date: 07/28/2020 Requesting Physician: Dr. Andree Moro @ [address] [facility], 7 year repeat (overdue), Last TCS 01/20/2010 done by Dr. Gala Romney, tubular adenoma, hyperplastic polyp  PATIENT REVIEW QUESTIONS: The patient responded to the following health history questions as indicated:    1. Diabetes Melitis: yes, type II 2. Joint replacements in the past 12 months: no 3. Major health problems in the past 3 months: no 4. Has an artificial valve or MVP: no 5. Has a defibrillator: no 6. Has been advised in past to take antibiotics in advance of a procedure like teeth cleaning: no 7. Family history of colon cancer: no  8. Alcohol Use: no 9. Illicit drug Use: no 10. History of sleep apnea: no  11. History of coronary artery or other vascular stents placed within the last 12 months: no 12. History of any prior anesthesia complications: no 13. Body mass index is 39.3 kg/m.    MEDICATIONS & ALLERGIES:    Patient reports the following regarding taking any blood thinners:   Plavix? no Aspirin? yes Coumadin? no Brilinta? no Xarelto? no Eliquis? no Pradaxa? no Savaysa? no Effient? no  Patient confirms/reports the following medications:  Current Outpatient Medications  Medication Sig Dispense Refill  . acetaminophen (TYLENOL) 500 MG tablet Take 1,000 mg by mouth as needed for mild pain.    [name] Kitchen amLODipine (NORVASC) 5 MG tablet Take 5 mg by mouth 2 (two) times daily.    [name] Kitchen aspirin EC 81 MG tablet Take 81 mg by mouth daily.    [name] Kitchen atorvastatin (LIPITOR) 40 MG tablet Take 40 mg by mouth daily.    . cloNIDine (CATAPRES) 0.1 MG tablet Take 0.1 mg by mouth daily.     [name] Kitchen FLUTICASONE PROPIONATE, NASAL, NA Place into the nose.    . Homeopathic Products ([employer] LEG CRAMPS PAIN RELIEF PO) Take 1 tablet by mouth daily as needed (for cramping).     . JARDIANCE 10 MG TABS tablet daily.    [name] Kitchen lisinopril (ZESTRIL) 10 MG tablet Take 10 mg by mouth daily.    .  meclizine (ANTIVERT) 25 MG tablet Take 25 mg by mouth as needed for dizziness.    . metFORMIN (GLUCOPHAGE) 1000 MG tablet daily.     No current facility-administered medications for this visit.    Patient confirms/reports the following allergies:  No Known Allergies  No orders of the defined types were placed in this encounter.   AUTHORIZATION INFORMATION Primary Insurance: [place],  ID #: [id number] Pre-Cert / Josem Kaufmann required: Approved per Rodena Piety 07/21/9240-11/17/3417 Pre-Cert / Josem Kaufmann #: [id number]  SCHEDULE INFORMATION: Procedure has been scheduled as follows:  Date: 09/15/2020, Time: PM procedure Location: [facility] with Dr. Gala Romney  This Gastroenterology Pre-Precedure Review Form is being routed to the following provider(s): Roseanne Kaufman, NP

## 2020-07-28 NOTE — Progress Notes (Signed)
ASA 2. No metformin or jardiance day of procedure.

## 2020-08-04 ENCOUNTER — Other Ambulatory Visit: Payer: Self-pay | Admitting: *Deleted

## 2020-09-13 ENCOUNTER — Other Ambulatory Visit (HOSPITAL_COMMUNITY)
Admission: RE | Admit: 2020-09-13 | Discharge: 2020-09-13 | Disposition: A | Discharge status: Home / Self Care | Payer: Medicare HMO | Source: Ambulatory Visit | Attending: Internal Medicine | Admitting: Internal Medicine

## 2020-09-13 ENCOUNTER — Other Ambulatory Visit: Payer: Self-pay

## 2020-09-13 DIAGNOSIS — Z01812 Encounter for preprocedural laboratory examination: Secondary | ICD-10-CM | POA: Insufficient documentation

## 2020-09-13 DIAGNOSIS — Z20822 Contact with and (suspected) exposure to covid-19: Secondary | ICD-10-CM | POA: Diagnosis not present

## 2020-09-14 ENCOUNTER — Telehealth: Payer: Self-pay | Admitting: *Deleted

## 2020-09-14 LAB — SARS CORONAVIRUS 2 (TAT 6-24 HRS): SARS Coronavirus 2: NEGATIVE

## 2020-09-14 NOTE — Telephone Encounter (Signed)
Pt called to verify her instructions with me.  She misplaced them.  Went over them thoroughly.  She informed me that she stopped all food after midnight last night.  Discussed with Samantha [name] and pt ok to still have done.  She is going to start clear liquid diet now.  She is calling [facility] to verify what time she needs to be there for procedure.

## 2020-09-15 ENCOUNTER — Other Ambulatory Visit: Payer: Self-pay

## 2020-09-15 ENCOUNTER — Encounter (HOSPITAL_COMMUNITY): Payer: Self-pay | Admitting: Internal Medicine

## 2020-09-15 ENCOUNTER — Ambulatory Visit (HOSPITAL_COMMUNITY)
Admission: RE | Admit: 2020-09-15 | Discharge: 2020-09-15 | Disposition: A | Discharge status: Home / Self Care | Payer: Medicare HMO | Attending: Internal Medicine | Admitting: Internal Medicine

## 2020-09-15 ENCOUNTER — Encounter (HOSPITAL_COMMUNITY)
Admission: RE | Disposition: A | Discharge status: Home / Self Care | Payer: Self-pay | Source: Home / Self Care | Attending: Internal Medicine

## 2020-09-15 DIAGNOSIS — K5730 Diverticulosis of large intestine without perforation or abscess without bleeding: Secondary | ICD-10-CM | POA: Diagnosis not present

## 2020-09-15 DIAGNOSIS — Z1211 Encounter for screening for malignant neoplasm of colon: Secondary | ICD-10-CM | POA: Insufficient documentation

## 2020-09-15 DIAGNOSIS — Z86010 Personal history of colonic polyps: Secondary | ICD-10-CM

## 2020-09-15 DIAGNOSIS — K5140 Inflammatory polyps of colon without complications: Secondary | ICD-10-CM | POA: Insufficient documentation

## 2020-09-15 DIAGNOSIS — Z803 Family history of malignant neoplasm of breast: Secondary | ICD-10-CM | POA: Diagnosis not present

## 2020-09-15 DIAGNOSIS — K635 Polyp of colon: Secondary | ICD-10-CM | POA: Insufficient documentation

## 2020-09-15 DIAGNOSIS — Z79899 Other long term (current) drug therapy: Secondary | ICD-10-CM | POA: Diagnosis not present

## 2020-09-15 DIAGNOSIS — Z7982 Long term (current) use of aspirin: Secondary | ICD-10-CM | POA: Diagnosis not present

## 2020-09-15 DIAGNOSIS — Z7984 Long term (current) use of oral hypoglycemic drugs: Secondary | ICD-10-CM | POA: Insufficient documentation

## 2020-09-15 HISTORY — PX: POLYPECTOMY: SHX5525

## 2020-09-15 HISTORY — PX: COLONOSCOPY: SHX5424

## 2020-09-15 LAB — GLUCOSE, CAPILLARY: Glucose-Capillary: 141 mg/dL — ABNORMAL HIGH (ref 70–99)

## 2020-09-15 SURGERY — COLONOSCOPY
Anesthesia: Moderate Sedation

## 2020-09-15 MED ORDER — SODIUM CHLORIDE 0.9 % IV SOLN: INTRAVENOUS | Status: DC

## 2020-09-15 MED ORDER — MIDAZOLAM HCL 5 MG/5ML IJ SOLN
INTRAMUSCULAR | Status: DC | PRN
  Administered 2020-09-15: 1 mg via INTRAVENOUS
  Administered 2020-09-15 (×2): 2 mg via INTRAVENOUS

## 2020-09-15 MED ORDER — ONDANSETRON HCL 4 MG/2ML IJ SOLN
INTRAMUSCULAR | Status: AC
  Filled 2020-09-15: qty 2

## 2020-09-15 MED ORDER — MIDAZOLAM HCL 5 MG/5ML IJ SOLN
INTRAMUSCULAR | Status: AC
  Filled 2020-09-15: qty 10

## 2020-09-15 MED ORDER — MEPERIDINE HCL 50 MG/ML IJ SOLN
INTRAMUSCULAR | Status: AC
  Filled 2020-09-15: qty 1

## 2020-09-15 MED ORDER — MEPERIDINE HCL 100 MG/ML IJ SOLN
INTRAMUSCULAR | Status: DC | PRN
  Administered 2020-09-15: 25 mg
  Administered 2020-09-15: 10 mg

## 2020-09-15 MED ORDER — ONDANSETRON HCL 4 MG/2ML IJ SOLN
INTRAMUSCULAR | Status: DC | PRN
  Administered 2020-09-15: 4 mg via INTRAVENOUS

## 2020-09-15 MED ORDER — STERILE WATER FOR IRRIGATION IR SOLN
Status: DC | PRN
  Administered 2020-09-15: 100 mL

## 2020-09-15 NOTE — Op Note (Signed)
[facility] Patient Name: Samantha [name] Procedure Date: 09/15/2020 1:29 PM MRN: [id number] Date of Birth: 1946/01/26 Attending MD: Norvel Richards , MD CSN: [id number] Age: 75 Admit Type: Outpatient Procedure:                Colonoscopy Indications:              High risk colon cancer surveillance: Personal                            history of colonic polyps Providers:                Norvel Richards, MD, [place] Page, [place]                            Risa Grill, Technician Referring MD:              Medicines:                Midazolam 5 mg IV, Meperidine 35 mg IV Complications:            No immediate complications. Estimated Blood Loss:     Estimated blood loss was minimal. Procedure:                Pre-Anesthesia Assessment:                           - Prior to the procedure, a History and Physical                            was performed, and patient medications and                            allergies were reviewed. The patient's tolerance of                            previous anesthesia was also reviewed. The risks                            and benefits of the procedure and the sedation                            options and risks were discussed with the patient.                            All questions were answered, and informed consent                            was obtained. Prior Anticoagulants: The patient has                            taken no previous anticoagulant or antiplatelet                            agents. ASA Grade Assessment: II - A patient with  mild systemic disease. After reviewing the risks                            and benefits, the patient was deemed in                            satisfactory condition to undergo the procedure.                           After obtaining informed consent, the colonoscope                            was passed under direct vision. Throughout the                             procedure, the patient's blood pressure, pulse, and                            oxygen saturations were monitored continuously. The                            CF-HQ190L ([id number]) scope was introduced through                            the anus and advanced to the the cecum, identified                            by appendiceal orifice and ileocecal valve. The                            colonoscopy was performed without difficulty. The                            patient tolerated the procedure well. The quality                            of the bowel preparation was adequate. Scope In: 1:50:13 PM Scope Out: 2:08:39 PM Scope Withdrawal Time: 0 hours 12 minutes 7 seconds  Total Procedure Duration: 0 hours 18 minutes 26 seconds  Findings:      The perianal and digital rectal examinations were normal.      Scattered small and large-mouthed diverticula were found in the entire       colon.      Three semi-pedunculated polyps were found in the sigmoid colon and       splenic flexure. The polyps were 3 to 5 mm in size. These polyps were       removed with a cold snare. Resection and retrieval were complete.       Estimated blood loss was minimal.      The exam was otherwise without abnormality on direct and retroflexion       views. Impression:               - Diverticulosis in the entire examined colon.                           -  Three 3 to 5 mm polyps in the sigmoid colon and                            at the splenic flexure, removed with a cold snare.                            Resected and retrieved.                           - The examination was otherwise normal on direct                            and retroflexion views. Moderate Sedation:      Moderate (conscious) sedation was administered by the endoscopy nurse       and supervised by the endoscopist. The following parameters were       monitored: oxygen saturation, heart rate, blood pressure, respiratory       rate, EKG, adequacy of  pulmonary ventilation, and response to care.       Total physician intraservice time was 23 minutes. Recommendation:           - Patient has a contact number available for                            emergencies. The signs and symptoms of potential                            delayed complications were discussed with the                            patient. Return to normal activities tomorrow.                            Written discharge instructions were provided to the                            patient.                           - Resume previous diet.                           - Repeat colonoscopy date to be determined after                            pending pathology results are reviewed for                            surveillance based on pathology results.                           - Return to GI office (date not yet determined). Procedure Code(s):        --- Professional ---                           [phone],  Colonoscopy, flexible; with removal of                            tumor(s), polyp(s), or other lesion(s) by snare                            technique                           99153, Moderate sedation; each additional 15                            minutes intraservice time                           G0500, Moderate sedation services provided by the                            same physician or other qualified health care                            professional performing a gastrointestinal                            endoscopic service that sedation supports,                            requiring the presence of an independent trained                            observer to assist in the monitoring of the                            patient's level of consciousness and physiological                            status; initial 15 minutes of intra-service time;                            patient age 42 years or older (additional time may                            be reported with [phone],  as appropriate) Diagnosis Code(s):        --- Professional ---                           Z86.010, Personal history of colonic polyps                           K63.5, Polyp of colon                           K57.30, Diverticulosis of large intestine without                            perforation or  abscess without bleeding CPT copyright 2019 [employer]. All rights reserved. The codes documented in this report are preliminary and upon coder review may  be revised to meet current compliance requirements. Cristopher Estimable. Cornie Mccomber, MD Norvel Richards, MD 09/15/2020 2:16:18 PM This report has been signed electronically. Number of Addenda: 0

## 2020-09-15 NOTE — Discharge Instructions (Signed)
Diverticulosis  Diverticulosis is a condition that develops when small pouches (diverticula) form in the wall of the large intestine (colon). The colon is where water is absorbed and stool (feces) is formed. The pouches form when the inside layer of the colon pushes through weak spots in the outer layers of the colon. You may have a few pouches or many of them. The pouches usually do not cause problems unless they become inflamed or infected. When this happens, the condition is called diverticulitis. What are the causes? The cause of this condition is not known. What increases the risk? The following factors may make you more likely to develop this condition:  Being older than age 60. Your risk for this condition increases with age. Diverticulosis is rare among people younger than age 30. By age 80, many people have it.  Eating a low-fiber diet.  Having frequent constipation.  Being overweight.  Not getting enough exercise.  Smoking.  Taking over-the-counter pain medicines, like aspirin and ibuprofen.  Having a family history of diverticulosis. What are the signs or symptoms? In most people, there are no symptoms of this condition. If you do have symptoms, they may include:  Bloating.  Cramps in the abdomen.  Constipation or diarrhea.  Pain in the lower left side of the abdomen. How is this diagnosed? Because diverticulosis usually has no symptoms, it is most often diagnosed during an exam for other colon problems. The condition may be diagnosed by:  Using a flexible scope to examine the colon (colonoscopy).  Taking an X-ray of the colon after dye has been put into the colon (barium enema).  Having a CT scan. How is this treated? You may not need treatment for this condition. Your health care provider may recommend treatment to prevent problems. You may need treatment if you have symptoms or if you previously had diverticulitis. Treatment may include:  Eating a high-fiber  diet.  Taking a fiber supplement.  Taking a live bacteria supplement (probiotic).  Taking medicine to relax your colon.   Follow these instructions at home: Medicines  Take over-the-counter and prescription medicines only as told by your health care provider.  If told by your health care provider, take a fiber supplement or probiotic. Constipation prevention Your condition may cause constipation. To prevent or treat constipation, you may need to:  Drink enough fluid to keep your urine pale yellow.  Take over-the-counter or prescription medicines.  Eat foods that are high in fiber, such as beans, whole grains, and fresh fruits and vegetables.  Limit foods that are high in fat and processed sugars, such as fried or sweet foods.   General instructions  Try not to strain when you have a bowel movement.  Keep all follow-up visits as told by your health care provider. This is important. Contact a health care provider if you:  Have pain in your abdomen.  Have bloating.  Have cramps.  Have not had a bowel movement in 3 days. Get help right away if:  Your pain gets worse.  Your bloating becomes very bad.  You have a fever or chills, and your symptoms suddenly get worse.  You vomit.  You have bowel movements that are bloody or black.  You have bleeding from your rectum. Summary  Diverticulosis is a condition that develops when small pouches (diverticula) form in the wall of the large intestine (colon).  You may have a few pouches or many of them.  This condition is most often diagnosed during an exam   for other colon problems.  Treatment may include increasing the fiber in your diet, taking supplements, or taking medicines. This information is not intended to replace advice given to you by your health care provider. Make sure you discuss any questions you have with your health care provider. Document Revised: 12/26/2018 Document Reviewed: 12/26/2018 Elsevier Patient  Education  [place]. Colon Polyps  Colon polyps are tissue growths inside the colon, which is part of the large intestine. They are one of the types of polyps that can grow in the body. A polyp may be a round bump or a mushroom-shaped growth. You could have one polyp or more than one. Most colon polyps are noncancerous (benign). However, some colon polyps can become cancerous over time. Finding and removing the polyps early can help prevent this. What are the causes? The exact cause of colon polyps is not known. What increases the risk? The following factors may make you more likely to develop this condition:  Having a family history of colorectal cancer or colon polyps.  Being older than 74 years of age.  Being younger than 74 years of age and having a significant family history of colorectal cancer or colon polyps or a genetic condition that puts you at higher risk of getting colon polyps.  Having inflammatory bowel disease, such as ulcerative colitis or Crohn's disease.  Having certain conditions passed from parent to child (hereditary conditions), such as: ? Familial adenomatous polyposis (FAP). ? Lynch syndrome. ? Turcot syndrome. ? Peutz-Jeghers syndrome. ? MUTYH-associated polyposis (MAP).  Being overweight.  Certain lifestyle factors. These include smoking cigarettes, drinking too much alcohol, not getting enough exercise, and eating a diet that is high in fat and red meat and low in fiber.  Having had childhood cancer that was treated with radiation of the abdomen. What are the signs or symptoms? Many times, there are no symptoms. If you have symptoms, they may include:  Blood coming from the rectum during a bowel movement.  Blood in the stool (feces). The blood may be bright red or very dark in color.  Pain in the abdomen.  A change in bowel habits, such as constipation or diarrhea. How is this diagnosed? This condition is diagnosed with a colonoscopy.  This is a procedure in which a lighted, flexible scope is inserted into the opening between the buttocks (anus) and then passed into the colon to examine the area. Polyps are sometimes found when a colonoscopy is done as part of routine cancer screening tests. How is this treated? This condition is treated by removing any polyps that are found. Most polyps can be removed during a colonoscopy. Those polyps will then be tested for cancer. Additional treatment may be needed depending on the results of testing. Follow these instructions at home: Eating and drinking  Eat foods that are high in fiber, such as fruits, vegetables, and whole grains.  Eat foods that are high in calcium and vitamin D, such as milk, cheese, yogurt, eggs, liver, fish, and broccoli.  Limit foods that are high in fat, such as fried foods and desserts.  Limit the amount of red meat, precooked or cured meat, or other processed meat that you eat, such as hot dogs, sausages, bacon, or meat loaves.  Limit sugary drinks.   Lifestyle  Maintain a healthy weight, or lose weight if recommended by your health care provider.  Exercise every day or as told by your health care provider.  Do not use any products that contain  nicotine or tobacco, such as cigarettes, e-cigarettes, and chewing tobacco. If you need help quitting, ask your health care provider.  Do not drink alcohol if: ? Your health care provider tells you not to drink. ? You are pregnant, may be pregnant, or are planning to become pregnant.  If you drink alcohol: ? Limit how much you use to:  0-1 drink a day for women.  0-2 drinks a day for men. ? Know how much alcohol is in your drink. In the [place] one drink equals one 12 oz bottle of beer (355 mL), one 5 oz glass of wine (148 mL), or one 1 oz glass of hard liquor (44 mL). General instructions  Take over-the-counter and prescription medicines only as told by your health care provider.  Keep all follow-up  visits. This is important. This includes having regularly scheduled colonoscopies. Talk to your health care provider about when you need a colonoscopy. Contact a health care provider if:  You have new or worsening bleeding during a bowel movement.  You have new or increased blood in your stool.  You have a change in bowel habits.  You lose weight for no known reason. Summary  Colon polyps are tissue growths inside the colon, which is part of the large intestine. They are one type of polyp that can grow in the body.  Most colon polyps are noncancerous (benign), but some can become cancerous over time.  This condition is diagnosed with a colonoscopy.  This condition is treated by removing any polyps that are found. Most polyps can be removed during a colonoscopy. This information is not intended to replace advice given to you by your health care provider. Make sure you discuss any questions you have with your health care provider. Document Revised: 09/17/2019 Document Reviewed: 09/17/2019 Elsevier Patient Education  2021 [place].  Colonoscopy Discharge Instructions  Read the instructions outlined below and refer to this sheet in the next few weeks. These discharge instructions provide you with general information on caring for yourself after you leave the hospital. Your doctor may also give you specific instructions. While your treatment has been planned according to the most current medical practices available, unavoidable complications occasionally occur. If you have any problems or questions after discharge, call Dr. Gala Romney at [phone]. ACTIVITY  You may resume your regular activity, but move at a slower pace for the next 24 hours.   Take frequent rest periods for the next 24 hours.   Walking will help get rid of the air and reduce the bloated feeling in your belly (abdomen).   No driving for 24 hours (because of the medicine (anesthesia) used during the test).    Do not  sign any important legal documents or operate any machinery for 24 hours (because of the anesthesia used during the test).  NUTRITION  Drink plenty of fluids.   You may resume your normal diet as instructed by your doctor.   Begin with a light meal and progress to your normal diet. Heavy or fried foods are harder to digest and may make you feel sick to your stomach (nauseated).   Avoid alcoholic beverages for 24 hours or as instructed.  MEDICATIONS  You may resume your normal medications unless your doctor tells you otherwise.  WHAT YOU CAN EXPECT TODAY  Some feelings of bloating in the abdomen.   Passage of more gas than usual.   Spotting of blood in your stool or on the toilet paper.  IF YOU HAD POLYPS  REMOVED DURING THE COLONOSCOPY:  No aspirin products for 7 days or as instructed.   No alcohol for 7 days or as instructed.   Eat a soft diet for the next 24 hours.  FINDING OUT THE RESULTS OF YOUR TEST Not all test results are available during your visit. If your test results are not back during the visit, make an appointment with your caregiver to find out the results. Do not assume everything is normal if you have not heard from your caregiver or the medical facility. It is important for you to follow up on all of your test results.  SEEK IMMEDIATE MEDICAL ATTENTION IF:  You have more than a spotting of blood in your stool.   Your belly is swollen (abdominal distention).   You are nauseated or vomiting.   You have a temperature over 101.   You have abdominal pain or discomfort that is severe or gets worse throughout the day.   Diverticulosis and polyp information provided  4 polyps removed in your colon today  Further recommendations to follow pending review of pathology report  At patient request, I called Samantha [name] at [phone] - " voicemail box full"

## 2020-09-15 NOTE — H&P (Signed)
@LOGO @   Primary Care Physician:  Andree Moro, DO Primary Gastroenterologist:  Dr. Gala Romney  Pre-Procedure History & Physical: HPI:  Samantha [name] is a 75 y.o. female here for overdue surveillance colonoscopy.  History of colonic adenoma removed about 12 years ago.  Past Medical History:  Diagnosis Date  . Allergic rhinitis   . Arthritis of both knees   . Arthritis of both shoulder regions   . Bilateral carpal tunnel syndrome   . Cataract   . Chronic low back pain   . CKD (chronic kidney disease), stage III ([place])   . Decreased vision   . Diabetes mellitus   . HTN (hypertension)   . Hyperlipidemia   . Morbid obesity ([place])     Past Surgical History:  Procedure Laterality Date  . CATARACT EXTRACTION    . COLONOSCOPY    . TUBAL LIGATION      Prior to Admission medications   Medication Sig Start Date End Date Taking? Authorizing Provider  acetaminophen (TYLENOL) 500 MG tablet Take 500-1,000 mg by mouth every 6 (six) hours as needed for mild pain.   Yes [provider]  amLODipine (NORVASC) 5 MG tablet Take 5 mg by mouth daily.   Yes [provider]  aspirin EC 81 MG tablet Take 81 mg by mouth daily.   Yes [provider]  atorvastatin (LIPITOR) 40 MG tablet Take 40 mg by mouth daily.   Yes [provider]  cloNIDine (CATAPRES) 0.1 MG tablet Take 0.1 mg by mouth daily.    Yes [provider]  diclofenac Sodium (VOLTAREN) 1 % GEL Apply 1 application topically 4 (four) times daily as needed (pain).   Yes [provider]  JARDIANCE 10 MG TABS tablet Take 10 mg by mouth daily. 05/18/20  Yes [provider]  lisinopril (ZESTRIL) 10 MG tablet Take 10 mg by mouth daily.   Yes [provider]  metFORMIN (GLUCOPHAGE) 1000 MG tablet Take 1,000 mg by mouth at bedtime. 05/20/20  Yes [provider]  nystatin (NYSTATIN) powder Apply 1 application topically 2 (two) times daily as needed (yeast).   Yes [provider]  OVER THE COUNTER MEDICATION Take 1 tablet by mouth at bedtime as needed (leg cramps). legatrin pm   Yes [provider]  fluticasone (FLONASE) 50 MCG/ACT nasal spray Place 2 sprays into both nostrils daily as needed for allergies or rhinitis.    [provider]  meclizine (ANTIVERT) 25 MG tablet Take 25 mg by mouth 3 (three) times daily as needed for dizziness.    [provider]    Allergies as of 07/28/2020  . (No Known Allergies)    Family History  Problem Relation Age of Onset  . Breast cancer Mother     Social History   Socioeconomic History  . Marital status: Widowed    Spouse name: Not on file  . Number of children: Not on file  . Years of education: Not on file  . Highest education level: Not on file  Occupational History  . Not on file  Tobacco Use  . Smoking status: Never Smoker  . Smokeless tobacco: Never Used  Substance and Sexual Activity  . Alcohol use: No  . Drug use: No  . Sexual activity: Yes    Birth control/protection: Surgical  Other Topics Concern  . Not on file  Social History Narrative  . Not on file   Social Determinants of Health   Financial Resource Strain: Not on  file  Food Insecurity: Not on file  Transportation Needs: Not on file  Physical Activity: Not on file  Stress: Not on file  Social Connections: Not on file  Intimate Partner Violence: Not on file    Review of Systems: See HPI, otherwise negative ROS  Physical Exam: BP 136/73   Temp 98.3 F (36.8 C) (Oral)   Resp 14   Ht 5\' 1"  (1.549 m)   Wt 86.2 kg   SpO2 100%   BMI 35.90 kg/m  General:   Alert,  Well-developed, well-nourished, pleasant and cooperative in NAD Neck:  Supple; no masses or thyromegaly. No significant cervical adenopathy. Lungs:  Clear throughout to auscultation.   No wheezes, crackles, or rhonchi. No acute distress. Heart:  Regular rate and rhythm; no murmurs, clicks, rubs,  or gallops. Abdomen: Non-distended,  normal bowel sounds.  Soft and nontender without appreciable mass or hepatosplenomegaly.  Pulses:  Normal pulses noted. Extremities:  Without clubbing or edema.  Impression/Plan: 75 year old lady here for surveillance colonoscopy.  History colonic adenoma. The risks, benefits, limitations, alternatives and imponderables have been reviewed with the patient. Questions have been answered. All parties are agreeable.      Notice: This dictation was prepared with Dragon dictation along with smaller phrase technology. Any transcriptional errors that result from this process are unintentional and may not be corrected upon review.

## 2020-09-17 LAB — SURGICAL PATHOLOGY

## 2020-09-19 ENCOUNTER — Encounter: Payer: Self-pay | Admitting: Internal Medicine

## 2020-09-20 ENCOUNTER — Encounter (HOSPITAL_COMMUNITY): Payer: Self-pay | Admitting: Internal Medicine

## 2020-10-14 ENCOUNTER — Ambulatory Visit: Payer: Medicare Other

## 2020-10-14 ENCOUNTER — Other Ambulatory Visit: Payer: Self-pay

## 2020-10-14 ENCOUNTER — Ambulatory Visit: Payer: Medicare Other | Admitting: Orthopedic Surgery

## 2020-10-14 ENCOUNTER — Encounter: Payer: Self-pay | Admitting: Orthopedic Surgery

## 2020-10-14 VITALS — BP 177/105 | HR 80 | Ht 61.0 in | Wt 195.0 lb

## 2020-10-14 DIAGNOSIS — M1710 Unilateral primary osteoarthritis, unspecified knee: Secondary | ICD-10-CM

## 2020-10-14 DIAGNOSIS — G8929 Other chronic pain: Secondary | ICD-10-CM

## 2020-10-14 DIAGNOSIS — M25561 Pain in right knee: Secondary | ICD-10-CM

## 2020-10-14 DIAGNOSIS — M1711 Unilateral primary osteoarthritis, right knee: Secondary | ICD-10-CM

## 2020-10-14 NOTE — Patient Instructions (Signed)
Osteoarthritis  Osteoarthritis is a type of arthritis. It refers to joint pain or joint disease. Osteoarthritis affects tissue that covers the ends of bones in joints (cartilage). Cartilage acts as a cushion between the bones and helps them move smoothly. Osteoarthritis occurs when cartilage in the joints gets worn down. Osteoarthritis is sometimes called "wear and tear" arthritis. Osteoarthritis is the most common form of arthritis. It often occurs in older people. It is a condition that gets worse over time. The joints most often affected by this condition are in the fingers, toes, hips, knees, and spine, including the neck and lower back. What are the causes? This condition is caused by the wearing down of cartilage that covers the ends of bones. What increases the risk? The following factors may make you more likely to develop this condition:  Being age 50 or older.  Obesity.  Overuse of joints.  Past injury of a joint.  Past surgery on a joint.  Family history of osteoarthritis. What are the signs or symptoms? The main symptoms of this condition are pain, swelling, and stiffness in the joint. Other symptoms may include:  An enlarged joint.  More pain and further damage caused by small pieces of bone or cartilage that break off and float inside of the joint.  Small deposits of bone (osteophytes) that grow on the edges of the joint.  A grating or scraping feeling inside the joint when you move it.  Popping or creaking sounds when you move.  Difficulty walking or exercising.  An inability to grip items, twist your hand(s), or control the movements of your hands and fingers. How is this diagnosed? This condition may be diagnosed based on:  Your medical history.  A physical exam.  Your symptoms.  X-rays of the affected joint(s).  Blood tests to rule out other types of arthritis. How is this treated? There is no cure for this condition, but treatment can help control  pain and improve joint function. Treatment may include a combination of therapies, such as:  Pain relief techniques, such as: ? Applying heat and cold to the joint. ? Massage. ? A form of talk therapy called cognitive behavioral therapy (CBT). This therapy helps you set goals and follow up on the changes that you make.  Medicines for pain and inflammation. The medicines can be taken by mouth or applied to the skin. They include: ? NSAIDs, such as ibuprofen. ? Prescription medicines. ? Strong anti-inflammatory medicines (corticosteroids). ? Certain nutritional supplements.  A prescribed exercise program. You may work with a physical therapist.  Assistive devices, such as a brace, wrap, splint, specialized glove, or cane.  A weight control plan.  Surgery, such as: ? An osteotomy. This is done to reposition the bones and relieve pain or to remove loose pieces of bone and cartilage. ? Joint replacement surgery. You may need this surgery if you have advanced osteoarthritis. Follow these instructions at home: Activity  Rest your affected joints as told by your health care provider.  Exercise as told by your health care provider. He or she may recommend specific types of exercise, such as: ? Strengthening exercises. These are done to strengthen the muscles that support joints affected by arthritis. ? Aerobic activities. These are exercises, such as brisk walking or water aerobics, that increase your heart rate. ? Range-of-motion activities. These help your joints move more easily. ? Balance and agility exercises. Managing pain, stiffness, and swelling  If directed, apply heat to the affected area as often   as told by your health care provider. Use the heat source that your health care provider recommends, such as a moist heat pack or a heating pad. ? If you have a removable assistive device, remove it as told by your health care provider. ? Place a towel between your skin and the heat  source. If your health care provider tells you to keep the assistive device on while you apply heat, place a towel between the assistive device and the heat source. ? Leave the heat on for 20-30 minutes. ? Remove the heat if your skin turns bright red. This is especially important if you are unable to feel pain, heat, or cold. You may have a greater risk of getting burned.  If directed, put ice on the affected area. To do this: ? If you have a removable assistive device, remove it as told by your health care provider. ? Put ice in a plastic bag. ? Place a towel between your skin and the bag. If your health care provider tells you to keep the assistive device on during icing, place a towel between the assistive device and the bag. ? Leave the ice on for 20 minutes, 2-3 times a day. ? Move your fingers or toes often to reduce stiffness and swelling. ? Raise (elevate) the injured area above the level of your heart while you are sitting or lying down.      General instructions  Take over-the-counter and prescription medicines only as told by your health care provider.  Maintain a healthy weight. Follow instructions from your health care provider for weight control.  Do not use any products that contain nicotine or tobacco, such as cigarettes, e-cigarettes, and chewing tobacco. If you need help quitting, ask your health care provider.  Use assistive devices as told by your health care provider.  Keep all follow-up visits as told by your health care provider. This is important. Where to find more information  National Institute of Arthritis and Musculoskeletal and Skin Diseases: www.niams.nih.gov  National Institute on Aging: www.nia.nih.gov  American College of Rheumatology: www.rheumatology.org Contact a health care provider if:  You have redness, swelling, or a feeling of warmth in a joint that gets worse.  You have a fever along with joint or muscle aches.  You develop a  rash.  You have trouble doing your normal activities. Get help right away if:  You have pain that gets worse and is not relieved by pain medicine. Summary  Osteoarthritis is a type of arthritis that affects tissue covering the ends of bones in joints (cartilage).  This condition is caused by the wearing down of cartilage that covers the ends of bones.  The main symptom of this condition is pain, swelling, and stiffness in the joint.  There is no cure for this condition, but treatment can help control pain and improve joint function. This information is not intended to replace advice given to you by your health care provider. Make sure you discuss any questions you have with your health care provider. Document Revised: 05/26/2019 Document Reviewed: 05/26/2019 Elsevier Patient Education  2021 [employer]  

## 2020-10-14 NOTE — Progress Notes (Signed)
Chief Complaint  Patient presents with  . Knee Pain    Right/ feeling a little better but pain was severe when appointment was made   NEW PROBLEM//OFFICE VISIT  Summary assessment and plan: 75 year old female recently treated for breast cancer to be having side effects estrogen reduction therapy  Inject right knee  Chief Complaint  Patient presents with  . Knee Pain    Right/ feeling a little better but pain was severe when appointment was made    75 year old female last seen 2018 for knee pain comes in today with right knee pain overall joint pain possibly secondary to hormonal reduction therapy after breast cancer  No injuries noted.  No prior treatment.    Review of Systems  Musculoskeletal: Positive for joint pain and myalgias.  Skin: Positive for rash.     Past Medical History:  Diagnosis Date  . Allergic rhinitis   . Arthritis of both knees   . Arthritis of both shoulder regions   . Bilateral carpal tunnel syndrome   . Cataract   . Chronic low back pain   . CKD (chronic kidney disease), stage III ([place])   . Decreased vision   . Diabetes mellitus   . HTN (hypertension)   . Hyperlipidemia   . Morbid obesity ([place])     Past Surgical History:  Procedure Laterality Date  . CATARACT EXTRACTION    . COLONOSCOPY    . COLONOSCOPY N/A 09/15/2020   Procedure: COLONOSCOPY;  Surgeon: Daneil Dolin, MD;  [facility];  Service: Endoscopy;  Laterality: N/A;  ASA II / PM procedure  . POLYPECTOMY  09/15/2020   Procedure: POLYPECTOMY;  Surgeon: Daneil Dolin, MD;  [facility];  Service: Endoscopy;;  . TUBAL LIGATION      Family History  Problem Relation Age of Onset  . Breast cancer Mother    Social History   Tobacco Use  . Smoking status: Never Smoker  . Smokeless tobacco: Never Used  Substance Use Topics  . Alcohol use: No  . Drug use: No    No Known Allergies  Current Meds  Medication Sig  . acetaminophen (TYLENOL) 500 MG tablet  Take 500-1,000 mg by mouth every 6 (six) hours as needed for mild pain.  [name] Kitchen amLODipine (NORVASC) 5 MG tablet Take 5 mg by mouth daily.  [name] Kitchen aspirin EC 81 MG tablet Take 81 mg by mouth daily.  [name] Kitchen atorvastatin (LIPITOR) 40 MG tablet Take 40 mg by mouth daily.  . cloNIDine (CATAPRES) 0.1 MG tablet Take 0.1 mg by mouth daily.   . diclofenac Sodium (VOLTAREN) 1 % GEL Apply 1 application topically 4 (four) times daily as needed (pain).  . fluticasone (FLONASE) 50 MCG/ACT nasal spray Place 2 sprays into both nostrils daily as needed for allergies or rhinitis.  [name] Kitchen JARDIANCE 10 MG TABS tablet Take 10 mg by mouth daily.  [name] Kitchen lisinopril (ZESTRIL) 10 MG tablet Take 10 mg by mouth daily.  . meclizine (ANTIVERT) 25 MG tablet Take 25 mg by mouth 3 (three) times daily as needed for dizziness.  . metFORMIN (GLUCOPHAGE) 1000 MG tablet Take 1,000 mg by mouth at bedtime.  [name] Kitchen nystatin (MYCOSTATIN/NYSTOP) powder Apply 1 application topically 2 (two) times daily as needed (yeast).  [name] Kitchen OVER THE COUNTER MEDICATION Take 1 tablet by mouth at bedtime as needed (leg cramps). legatrin pm    BP (!) 177/105   Pulse 80   Ht 5\' 1"  (1.549 m)   Wt 195 lb (88.5  kg)   BMI 36.84 kg/m   Physical Exam  General appearance: Well-developed well-nourished no gross deformities  Cardiovascular normal pulse and perfusion normal color without edema  Neurologically o sensation loss or deficits or pathologic reflexes  Psychological: Awake alert and oriented x3 mood and affect normal  Skin no lacerations or ulcerations no nodularity no palpable masses, no erythema or nodularity  Musculoskeletal:   Right knee tenderness medial joint line small effusion but she has good flexion in the knee up to 220 degrees knee feels stable     MEDICAL DECISION MAKING  A.  Encounter Diagnoses  Name Primary?  . Chronic pain of right knee   . Primary localized osteoarthritis of knee Yes    B. DATA ANALYSED:   IMAGING: Interpretation of  images: Internal x-rays   impression osteoarthritis right knee severe with less than 10 degree varus alignment Orders: NONE  Outside records reviewed: NONE   C. MANAGEMENT   As noted  No orders of the defined types were placed in this encounter.     Arther Abbott, MD  10/14/2020 12:03 PM   Recurrent pain right knee  Procedure note right knee injection   verbal consent was obtained to inject right knee joint  Timeout was completed to confirm the site of injection  The medications used were 6 mg Celestone with Sensorcaine Anesthesia was provided by ethyl chloride and the skin was prepped with alcohol.  After cleaning the skin with alcohol a 20-gauge needle was used to inject the right knee joint. There were no complications. A sterile bandage was applied.

## 2020-11-25 ENCOUNTER — Other Ambulatory Visit (HOSPITAL_COMMUNITY): Payer: Self-pay | Admitting: Student

## 2020-11-25 DIAGNOSIS — N1832 Chronic kidney disease, stage 3b: Secondary | ICD-10-CM

## 2020-12-02 ENCOUNTER — Ambulatory Visit (HOSPITAL_COMMUNITY)
Admission: RE | Admit: 2020-12-02 | Discharge: 2020-12-02 | Disposition: A | Discharge status: Home / Self Care | Payer: Medicare Other | Source: Ambulatory Visit | Attending: Student | Admitting: Student

## 2020-12-02 ENCOUNTER — Other Ambulatory Visit: Payer: Self-pay

## 2020-12-02 DIAGNOSIS — N1832 Chronic kidney disease, stage 3b: Secondary | ICD-10-CM | POA: Diagnosis present

## 2021-04-11 ENCOUNTER — Ambulatory Visit
Admission: EM | Admit: 2021-04-11 | Discharge: 2021-04-11 | Disposition: A | Discharge status: Home / Self Care | Payer: Medicare HMO | Attending: Family Medicine | Admitting: Family Medicine

## 2021-04-11 ENCOUNTER — Other Ambulatory Visit: Payer: Self-pay

## 2021-04-11 DIAGNOSIS — R519 Headache, unspecified: Secondary | ICD-10-CM | POA: Diagnosis not present

## 2021-04-11 MED ORDER — GABAPENTIN 100 MG PO CAPS: 100.0000 mg | ORAL_CAPSULE | Freq: Three times a day (TID) | ORAL | 0 refills | Status: DC | PRN

## 2021-04-11 MED ORDER — PREDNISONE 20 MG PO TABS: 40.0000 mg | ORAL_TABLET | Freq: Every day | ORAL | 0 refills | Status: DC

## 2021-04-11 NOTE — ED Provider Notes (Signed)
[facility]    CSN: [id number] Arrival date & time: 04/11/21  1446      History   Chief Complaint Chief Complaint  Patient presents with   Facial Pain    HPI Samantha [name] is a 75 y.o. female.   Patient presenting today with 4-day history of severe sharp right-sided facial pain originating from the TMJ region.  She states any sort of touch to the area, moving her jaw especially to chew or smile has been painful.  The pain radiates up towards sinuses and down toward her jaw.  Went to her primary care provider 1 day after onset, states they were unsure what the issue could be and told her to take over-the-counter pain relievers.  Pain has worsened since this time.  No swelling, discoloration, rashes, fever, chills, injury to the area, dental issues, ear pain, nasal congestion, recent illness.  No past history of similar issues.  Not trying anything over-the-counter at this time.   Past Medical History:  Diagnosis Date   Allergic rhinitis    Arthritis of both knees    Arthritis of both shoulder regions    Bilateral carpal tunnel syndrome    Cataract    Chronic low back pain    CKD (chronic kidney disease), stage III ([facility])    Decreased vision    Diabetes mellitus    HTN (hypertension)    Hyperlipidemia    Morbid obesity [facility])     Patient Active Problem List   Diagnosis Date Noted   KNEE, ARTHRITIS, DEGEN./OSTEO 12/16/2007   DERANGEMENT MENISCUS 12/16/2007   DIABETES MELLITUS, TYPE II, CONTROLLED 12/18/2006   HYPERLIPIDEMIA 05/07/2006   OBESITY NOS 05/07/2006   ANEMIA-NOS 05/07/2006   CARPAL TUNNEL SYNDROME 05/07/2006   HYPERTENSION 05/07/2006    Past Surgical History:  Procedure Laterality Date   CATARACT EXTRACTION     COLONOSCOPY     COLONOSCOPY N/A 09/15/2020   Procedure: COLONOSCOPY;  Surgeon: Daneil Dolin, MD;  [facility];  Service: Endoscopy;  Laterality: N/A;  ASA II / PM procedure   POLYPECTOMY  09/15/2020   Procedure: POLYPECTOMY;   Surgeon: Daneil Dolin, MD;  [facility];  Service: Endoscopy;;   TUBAL LIGATION      OB History   No obstetric history on file.      Home Medications    Prior to Admission medications   Medication Sig Start Date End Date Taking? Authorizing Provider  gabapentin (NEURONTIN) 100 MG capsule Take 1 capsule (100 mg total) by mouth 3 (three) times daily as needed. 04/11/21  Yes Volney American, PA-C  predniSONE (DELTASONE) 20 MG tablet Take 2 tablets (40 mg total) by mouth daily with breakfast. 04/11/21  Yes Volney American, PA-C  acetaminophen (TYLENOL) 500 MG tablet Take 500-1,000 mg by mouth every 6 (six) hours as needed for mild pain.    [provider]  amLODipine (NORVASC) 5 MG tablet Take 5 mg by mouth daily.    [provider]  aspirin EC 81 MG tablet Take 81 mg by mouth daily.    [provider]  atorvastatin (LIPITOR) 40 MG tablet Take 40 mg by mouth daily.    [provider]  cloNIDine (CATAPRES) 0.1 MG tablet Take 0.1 mg by mouth daily.     [provider]  diclofenac Sodium (VOLTAREN) 1 % GEL Apply 1 application topically 4 (four) times daily as needed (pain).    [provider]  fluticasone Asencion Islam)  50 MCG/ACT nasal spray Place 2 sprays into both nostrils daily as needed for allergies or rhinitis.    [provider]  JARDIANCE 10 MG TABS tablet Take 10 mg by mouth daily. 05/18/20   [provider]  lisinopril (ZESTRIL) 10 MG tablet Take 10 mg by mouth daily.    [provider]  meclizine (ANTIVERT) 25 MG tablet Take 25 mg by mouth 3 (three) times daily as needed for dizziness.    [provider]  metFORMIN (GLUCOPHAGE) 1000 MG tablet Take 1,000 mg by mouth at bedtime. 05/20/20   [provider]  nystatin (MYCOSTATIN/NYSTOP) powder Apply 1 application topically 2 (two) times daily as needed (yeast).    [provider]  OVER THE COUNTER MEDICATION  Take 1 tablet by mouth at bedtime as needed (leg cramps). legatrin pm    [provider]    Family History Family History  Problem Relation Age of Onset   Breast cancer Mother     Social History Social History   Tobacco Use   Smoking status: Never   Smokeless tobacco: Never  Substance Use Topics   Alcohol use: No   Drug use: No     Allergies   Patient has no known allergies.   Review of Systems Review of Systems Per HPI  Physical Exam Triage Vital Signs ED Triage Vitals  Enc Vitals Group     BP 04/11/21 1622 (!) 154/87     Pulse Rate 04/11/21 1622 77     Resp 04/11/21 1622 18     Temp 04/11/21 1622 98.5 F (36.9 C)     Temp Source 04/11/21 1622 Oral     SpO2 04/11/21 1622 98 %     Weight --      Height --      Head Circumference --      Peak Flow --      Pain Score 04/11/21 1603 6     Pain Loc --      Pain Edu? --      Excl. in [place]? --    No data found.  Updated Vital Signs BP (!) 154/87 (BP Location: Right Arm)   Pulse 77   Temp 98.5 F (36.9 C) (Oral)   Resp 18   SpO2 98%   Visual Acuity Right Eye Distance:   Left Eye Distance:   Bilateral Distance:    Right Eye Near:   Left Eye Near:    Bilateral Near:     Physical Exam Vitals and nursing note reviewed.  Constitutional:      Appearance: Normal appearance. She is not ill-appearing.  HENT:     Head: Atraumatic.     Right Ear: Tympanic membrane normal.     Left Ear: Tympanic membrane normal.     Nose: Nose normal.     Mouth/Throat:     Mouth: Mucous membranes are moist.     Pharynx: No oropharyngeal exudate or posterior oropharyngeal erythema.  Eyes:     Extraocular Movements: Extraocular movements intact.     Conjunctiva/sclera: Conjunctivae normal.  Cardiovascular:     Rate and Rhythm: Normal rate and regular rhythm.     Heart sounds: Normal heart sounds.  Pulmonary:     Effort: Pulmonary effort is normal. No respiratory distress.     Breath sounds: Normal breath  sounds. No wheezing or rales.  Musculoskeletal:        General: Tenderness present. No swelling, deformity or signs of injury. Normal range of  motion.     Cervical back: Normal range of motion and neck supple.     Comments: Significant tenderness to palpation from right TMJ extending to cheek, sinuses, jaw.  Worse with movement of the jaw  Skin:    General: Skin is warm and dry.     Findings: No erythema or rash.  Neurological:     Mental Status: She is alert and oriented to person, place, and time.     Motor: No weakness.     Gait: Gait normal.  Psychiatric:        Mood and Affect: Mood normal.        Thought Content: Thought content normal.        Judgment: Judgment normal.     UC Treatments / Results  Labs (all labs ordered are listed, but only abnormal results are displayed) Labs Reviewed - No data to display  EKG   Radiology No results found.  Procedures Procedures (including critical care time)  Medications Ordered in UC Medications - No data to display  Initial Impression / Assessment and Plan / UC Course  I have reviewed the triage vital signs and the nursing notes.  Pertinent labs & imaging results that were available during my care of the patient were reviewed by me and considered in my medical decision making (see chart for details).     No obvious infectious process, suspect a nerve irritation such as geminal neuralgia.  We will treat with short course of prednisone, gabapentin, pain relievers.  Close PCP follow-up recommended, follow-up sooner with worsening symptoms.  Final Clinical Impressions(s) / UC Diagnoses   Final diagnoses:  Right facial pain   Discharge Instructions   None    ED Prescriptions     Medication Sig Dispense Auth. Provider   predniSONE (DELTASONE) 20 MG tablet Take 2 tablets (40 mg total) by mouth daily with breakfast. 10 tablet Volney American, PA-C   gabapentin (NEURONTIN) 100 MG capsule Take 1 capsule (100 mg total)  by mouth 3 (three) times daily as needed. 90 capsule Volney American, [place]      PDMP not reviewed this encounter.   Volney American, [place] 04/11/21 1654

## 2021-04-11 NOTE — ED Triage Notes (Addendum)
Pt is here with sinuses that started 4 days ago, pt has not taken any meds to relieve discomfort. Pt also states that the facial pain is on her right side and is going down her to her right shoulder which is on and off.

## 2021-04-20 ENCOUNTER — Other Ambulatory Visit: Payer: Self-pay | Admitting: Student

## 2021-04-20 DIAGNOSIS — G8929 Other chronic pain: Secondary | ICD-10-CM

## 2021-04-20 DIAGNOSIS — K6289 Other specified diseases of anus and rectum: Secondary | ICD-10-CM

## 2021-04-20 DIAGNOSIS — J329 Chronic sinusitis, unspecified: Secondary | ICD-10-CM

## 2021-04-30 ENCOUNTER — Other Ambulatory Visit: Payer: Self-pay

## 2021-04-30 ENCOUNTER — Ambulatory Visit
Admission: RE | Admit: 2021-04-30 | Discharge: 2021-04-30 | Disposition: A | Discharge status: Home / Self Care | Payer: Medicare HMO | Source: Ambulatory Visit | Attending: Student | Admitting: Student

## 2021-04-30 DIAGNOSIS — J329 Chronic sinusitis, unspecified: Secondary | ICD-10-CM

## 2021-04-30 DIAGNOSIS — G8929 Other chronic pain: Secondary | ICD-10-CM

## 2021-06-24 ENCOUNTER — Other Ambulatory Visit: Payer: Self-pay | Admitting: Otolaryngology

## 2021-06-24 DIAGNOSIS — R221 Localized swelling, mass and lump, neck: Secondary | ICD-10-CM

## 2021-07-06 ENCOUNTER — Ambulatory Visit
Admission: RE | Admit: 2021-07-06 | Discharge: 2021-07-06 | Disposition: A | Discharge status: Home / Self Care | Payer: Medicare Other | Source: Ambulatory Visit | Attending: Otolaryngology | Admitting: Otolaryngology

## 2021-07-06 DIAGNOSIS — R221 Localized swelling, mass and lump, neck: Secondary | ICD-10-CM

## 2021-07-19 ENCOUNTER — Other Ambulatory Visit: Payer: Self-pay | Admitting: Otolaryngology

## 2021-07-19 DIAGNOSIS — E042 Nontoxic multinodular goiter: Secondary | ICD-10-CM

## 2021-08-03 ENCOUNTER — Other Ambulatory Visit (HOSPITAL_COMMUNITY)
Admission: RE | Admit: 2021-08-03 | Discharge: 2021-08-03 | Disposition: A | Discharge status: Home / Self Care | Payer: Medicare HMO | Source: Ambulatory Visit | Attending: Otolaryngology | Admitting: Otolaryngology

## 2021-08-03 ENCOUNTER — Ambulatory Visit
Admission: RE | Admit: 2021-08-03 | Discharge: 2021-08-03 | Disposition: A | Discharge status: Home / Self Care | Payer: Medicare HMO | Source: Ambulatory Visit | Attending: Otolaryngology | Admitting: Otolaryngology

## 2021-08-03 DIAGNOSIS — E041 Nontoxic single thyroid nodule: Secondary | ICD-10-CM | POA: Diagnosis present

## 2021-08-03 DIAGNOSIS — D34 Benign neoplasm of thyroid gland: Secondary | ICD-10-CM | POA: Diagnosis not present

## 2021-08-03 DIAGNOSIS — E042 Nontoxic multinodular goiter: Secondary | ICD-10-CM

## 2021-08-04 LAB — CYTOLOGY - NON PAP

## 2021-08-07 ENCOUNTER — Ambulatory Visit (INDEPENDENT_AMBULATORY_CARE_PROVIDER_SITE_OTHER): Payer: Medicare HMO

## 2021-08-07 ENCOUNTER — Other Ambulatory Visit: Payer: Self-pay

## 2021-08-07 ENCOUNTER — Ambulatory Visit
Admission: EM | Admit: 2021-08-07 | Discharge: 2021-08-07 | Disposition: A | Discharge status: Home / Self Care | Payer: Medicare HMO | Attending: Family Medicine | Admitting: Family Medicine

## 2021-08-07 DIAGNOSIS — M79641 Pain in right hand: Secondary | ICD-10-CM

## 2021-08-07 DIAGNOSIS — M7989 Other specified soft tissue disorders: Secondary | ICD-10-CM | POA: Diagnosis not present

## 2021-08-07 MED ORDER — DICLOFENAC SODIUM 1 % EX GEL: 2.0000 g | Freq: Four times a day (QID) | CUTANEOUS | 0 refills | Status: DC

## 2021-08-07 NOTE — ED Triage Notes (Signed)
Pt states that for the past 3 days her right hand has hurt  Pt states that it started swelling yesterday  Pt states she only took 2 tylenols for the pain

## 2021-08-08 ENCOUNTER — Ambulatory Visit: Payer: Self-pay

## 2021-08-10 NOTE — ED Provider Notes (Signed)
[facility]    CSN: [id number] Arrival date & time: 08/07/21  1547      History   Chief Complaint Chief Complaint  Patient presents with   Hand Injury    Right hand swollen and in pain    HPI Samantha [name] is a 76 y.o. female.   Presenting today with 3-day history of diffuse right hand pain, swelling the past day.  Denies numbness, tingling, decreased range of motion, discoloration, known injury to the area.  She has tried Tylenol with mild temporary leaf of the pain.  States she has an appointment with her primary care provider in 2 days but was told to come get it x-rayed first.  No past history of similar issues.   Past Medical History:  Diagnosis Date   Allergic rhinitis    Arthritis of both knees    Arthritis of both shoulder regions    Bilateral carpal tunnel syndrome    Cataract    Chronic low back pain    CKD (chronic kidney disease), stage III ([facility])    Decreased vision    Diabetes mellitus    HTN (hypertension)    Hyperlipidemia    Morbid obesity [facility])     Patient Active Problem List   Diagnosis Date Noted   KNEE, ARTHRITIS, DEGEN./OSTEO 12/16/2007   DERANGEMENT MENISCUS 12/16/2007   DIABETES MELLITUS, TYPE II, CONTROLLED 12/18/2006   HYPERLIPIDEMIA 05/07/2006   OBESITY NOS 05/07/2006   ANEMIA-NOS 05/07/2006   CARPAL TUNNEL SYNDROME 05/07/2006   HYPERTENSION 05/07/2006    Past Surgical History:  Procedure Laterality Date   CATARACT EXTRACTION     COLONOSCOPY     COLONOSCOPY N/A 09/15/2020   Procedure: COLONOSCOPY;  Surgeon: Daneil Dolin, MD;  [facility];  Service: Endoscopy;  Laterality: N/A;  ASA II / PM procedure   POLYPECTOMY  09/15/2020   Procedure: POLYPECTOMY;  Surgeon: Daneil Dolin, MD;  [facility];  Service: Endoscopy;;   TUBAL LIGATION      OB History   No obstetric history on file.      Home Medications    Prior to Admission medications   Medication Sig Start Date End Date Taking?  Authorizing Provider  diclofenac Sodium (VOLTAREN) 1 % GEL Apply 2 g topically 4 (four) times daily. 08/07/21  Yes Volney American, PA-C  acetaminophen (TYLENOL) 500 MG tablet Take 500-1,000 mg by mouth every 6 (six) hours as needed for mild pain.    [provider]  amLODipine (NORVASC) 5 MG tablet Take 5 mg by mouth daily.    [provider]  aspirin EC 81 MG tablet Take 81 mg by mouth daily.    [provider]  atorvastatin (LIPITOR) 40 MG tablet Take 40 mg by mouth daily.    [provider]  cloNIDine (CATAPRES) 0.1 MG tablet Take 0.1 mg by mouth daily.     [provider]  diclofenac Sodium (VOLTAREN) 1 % GEL Apply 1 application topically 4 (four) times daily as needed (pain).    [provider]  fluticasone (FLONASE) 50 MCG/ACT nasal spray Place 2 sprays into both nostrils daily as needed for allergies or rhinitis.    [provider]  gabapentin (NEURONTIN) 100 MG capsule Take 1 capsule (100 mg total) by mouth 3 (three) times daily as needed. 04/11/21   Volney American, PA-C  JARDIANCE 10 MG TABS tablet Take 10 mg by mouth daily. 05/18/20   [provider]  lisinopril (ZESTRIL) 10 MG tablet Take 10 mg by mouth daily.    [provider]  meclizine (ANTIVERT) 25 MG tablet Take 25 mg by mouth 3 (three) times daily as needed for dizziness.    [provider]  metFORMIN (GLUCOPHAGE) 1000 MG tablet Take 1,000 mg by mouth at bedtime. 05/20/20   [provider]  nystatin (MYCOSTATIN/NYSTOP) powder Apply 1 application topically 2 (two) times daily as needed (yeast).    [provider]  OVER THE COUNTER MEDICATION Take 1 tablet by mouth at bedtime as needed (leg cramps). legatrin pm    [provider]  predniSONE (DELTASONE) 20 MG tablet Take 2 tablets (40 mg total) by mouth daily with breakfast. 04/11/21   Volney American, PA-C    Family History Family History   Problem Relation Age of Onset   Breast cancer Mother     Social History Social History   Tobacco Use   Smoking status: Never   Smokeless tobacco: Never  Vaping Use   Vaping Use: Never used  Substance Use Topics   Alcohol use: No   Drug use: No     Allergies   Patient has no known allergies.   Review of Systems Review of Systems Per HPI  Physical Exam Triage Vital Signs ED Triage Vitals  Enc Vitals Group     BP 08/07/21 1553 (!) 164/108     Pulse Rate 08/07/21 1553 73     Resp --      Temp 08/07/21 1553 98.3 F (36.8 C)     Temp Source 08/07/21 1553 Oral     SpO2 08/07/21 1553 98 %     Weight --      Height --      Head Circumference --      Peak Flow --      Pain Score 08/07/21 1554 5     Pain Loc --      Pain Edu? --      Excl. in [place]? --    No data found.  Updated Vital Signs BP (!) 164/108 (BP Location: Right Arm)    Pulse 73    Temp 98.3 F (36.8 C) (Oral)    SpO2 98%   Visual Acuity Right Eye Distance:   Left Eye Distance:   Bilateral Distance:    Right Eye Near:   Left Eye Near:    Bilateral Near:     Physical Exam Vitals and nursing note reviewed.  Constitutional:      Appearance: Normal appearance. She is not ill-appearing.  HENT:     Head: Atraumatic.  Eyes:     Extraocular Movements: Extraocular movements intact.     Conjunctiva/sclera: Conjunctivae normal.  Cardiovascular:     Rate and Rhythm: Normal rate and regular rhythm.     Heart sounds: Normal heart sounds.  Pulmonary:     Effort: Pulmonary effort is normal.     Breath sounds: Normal breath sounds.  Musculoskeletal:        General: Swelling and tenderness present. No deformity or signs of injury. Normal range of motion.     Cervical back: Normal range of motion and neck supple.     Comments: Diffuse mild right hand swelling, tenderness to palpation without bony deformity palpable or loss of range of motion.  Skin:    General: Skin is warm and dry.     Findings: No  bruising or erythema.  Neurological:     Mental Status: She is alert  and oriented to person, place, and time.     Comments: Right hand neurovascularly intact  Psychiatric:        Mood and Affect: Mood normal.        Thought Content: Thought content normal.        Judgment: Judgment normal.     UC Treatments / Results  Labs (all labs ordered are listed, but only abnormal results are displayed) Labs Reviewed - No data to display  EKG   Radiology No results found.  Procedures Procedures (including critical care time)  Medications Ordered in UC Medications - No data to display  Initial Impression / Assessment and Plan / UC Course  I have reviewed the triage vital signs and the nursing notes.  Pertinent labs & imaging results that were available during my care of the patient were reviewed by me and considered in my medical decision making (see chart for details).     X-ray showing osteoarthritis but no acute bony abnormality.  Discussed diclofenac gel, ice, elevation and other supportive home care.  Follow-up as scheduled for recheck.  Final Clinical Impressions(s) / UC Diagnoses   Final diagnoses:  Right hand pain  Swelling of right hand   Discharge Instructions   None    ED Prescriptions     Medication Sig Dispense Auth. Provider   diclofenac Sodium (VOLTAREN) 1 % GEL Apply 2 g topically 4 (four) times daily. 150 g Volney American, [place]      PDMP not reviewed this encounter.   Volney American, [place] 08/10/21 1933

## 2022-06-19 ENCOUNTER — Telehealth: Payer: Self-pay | Admitting: Orthopedic Surgery

## 2022-06-19 NOTE — Telephone Encounter (Signed)
Patient called, lvm requesting to schedule w/Dr. Aline Brochure.  I called the patient back and lvm for her to call us.  Pt's # [phone]

## 2022-06-20 DIAGNOSIS — M25561 Pain in right knee: Secondary | ICD-10-CM | POA: Insufficient documentation

## 2022-06-28 ENCOUNTER — Ambulatory Visit: Payer: Medicare HMO | Admitting: Orthopedic Surgery

## 2022-06-28 VITALS — BP 144/78 | HR 89 | Ht 61.0 in | Wt 180.0 lb

## 2022-06-28 DIAGNOSIS — M1711 Unilateral primary osteoarthritis, right knee: Secondary | ICD-10-CM | POA: Diagnosis not present

## 2022-06-28 MED ORDER — METHYLPREDNISOLONE ACETATE 40 MG/ML IJ SUSP
40.0000 mg | Freq: Once | INTRAMUSCULAR | Status: AC
  Administered 2022-06-28: 40 mg via INTRA_ARTICULAR

## 2022-06-28 NOTE — Progress Notes (Signed)
Chief Complaint  Patient presents with   Knee Pain    Right- no injury- thinks the rainy weather last Saturday started it couldn't get up out of bed    77 year old female had acute onset of right knee pain where she had inability to bear weight.  Most the pain was on the medial side of the joint and she had trouble getting out of bed  With time and some topical liniment it seemed to have gotten better although she still has a slight limp and has medial joint line pain  She was last seen in May 2022 at that time she had osteoarthritis and had an injection that lasted for approximately 2 years.  She denies any trauma to the right knee  Physical Exam Musculoskeletal:     Right knee: Bony tenderness and crepitus present. No swelling, deformity, effusion, erythema, ecchymosis or lacerations. Normal range of motion. Tenderness present over the medial joint line. No LCL laxity, MCL laxity, ACL laxity or PCL laxity. Abnormal alignment. Normal pulse.    Images were brought in with the patient on the disc as she went to urgent care.  X-rays show grade 4 arthritis of the right knee slight varus alignment there are some osteophytes noted as well  She is amenable to getting an injection in the right knee.  We discussed the indications for surgery.  She is not at that point as she is functioning very well and most of her symptoms have resolved   Procedure note right knee injection   verbal consent was obtained to inject right knee joint  Timeout was completed to confirm the site of injection  The medications used were depomedrol 40 mg and 1% lidocaine 3 cc Anesthesia was provided by ethyl chloride and the skin was prepped with alcohol.  After cleaning the skin with alcohol a 20-gauge needle was used to inject the right knee joint. There were no complications. A sterile bandage was applied.   Outside films

## 2022-06-28 NOTE — Patient Instructions (Signed)

## 2022-07-29 ENCOUNTER — Emergency Department (HOSPITAL_COMMUNITY): Payer: Medicare HMO

## 2022-07-29 ENCOUNTER — Emergency Department (HOSPITAL_COMMUNITY)
Admission: EM | Admit: 2022-07-29 | Discharge: 2022-07-29 | Disposition: A | Discharge status: Home / Self Care | Payer: Medicare HMO | Attending: Emergency Medicine | Admitting: Emergency Medicine

## 2022-07-29 ENCOUNTER — Other Ambulatory Visit: Payer: Self-pay

## 2022-07-29 ENCOUNTER — Encounter (HOSPITAL_COMMUNITY): Payer: Self-pay

## 2022-07-29 DIAGNOSIS — Z7982 Long term (current) use of aspirin: Secondary | ICD-10-CM | POA: Insufficient documentation

## 2022-07-29 DIAGNOSIS — Z7984 Long term (current) use of oral hypoglycemic drugs: Secondary | ICD-10-CM | POA: Diagnosis not present

## 2022-07-29 DIAGNOSIS — Z79899 Other long term (current) drug therapy: Secondary | ICD-10-CM | POA: Insufficient documentation

## 2022-07-29 DIAGNOSIS — E119 Type 2 diabetes mellitus without complications: Secondary | ICD-10-CM | POA: Diagnosis not present

## 2022-07-29 DIAGNOSIS — R1031 Right lower quadrant pain: Secondary | ICD-10-CM | POA: Diagnosis present

## 2022-07-29 DIAGNOSIS — I10 Essential (primary) hypertension: Secondary | ICD-10-CM | POA: Insufficient documentation

## 2022-07-29 LAB — CBC WITH DIFFERENTIAL/PLATELET
Abs Immature Granulocytes: 0.02 10*3/uL (ref 0.00–0.07)
Basophils Absolute: 0 10*3/uL (ref 0.0–0.1)
Basophils Relative: 0 %
Eosinophils Absolute: 0 10*3/uL (ref 0.0–0.5)
Eosinophils Relative: 1 %
HCT: 36.6 % (ref 36.0–46.0)
Hemoglobin: 11.3 g/dL — ABNORMAL LOW (ref 12.0–15.0)
Immature Granulocytes: 0 %
Lymphocytes Relative: 30 %
Lymphs Abs: 2 10*3/uL (ref 0.7–4.0)
MCH: 25.9 pg — ABNORMAL LOW (ref 26.0–34.0)
MCHC: 30.9 g/dL (ref 30.0–36.0)
MCV: 83.9 fL (ref 80.0–100.0)
Monocytes Absolute: 0.5 10*3/uL (ref 0.1–1.0)
Monocytes Relative: 7 %
Neutro Abs: 4.3 10*3/uL (ref 1.7–7.7)
Neutrophils Relative %: 62 %
Platelets: 293 10*3/uL (ref 150–400)
RBC: 4.36 MIL/uL (ref 3.87–5.11)
RDW: 14.2 % (ref 11.5–15.5)
WBC: 6.9 10*3/uL (ref 4.0–10.5)
nRBC: 0 % (ref 0.0–0.2)

## 2022-07-29 LAB — COMPREHENSIVE METABOLIC PANEL
ALT: 16 U/L (ref 0–44)
AST: 15 U/L (ref 15–41)
Albumin: 3.5 g/dL (ref 3.5–5.0)
Alkaline Phosphatase: 103 U/L (ref 38–126)
Anion gap: 8 (ref 5–15)
BUN: 13 mg/dL (ref 8–23)
CO2: 24 mmol/L (ref 22–32)
Calcium: 9.5 mg/dL (ref 8.9–10.3)
Chloride: 108 mmol/L (ref 98–111)
Creatinine, Ser: 0.9 mg/dL (ref 0.44–1.00)
GFR, Estimated: >60 mL/min (ref >60)
Glucose, Bld: 148 mg/dL — ABNORMAL HIGH (ref 70–99)
Potassium: 3.5 mmol/L (ref 3.5–5.1)
Sodium: 140 mmol/L (ref 135–145)
Total Bilirubin: 0.7 mg/dL (ref 0.3–1.2)
Total Protein: 7 g/dL (ref 6.5–8.1)

## 2022-07-29 MED ORDER — DEXAMETHASONE SODIUM PHOSPHATE 10 MG/ML IJ SOLN
10.0000 mg | Freq: Once | INTRAMUSCULAR | Status: AC
  Administered 2022-07-29: 10 mg via INTRAVENOUS
  Filled 2022-07-29: qty 1

## 2022-07-29 MED ORDER — PREDNISONE 20 MG PO TABS: 40.0000 mg | ORAL_TABLET | Freq: Every day | ORAL | 0 refills | Status: DC

## 2022-07-29 MED ORDER — DICLOFENAC EPOLAMINE 1.3 % EX PTCH
1.0000 | MEDICATED_PATCH | Freq: Two times a day (BID) | CUTANEOUS | Status: DC
  Administered 2022-07-29: 1 via TRANSDERMAL
  Filled 2022-07-29: qty 1

## 2022-07-29 NOTE — ED Triage Notes (Signed)
Patient presents to ED via POV, pt.c/o  groin pain to right side radiating to right leg. Denies any injury. No redness or swelling noted.

## 2022-07-29 NOTE — ED Notes (Signed)
To CT

## 2022-07-29 NOTE — Discharge Instructions (Addendum)
As discussed, your groin pain is likely due to inflammation of the nerves that control sensation from that area.  Please take all medication as directed and use the patches provided.  Return here for concerning changes in your condition.  The patches should be placed just at the junction of your spine in your pelvis on the back in the midline.  Use these every 12 hours then switch to the next 1.

## 2022-07-29 NOTE — ED Provider Notes (Signed)
[place] Provider Note   CSN: [id number] Arrival date & time: 07/29/22  [id number]     History  Chief Complaint  Patient presents with   Groin Pain    Samantha [name] is a 77 y.o. female.  HPI Patient presents with her daughter who assists with the history.  Patient is generally well, was so until 2 days ago, when she developed pain in the right inguinal crease.  No fall, no trauma, no identifiable precipitant.  Since that time pain has been persistent. No fevers, chills, chest pain, dyspnea, no other abdominal pain.  No genital complaints, no polyuria, no dysuria.       Home Medications Prior to Admission medications   Medication Sig Start Date End Date Taking? Authorizing Provider  acetaminophen (TYLENOL) 500 MG tablet Take 500-1,000 mg by mouth every 6 (six) hours as needed for mild pain.   Yes [provider]  amLODipine (NORVASC) 5 MG tablet Take 5 mg by mouth daily.   Yes [provider]  aspirin EC 81 MG tablet Take 81 mg by mouth daily.   Yes [provider]  atorvastatin (LIPITOR) 40 MG tablet Take 40 mg by mouth daily.   Yes [provider]  diclofenac Sodium (VOLTAREN) 1 % GEL Apply 2 g topically 4 (four) times daily. Patient taking differently: Apply 2 g topically 4 (four) times daily as needed (pain). 08/07/21  Yes Volney American, PA-C  fluticasone [facility]) 50 MCG/ACT nasal spray Place 2 sprays into both nostrils daily as needed for allergies or rhinitis.   Yes [provider]  JARDIANCE 25 MG TABS tablet Take 25 mg by mouth daily. 07/20/22  Yes [provider]  lisinopril (ZESTRIL) 20 MG tablet Take 20 mg by mouth every morning. 07/20/22  Yes [provider]  meclizine (ANTIVERT) 25 MG tablet Take 25 mg by mouth 3 (three) times daily as needed for dizziness.   Yes [provider]  metFORMIN (GLUCOPHAGE-XR) 500 MG 24 hr tablet Take 500 mg by mouth daily.  07/18/22  Yes [provider]  nystatin (MYCOSTATIN/NYSTOP) powder Apply 1 application topically 2 (two) times daily as needed (yeast).   Yes [provider]  OVER THE COUNTER MEDICATION Take 1 tablet by mouth at bedtime as needed (leg cramps). legatrin pm   Yes [provider]      Allergies    Patient has no allergy information on record.    Review of Systems   Review of Systems  All other systems reviewed and are negative.   Physical Exam Updated Vital Signs BP (!) 145/70   Pulse 91   Temp 97.8 F (36.6 C) (Oral)   Resp 18   SpO2 100%  Physical Exam Vitals and nursing note reviewed.  Constitutional:      General: She is not in acute distress.    Appearance: She is well-developed. She is obese.  HENT:     Head: Normocephalic and atraumatic.  Eyes:     Conjunctiva/sclera: Conjunctivae normal.  Cardiovascular:     Rate and Rhythm: Normal rate and regular rhythm.  Pulmonary:     Effort: Pulmonary effort is normal. No respiratory distress.     Breath sounds: Normal breath sounds. No stridor.  Abdominal:     General: There is no distension.  Genitourinary:    Comments: Chaperone/daughter Musculoskeletal:     Comments: Patient has pain with hip range of motion, but can flex her hip to  command, and spontaneously.  Skin:    General: Skin is warm and dry.     Comments: No identifiable skin changes  Neurological:     Mental Status: She is alert and oriented to person, place, and time.     Cranial Nerves: No cranial nerve deficit.  Psychiatric:        Mood and Affect: Mood normal.     ED Results / Procedures / Treatments   Labs (all labs ordered are listed, but only abnormal results are displayed) Labs Reviewed  COMPREHENSIVE METABOLIC PANEL - Abnormal; Notable for the following components:      Result Value   Glucose, Bld 148 (*)    All other components within normal limits  CBC WITH DIFFERENTIAL/PLATELET - Abnormal; Notable for the  following components:   Hemoglobin 11.3 (*)    MCH 25.9 (*)    All other components within normal limits    EKG None  Radiology CT Renal Stone Study  Result Date: 07/29/2022 CLINICAL DATA:  Abdominal/flank pain.  Kidney stones suspected. EXAM: CT ABDOMEN AND PELVIS WITHOUT CONTRAST TECHNIQUE: Multidetector CT imaging of the abdomen and pelvis was performed following the standard protocol without IV contrast. RADIATION DOSE REDUCTION: This exam was performed according to the departmental dose-optimization program which includes automated exposure control, adjustment of the mA and/or kV according to patient size and/or use of iterative reconstruction technique. COMPARISON:  Prior CT scan of the abdomen and pelvis 08/30/2017 FINDINGS: Lower chest: Linear scarring in both lung bases. No acute abnormality. Hepatobiliary: Small circumscribed low-attenuation lesions in the liver are incompletely characterized in the absence of intravenous contrast but remain stable compared to prior imaging from 2019 and are therefore almost certainly benign. Gallbladder is unremarkable. No intra or extrahepatic biliary ductal dilatation. Pancreas: Unremarkable. No pancreatic ductal dilatation or surrounding inflammatory changes. Spleen: No splenic injury or perisplenic hematoma. Adrenals/Urinary Tract: Unremarkable adrenal glands. No evidence of hydronephrosis, nephrolithiasis or renal contour abnormality to suggest underlying renal mass. Unremarkable appearance of the bladder. Stomach/Bowel: Normal appendix. No focal bowel wall thickening. No evidence of bowel obstruction. Vascular/Lymphatic: Limited evaluation in the absence of intravenous contrast. No evidence of aneurysm. Scattered calcified atherosclerotic plaque. No suspicious lymphadenopathy. Reproductive: Uterus and bilateral adnexa are unremarkable. Other: No abdominal wall hernia or abnormality. No abdominopelvic ascites. Musculoskeletal: No acute fracture,  malalignment or evidence of osseous lesion. Degenerative changes are present throughout the lumbar spine with significant degenerative disc disease at L5-S1 with both anterior and posterior disc extrusion. Bilateral facet arthropathy is also present throughout the lumbar spine including L3-L4, L4-L5 and L5-S1. Mild grade 1 anterolisthesis of L4 on L5 measures approximately 5 mm. Schmorl's node defects in the superior endplates at L1 and T9 and T10. IMPRESSION: 1. No evidence of nephrolithiasis, hydronephrosis or other acute genitourinary abnormality. 2. No acute abnormality within the abdomen or pelvis. 3. Lower lumbar degenerative disc disease and bilateral facet arthropathy. 4.  Aortic Atherosclerosis (ICD10-I70.0). Electronically Signed   By: Jacqulynn Cadet M.D.   On: 07/29/2022 11:48    Procedures Procedures    Medications Ordered in ED Medications  diclofenac (FLECTOR) 1.3 % 1 patch (1 patch Transdermal Patch Applied 07/29/22 1239)  dexamethasone (DECADRON) injection 10 mg (10 mg Intravenous Given 07/29/22 1234)    ED Course/ Medical Decision Making/ A&P                             Medical Decision Making Patient with multiple  medical problems including obesity, hypertension, diabetes presents with right inguinal crease pain.  Some suspicion for radiculopathy given the description of pain, more severe with activity.  Other considerations include urinary tract dysfunction, less likely GI pathology given otherwise reassuring exam, story.  CT labs urinalysis ordered.   Amount and/or Complexity of Data Reviewed Independent Historian: caregiver Labs: ordered. Radiology: ordered.  Risk Prescription drug management.   3:27 PM On repeat exam the patient is moving her legs freely before I even entered the exam room.  She is hemodynamically unremarkable.  She is accompanied by her husband.  I reviewed her x-ray, CT, some suspicion for radiculopathy given the absence of evidence for other  acute findings, reassuring labs.  She has no urinary complaints, is afebrile no leukocytosis, no urinalysis indicated.  Patient notes that she has previously had sciatica on the opposite side, and with this, and CT findings are suspicion for lumbosacral radiculopathy, patient discharged in stable condition to follow-up as an outpatient.        Final Clinical Impression(s) / ED Diagnoses Final diagnoses:  Right inguinal pain    Rx / DC Orders ED Discharge Orders     None         Carmin Muskrat, MD 07/29/22 1527

## 2022-08-03 NOTE — [place] Notes (Signed)
Cardiology Office Note   Date:  08/04/2022   ID:  Samantha [name] 1945-09-05, MRN [id number]  PCP:  Cipriano Mile, NP  Cardiologist:  Dr. Johnsie Cancel     Chief Complaint  Patient presents with   Palpitations   Dizziness      History of Present Illness: Samantha [name] is a 77 y.o. female who presents for palpitations dizziness.   last OV 05/12/20   History of DM, HTN, HLD and obesity Has CRF with Cr around 1.8 ml/dl.  She has no documented vascular disease. She is somewhat sedentary but walks with some tightness in chest which may be related to dyspnea. Tightness is exertional. . She does not smoke She has never had stress test No history of TIA, claudication. Baseline Cr is 1.8 from HTN renal disease     She had nuc stress test which was normal. 05/2020  Today her BP is elevated and she notes it has been, she has PAC on EKG.  She was seen by her PCP group and for palpitations wore a monitor and an echo was done for murmur.  They were to send for appt today but we have not rec'd.  I do not have labs either, we will have office contact Dr. Trena Platt office to obtain.   She is to see endocrinology so possibility thyroid may be cause of palpitations.    No chest pain.  Her BP is elevated she no longer adds salt to food. She has hard heart beats.  It has been elevated at home also.    Past Medical History:  Diagnosis Date   Allergic rhinitis    Arthritis of both knees    Arthritis of both shoulder regions    Bilateral carpal tunnel syndrome    Cataract    Chronic low back pain    CKD (chronic kidney disease), stage III (HCC)    Decreased vision    Diabetes mellitus    HTN (hypertension)    Hyperlipidemia    Morbid obesity ([place])     Past Surgical History:  Procedure Laterality Date   CATARACT EXTRACTION     COLONOSCOPY     COLONOSCOPY N/A 09/15/2020   Procedure: COLONOSCOPY;  Surgeon: Daneil Dolin, MD;  [facility];  Service: Endoscopy;  Laterality: N/A;  ASA II /  PM procedure   POLYPECTOMY  09/15/2020   Procedure: POLYPECTOMY;  Surgeon: Daneil Dolin, MD;  Location: [facility];  Service: Endoscopy;;   TUBAL LIGATION       Current Outpatient Medications  Medication Sig Dispense Refill   acetaminophen (TYLENOL) 500 MG tablet Take 500-1,000 mg by mouth every 6 (six) hours as needed for mild pain.     amLODipine (NORVASC) 5 MG tablet Take 5 mg by mouth daily.     aspirin EC 81 MG tablet Take 81 mg by mouth daily.     atorvastatin (LIPITOR) 40 MG tablet Take 40 mg by mouth daily.     diclofenac Sodium (VOLTAREN) 1 % GEL Apply 2 g topically 4 (four) times daily. (Patient taking differently: Apply 2 g topically 4 (four) times daily as needed (pain).) 150 g 0   fluticasone (FLONASE) 50 MCG/ACT nasal spray Place 2 sprays into both nostrils daily as needed for allergies or rhinitis.     JARDIANCE 25 MG TABS tablet Take 25 mg by mouth daily.     lisinopril (ZESTRIL) 20 MG tablet Take 20 mg by mouth every morning.  meclizine (ANTIVERT) 25 MG tablet Take 25 mg by mouth 3 (three) times daily as needed for dizziness.     metFORMIN (GLUCOPHAGE-XR) 500 MG 24 hr tablet Take 500 mg by mouth daily.     nystatin (MYCOSTATIN/NYSTOP) powder Apply 1 application topically 2 (two) times daily as needed (yeast).     OVER THE COUNTER MEDICATION Take 1 tablet by mouth at bedtime as needed (leg cramps). legatrin pm     predniSONE (DELTASONE) 20 MG tablet Take 2 tablets (40 mg total) by mouth daily with breakfast. For the next four days (Patient not taking: Reported on 08/04/2022) 8 tablet 0   No current facility-administered medications for this visit.    Allergies:   Patient has no allergy information on record.    Social History:  The patient  reports that she has never smoked. She has never used smokeless tobacco. She reports that she does not drink alcohol and does not use drugs.   Family History:  The patient's family history includes Breast cancer in her mother;  Diabetes in her mother; Hypertension in her father and mother.    ROS:  General:no colds or fevers, some weight loss Skin:no rashes or ulcers HEENT:no blurred vision, no congestion CV:see HPI PUL:see HPI GI:no diarrhea constipation or melena, no indigestion GU:no hematuria, no dysuria MS:no joint pain, no claudication Neuro:no syncope, no lightheadedness Endo:+ diabetes, possible thyroid disease  Wt Readings from Last 3 Encounters:  08/04/22 175 lb (79.4 kg)  06/28/22 180 lb (81.6 kg)  10/14/20 195 lb (88.5 kg)     PHYSICAL EXAM: VS:  BP (!) 170/90   Pulse 80   Ht '5\' 1"'$  (1.549 m)   Wt 175 lb (79.4 kg)   SpO2 99%   BMI 33.07 kg/m  , BMI Body mass index is 33.07 kg/m. General:Pleasant affect, NAD Skin:Warm and dry, brisk capillary refill HEENT:normocephalic, sclera clear, mucus membranes moist Neck:supple, no JVD, no bruits  Heart:S1S2 RRR occ premature beat with soft murmur, gallup, rub or click Lungs:clear without rales, rhonchi, or wheezes [id number], non tender, + BS, do not palpate liver spleen or masses Ext:no lower ext edema, 2+ pedal pulses, 2+ radial pulses Neuro:alert and oriented X 3, MAE, follows commands, + facial symmetry    EKG:  EKG is ordered today. The ekg ordered today demonstrates SR with PAC short PR LVH no acute changes  stablew EKG I personally reviewed.     Recent Labs: 07/29/2022: ALT 16; BUN 13; Creatinine, Ser 0.90; Hemoglobin 11.3; Platelets 293; Potassium 3.5; Sodium 140    Lipid Panel    Component Value Date/Time   CHOL 166 02/09/2009 2026   TRIG 86 02/09/2009 2026   HDL 60 02/09/2009 2026   CHOLHDL 2.8 Ratio 02/09/2009 2026   VLDL 17 02/09/2009 2026   LDLCALC 89 02/09/2009 2026       Other studies Reviewed: Additional studies/ records that were reviewed today include: . Nuclear stress test 05/2020   Nuclear stress EF: 74%. The left ventricular ejection fraction is hyperdynamic (>65%). The patient walked on a standard [place]  protocol treadmill test for 4 minutes. She achieved a peak heart rate of 162 which is 110% predicted maximal heart rate. At peak exercise there were no ST or T wave changes to suggest ischemia. There was no QRS widening at peak exercise. This is a low risk study. The patient has fair exercise capacity. There is no evidence of infarction or ischemia.  ASSESSMENT AND PLAN:  1.  Palpitations, PAC on  EKG await event monitor results.    2.  HTN will add toprol XL 25 mg daily.  She will monitor her BP and we will have her follow up in a few weeks.    3.  Soft murmur, await echo results.   4.  DM per her PCP  5.  CKD - in past await labs.    Current medicines are reviewed with the patient today.  The patient Has no concerns regarding medicines.  The following changes have been made:  See above Labs/ tests ordered today include:see above  Disposition:   FU:  see above  Signed, Cecilie Kicks, NP  08/04/2022 2:24 PM    [facility] [address], [place], [place], [place] Phone: [phone]; Fax: [phone]

## 2022-08-04 ENCOUNTER — Ambulatory Visit: Payer: Medicare HMO | Attending: Cardiology | Admitting: Cardiology

## 2022-08-04 ENCOUNTER — Encounter: Payer: Self-pay | Admitting: Cardiology

## 2022-08-04 VITALS — BP 170/90 | HR 80 | Ht 61.0 in | Wt 175.0 lb

## 2022-08-04 DIAGNOSIS — I10 Essential (primary) hypertension: Secondary | ICD-10-CM | POA: Diagnosis not present

## 2022-08-04 DIAGNOSIS — R002 Palpitations: Secondary | ICD-10-CM

## 2022-08-04 DIAGNOSIS — E782 Mixed hyperlipidemia: Secondary | ICD-10-CM | POA: Diagnosis not present

## 2022-08-04 MED ORDER — METOPROLOL SUCCINATE ER 25 MG PO TB24: 25.0000 mg | ORAL_TABLET | Freq: Every day | ORAL | 3 refills | Status: DC

## 2022-08-04 NOTE — Patient Instructions (Signed)
Medication Instructions:   Start Toprol XL 25 mg Daily   *If you need a refill on your cardiac medications before your next appointment, please call your pharmacy*   Lab Work: NONE   If you have labs (blood work) drawn today and your tests are completely normal, you will receive your results only by: [facility] (if you have MyChart) OR A paper copy in the mail If you have any lab test that is abnormal or we need to change your treatment, we will call you to review the results.   Testing/Procedures: NONE    Follow-Up: At [facility], you and your health needs are our priority.  As part of our continuing mission to provide you with exceptional heart care, we have created designated Provider Care Teams.  These Care Teams include your primary Cardiologist (physician) and Advanced Practice Providers (APPs -  Physician Assistants and Nurse Practitioners) who all work together to provide you with the care you need, when you need it.  We recommend signing up for the patient portal called "MyChart".  Sign up information is provided on this After Visit Summary.  MyChart is used to connect with patients for Virtual Visits (Telemedicine).  Patients are able to view lab/test results, encounter notes, upcoming appointments, etc.  Non-urgent messages can be sent to your provider as well.   To learn more about what you can do with MyChart, go to [email or web address].    Your next appointment:   2 -3 week(s)week(s)  Provider:   Jenkins Rouge, MD    Other Instructions Thank you for choosing [facility]!

## 2022-08-07 ENCOUNTER — Encounter: Payer: Self-pay | Admitting: *Deleted

## 2022-08-07 ENCOUNTER — Ambulatory Visit: Payer: Self-pay | Admitting: Internal Medicine

## 2022-08-07 NOTE — [place] Notes (Signed)
Primary Physician/Referring:  Cipriano Mile, NP  Patient ID: Samantha [name], female    DOB: Jan 18, 1946, 77 y.o.   MRN: [id number]  Chief Complaint  Patient presents with   Chronic Kidney Disease   New Patient (Initial Visit)   HPI:    Samantha [name]  is a 77 y.o. NO SHOW  Past Medical History:  Diagnosis Date   Allergic rhinitis    Arthritis of both knees    Arthritis of both shoulder regions    Bilateral carpal tunnel syndrome    Cataract    Chronic low back pain    CKD (chronic kidney disease), stage III (HCC)    Decreased vision    Diabetes mellitus    HTN (hypertension)    Hyperlipidemia    Morbid obesity ([place])    Past Surgical History:  Procedure Laterality Date   CATARACT EXTRACTION     COLONOSCOPY     COLONOSCOPY N/A 09/15/2020   Procedure: COLONOSCOPY;  Surgeon: Daneil Dolin, MD;  [facility];  Service: Endoscopy;  Laterality: N/A;  ASA II / PM procedure   POLYPECTOMY  09/15/2020   Procedure: POLYPECTOMY;  Surgeon: Daneil Dolin, MD;  [facility];  Service: Endoscopy;;   TUBAL LIGATION     Family History  Problem Relation Age of Onset   Hypertension Mother    Diabetes Mother    Breast cancer Mother    Hypertension Father     Social History   Tobacco Use   Smoking status: Never   Smokeless tobacco: Never  Substance Use Topics   Alcohol use: No   Marital Status: Widowed  ROS  ROS Objective  There were no vitals taken for this visit. There is no height or weight on file to calculate BMI.     08/04/2022    2:48 PM 08/04/2022    2:08 PM 07/29/2022    4:08 PM  Vitals with BMI  Height  '5\' 1"'$    Weight  175 lbs   BMI  123456   Systolic [id number]  Diastolic 90 90 80  Pulse  80 90     Physical Exam  Medications and allergies  Not on File   Medication list after today's encounter   Current Outpatient Medications:    acetaminophen (TYLENOL) 500 MG tablet, Take 500-1,000 mg by mouth every 6 (six) hours as needed for mild  pain., Disp: , Rfl:    amLODipine (NORVASC) 5 MG tablet, Take 5 mg by mouth daily., Disp: , Rfl:    aspirin EC 81 MG tablet, Take 81 mg by mouth daily., Disp: , Rfl:    atorvastatin (LIPITOR) 40 MG tablet, Take 40 mg by mouth daily., Disp: , Rfl:    diclofenac Sodium (VOLTAREN) 1 % GEL, Apply 2 g topically 4 (four) times daily. (Patient taking differently: Apply 2 g topically 4 (four) times daily as needed (pain).), Disp: 150 g, Rfl: 0   fluticasone (FLONASE) 50 MCG/ACT nasal spray, Place 2 sprays into both nostrils daily as needed for allergies or rhinitis., Disp: , Rfl:    JARDIANCE 25 MG TABS tablet, Take 25 mg by mouth daily., Disp: , Rfl:    lisinopril (ZESTRIL) 20 MG tablet, Take 20 mg by mouth every morning., Disp: , Rfl:    meclizine (ANTIVERT) 25 MG tablet, Take 25 mg by mouth 3 (three) times daily as needed for dizziness., Disp: , Rfl:    metFORMIN (GLUCOPHAGE-XR) 500 MG 24 hr tablet, Take  500 mg by mouth daily., Disp: , Rfl:    metoprolol succinate (TOPROL XL) 25 MG 24 hr tablet, Take 1 tablet (25 mg total) by mouth daily., Disp: 90 tablet, Rfl: 3   nystatin (MYCOSTATIN/NYSTOP) powder, Apply 1 application topically 2 (two) times daily as needed (yeast)., Disp: , Rfl:    OVER THE COUNTER MEDICATION, Take 1 tablet by mouth at bedtime as needed (leg cramps). legatrin pm, Disp: , Rfl:    predniSONE (DELTASONE) 20 MG tablet, Take 2 tablets (40 mg total) by mouth daily with breakfast. For the next four days (Patient not taking: Reported on 08/04/2022), Disp: 8 tablet, Rfl: 0  Laboratory examination:   Lab Results  Component Value Date   NA 140 07/29/2022   K 3.5 07/29/2022   CO2 24 07/29/2022   GLUCOSE 148 (H) 07/29/2022   BUN 13 07/29/2022   CREATININE 0.90 07/29/2022   CALCIUM 9.5 07/29/2022   GFRNONAA >60 07/29/2022       Latest Ref Rng & Units 07/29/2022   11:20 AM 05/08/2018   10:47 PM 09/09/2017    7:30 PM  CMP  Glucose 70 - 99 mg/dL 148  125  244   BUN 8 - 23 mg/dL '13  14   18   '$ Creatinine 0.44 - 1.00 mg/dL 0.90  1.15  1.20   Sodium 135 - 145 mmol/L 140  140  135   Potassium 3.5 - 5.1 mmol/L 3.5  3.8  3.5   Chloride 98 - 111 mmol/L 108  108  100   CO2 22 - 32 mmol/L '24  23  23   '$ Calcium 8.9 - 10.3 mg/dL 9.5  9.2  9.7   Total Protein 6.5 - 8.1 g/dL 7.0  7.4  7.9   Total Bilirubin 0.3 - 1.2 mg/dL 0.7  0.6  1.3   Alkaline Phos 38 - 126 U/L 103  63  89   AST 15 - 41 U/L '15  19  27   '$ ALT 0 - 44 U/L '16  14  19       '$ Latest Ref Rng & Units 07/29/2022   11:20 AM 05/08/2018   10:47 PM 09/09/2017    7:30 PM  CBC  WBC 4.0 - 10.5 K/uL 6.9  7.6  7.8   Hemoglobin 12.0 - 15.0 g/dL 11.3  11.0  11.9   Hematocrit 36.0 - 46.0 % 36.6  37.7  39.2   Platelets 150 - 400 K/uL 293  270  287     Lipid Panel No results for input(s): "CHOL", "TRIG", "[place]", "VLDL", "HDL", "CHOLHDL", "LDLDIRECT" in the last 8760 hours.  HEMOGLOBIN A1C Lab Results  Component Value Date   HGBA1C 6.8 01/01/2009   TSH No results for input(s): "TSH" in the last 8760 hours.  External labs:   Labs 07/07/2022:  Glucose 162 BUN 12 creatinine 0.93 potassium 4.0 Hemoglobin 11.9 hematocrit 36.8 platelets 385 TSH 0.01 Free T4= 2.5   Radiology:    Cardiac Studies:     EKG:   08/07/2022:   Assessment     ICD-10-CM   1. CKD (chronic kidney disease) stage 4, GFR 15-29 ml/min (HCC)  N18.4 EKG 12-Lead    2. Essential hypertension  I10     3. Hypertensive heart disease without heart failure  I11.9     4. Type 2 diabetes mellitus without complication, with long-term current use of insulin (HCC)  E11.9    Z79.4        Orders Placed This Encounter  Procedures   EKG 12-Lead    No orders of the defined types were placed in this encounter.   There are no discontinued medications.   Recommendations:   Samantha [name] is a 77 y.o.      Floydene Flock, DO, [facility]  08/07/2022, 12:30 PM Office: [phone] Pager: [phone]

## 2022-08-09 ENCOUNTER — Encounter: Payer: Self-pay | Admitting: Student

## 2022-08-10 ENCOUNTER — Encounter: Payer: Self-pay | Admitting: Radiology

## 2022-08-25 ENCOUNTER — Ambulatory Visit: Payer: Medicare HMO | Admitting: Nurse Practitioner

## 2022-08-25 NOTE — Progress Notes (Deleted)
Office Visit    Patient Name: Samantha [name] Date of Encounter: 08/25/2022  PCP:  Samantha Mile, NP   [place]  Cardiologist:  Samantha Rouge, MD  Advanced Practice Provider:  Finis Bud, NP Electrophysiologist:  None 746}  Chief Complaint    Samantha [name] is a 77 y.o. female with a hx of palpitations, dizziness, T2DM, HTN, HLD, CKD, and obesity who presents today for 2-3 week follow-up.    Past Medical History    Past Medical History:  Diagnosis Date   Allergic rhinitis    Arthritis of both knees    Arthritis of both shoulder regions    Bilateral carpal tunnel syndrome    Cataract    Chronic low back pain    CKD (chronic kidney disease), stage III (HCC)    Decreased vision    Diabetes mellitus    HTN (hypertension)    Hyperlipidemia    Morbid obesity ([place])    Past Surgical History:  Procedure Laterality Date   CATARACT EXTRACTION     COLONOSCOPY     COLONOSCOPY N/A 09/15/2020   Procedure: COLONOSCOPY;  Surgeon: Samantha Dolin, MD;  [facility];  Service: Endoscopy;  Laterality: N/A;  ASA II / PM procedure   POLYPECTOMY  09/15/2020   Procedure: POLYPECTOMY;  Surgeon: Samantha Dolin, MD;  [facility];  Service: Endoscopy;;   TUBAL LIGATION      Allergies  Not on File  History of Present Illness    Samantha [name] is a 77 y.o. female with a PMH as mentioned above.     History of nuclear stress test-see results below.  Last seen by Samantha Kicks, NP on August 04, 2022.  Patient presented for evaluation for dizziness and palpitations.  Blood pressure was elevated this time.  Patient had previously had monitor and echo performed with PCP, report was not available at the time.  Would await monitor and echo results.  Today she presents for follow-up for blood pressure evaluation, as well as dizziness and palpitations evaluation.  She states  EKGs/Labs/Other Studies Reviewed:   The following studies were reviewed  today: ***  EKG:  EKG is *** ordered today.  The ekg ordered today demonstrates ***  Recent Labs: 07/29/2022: ALT 16; BUN 13; Creatinine, Ser 0.90; Hemoglobin 11.3; Platelets 293; Potassium 3.5; Sodium 140  Recent Lipid Panel    Component Value Date/Time   CHOL 166 02/09/2009 2026   TRIG 86 02/09/2009 2026   HDL 60 02/09/2009 2026   CHOLHDL 2.8 Ratio 02/09/2009 2026   VLDL 17 02/09/2009 2026   LDLCALC 89 02/09/2009 2026    Risk Assessment/Calculations:  {Does this patient have ATRIAL FIBRILLATION?:[id number]}  Home Medications   No outpatient medications have been marked as taking for the 08/25/22 encounter (Appointment) with Samantha Bud, NP.     Review of Systems   ***   All other systems reviewed and are otherwise negative except as noted above.  Physical Exam    VS:  There were no vitals taken for this visit. , BMI There is no height or weight on file to calculate BMI.  Wt Readings from Last 3 Encounters:  08/04/22 175 lb (79.4 kg)  06/28/22 180 lb (81.6 kg)  10/14/20 195 lb (88.5 kg)     GEN: Well nourished, well developed, in no acute distress. HEENT: normal. Neck: Supple, no JVD, carotid bruits, or masses. Cardiac: ***RRR, no murmurs, rubs, or gallops. No clubbing,  cyanosis, edema.  ***Radials/PT 2+ and equal bilaterally.  Respiratory:  ***Respirations regular and unlabored, clear to auscultation bilaterally. GI: Soft, nontender, nondistended. MS: No deformity or atrophy. Skin: Warm and dry, no rash. Neuro:  Strength and sensation are intact. Psych: Normal affect.  Assessment & Plan    ***  {Are you ordering a CV Procedure (e.g. stress test, cath, DCCV, TEE, etc)?   Press F2        :YC:[id number]      Disposition: Follow up {follow up:[id number]} with Samantha Rouge, MD or APP.  Signed, Samantha Bud, NP 08/25/2022, 8:33 AM [place]

## 2022-08-28 ENCOUNTER — Ambulatory Visit: Payer: Medicare HMO | Attending: Nurse Practitioner | Admitting: Nurse Practitioner

## 2022-08-28 ENCOUNTER — Encounter: Payer: Self-pay | Admitting: *Deleted

## 2022-08-28 ENCOUNTER — Encounter: Payer: Self-pay | Admitting: Nurse Practitioner

## 2022-08-28 VITALS — BP 146/70 | HR 84 | Ht 61.0 in | Wt 176.6 lb

## 2022-08-28 DIAGNOSIS — I10 Essential (primary) hypertension: Secondary | ICD-10-CM

## 2022-08-28 DIAGNOSIS — R002 Palpitations: Secondary | ICD-10-CM | POA: Diagnosis not present

## 2022-08-28 DIAGNOSIS — I491 Atrial premature depolarization: Secondary | ICD-10-CM

## 2022-08-28 NOTE — Progress Notes (Signed)
Office Visit    Patient Name: Samantha [name] Date of Encounter: 08/28/2022  PCP:  Cipriano Mile, NP   [place]  Cardiologist:  Jenkins Rouge, MD  Advanced Practice Provider:  Finis Bud, NP Electrophysiologist:  None 746}  Chief Complaint    Samantha [name] is a 77 y.o. female with a hx of palpitations, dizziness, T2DM, HTN, HLD, CKD, and obesity who presents today for 2-3 week follow-up.    Past Medical History    Past Medical History:  Diagnosis Date   Allergic rhinitis    Arthritis of both knees    Arthritis of both shoulder regions    Bilateral carpal tunnel syndrome    Cataract    Chronic low back pain    CKD (chronic kidney disease), stage III (HCC)    Decreased vision    Diabetes mellitus    HTN (hypertension)    Hyperlipidemia    Morbid obesity ([place])    Past Surgical History:  Procedure Laterality Date   CATARACT EXTRACTION     COLONOSCOPY     COLONOSCOPY N/A 09/15/2020   Procedure: COLONOSCOPY;  Surgeon: Daneil Dolin, MD;  [facility];  Service: Endoscopy;  Laterality: N/A;  ASA II / PM procedure   POLYPECTOMY  09/15/2020   Procedure: POLYPECTOMY;  Surgeon: Daneil Dolin, MD;  [facility];  Service: Endoscopy;;   TUBAL LIGATION      Allergies  Not on File  History of Present Illness    Samantha [name] is a 77 y.o. female with a PMH as mentioned above.     History of nuclear stress test-see results below.  Last seen by Cecilie Kicks, NP on August 04, 2022.  Patient presented for evaluation for dizziness and palpitations.  Blood pressure was elevated this time.  Patient had previously had monitor and echo performed with PCP, report was not available at the time.  Would await monitor and echo results.  Today she presents for follow-up for blood pressure evaluation, as well as dizziness and palpitations evaluation.  She states he is overall doing well. Continues to note palpitations, sensation of heart  "beating hard." Has not started taking Toprol XL and has weaned off tea. Denies any chest pain, shortness of breath, syncope, presyncope, dizziness, orthopnea, PND, swelling or significant weight changes, acute bleeding, or claudication.   EKGs/Labs/Other Studies Reviewed:   The following studies were reviewed today:   EKG:  EKG is not ordered today.   Myoview on 05/27/2020: Nuclear stress EF: 74%. The left ventricular ejection fraction is hyperdynamic (>65%). The patient walked on a standard Bruce protocol treadmill test for 4 minutes. She achieved a peak heart rate of 162 which is 110% predicted maximal heart rate. At peak exercise there were no ST or T wave changes to suggest ischemia. There was no QRS widening at peak exercise. This is a low risk study. The patient has fair exercise capacity. There is no evidence of infarction or ischemia.    Recent Labs: 07/29/2022: ALT 16; BUN 13; Creatinine, Ser 0.90; Hemoglobin 11.3; Platelets 293; Potassium 3.5; Sodium 140  Recent Lipid Panel    Component Value Date/Time   CHOL 166 02/09/2009 2026   TRIG 86 02/09/2009 2026   HDL 60 02/09/2009 2026   CHOLHDL 2.8 Ratio 02/09/2009 2026   VLDL 17 02/09/2009 2026   LDLCALC 89 02/09/2009 2026    Home Medications   Current Meds  Medication Sig   acetaminophen (  TYLENOL) 500 MG tablet Take 500-1,000 mg by mouth every 6 (six) hours as needed for mild pain.   amLODipine (NORVASC) 5 MG tablet Take 5 mg by mouth daily.   aspirin EC 81 MG tablet Take 81 mg by mouth daily.   atorvastatin (LIPITOR) 40 MG tablet Take 40 mg by mouth daily.   diclofenac Sodium (VOLTAREN) 1 % GEL Apply 2 g topically 4 (four) times daily. (Patient taking differently: Apply 2 g topically 4 (four) times daily as needed (pain).)   fluticasone (FLONASE) 50 MCG/ACT nasal spray Place 2 sprays into both nostrils daily as needed for allergies or rhinitis.   JARDIANCE 25 MG TABS tablet Take 25 mg by mouth daily.   lisinopril  (ZESTRIL) 20 MG tablet Take 20 mg by mouth every morning.   meclizine (ANTIVERT) 25 MG tablet Take 25 mg by mouth 3 (three) times daily as needed for dizziness.   metFORMIN (GLUCOPHAGE-XR) 500 MG 24 hr tablet Take 500 mg by mouth daily.   nystatin (MYCOSTATIN/NYSTOP) powder Apply 1 application topically 2 (two) times daily as needed (yeast).   OVER THE COUNTER MEDICATION Take 1 tablet by mouth at bedtime as needed (leg cramps). legatrin pm     Review of Systems    All other systems reviewed and are otherwise negative except as noted above.  Physical Exam    VS:  BP (!) 146/70   Pulse 84   Ht 5\' 1"  (1.549 m)   Wt 176 lb 9.6 oz (80.1 kg)   SpO2 97%   BMI 33.37 kg/m  , BMI Body mass index is 33.37 kg/m.  Wt Readings from Last 3 Encounters:  08/28/22 176 lb 9.6 oz (80.1 kg)  08/04/22 175 lb (79.4 kg)  06/28/22 180 lb (81.6 kg)     GEN: Well nourished, well developed, in no acute distress. HEENT: normal. Neck: Supple, no JVD, carotid bruits, or masses. Cardiac: S1/S2 with occasional early beat noted; no murmurs, rubs, or gallops. No clubbing, cyanosis, edema.  Radials/PT 2+ and equal bilaterally.  Respiratory:  Respirations regular and unlabored, clear to auscultation bilaterally. MS: No deformity or atrophy. Skin: Warm and dry, no rash. Neuro:  Strength and sensation are intact. Psych: Normal affect.  Assessment & Plan    Palpitations, PAC Improvement in symptoms, but still having sensation of heart "beating hard." Still awaiting results and discussed will reach out to PCP with results ASAP.  Continue current medication regimen and discussed okay to take Toprol XL to improve her symptoms. Heart healthy diet and regular cardiovascular exercise encouraged. Conservative measures discussed.   HTN BP on arrival 146/70, repeat BP 142/82. Continue amlodipine, lisinopril, and discussed to start Toprol XL. She verbalized understanding. Discussed to monitor BP at home at least 2 hours  after medications and sitting for 5-10 minutes. Have room to increase dosages of BP medications if needed in future. Heart healthy diet and regular cardiovascular exercise encouraged.    Disposition: Follow up in 3-4 week(s) with Jenkins Rouge, MD or APP.  Signed, Finis Bud, NP 08/28/2022, 9:41 AM [place]

## 2022-08-28 NOTE — Patient Instructions (Signed)
Medication Instructions:  Continue all current medications.  Labwork: none  Testing/Procedures: none  Follow-Up: 1 month   Any Other Special Instructions Will Be Listed Below (If Applicable).  If you need a refill on your cardiac medications before your next appointment, please call your pharmacy.  

## 2022-09-05 ENCOUNTER — Encounter: Payer: Self-pay | Admitting: "Endocrinology

## 2022-09-08 ENCOUNTER — Other Ambulatory Visit: Payer: Self-pay

## 2022-09-08 ENCOUNTER — Emergency Department (HOSPITAL_COMMUNITY)
Admission: EM | Admit: 2022-09-08 | Discharge: 2022-09-08 | Disposition: A | Discharge status: Home / Self Care | Payer: Medicare HMO | Attending: Emergency Medicine | Admitting: Emergency Medicine

## 2022-09-08 ENCOUNTER — Encounter (HOSPITAL_COMMUNITY): Payer: Self-pay

## 2022-09-08 DIAGNOSIS — Z79899 Other long term (current) drug therapy: Secondary | ICD-10-CM | POA: Diagnosis not present

## 2022-09-08 DIAGNOSIS — I10 Essential (primary) hypertension: Secondary | ICD-10-CM | POA: Diagnosis present

## 2022-09-08 DIAGNOSIS — Z7982 Long term (current) use of aspirin: Secondary | ICD-10-CM | POA: Diagnosis not present

## 2022-09-08 DIAGNOSIS — Z7984 Long term (current) use of oral hypoglycemic drugs: Secondary | ICD-10-CM | POA: Diagnosis not present

## 2022-09-08 DIAGNOSIS — E119 Type 2 diabetes mellitus without complications: Secondary | ICD-10-CM | POA: Insufficient documentation

## 2022-09-08 LAB — CBC WITH DIFFERENTIAL/PLATELET
Abs Immature Granulocytes: 0.02 10*3/uL (ref 0.00–0.07)
Basophils Absolute: 0 10*3/uL (ref 0.0–0.1)
Basophils Relative: 1 %
Eosinophils Absolute: 0.1 10*3/uL (ref 0.0–0.5)
Eosinophils Relative: 1 %
HCT: 35 % — ABNORMAL LOW (ref 36.0–46.0)
Hemoglobin: 10.5 g/dL — ABNORMAL LOW (ref 12.0–15.0)
Immature Granulocytes: 0 %
Lymphocytes Relative: 43 %
Lymphs Abs: 2.8 10*3/uL (ref 0.7–4.0)
MCH: 25.2 pg — ABNORMAL LOW (ref 26.0–34.0)
MCHC: 30 g/dL (ref 30.0–36.0)
MCV: 84.1 fL (ref 80.0–100.0)
Monocytes Absolute: 0.5 10*3/uL (ref 0.1–1.0)
Monocytes Relative: 8 %
Neutro Abs: 3 10*3/uL (ref 1.7–7.7)
Neutrophils Relative %: 47 %
Platelets: 344 10*3/uL (ref 150–400)
RBC: 4.16 MIL/uL (ref 3.87–5.11)
RDW: 14.9 % (ref 11.5–15.5)
WBC: 6.4 10*3/uL (ref 4.0–10.5)
nRBC: 0 % (ref 0.0–0.2)

## 2022-09-08 LAB — TROPONIN I (HIGH SENSITIVITY): Troponin I (High Sensitivity): 6 ng/L (ref <18)

## 2022-09-08 LAB — BASIC METABOLIC PANEL
Anion gap: 6 (ref 5–15)
BUN: 11 mg/dL (ref 8–23)
CO2: 24 mmol/L (ref 22–32)
Calcium: 8.6 mg/dL — ABNORMAL LOW (ref 8.9–10.3)
Chloride: 108 mmol/L (ref 98–111)
Creatinine, Ser: 0.88 mg/dL (ref 0.44–1.00)
GFR, Estimated: >60 mL/min (ref >60)
Glucose, Bld: 112 mg/dL — ABNORMAL HIGH (ref 70–99)
Potassium: 3.3 mmol/L — ABNORMAL LOW (ref 3.5–5.1)
Sodium: 138 mmol/L (ref 135–145)

## 2022-09-08 NOTE — ED Triage Notes (Signed)
Pt has been out of Norvasc.  Got today and took one

## 2022-09-08 NOTE — ED Provider Notes (Signed)
[facility] Provider Note   CSN: [id number] Arrival date & time: 09/08/22  1743     History  Chief Complaint  Patient presents with   Hypertension    Samantha [name] is a 77 y.o. female.   Hypertension     Patient with medical Struve hypertension, lipidemia, diabetes presents to the emergency department due to high blood pressure.  States she was off her Norvasc for some number of days to weeks, she is unsure how long.  She is on that, lisinopril and s/p retaking a beta-blocker but has not started taking it yet.  She is asymptomatic, came to the ED because her blood pressure was elevated to [id number] systolically at home and she was concerned.  She denies any headache prior to arrival, chest pain, shortness of breath, lower extremity swelling.  No other changes in medication.  No double vision, loss of vision, lateralized weakness or numbness.  Home Medications Prior to Admission medications   Medication Sig Start Date End Date Taking? Authorizing Provider  acetaminophen (TYLENOL) 500 MG tablet Take 500-1,000 mg by mouth every 6 (six) hours as needed for mild pain.    [provider]  amLODipine (NORVASC) 5 MG tablet Take 5 mg by mouth daily.    [provider]  aspirin EC 81 MG tablet Take 81 mg by mouth daily.    [provider]  atorvastatin (LIPITOR) 40 MG tablet Take 40 mg by mouth daily.    [provider]  diclofenac Sodium (VOLTAREN) 1 % GEL Apply 2 g topically 4 (four) times daily. Patient taking differently: Apply 2 g topically 4 (four) times daily as needed (pain). 08/07/21   Volney American, PA-C  fluticasone [facility]) 50 MCG/ACT nasal spray Place 2 sprays into both nostrils daily as needed for allergies or rhinitis.    [provider]  JARDIANCE 25 MG TABS tablet Take 25 mg by mouth daily. 07/20/22   [provider]  lisinopril (ZESTRIL) 20 MG tablet Take 20 mg by mouth every  morning. 07/20/22   [provider]  meclizine (ANTIVERT) 25 MG tablet Take 25 mg by mouth 3 (three) times daily as needed for dizziness.    [provider]  metFORMIN (GLUCOPHAGE-XR) 500 MG 24 hr tablet Take 500 mg by mouth daily. 07/18/22   [provider]  metoprolol succinate (TOPROL XL) 25 MG 24 hr tablet Take 1 tablet (25 mg total) by mouth daily. Patient not taking: Reported on 08/28/2022 08/04/22   Isaiah Serge, NP  nystatin (MYCOSTATIN/NYSTOP) powder Apply 1 application topically 2 (two) times daily as needed (yeast).    [provider]  OVER THE COUNTER MEDICATION Take 1 tablet by mouth at bedtime as needed (leg cramps). legatrin pm    [provider]      Allergies    Patient has no known allergies.    Review of Systems   Review of Systems  Physical Exam Updated Vital Signs BP (!) 180/110 (BP Location: Right Arm)   Pulse 81   Temp 98.1 F (36.7 C) (Temporal)   Resp 18   Ht 5\' 1"  (1.549 m)   Wt 77.1 kg   SpO2 99%   BMI 32.12 kg/m  Physical Exam Vitals and nursing note reviewed. Exam conducted with a chaperone present.  Constitutional:      General: She is not in acute distress.    Appearance: Normal appearance.  HENT:     Head:  Normocephalic and atraumatic.  Eyes:     General: No scleral icterus.       Right eye: No discharge.        Left eye: No discharge.     Extraocular Movements: Extraocular movements intact.     Pupils: Pupils are equal, round, and reactive to light.  Cardiovascular:     Rate and Rhythm: Normal rate and regular rhythm.     Pulses: Normal pulses.     Heart sounds: Normal heart sounds. No murmur heard.    No friction rub. No gallop.  Pulmonary:     Effort: Pulmonary effort is normal. No respiratory distress.     Breath sounds: Normal breath sounds.  Abdominal:     General: Abdomen is flat. Bowel sounds are normal. There is no distension.     Palpations: Abdomen is soft.     Tenderness: There is  no abdominal tenderness.  Skin:    General: Skin is warm and dry.     Coloration: Skin is not jaundiced.  Neurological:     Mental Status: She is alert. Mental status is at baseline.     Coordination: Coordination normal.     ED Results / Procedures / Treatments   Labs (all labs ordered are listed, but only abnormal results are displayed) Labs Reviewed  BASIC METABOLIC PANEL  CBC WITH DIFFERENTIAL/PLATELET  TROPONIN I (HIGH SENSITIVITY)    EKG None  Radiology No results found.  Procedures Procedures    Medications Ordered in ED Medications - No data to display  ED Course/ Medical Decision Making/ A&P                             Medical Decision Making Amount and/or Complexity of Data Reviewed Labs: ordered.   Patient presents due to asymptomatic hypertension.  Differential includes hypertensive emergency, renal impairment, ACS, SAH.  Given she is asymptomatic lower suspicion for [facility], she is not having any focal deficits on neuroexam, do not think this is a TIA or CVA either.  Upper and lower extremity pulses are symmetric, no back pain, low suspicion for dissection.  Will check basic labs including renal function and troponin in case of elevation given she was significantly hypertensive upon arrival.  Laboratory workup ordered and viewed by myself, negative for AKI, no renal impairment.  Troponin is low, do not think this is ACS.  Patient is stable and asymptomatic, will have her follow-up with her PCP.  Encouraged to continue taking her home blood pressure medications.  Stable for discharge at this time.        Final Clinical Impression(s) / ED Diagnoses Final diagnoses:  None    Rx / DC Orders ED Discharge Orders     None         Sherrill Raring, Hershal Coria 09/08/22 2155    Milton Ferguson, MD 09/09/22 1149

## 2022-09-08 NOTE — ED Triage Notes (Signed)
States BP running high today

## 2022-09-08 NOTE — Discharge Instructions (Signed)
Continue taking your home blood pressure medicines.  Call your primary with no you are seen in the ED.  Return to the ED if you have sudden onset worst headache of life, loss of vision, weakness one-sided your body, chest pain, shortness of breath, new or concerning symptoms.

## 2022-09-28 ENCOUNTER — Ambulatory Visit: Payer: Medicare HMO | Admitting: Nurse Practitioner

## 2022-09-28 NOTE — Progress Notes (Deleted)
Office Visit    Patient Name: Samantha [name] Date of Encounter: 09/28/2022  PCP:  Hillery Aldo, NP   [facility]  Cardiologist:  Charlton Haws, MD  Advanced Practice Provider:  Sharlene Dory, NP Electrophysiologist:  None 746}  Chief Complaint    Samantha [name] is a 77 y.o. female with a hx of palpitations, dizziness, T2DM, HTN, HLD, CKD, and obesity who presents today for 2-3 week follow-up.    Past Medical History    Past Medical History:  Diagnosis Date   Allergic rhinitis    Arthritis of both knees    Arthritis of both shoulder regions    Bilateral carpal tunnel syndrome    Cataract    Chronic low back pain    CKD (chronic kidney disease), stage III (HCC)    Decreased vision    Diabetes mellitus    HTN (hypertension)    Hyperlipidemia    Morbid obesity (HCC)    Past Surgical History:  Procedure Laterality Date   CATARACT EXTRACTION     COLONOSCOPY     COLONOSCOPY N/A 09/15/2020   Procedure: COLONOSCOPY;  Surgeon: Corbin Ade, MD;  [facility];  Service: Endoscopy;  Laterality: N/A;  ASA II / PM procedure   POLYPECTOMY  09/15/2020   Procedure: POLYPECTOMY;  Surgeon: Corbin Ade, MD;  [facility];  Service: Endoscopy;;   TUBAL LIGATION      Allergies  No Known Allergies  History of Present Illness    Samantha [name] is a 77 y.o. female with a PMH as mentioned above.     History of nuclear stress test-see results below.  Last seen by Nada Boozer, NP on August 04, 2022.  Patient presented for evaluation for dizziness and palpitations.  Blood pressure was elevated this time.  Patient had previously had monitor and echo performed with PCP, report was not available at the time.  Would await monitor and echo results.  Today she presents for follow-up for blood pressure evaluation, as well as dizziness and palpitations evaluation.  She states he is overall doing well. Continues to note palpitations, sensation of  heart "beating hard." Has not started taking Toprol XL and has weaned off tea. Denies any chest pain, shortness of breath, syncope, presyncope, dizziness, orthopnea, PND, swelling or significant weight changes, acute bleeding, or claudication.   EKGs/Labs/Other Studies Reviewed:   The following studies were reviewed today:   EKG:  EKG is not ordered today.   Myoview on 05/27/2020: Nuclear stress EF: 74%. The left ventricular ejection fraction is hyperdynamic (>65%). The patient walked on a standard Bruce protocol treadmill test for 4 minutes. She achieved a peak heart rate of 162 which is 110% predicted maximal heart rate. At peak exercise there were no ST or T wave changes to suggest ischemia. There was no QRS widening at peak exercise. This is a low risk study. The patient has fair exercise capacity. There is no evidence of infarction or ischemia.    Recent Labs: 07/29/2022: ALT 16 09/08/2022: BUN 11; Creatinine, Ser 0.88; Hemoglobin 10.5; Platelets 344; Potassium 3.3; Sodium 138  Recent Lipid Panel    Component Value Date/Time   CHOL 166 02/09/2009 2026   TRIG 86 02/09/2009 2026   HDL 60 02/09/2009 2026   CHOLHDL 2.8 Ratio 02/09/2009 2026   VLDL 17 02/09/2009 2026   LDLCALC 89 02/09/2009 2026    Home Medications   No outpatient medications have been marked as  taking for the 09/28/22 encounter (Appointment) with Sharlene Dory, NP.     Review of Systems    All other systems reviewed and are otherwise negative except as noted above.  Physical Exam    VS:  There were no vitals taken for this visit. , BMI There is no height or weight on file to calculate BMI.  Wt Readings from Last 3 Encounters:  09/08/22 170 lb (77.1 kg)  08/28/22 176 lb 9.6 oz (80.1 kg)  08/04/22 175 lb (79.4 kg)     GEN: Well nourished, well developed, in no acute distress. HEENT: normal. Neck: Supple, no JVD, carotid bruits, or masses. Cardiac: S1/S2 with occasional early beat noted; no murmurs,  rubs, or gallops. No clubbing, cyanosis, edema.  Radials/PT 2+ and equal bilaterally.  Respiratory:  Respirations regular and unlabored, clear to auscultation bilaterally. MS: No deformity or atrophy. Skin: Warm and dry, no rash. Neuro:  Strength and sensation are intact. Psych: Normal affect.  Assessment & Plan    Palpitations, PAC Improvement in symptoms, but still having sensation of heart "beating hard." Still awaiting results and discussed will reach out to PCP with results ASAP.  Continue current medication regimen and discussed okay to take Toprol XL to improve her symptoms. Heart healthy diet and regular cardiovascular exercise encouraged. Conservative measures discussed.   HTN BP on arrival 146/70, repeat BP 142/82. Continue amlodipine, lisinopril, and discussed to start Toprol XL. She verbalized understanding. Discussed to monitor BP at home at least 2 hours after medications and sitting for 5-10 minutes. Have room to increase dosages of BP medications if needed in future. Heart healthy diet and regular cardiovascular exercise encouraged.    Disposition: Follow up in 3-4 week(s) with Charlton Haws, MD or APP.  Signed, Sharlene Dory, NP 09/28/2022, 8:08 AM [facility]

## 2022-10-05 ENCOUNTER — Ambulatory Visit: Payer: Medicare HMO | Admitting: "Endocrinology

## 2022-10-05 ENCOUNTER — Encounter: Payer: Self-pay | Admitting: "Endocrinology

## 2022-10-05 VITALS — BP 124/64 | HR 80 | Ht 61.0 in | Wt 177.0 lb

## 2022-10-05 DIAGNOSIS — I10 Essential (primary) hypertension: Secondary | ICD-10-CM | POA: Diagnosis not present

## 2022-10-05 DIAGNOSIS — E782 Mixed hyperlipidemia: Secondary | ICD-10-CM | POA: Diagnosis not present

## 2022-10-05 DIAGNOSIS — E0590 Thyrotoxicosis, unspecified without thyrotoxic crisis or storm: Secondary | ICD-10-CM | POA: Diagnosis not present

## 2022-10-05 DIAGNOSIS — E119 Type 2 diabetes mellitus without complications: Secondary | ICD-10-CM | POA: Diagnosis not present

## 2022-10-05 NOTE — Progress Notes (Signed)
Endocrinology Consult Note                                            10/05/2022, 3:04 PM   Subjective:    Patient ID: Samantha [name], female    DOB: 30-Nov-1945, PCP Hillery Aldo, NP   Past Medical History:  Diagnosis Date   Allergic rhinitis    Arthritis of both knees    Arthritis of both shoulder regions    Bilateral carpal tunnel syndrome    Cataract    Chronic low back pain    CKD (chronic kidney disease), stage III    Decreased vision    Diabetes mellitus    HTN (hypertension)    Hyperlipidemia    Morbid obesity    Past Surgical History:  Procedure Laterality Date   CATARACT EXTRACTION     COLONOSCOPY     COLONOSCOPY N/A 09/15/2020   Procedure: COLONOSCOPY;  Surgeon: Corbin Ade, MD;  [facility];  Service: Endoscopy;  Laterality: N/A;  ASA II / PM procedure   POLYPECTOMY  09/15/2020   Procedure: POLYPECTOMY;  Surgeon: Corbin Ade, MD;  [facility];  Service: Endoscopy;;   TUBAL LIGATION     Social History   Socioeconomic History   Marital status: Widowed    Spouse name: Not on file   Number of children: Not on file   Years of education: Not on file   Highest education level: Not on file  Occupational History   Not on file  Tobacco Use   Smoking status: Never   Smokeless tobacco: Never  Vaping Use   Vaping Use: Never used  Substance and Sexual Activity   Alcohol use: Yes    Comment: occasionally   Drug use: No   Sexual activity: Yes    Birth control/protection: Surgical  Other Topics Concern   Not on file  Social History Narrative   Not on file   Social Determinants of Health   Financial Resource Strain: Not on file  Food Insecurity: Not on file  Transportation Needs: Not on file  Physical Activity: Not on file  Stress: Not on file  Social Connections: Not on file   Family History  Problem Relation Age of Onset   Hypertension Mother    Diabetes Mother    Breast cancer Mother    Hypertension Father     Outpatient Encounter Medications as of 10/05/2022  Medication Sig   acetaminophen (TYLENOL) 500 MG tablet Take 500-1,000 mg by mouth every 6 (six) hours as needed for mild pain.   amLODipine (NORVASC) 10 MG tablet Take 10 mg by mouth daily.   aspirin EC 81 MG tablet Take 81 mg by mouth daily.   atorvastatin (LIPITOR) 40 MG tablet Take 40 mg by mouth daily. (Patient not taking: Reported on 09/08/2022)   diclofenac Sodium (VOLTAREN) 1 % GEL Apply 2 g topically 4 (four) times daily. (Patient taking differently: Apply 2 g topically 4 (four) times daily as needed (pain).)   fluticasone (FLONASE) 50 MCG/ACT nasal spray Place 2 sprays into both nostrils daily as needed for allergies or rhinitis.   JARDIANCE 25 MG TABS tablet Take 25 mg by mouth daily.   lisinopril (ZESTRIL) 20 MG tablet Take 20 mg by mouth every morning.   meclizine (ANTIVERT) 25 MG tablet Take 25 mg by  mouth 3 (three) times daily as needed for dizziness.   metFORMIN (GLUCOPHAGE-XR) 500 MG 24 hr tablet Take 500 mg by mouth daily.   metoprolol succinate (TOPROL XL) 25 MG 24 hr tablet Take 1 tablet (25 mg total) by mouth daily. (Patient not taking: Reported on 08/28/2022)   nystatin (MYCOSTATIN/NYSTOP) powder Apply 1 application  topically 2 (two) times daily as needed (yeast).   OVER THE COUNTER MEDICATION Take 1 tablet by mouth at bedtime as needed (leg cramps). legatrin pm   No facility-administered encounter medications on file as of 10/05/2022.   ALLERGIES: No Known Allergies  VACCINATION STATUS: Immunization History  Administered Date(s) Administered   H1N1 06/30/2008   Influenza Whole 03/12/2006, 06/03/2007   Moderna Sars-Covid-2 Vaccination 07/20/2019, 08/20/2019   Pneumococcal Polysaccharide-23 01/22/2007    HPI Samantha [name] is 77 y.o. female who presents today with a medical history as above. she is being seen in consultation for hyperthyroidism requested by Hillery Aldo, NP.  History is obtained directly from the  patient and chart review.  Patient reports that she was informed of hyperthyroidism several weeks ago.  She reports 30 pounds of weight loss over 6 months, intermittent palpitations, tremors, and heat intolerance.  She is not on antithyroid intervention at this time.  She denies family history of thyroid problems.  She does not have time family history of thyroid malignancy. She does not have any kind of thyroid imaging yet.  She does not have recent thyroid function test to review. In January 2024, she did have suppressed TSH and elevated free T4-see below. Her other medical problems include hypertension on treatment with amlodipine and lisinopril.  She is also on Jardiance and metformin for type 2 diabetes.  Her most recent A1c was 6.8%.  She has Lipitor 40 mg p.o. nightly for hyperlipidemia.  Review of Systems  Constitutional: +mildly fluctuating body weight with recent weight  loss, no fatigue, no subjective hyperthermia, no subjective hypothermia Eyes: no blurry vision, no xerophthalmia ENT: no sore throat, no nodules palpated in throat, no dysphagia/odynophagia, no hoarseness Cardiovascular: no Chest Pain, no Shortness of Breath, no palpitations, no leg swelling Respiratory: no cough, no shortness of breath Gastrointestinal: no Nausea/Vomiting/Diarhhea Musculoskeletal: no muscle/joint aches Skin: no rashes Neurological: no tremors, no numbness, no tingling, no dizziness Psychiatric: no depression, no anxiety  Objective:       10/05/2022    1:31 PM 09/08/2022   10:00 PM 09/08/2022    9:30 PM  Vitals with BMI  Height 5\' 1"     Weight 177 lbs    BMI 33.46    Systolic 124 165 161  Diastolic 64 81 81  Pulse 80 78 80    BP 124/64   Pulse 80   Ht 5\' 1"  (1.549 m)   Wt 177 lb (80.3 kg)   BMI 33.44 kg/m   Wt Readings from Last 3 Encounters:  10/05/22 177 lb (80.3 kg)  09/08/22 170 lb (77.1 kg)  08/28/22 176 lb 9.6 oz (80.1 kg)    Physical Exam  Constitutional:  Body mass  index is 33.44 kg/m.,  not in acute distress, + anxious state of mind.   Eyes: PERRLA, EOMI, no exophthalmos ENT: moist mucous membranes, +gross thyromegaly, no gross cervical lymphadenopathy Cardiovascular: normal precordial activity, Regular Rate and Rhythm, no Murmur/Rubs/Gallops Respiratory:  adequate breathing efforts, no gross chest deformity, Clear to auscultation bilaterally Gastrointestinal: abdomen soft, Non -tender, No distension, Bowel Sounds present, no gross organomegaly Musculoskeletal: no gross deformities, strength intact in  all four extremities, no peripheral edema Skin: moist, warm, no rashes Neurological: no tremor with outstretched hands, Deep tendon reflexes normal in bilateral lower extremities.  CMP ( most recent) CMP     Component Value Date/Time   NA 138 09/08/2022 2104   K 3.3 (L) 09/08/2022 2104   CL 108 09/08/2022 2104   CO2 24 09/08/2022 2104   GLUCOSE 112 (H) 09/08/2022 2104   BUN 11 09/08/2022 2104   CREATININE 0.88 09/08/2022 2104   CALCIUM 8.6 (L) 09/08/2022 2104   PROT 7.0 07/29/2022 1120   ALBUMIN 3.5 07/29/2022 1120   AST 15 07/29/2022 1120   ALT 16 07/29/2022 1120   ALKPHOS 103 07/29/2022 1120   BILITOT 0.7 07/29/2022 1120   GFRNONAA >60 09/08/2022 2104   GFRAA 55 (L) 05/08/2018 2247     Diabetic Labs (most recent): Lab Results  Component Value Date   HGBA1C 6.8 01/01/2009   HGBA1C 7.0 10/02/2008   HGBA1C 7.2 06/30/2008   MICROALBUR 1.54 02/09/2009   MICROALBUR 0.64 01/16/2008   MICROALBUR 1.69 12/03/2006     Lipid Panel ( most recent) Lipid Panel     Component Value Date/Time   CHOL 166 02/09/2009 2026   TRIG 86 02/09/2009 2026   HDL 60 02/09/2009 2026   CHOLHDL 2.8 Ratio 02/09/2009 2026   VLDL 17 02/09/2009 2026   LDLCALC 89 02/09/2009 2026      Lab Results  Component Value Date   TSH 1.527 02/09/2009   TSH 1.318 01/16/2008   TSH 1.499 12/03/2006     Assessment & Plan:   1. Hyperthyroidism  - Samantha [name]  is  being seen at a kind request of Hillery Aldo, NP. - I have reviewed her available thyroid records and clinically evaluated the patient. - Based on these reviews, she has clinical goiter, evidence of hypothyroidism,  however,  there is not sufficient information to proceed with definitive treatment plan.  - she will need a repeat,  more complete thyroid function tests and thyroid uptake and scan towards confirming the diagnosis.  -she will return in 2 week to review her repeat labs.   If her  labs are suggestive of primary hypothyroidism, she will be considered for treatment options including radioactive iodine thyroid ablation.   In light of her metabolic dysfunction indicated by type 2 diabetes, hypertension, hyperlipidemia, she is advised to continue her current medications including metformin 500 mg XR p.o. daily, Jardiance 25 mg p.o. daily.  She is also advised to continue Lipitor 40 mg p.o. nightly for hyperlipidemia and amlodipine and lisinopril for hypertension.  She is advised to maintain close follow-up with her PCP.   - she is advised to maintain close follow up with Hillery Aldo, NP for primary care needs.   - Time spent with the patient: 45 minutes, of which >50% was spent in  counseling her about her hypothyroidism and the rest in obtaining information about her symptoms, reviewing her previous labs/studies ( including abstractions from other facilities),  evaluations, and treatments,  and developing a plan to confirm diagnosis and long term treatment based on the latest standards of care/guidelines; and documenting her care.  Samantha [name] participated in the discussions, expressed understanding, and voiced agreement with the above plans.  All questions were answered to her satisfaction. she is encouraged to contact clinic should she have any questions or concerns prior to her return visit.  Follow up plan: Return in about 2 weeks (around 10/19/2022) for Labs Today- Non-Fasting Ok,  F/U  with Thyroid Uptake and Scan.   Marquis Lunch, MD [facility] [address] [place], [place] [address] Phone: [phone]  Fax: [phone]     10/05/2022, 3:04 PM  This note was partially dictated with voice recognition software. Similar sounding words can be transcribed inadequately or may not  be corrected upon review.

## 2022-10-06 ENCOUNTER — Other Ambulatory Visit: Payer: Self-pay | Admitting: "Endocrinology

## 2022-10-06 DIAGNOSIS — E119 Type 2 diabetes mellitus without complications: Secondary | ICD-10-CM | POA: Insufficient documentation

## 2022-10-09 LAB — TSH: TSH: <0.01 mIU/L — ABNORMAL LOW (ref 0.40–4.50)

## 2022-10-09 LAB — THYROID PEROXIDASE ANTIBODY: Thyroperoxidase Ab SerPl-aCnc: 1 IU/mL (ref <9)

## 2022-10-09 LAB — T3, FREE: T3, Free: 7.3 pg/mL — ABNORMAL HIGH (ref 2.3–4.2)

## 2022-10-09 LAB — T4, FREE: Free T4: 2.6 ng/dL — ABNORMAL HIGH (ref 0.8–1.8)

## 2022-10-09 LAB — THYROGLOBULIN ANTIBODY: Thyroglobulin Ab: <1 IU/mL (ref <=1)

## 2022-10-19 ENCOUNTER — Encounter (HOSPITAL_COMMUNITY)
Admission: RE | Admit: 2022-10-19 | Discharge: 2022-10-19 | Disposition: A | Discharge status: Home / Self Care | Payer: Medicare HMO | Source: Ambulatory Visit | Attending: "Endocrinology | Admitting: "Endocrinology

## 2022-10-19 DIAGNOSIS — E0590 Thyrotoxicosis, unspecified without thyrotoxic crisis or storm: Secondary | ICD-10-CM | POA: Diagnosis present

## 2022-10-19 MED ORDER — SODIUM IODIDE I-123 7.4 MBQ CAPS
442.0000 | ORAL_CAPSULE | Freq: Once | ORAL | Status: AC
  Administered 2022-10-19: 442 via ORAL

## 2022-10-20 ENCOUNTER — Encounter (HOSPITAL_COMMUNITY)
Admission: RE | Admit: 2022-10-20 | Discharge: 2022-10-20 | Disposition: A | Discharge status: Home / Self Care | Payer: Medicare HMO | Source: Ambulatory Visit | Attending: "Endocrinology | Admitting: "Endocrinology

## 2022-11-03 ENCOUNTER — Ambulatory Visit: Payer: Medicare HMO | Admitting: "Endocrinology

## 2022-11-03 ENCOUNTER — Encounter: Payer: Self-pay | Admitting: "Endocrinology

## 2022-11-03 VITALS — BP 142/78 | HR 84 | Ht 61.0 in | Wt 172.4 lb

## 2022-11-03 DIAGNOSIS — Z7984 Long term (current) use of oral hypoglycemic drugs: Secondary | ICD-10-CM

## 2022-11-03 DIAGNOSIS — E119 Type 2 diabetes mellitus without complications: Secondary | ICD-10-CM

## 2022-11-03 DIAGNOSIS — E0520 Thyrotoxicosis with toxic multinodular goiter without thyrotoxic crisis or storm: Secondary | ICD-10-CM | POA: Diagnosis not present

## 2022-11-03 DIAGNOSIS — E0590 Thyrotoxicosis, unspecified without thyrotoxic crisis or storm: Secondary | ICD-10-CM

## 2022-11-03 DIAGNOSIS — E782 Mixed hyperlipidemia: Secondary | ICD-10-CM

## 2022-11-03 DIAGNOSIS — I10 Essential (primary) hypertension: Secondary | ICD-10-CM | POA: Diagnosis not present

## 2022-11-03 NOTE — Progress Notes (Signed)
11/03/2022, 10:27 AM   Endocrinology follow-up note  Subjective:    Patient ID: Samantha [name], female    DOB: 1945/10/08, PCP Hillery Aldo, NP   Past Medical History:  Diagnosis Date   Allergic rhinitis    Arthritis of both knees    Arthritis of both shoulder regions    Bilateral carpal tunnel syndrome    Cataract    Chronic low back pain    CKD (chronic kidney disease), stage III (HCC)    Decreased vision    Diabetes mellitus    HTN (hypertension)    Hyperlipidemia    Morbid obesity (HCC)    Past Surgical History:  Procedure Laterality Date   CATARACT EXTRACTION     COLONOSCOPY     COLONOSCOPY N/A 09/15/2020   Procedure: COLONOSCOPY;  Surgeon: Corbin Ade, MD;  [facility];  Service: Endoscopy;  Laterality: N/A;  ASA II / PM procedure   POLYPECTOMY  09/15/2020   Procedure: POLYPECTOMY;  Surgeon: Corbin Ade, MD;  [facility];  Service: Endoscopy;;   TUBAL LIGATION     Social History   Socioeconomic History   Marital status: Widowed    Spouse name: Not on file   Number of children: Not on file   Years of education: Not on file   Highest education level: Not on file  Occupational History   Not on file  Tobacco Use   Smoking status: Never   Smokeless tobacco: Never  Vaping Use   Vaping Use: Never used  Substance and Sexual Activity   Alcohol use: Yes    Comment: occasionally   Drug use: No   Sexual activity: Yes    Birth control/protection: Surgical  Other Topics Concern   Not on file  Social History Narrative   Not on file   Social Determinants of Health   Financial Resource Strain: Not on file  Food Insecurity: Not on file  Transportation Needs: Not on file  Physical Activity: Not on file  Stress: Not on file  Social Connections: Not on file   Family History  Problem Relation Age of Onset   Hypertension Mother    Diabetes Mother    Breast cancer Mother     Hypertension Father    Outpatient Encounter Medications as of 11/03/2022  Medication Sig   acetaminophen (TYLENOL) 500 MG tablet Take 500-1,000 mg by mouth every 6 (six) hours as needed for mild pain.   amLODipine (NORVASC) 10 MG tablet Take 10 mg by mouth daily.   aspirin EC 81 MG tablet Take 81 mg by mouth daily.   atorvastatin (LIPITOR) 40 MG tablet Take 40 mg by mouth daily. (Patient not taking: Reported on 09/08/2022)   diclofenac Sodium (VOLTAREN) 1 % GEL Apply 2 g topically 4 (four) times daily. (Patient taking differently: Apply 2 g topically 4 (four) times daily as needed (pain).)   fluticasone (FLONASE) 50 MCG/ACT nasal spray Place 2 sprays into both nostrils daily as needed for allergies or rhinitis.   JARDIANCE 25 MG TABS tablet Take 25 mg by mouth daily.   lisinopril (ZESTRIL) 20 MG tablet Take 20 mg by mouth every morning.   meclizine (ANTIVERT) 25 MG tablet  Take 25 mg by mouth 3 (three) times daily as needed for dizziness.   metFORMIN (GLUCOPHAGE-XR) 500 MG 24 hr tablet Take 500 mg by mouth daily.   metoprolol succinate (TOPROL XL) 25 MG 24 hr tablet Take 1 tablet (25 mg total) by mouth daily. (Patient not taking: Reported on 08/28/2022)   nystatin (MYCOSTATIN/NYSTOP) powder Apply 1 application  topically 2 (two) times daily as needed (yeast).   OVER THE COUNTER MEDICATION Take 1 tablet by mouth at bedtime as needed (leg cramps). legatrin pm   No facility-administered encounter medications on file as of 11/03/2022.   ALLERGIES: No Known Allergies  VACCINATION STATUS: Immunization History  Administered Date(s) Administered   H1N1 06/30/2008   Influenza Whole 03/12/2006, 06/03/2007   Moderna Sars-Covid-2 Vaccination 07/20/2019, 08/20/2019   Pneumococcal Polysaccharide-23 01/22/2007    HPI Samantha [name] is 77 y.o. female who presents today with a medical history as above. she is being seen in follow-up after she was seen in consultation for hyperthyroidism requested by Hillery Aldo, NP.  History is obtained directly from the patient and chart review.  Patient reports that she was informed of hyperthyroidism several weeks ago.  She reports 30 pounds of weight loss over 6 months, intermittent palpitations, tremors, and heat intolerance.  She is not on antithyroid intervention at this time.  She denies family history of thyroid problems.   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  She does not have time family history of thyroid malignancy.  She presents with 5 to 5 pounds of weight loss.  Her other medical problems include hypertension on treatment with amlodipine and lisinopril.  She is also on Jardiance and metformin for type 2 diabetes.  Her most recent A1c was 6.8%.  She has Lipitor 40 mg p.o. nightly for hyperlipidemia.  Review of Systems  Constitutional: +mildly fluctuating body weight with recent weight  loss, no fatigue, no subjective hyperthermia, no subjective hypothermia Eyes: no blurry vision, no xerophthalmia ENT: no sore throat, no nodules palpated in throat, no dysphagia/odynophagia, no hoarseness Cardiovascular: no Chest Pain, no Shortness of Breath, no palpitations, no leg swelling Respiratory: no cough, no shortness of breath Gastrointestinal: no Nausea/Vomiting/Diarhhea Musculoskeletal: no muscle/joint aches Skin: no rashes Neurological: no tremors, no numbness, no tingling, no dizziness Psychiatric: no depression, + anxiety  Objective:       11/03/2022    9:21 AM 11/03/2022    9:12 AM 10/05/2022    1:31 PM  Vitals with BMI  Height  5\' 1"  5\' 1"   Weight  172 lbs 6 oz 177 lbs  BMI  32.59 33.46  Systolic 142 146 161  Diastolic 78 76 64  Pulse  84 80    BP (!) 142/78 Comment: R arm with manuel cuff. Pt states she has not taken her medications today.  Pulse 84   Ht 5\' 1"  (1.549 m)   Wt 172 lb 6.4 oz (78.2 kg)   BMI  32.57 kg/m   Wt Readings from Last 3 Encounters:  11/03/22 172 lb 6.4 oz (78.2 kg)  10/05/22 177 lb (80.3 kg)  09/08/22 170 lb (77.1 kg)    Physical Exam  Constitutional:  Body mass index is 32.57 kg/m.,  not in acute distress, + anxious state of mind.   Eyes: PERRLA, EOMI, no exophthalmos ENT: moist mucous membranes, +gross thyromegaly, no gross cervical lymphadenopathy Cardiovascular: normal precordial activity, Regular Rate and  Rhythm, no Murmur/Rubs/Gallops   CMP ( most recent) CMP     Component Value Date/Time   NA 138 09/08/2022 2104   K 3.3 (L) 09/08/2022 2104   CL 108 09/08/2022 2104   CO2 24 09/08/2022 2104   GLUCOSE 112 (H) 09/08/2022 2104   BUN 11 09/08/2022 2104   CREATININE 0.88 09/08/2022 2104   CALCIUM 8.6 (L) 09/08/2022 2104   PROT 7.0 07/29/2022 1120   ALBUMIN 3.5 07/29/2022 1120   AST 15 07/29/2022 1120   ALT 16 07/29/2022 1120   ALKPHOS 103 07/29/2022 1120   BILITOT 0.7 07/29/2022 1120   GFRNONAA >60 09/08/2022 2104   GFRAA 55 (L) 05/08/2018 2247     Diabetic Labs (most recent): Lab Results  Component Value Date   HGBA1C 6.8 01/01/2009   HGBA1C 7.0 10/02/2008   HGBA1C 7.2 06/30/2008   MICROALBUR 1.54 02/09/2009   MICROALBUR 0.64 01/16/2008   MICROALBUR 1.69 12/03/2006     Lipid Panel ( most recent) Lipid Panel     Component Value Date/Time   CHOL 166 02/09/2009 2026   TRIG 86 02/09/2009 2026   HDL 60 02/09/2009 2026   CHOLHDL 2.8 Ratio 02/09/2009 2026   VLDL 17 02/09/2009 2026   LDLCALC 89 02/09/2009 2026      Lab Results  Component Value Date   TSH <0.01 (L) 10/06/2022   TSH 1.527 02/09/2009   TSH 1.318 01/16/2008   TSH 1.499 12/03/2006   FREET4 2.6 (H) 10/06/2022     Thyroid uptake and scan on Oct 20, 2022 FINDINGS: Unremarkable LEFT thyroid lobe.   Enlarged RIGHT thyroid lobe containing warm and cold nodules.   Patient has previously undergone biopsy of an indeterminate nodule in the RIGHT lobe.   4 hour I-123  uptake = 29.9% (normal 5-20%), 24 hour I-123 uptake = 59.4% (normal 10-30%)   IMPRESSION: Enlarged multinodular thyroid gland especially RIGHT lobe. Elevated 4 hour and 24 hour radio iodine uptakes consistent with hyperthyroidism.   Assessment & Plan:   1. Hyperthyroidism 2.  Toxic multinodular goiter  - I have reviewed her new thyroid function tests, thyroid uptake and scan, and available thyroid records and clinically evaluated the patient. - Based on these reviews, she has toxic multinodular goiter.   -Due to evident thyroid hormone burden, she was approached for treatment options.  Based option for her would be radioactive iodine thyroid ablation.  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  She agrees to proceed.  This treatment will be scheduled to be administered in [facility] in the next several days.  She will return in 9 weeks with repeat labs for reassessment.  In light of her metabolic dysfunction indicated by type 2 diabetes, hypertension, hyperlipidemia, she is advised to continue her current medications including metformin 500 mg XR p.o. daily, Jardiance 25 mg p.o. daily.  She is also advised to continue Lipitor 40 mg p.o. nightly for hyperlipidemia and amlodipine and lisinopril for hypertension.  She is advised to maintain close follow-up with her PCP.   - she is advised to maintain close follow up with Hillery Aldo, NP for primary care needs.    I spent  25  minutes in the care of the patient today including review of labs from Thyroid Function, CMP, and other relevant labs ; imaging/biopsy records (current and previous including abstractions from other facilities); face-to-face time discussing  her lab results and symptoms, medications doses, her options of short and long term treatment  based on the latest standards of care / guidelines;   and documenting the encounter.  Samantha [name]  participated in the discussions, expressed  understanding, and voiced agreement with the above plans.  All questions were answered to her satisfaction. she is encouraged to contact clinic should she have any questions or concerns prior to her return visit.   Follow up plan: Return in about 9 weeks (around 01/05/2023) for F/U with Labs after I131 Therapy.   Marquis Lunch, MD [facility] [address] [place], [place] [address] Phone: [phone]  Fax: [phone]     11/03/2022, 10:27 AM  This note was partially dictated with voice recognition software. Similar sounding words can be transcribed inadequately or may not  be corrected upon review.

## 2022-11-16 ENCOUNTER — Encounter: Payer: Self-pay | Admitting: Adult Health

## 2022-11-16 ENCOUNTER — Other Ambulatory Visit (HOSPITAL_COMMUNITY)
Admission: RE | Admit: 2022-11-16 | Discharge: 2022-11-16 | Disposition: A | Discharge status: Home / Self Care | Payer: Medicare HMO | Source: Ambulatory Visit | Attending: Adult Health | Admitting: Adult Health

## 2022-11-16 ENCOUNTER — Ambulatory Visit: Payer: Medicare HMO | Admitting: Adult Health

## 2022-11-16 VITALS — BP 156/87 | HR 81 | Ht 61.0 in | Wt 172.0 lb

## 2022-11-16 DIAGNOSIS — L292 Pruritus vulvae: Secondary | ICD-10-CM

## 2022-11-16 DIAGNOSIS — Z113 Encounter for screening for infections with a predominantly sexual mode of transmission: Secondary | ICD-10-CM

## 2022-11-16 DIAGNOSIS — Z0000 Encounter for general adult medical examination without abnormal findings: Secondary | ICD-10-CM | POA: Diagnosis not present

## 2022-11-16 DIAGNOSIS — N898 Other specified noninflammatory disorders of vagina: Secondary | ICD-10-CM | POA: Insufficient documentation

## 2022-11-16 DIAGNOSIS — I10 Essential (primary) hypertension: Secondary | ICD-10-CM | POA: Diagnosis not present

## 2022-11-16 MED ORDER — TRIAMCINOLONE ACETONIDE 0.5 % EX OINT: TOPICAL_OINTMENT | CUTANEOUS | 1 refills | Status: DC

## 2022-11-16 MED ORDER — FLUCONAZOLE 150 MG PO TABS: ORAL_TABLET | ORAL | 1 refills | Status: DC

## 2022-11-16 NOTE — Progress Notes (Signed)
Subjective:     Patient ID: Samantha [name], female   DOB: 03-06-1946, 77 y.o.   MRN: [id number]  HPI Samantha [name] is a 77 year old black female, widowed but engaged, PM in complaining of itching in vaginal area and dryness. She is a new pt. She wanted breast exam.  PCP is Samantha Aldo NP  Review of Systems +itching in vaginal area +vaginal dryness Denies any  vaginal bleeding Does have sex, more oral than penial vaginal, he had prostate cancer Reviewed past medical,surgical, social and family history. Reviewed medications and allergies.     Objective:   Physical Exam BP (!) 156/87 (BP Location: Right Arm, Patient Position: Sitting, Cuff Size: Normal)   Pulse 81   Ht 5\' 1"  (1.549 m)   Wt 172 lb (78 kg)   BMI 32.50 kg/m   Skin warm and dry.  Lungs: clear to ausculation bilaterally. Cardiovascular: regular rate and rhythm.     Breasts:no dominate palpable mass, retraction or nipple discharge  .Pelvic: external genitalia is normal in appearance no lesions,(but itching more base right labia), vagina: scant white discharge without odor,urethra has no lesions or masses noted, cervix:smooth, uterus: normal size, shape and contour, non tender, no masses felt, adnexa: no masses or tenderness noted. Bladder is non tender and no masses felt.  CV swab obtained.  AA is 1 Fall risk is low    11/16/2022   10:26 AM  Depression screen PHQ 2/9  Decreased Interest 0  Down, Depressed, Hopeless 0  PHQ - 2 Score 0  Altered sleeping 0  Tired, decreased energy 3  Change in appetite 2  Feeling bad or failure about yourself  0  Trouble concentrating 1  Moving slowly or fidgety/restless 0  Suicidal thoughts 0  PHQ-9 Score 6       11/16/2022   10:26 AM  GAD 7 : Generalized Anxiety Score  Nervous, Anxious, on Edge 1  Control/stop worrying 0  Worry too much - different things 1  Trouble relaxing 1  Restless 1  Easily annoyed or irritable 1  Afraid - awful might happen 1  Total GAD 7 Score 6       Upstream - 11/16/22 1038       Pregnancy Intention Screening   Does the patient want to become pregnant in the next year? N/A    Does the patient's partner want to become pregnant in the next year? N/A    Would the patient like to discuss contraceptive options today? N/A      Contraception Wrap Up   Current Method Female Sterilization    End Method Female Sterilization    Contraception Counseling Provided No            Examination chaperoned by Samantha Mood LPN  Assessment:     1. Vulvar itching +itching base right labia  Will rx diflucan and triamcinolone 0.5 % cream   Meds ordered this encounter  Medications   triamcinolone ointment (KENALOG) 0.5 %    Sig: Apply thin layer 2 x daily as needed to vulva for itching    Dispense:  30 g    Refill:  1    Order Specific Question:   Supervising Provider    Answer:   Samantha [name] [[id number]]   fluconazole (DIFLUCAN) 150 MG tablet    Sig: Take 1 now and 1 in 3 days, do not take lipitor when taking    Dispense:  2 tablet    Refill:  1  Order Specific Question:   Supervising Provider    Answer:   Samantha [name] [[id number]]     2. Vaginal discharge CV swab sent  - Cervicovaginal ancillary only( [place])  3. Normal breast exam  4. Vaginal dryness Try luvena or replens and use astroglide if has sex  5. Screening examination for STD (sexually transmitted disease) CV swab sent for GC/CHL,trich and BV and yeast  - Cervicovaginal ancillary only( [place])  6. Essential hypertension, benign Just took BP meds in office Follow up with PCP    Plan:     Follow up prn

## 2022-11-17 LAB — CERVICOVAGINAL ANCILLARY ONLY
Bacterial Vaginitis (gardnerella): POSITIVE — AB
Candida Glabrata: NEGATIVE
Candida Vaginitis: POSITIVE — AB
Chlamydia: NEGATIVE
Comment: NEGATIVE
Comment: NEGATIVE
Comment: NEGATIVE
Comment: NEGATIVE
Comment: NEGATIVE
Comment: NORMAL
Neisseria Gonorrhea: NEGATIVE
Trichomonas: NEGATIVE

## 2022-11-17 NOTE — Written Directive (Cosign Needed)
MOLECULAR IMAGING AND THERAPEUTICS WRITTEN DIRECTIVE   PATIENT NAME: Samantha [name]  PT DOB:   1946/04/16                                              MRN: [id number]  ---------------------------------------------------------------------------------------------------------------------   I-131 WHOLE THYROID THERAPY (NON-CANCER)    RADIOPHARMACEUTICAL:   Iodine-131 Capsule    PRESCRIBED DOSE FOR ADMINISTRATION:    ROUTE OFADMINISTRATION: PO   DIAGNOSIS:  Hyperthyroidism   REFERRING PHYSICIAN:  Dr. Fransico Him   TSH:    Lab Results  Component Value Date   TSH <0.01 (L) 10/06/2022   TSH 1.527 02/09/2009   TSH 1.318 01/16/2008     PRIOR I-131 THERAPY (Date and Dose):   PRIOR RADIOLOGY EXAMS (Results and Date): NM THYROID MULT UPTAKE W/IMAGING  Result Date: 10/20/2022 CLINICAL DATA:  Hyperthyroidism, suppressed TSH, neck swelling, difficulty swallowing, weight loss, heat intolerance, diarrhea, difficulty sleeping, hand tremors, irritability, increased thirst and perspiration, heart palpitations, tiredness EXAM: THYROID SCAN AND UPTAKE - 4 AND 24 HOURS TECHNIQUE: Following oral administration of I-123 capsule, anterior planar imaging was acquired at 24 hours. Thyroid uptake was calculated with a thyroid probe at 4-6 hours and 24 hours. RADIOPHARMACEUTICALS:  442 uCi I-123 sodium iodide p.o. COMPARISON:  Thyroid ultrasound 07/06/2021 FINDINGS: Unremarkable LEFT thyroid lobe. Enlarged RIGHT thyroid lobe containing warm and cold nodules. Patient has previously undergone biopsy of an indeterminate nodule in the RIGHT lobe. 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 Elevated 4 hour and 24 hour radio iodine uptakes consistent with hyperthyroidism. Electronically Signed   By: Ulyses Southward M.D.   On: 10/20/2022 11:27   US THYROID  Result Date: 07/07/2021 CLINICAL DATA:   Thyromegaly, neck mass EXAM: THYROID ULTRASOUND TECHNIQUE: Ultrasound examination of the thyroid gland and adjacent soft tissues was performed. COMPARISON:  None. FINDINGS: Parenchymal Echotexture: Markedly heterogenous Isthmus: 0.5 cm Right lobe: 8.3 x 3.4 x 4.1 cm Left lobe: 4.7 x 2.1 x 1.7 cm _________________________________________________________ Estimated total number of nodules >/= 1 cm: 4 Number of spongiform nodules >/=  2 cm not described below (TR1): 0 Number of mixed cystic and solid nodules >/= 1.5 cm not described below (TR2): 0 _________________________________________________________ Nodule # 1: Location: Isthmus; Maximum size: 2.3 cm; Other 2 dimensions: 1.9 x 1.2 cm Composition: solid/almost completely solid (2) Echogenicity: isoechoic (1) Shape: not taller-than-wide (0) Margins: ill-defined (0) Echogenic foci: none (0) ACR TI-RADS total points: 3. ACR TI-RADS risk category: TR 3. ACR TI-RADS recommendations: *Given size (>/= 1.5 - 2.4 cm) and appearance, a follow-up ultrasound in 1 year should be considered based on TI-RADS criteria. _________________________________________________________ Nodule # 2: Location: Right; mid Maximum size: 8.3 cm; Other 2 dimensions: 3.4 x 4.1 cm Composition: solid/almost completely solid (2) Echogenicity: isoechoic (1) Shape: not taller-than-wide (0) Margins: ill-defined (0) Echogenic foci: none (0) ACR TI-RADS total points: 3. ACR TI-RADS risk category: TR3 (3 points). ACR TI-RADS recommendations: **Given size (>/= 2.5 cm) and appearance, fine needle aspiration of this mildly suspicious nodule should be considered based on TI-RADS criteria. _________________________________________________________ Nodule # 3: Location: Left; mid Maximum size: 2.3 cm; Other 2 dimensions:   1.4 x 1.4 cm Composition: spongiform (0) Echogenicity: hypoechoic (2) Shape: not taller-than-wide (0) Margins: ill-defined (0) Echogenic foci: none (0) ACR TI-RADS  total points: 2. ACR TI-RADS  risk category: TR 2. ACR TI-RADS recommendations: This nodule does NOT meet TI-RADS criteria for biopsy or dedicated follow-up. _________________________________________________________ Nodule # 4: Location: Left; inferior Maximum size: 1.1 cm; Other 2 dimensions: 1 x 1 cm Composition: solid/almost completely solid (2) Echogenicity: isoechoic (1) Shape: not taller-than-wide (0) Margins: ill-defined (0) Echogenic foci: none (0) ACR TI-RADS total points: 3. ACR TI-RADS risk category: TR 3. ACR TI-RADS recommendations: Given size (<1.4 cm) and appearance, this nodule does NOT meet TI-RADS criteria for biopsy or dedicated follow-up. _________________________________________________________ No regional cervical adenopathy identified. IMPRESSION: 1. Thyromegaly with bilateral nodules. 2. The right lobe is entirely replaced by a large nodule which meets criteria for biopsy. 3. Recommend annual/biennial ultrasound follow-up of additional nodules as above, until stability x5 years confirmed. The above is in keeping with the ACR TI-RADS recommendations - Kenzie Flakes Am Coll Radiol 2017;14:587-595. Electronically Signed   By: Corlis Leak M.D.   On: 07/07/2021 08:31      ADDITIONAL PHYSICIAN COMMENTS/NOTES   AUTHORIZED USER SIGNATURE & TIME STAMP:

## 2022-11-20 ENCOUNTER — Telehealth: Payer: Self-pay | Admitting: *Deleted

## 2022-11-20 ENCOUNTER — Other Ambulatory Visit: Payer: Self-pay | Admitting: Adult Health

## 2022-11-20 MED ORDER — METRONIDAZOLE 500 MG PO TABS: 500.0000 mg | ORAL_TABLET | Freq: Two times a day (BID) | ORAL | 0 refills | Status: DC

## 2022-11-20 NOTE — Telephone Encounter (Signed)
-----   Message from Adline Potter, NP sent at 11/20/2022  1:48 PM EDT ----- Let her know about this [facility]

## 2022-11-20 NOTE — Telephone Encounter (Signed)
Left message @ 3:54 pm, asking pt to return the call or take a look at her MyChart message. JSY

## 2022-11-21 NOTE — Telephone Encounter (Signed)
Left message @ 4:55 pm regarding results. JSY

## 2022-11-22 NOTE — Telephone Encounter (Signed)
Pt aware swab was + for yeast & BV. Negative for STD's. Diflucan should take care of the yeast. Flagyl was sent in for BV. No alcohol or sex while taking med. Pt voiced understanding. JSY

## 2022-11-22 NOTE — Telephone Encounter (Signed)
Left message @ 2:34 pm regarding results. JSY

## 2022-11-28 ENCOUNTER — Encounter (HOSPITAL_COMMUNITY)
Admission: RE | Admit: 2022-11-28 | Discharge: 2022-11-28 | Disposition: A | Discharge status: Home / Self Care | Payer: Medicare HMO | Source: Ambulatory Visit | Attending: "Endocrinology | Admitting: "Endocrinology

## 2022-11-28 DIAGNOSIS — E0590 Thyrotoxicosis, unspecified without thyrotoxic crisis or storm: Secondary | ICD-10-CM | POA: Insufficient documentation

## 2022-11-28 MED ORDER — SODIUM IODIDE I 131 CAPSULE
20.1000 | Freq: Once | INTRAVENOUS | Status: AC | PRN
  Administered 2022-11-28: 20.1 via ORAL

## 2022-12-04 ENCOUNTER — Telehealth: Payer: Self-pay

## 2022-12-04 NOTE — Telephone Encounter (Signed)
Pt called stating she had her radiation treatment for her thyroid last Tuesday. States she has been experiencing itching and her "neuralgia" has returned in her leg and buttocks.

## 2022-12-04 NOTE — Telephone Encounter (Signed)
Spoke with pt advising her that her symptoms she's experiencing are not typical of the radioactive iodine treatment and to contact her PCP if her symptoms persist or worsen per Dr.Nida's orders. Pt voiced understanding.

## 2022-12-05 ENCOUNTER — Other Ambulatory Visit (HOSPITAL_COMMUNITY): Payer: Self-pay | Admitting: Family

## 2022-12-05 ENCOUNTER — Encounter (HOSPITAL_COMMUNITY): Payer: Self-pay | Admitting: Family

## 2022-12-05 DIAGNOSIS — M25551 Pain in right hip: Secondary | ICD-10-CM

## 2022-12-05 DIAGNOSIS — M545 Low back pain, unspecified: Secondary | ICD-10-CM

## 2022-12-07 ENCOUNTER — Ambulatory Visit (HOSPITAL_COMMUNITY)
Admission: RE | Admit: 2022-12-07 | Discharge: 2022-12-07 | Disposition: A | Discharge status: Home / Self Care | Payer: Medicare HMO | Source: Ambulatory Visit | Attending: Family | Admitting: Family

## 2022-12-07 DIAGNOSIS — M25551 Pain in right hip: Secondary | ICD-10-CM | POA: Diagnosis present

## 2022-12-07 DIAGNOSIS — M545 Low back pain, unspecified: Secondary | ICD-10-CM | POA: Insufficient documentation

## 2023-01-02 ENCOUNTER — Other Ambulatory Visit (HOSPITAL_COMMUNITY): Payer: Self-pay | Admitting: Neurosurgery

## 2023-01-02 DIAGNOSIS — M4316 Spondylolisthesis, lumbar region: Secondary | ICD-10-CM

## 2023-01-03 ENCOUNTER — Encounter (HOSPITAL_BASED_OUTPATIENT_CLINIC_OR_DEPARTMENT_OTHER): Payer: Self-pay

## 2023-01-03 ENCOUNTER — Other Ambulatory Visit: Payer: Self-pay

## 2023-01-03 ENCOUNTER — Emergency Department (HOSPITAL_BASED_OUTPATIENT_CLINIC_OR_DEPARTMENT_OTHER)
Admission: EM | Admit: 2023-01-03 | Discharge: 2023-01-03 | Disposition: A | Discharge status: Home / Self Care | Payer: Medicare HMO | Attending: Emergency Medicine | Admitting: Emergency Medicine

## 2023-01-03 DIAGNOSIS — M545 Low back pain, unspecified: Secondary | ICD-10-CM | POA: Diagnosis not present

## 2023-01-03 DIAGNOSIS — Z7982 Long term (current) use of aspirin: Secondary | ICD-10-CM | POA: Diagnosis not present

## 2023-01-03 DIAGNOSIS — Z79899 Other long term (current) drug therapy: Secondary | ICD-10-CM | POA: Diagnosis not present

## 2023-01-03 DIAGNOSIS — M542 Cervicalgia: Secondary | ICD-10-CM | POA: Insufficient documentation

## 2023-01-03 DIAGNOSIS — G8929 Other chronic pain: Secondary | ICD-10-CM | POA: Diagnosis not present

## 2023-01-03 DIAGNOSIS — Z7984 Long term (current) use of oral hypoglycemic drugs: Secondary | ICD-10-CM | POA: Insufficient documentation

## 2023-01-03 MED ORDER — OXYCODONE-ACETAMINOPHEN 5-325 MG PO TABS
1.0000 | ORAL_TABLET | Freq: Once | ORAL | Status: AC
  Administered 2023-01-03: 1 via ORAL
  Filled 2023-01-03: qty 1

## 2023-01-03 MED ORDER — OXYCODONE-ACETAMINOPHEN 5-325 MG PO TABS: 1.0000 | ORAL_TABLET | Freq: Four times a day (QID) | ORAL | 0 refills | Status: DC | PRN

## 2023-01-03 NOTE — ED Provider Notes (Signed)
[place] EMERGENCY DEPARTMENT AT [facility] Provider Note   CSN: [id number] Arrival date & time: 01/03/23  1813     History Chief Complaint  Patient presents with   Back Pain   Neck Pain    Samantha [name] is a 77 y.o. female.  Patient presents to the emergency department with concerns of back pain and neck pain.  Reports this is chronic issue in nature.  Currently has MRI imaging scheduled for August 1.  Requesting imaging here with MRI.  As previously had x-ray imaging of multiple areas of her body due to pain.  Patient reports frustration as she has been seen by multiple providers for similar symptoms and feels that she is not having improvement in her symptoms.   Back Pain Neck Pain      Home Medications Prior to Admission medications   Medication Sig Start Date End Date Taking? Authorizing Provider  metroNIDAZOLE (FLAGYL) 500 MG tablet Take 1 tablet (500 mg total) by mouth 2 (two) times daily. 11/20/22   Adline Potter, NP  oxyCODONE-acetaminophen (PERCOCET/ROXICET) 5-325 MG tablet Take 1 tablet by mouth every 6 (six) hours as needed for severe pain. 01/03/23  Yes Smitty Knudsen, PA-C  acetaminophen (TYLENOL) 500 MG tablet Take 500-1,000 mg by mouth every 6 (six) hours as needed for mild pain.    [provider]  amLODipine (NORVASC) 10 MG tablet Take 10 mg by mouth daily. 09/08/22   [provider]  aspirin EC 81 MG tablet Take 81 mg by mouth daily.    [provider]  atorvastatin (LIPITOR) 40 MG tablet Take 40 mg by mouth daily.    [provider]  fluconazole (DIFLUCAN) 150 MG tablet Take 1 now and 1 in 3 days, do not take lipitor when taking 11/16/22   Adline Potter, NP  fluticasone (FLONASE) 50 MCG/ACT nasal spray Place 2 sprays into both nostrils daily as needed for allergies or rhinitis.    [provider]  JARDIANCE 25 MG TABS tablet Take 25 mg by mouth daily. 07/20/22   [provider]  lisinopril  (ZESTRIL) 20 MG tablet Take 20 mg by mouth every morning. 07/20/22   [provider]  meclizine (ANTIVERT) 25 MG tablet Take 25 mg by mouth 3 (three) times daily as needed for dizziness.    [provider]  metFORMIN (GLUCOPHAGE-XR) 500 MG 24 hr tablet Take 500 mg by mouth daily. 07/18/22   [provider]  metoprolol succinate (TOPROL XL) 25 MG 24 hr tablet Take 1 tablet (25 mg total) by mouth daily. 08/04/22   Leone Brand, NP  OVER THE COUNTER MEDICATION Take 1 tablet by mouth at bedtime as needed (leg cramps). legatrin pm    [provider]  OZEMPIC, 0.25 OR 0.5 MG/DOSE, 2 MG/3ML SOPN Inject into the skin. Patient not taking: Reported on 11/16/2022 11/09/22   [provider]  triamcinolone ointment (KENALOG) 0.5 % Apply thin layer 2 x daily as needed to vulva for itching 11/16/22   Adline Potter, NP      Allergies    Patient has no known allergies.    Review of Systems   Review of Systems  Musculoskeletal:  Positive for back pain and neck pain.  All other systems reviewed and are negative.   Physical Exam Updated Vital Signs BP (!) 118/106   Pulse (!) 118   Temp 99 F (37.2 C)   Resp 18   SpO2 100%  Physical  Exam Vitals and nursing note reviewed.  Constitutional:      General: She is not in acute distress.    Appearance: She is well-developed.  HENT:     Head: Normocephalic and atraumatic.  Eyes:     Conjunctiva/sclera: Conjunctivae normal.  Cardiovascular:     Rate and Rhythm: Normal rate and regular rhythm.     Heart sounds: No murmur heard. Pulmonary:     Effort: Pulmonary effort is normal. No respiratory distress.     Breath sounds: Normal breath sounds.  Abdominal:     Palpations: Abdomen is soft.     Tenderness: There is no abdominal tenderness.  Musculoskeletal:        General: Tenderness present. No swelling, deformity or signs of injury.       Arms:     Cervical back: Neck supple.  Skin:    General: Skin is  warm and dry.     Capillary Refill: Capillary refill takes less than 2 seconds.  Neurological:     Mental Status: She is alert.  Psychiatric:        Mood and Affect: Mood normal.     ED Results / Procedures / Treatments   Labs (all labs ordered are listed, but only abnormal results are displayed) Labs Reviewed - No data to display  EKG None  Radiology No results found.  Procedures Procedures   Medications Ordered in ED Medications  oxyCODONE-acetaminophen (PERCOCET/ROXICET) 5-325 MG per tablet 1 tablet (1 tablet Oral Given 01/03/23 2240)    ED Course/ Medical Decision Making/ A&P                           Medical Decision Making Risk Prescription drug management.   This patient presents to the ED for concern of back pain and neck pain.  Differential diagnosis includes chronic pain, fibromyalgia, lumbar radiculopathy, cervical radiculopathy   Medicines ordered and prescription drug management:  I ordered medication including Percocet for pain Reevaluation of the patient after these medicines showed that the patient improved I have reviewed the patients home medicines and have made adjustments as needed   Problem List / ED Course:  Patient presented to the emergency department concerns of chronic low back pain as well as neck pain.  Reports that she currently has MRI imaging scheduled for August 1 for further evaluation of symptoms.  Has been seen by multiple specialist for her current symptoms and feels that she does not have any improvement.  Try to manage symptoms at home with Tylenol. Given prior x-ray imaging performed and lack of MRI imaging here available today, I do not believe the patient would benefit from any further imaging at this time.  Patient has not had any acute change or decline in her symptoms and denies any recent falls, injuries of any kind or any trauma to the affected joints or extremities to account for worsening pain.  Advised patient that she  should follow-up with her primary care provider for further evaluation maintain scheduled MRI as needed.  Will send patient a short prescription for Percocet for use at home.  Patient agreeable to treatment plan verbalized understanding all return precautions.  Final Clinical Impression(s) / ED Diagnoses Final diagnoses:  Chronic bilateral low back pain without sciatica    Rx / DC Orders ED Discharge Orders          Ordered    oxyCODONE-acetaminophen (PERCOCET/ROXICET) 5-325 MG tablet  Every 6 hours PRN  01/03/23 2233              Smitty Knudsen, PA-C 01/03/23 2355    Sloan Leiter, DO 01/05/23 1514

## 2023-01-03 NOTE — ED Triage Notes (Signed)
Pt c/o bilateral hip (starting on R), back, neck pain x80mo. Pt states she is still ambulatory at home "but only if I have to." Family reports pt has "been in bed for the last 3 days." States she's been seen by "at least 3-5 doctors," no relief yet.   MRI scheduled 8/1

## 2023-01-03 NOTE — ED Notes (Signed)
 RN reviewed discharge instructions with pt. Pt verbalized understanding and had no further questions. VSS upon discharge.  

## 2023-01-03 NOTE — Discharge Instructions (Addendum)
You are seen in the emergency department for back and neck pain.  Unfortunately MRI is not available tonight and I would advise that you either keep your outpatient MRI as scheduled or if you are concerned about any worsening symptoms, you may seek care at another ER for potential imaging.  I have sent a prescription for Percocet for pain control.  Please pick this up tomorrow and take as needed for more severe pain.

## 2023-01-04 ENCOUNTER — Emergency Department (HOSPITAL_COMMUNITY): Payer: Medicare HMO

## 2023-01-04 ENCOUNTER — Other Ambulatory Visit: Payer: Self-pay

## 2023-01-04 ENCOUNTER — Emergency Department (HOSPITAL_COMMUNITY)
Admission: EM | Admit: 2023-01-04 | Discharge: 2023-01-04 | Disposition: A | Discharge status: Home / Self Care | Payer: Medicare HMO | Source: Home / Self Care | Attending: Emergency Medicine | Admitting: Emergency Medicine

## 2023-01-04 DIAGNOSIS — N189 Chronic kidney disease, unspecified: Secondary | ICD-10-CM | POA: Diagnosis not present

## 2023-01-04 DIAGNOSIS — Z7984 Long term (current) use of oral hypoglycemic drugs: Secondary | ICD-10-CM | POA: Diagnosis not present

## 2023-01-04 DIAGNOSIS — I129 Hypertensive chronic kidney disease with stage 1 through stage 4 chronic kidney disease, or unspecified chronic kidney disease: Secondary | ICD-10-CM | POA: Insufficient documentation

## 2023-01-04 DIAGNOSIS — Z7982 Long term (current) use of aspirin: Secondary | ICD-10-CM | POA: Diagnosis not present

## 2023-01-04 DIAGNOSIS — E1122 Type 2 diabetes mellitus with diabetic chronic kidney disease: Secondary | ICD-10-CM | POA: Insufficient documentation

## 2023-01-04 DIAGNOSIS — M545 Low back pain, unspecified: Secondary | ICD-10-CM | POA: Insufficient documentation

## 2023-01-04 DIAGNOSIS — R000 Tachycardia, unspecified: Secondary | ICD-10-CM | POA: Insufficient documentation

## 2023-01-04 DIAGNOSIS — E0590 Thyrotoxicosis, unspecified without thyrotoxic crisis or storm: Secondary | ICD-10-CM | POA: Insufficient documentation

## 2023-01-04 DIAGNOSIS — Z79899 Other long term (current) drug therapy: Secondary | ICD-10-CM | POA: Diagnosis not present

## 2023-01-04 DIAGNOSIS — E039 Hypothyroidism, unspecified: Secondary | ICD-10-CM | POA: Diagnosis not present

## 2023-01-04 DIAGNOSIS — M542 Cervicalgia: Secondary | ICD-10-CM | POA: Diagnosis present

## 2023-01-04 DIAGNOSIS — E1165 Type 2 diabetes mellitus with hyperglycemia: Secondary | ICD-10-CM | POA: Insufficient documentation

## 2023-01-04 LAB — BASIC METABOLIC PANEL
Anion gap: 10 (ref 5–15)
BUN: 20 mg/dL (ref 8–23)
CO2: 25 mmol/L (ref 22–32)
Calcium: 10 mg/dL (ref 8.9–10.3)
Chloride: 98 mmol/L (ref 98–111)
Creatinine, Ser: 0.96 mg/dL (ref 0.44–1.00)
GFR, Estimated: >60 mL/min (ref >60)
Glucose, Bld: 269 mg/dL — ABNORMAL HIGH (ref 70–99)
Potassium: 3.7 mmol/L (ref 3.5–5.1)
Sodium: 133 mmol/L — ABNORMAL LOW (ref 135–145)

## 2023-01-04 LAB — T4, FREE: Free T4: >5.5 ng/dL — ABNORMAL HIGH (ref 0.61–1.12)

## 2023-01-04 LAB — URINALYSIS, ROUTINE W REFLEX MICROSCOPIC
Bilirubin Urine: NEGATIVE
Glucose, UA: >=500 mg/dL — AB
Hgb urine dipstick: NEGATIVE
Ketones, ur: NEGATIVE mg/dL
Leukocytes,Ua: NEGATIVE
Nitrite: POSITIVE — AB
Protein, ur: NEGATIVE mg/dL
Specific Gravity, Urine: 1.022 (ref 1.005–1.030)
pH: 5 (ref 5.0–8.0)

## 2023-01-04 LAB — CBC WITH DIFFERENTIAL/PLATELET
Abs Immature Granulocytes: 0.03 10*3/uL (ref 0.00–0.07)
Basophils Absolute: 0 10*3/uL (ref 0.0–0.1)
Basophils Relative: 0 %
Eosinophils Absolute: 0 10*3/uL (ref 0.0–0.5)
Eosinophils Relative: 0 %
HCT: 38.5 % (ref 36.0–46.0)
Hemoglobin: 11.4 g/dL — ABNORMAL LOW (ref 12.0–15.0)
Immature Granulocytes: 0 %
Lymphocytes Relative: 10 %
Lymphs Abs: 0.9 10*3/uL (ref 0.7–4.0)
MCH: 25.2 pg — ABNORMAL LOW (ref 26.0–34.0)
MCHC: 29.6 g/dL — ABNORMAL LOW (ref 30.0–36.0)
MCV: 85.2 fL (ref 80.0–100.0)
Monocytes Absolute: 0.7 10*3/uL (ref 0.1–1.0)
Monocytes Relative: 7 %
Neutro Abs: 7.8 10*3/uL — ABNORMAL HIGH (ref 1.7–7.7)
Neutrophils Relative %: 83 %
Platelets: 264 10*3/uL (ref 150–400)
RBC: 4.52 MIL/uL (ref 3.87–5.11)
RDW: 15.6 % — ABNORMAL HIGH (ref 11.5–15.5)
WBC: 9.4 10*3/uL (ref 4.0–10.5)
nRBC: 0 % (ref 0.0–0.2)

## 2023-01-04 LAB — TSH: TSH: <0.01 u[IU]/mL — ABNORMAL LOW (ref 0.350–4.500)

## 2023-01-04 LAB — MAGNESIUM: Magnesium: 1.9 mg/dL (ref 1.7–2.4)

## 2023-01-04 MED ORDER — METOPROLOL TARTRATE 25 MG PO TABS: 25.0000 mg | ORAL_TABLET | Freq: Two times a day (BID) | ORAL | 0 refills | Status: DC

## 2023-01-04 MED ORDER — METHOCARBAMOL 500 MG PO TABS: 500.0000 mg | ORAL_TABLET | Freq: Three times a day (TID) | ORAL | 0 refills | Status: DC

## 2023-01-04 MED ORDER — CYCLOBENZAPRINE HCL 10 MG PO TABS
10.0000 mg | ORAL_TABLET | Freq: Once | ORAL | Status: AC
  Administered 2023-01-04: 10 mg via ORAL
  Filled 2023-01-04: qty 1

## 2023-01-04 MED ORDER — OXYCODONE-ACETAMINOPHEN 5-325 MG PO TABS
1.0000 | ORAL_TABLET | Freq: Once | ORAL | Status: AC
  Administered 2023-01-04: 1 via ORAL
  Filled 2023-01-04: qty 1

## 2023-01-04 NOTE — ED Provider Notes (Signed)
[place] EMERGENCY DEPARTMENT AT [facility] Provider Note   CSN: [id number] Arrival date & time: 01/04/23  1241     History  Chief Complaint  Patient presents with   Torticollis    Samantha [name] is a 77 y.o. female.  HPI     Samantha [name] is a 77 y.o. female with past medical history of hypertension, hypothyroidism, type 2 diabetes, CKD who presents to the Emergency Department complaining of bilateral neck pain and low back pain.  Describes pain of her lower back x 2 months.  She is being seen by neurosurgery for this and has MRI of her lumbar spine scheduled for August 1.  States she has had neck pain in the past as well but pain has been worsening.  She is having difficulty moving her neck from left to right.  Pain slightly worse on the right side of her neck.  She denies any headache, dizziness, visual changes, fever, chills, pain numbness or weakness of her upper or lower extremities.  No urine or bowel changes.  No known injury.  She does endorse history of hyperthyroidism and tachycardia, takes 25 mg metoprolol once daily.  Sees local endocrinology.  Home Medications Prior to Admission medications   Medication Sig Start Date End Date Taking? Authorizing Provider  acetaminophen (TYLENOL) 500 MG tablet Take 500-1,000 mg by mouth every 6 (six) hours as needed for mild pain.   Yes [provider]  acetaminophen-codeine (TYLENOL #2) 300-15 MG tablet Take 1 tablet by mouth every 12 (twelve) hours as needed for moderate pain. 01/01/23  Yes [provider]  amLODipine (NORVASC) 10 MG tablet Take 10 mg by mouth daily. 09/08/22  Yes [provider]  cyclobenzaprine (FLEXERIL) 5 MG tablet  12/04/22  Yes [provider]  gabapentin (NEURONTIN) 300 MG capsule Take 300 mg by mouth 3 (three) times daily. 12/16/22 12/16/23 Yes [provider]  JARDIANCE 25 MG TABS tablet Take 25 mg by mouth daily. 07/20/22  Yes [provider]  lisinopril  (ZESTRIL) 20 MG tablet Take 20 mg by mouth every morning. 07/20/22  Yes [provider]  meclizine (ANTIVERT) 25 MG tablet Take 25 mg by mouth 3 (three) times daily as needed for dizziness.   Yes [provider]  metFORMIN (GLUCOPHAGE-XR) 500 MG 24 hr tablet Take 500 mg by mouth daily. 07/18/22  Yes [provider]  metoprolol succinate (TOPROL XL) 25 MG 24 hr tablet Take 1 tablet (25 mg total) by mouth daily. 08/04/22  Yes Leone Brand, NP  OVER THE COUNTER MEDICATION Take 1 tablet by mouth at bedtime as needed (leg cramps). legatrin pm   Yes [provider]  oxyCODONE-acetaminophen (PERCOCET/ROXICET) 5-325 MG tablet Take 1 tablet by mouth every 6 (six) hours as needed for severe pain. 01/03/23  Yes Smitty Knudsen, PA-C  aspirin EC 81 MG tablet Take 81 mg by mouth daily.    [provider]  MEDROL 4 MG TBPK tablet Take by mouth. Patient not taking: Reported on 01/04/2023 12/18/22   [provider]  OZEMPIC, 0.25 OR 0.5 MG/DOSE, 2 MG/3ML SOPN Inject into the skin. Patient not taking: Reported on 11/16/2022 11/09/22   [provider]  triamcinolone ointment (KENALOG) 0.5 % Apply thin layer 2 x daily as needed to vulva for itching Patient not taking: Reported on 01/04/2023 11/16/22   Samantha Potter, NP      Allergies    Patient has no known allergies.  Review of Systems   Review of Systems  Physical Exam Updated Vital Signs BP 120/70 (BP Location: Right Arm)   Pulse (!) 120   Temp 98.7 F (37.1 C) (Oral)   Resp 18   Ht 5\' 1"  (1.549 m)   Wt 73.5 kg   SpO2 97%   BMI 30.61 kg/m  Physical Exam  ED Results / Procedures / Treatments   Labs (all labs ordered are listed, but only abnormal results are displayed) Labs Reviewed  CBC WITH DIFFERENTIAL/PLATELET - Abnormal; Notable for the following components:      Result Value   Hemoglobin 11.4 (*)    MCH 25.2 (*)    MCHC 29.6 (*)    RDW 15.6 (*)    Neutro Abs 7.8 (*)    All  other components within normal limits  BASIC METABOLIC PANEL - Abnormal; Notable for the following components:   Sodium 133 (*)    Glucose, Bld 269 (*)    All other components within normal limits  URINALYSIS, ROUTINE W REFLEX MICROSCOPIC - Abnormal; Notable for the following components:   Glucose, UA >=500 (*)    Nitrite POSITIVE (*)    Bacteria, UA RARE (*)    All other components within normal limits  TSH - Abnormal; Notable for the following components:   TSH <0.010 (*)    All other components within normal limits  URINE CULTURE  MAGNESIUM  T3, FREE  T4, FREE    EKG None  Radiology CT Cervical Spine Wo Contrast  Result Date: 01/04/2023 CLINICAL DATA:  Neck pain for 2 months, initial encounter EXAM: CT CERVICAL SPINE WITHOUT CONTRAST TECHNIQUE: Multidetector CT imaging of the cervical spine was performed without intravenous contrast. Multiplanar CT image reconstructions were also generated. RADIATION DOSE REDUCTION: This exam was performed according to the departmental dose-optimization program which includes automated exposure control, adjustment of the mA and/or kV according to patient size and/or use of iterative reconstruction technique. COMPARISON:  None Available. FINDINGS: Alignment: Mild straightening of the normal cervical lordosis is noted. Skull base and vertebrae: 7 cervical segments are well visualized. Vertebral body height is well maintained. Multilevel osteophytic changes and facet hypertrophic changes are noted. No acute fracture or acute facet abnormality is noted. The odontoid is within normal limits. Soft tissues and spinal canal: Surrounding soft tissue structures are within normal limits. 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 Upper chest: Visualized lung apices are within normal limits. Other: None IMPRESSION: Multilevel degenerative changes are noted. No acute abnormality is seen. Electronically Signed   By: Alcide Clever M.D.   On:  01/04/2023 17:23    Procedures Procedures    Medications Ordered in ED Medications  cyclobenzaprine (FLEXERIL) tablet 10 mg (10 mg Oral Given 01/04/23 1509)  oxyCODONE-acetaminophen (PERCOCET/ROXICET) 5-325 MG per tablet 1 tablet (1 tablet Oral Given 01/04/23 1809)    ED Course/ Medical Decision Making/ A&P                             Medical Decision Making Patient here with neck and low back pain.  Endorses history of similar symptoms in the past.  Has been seen by neurosurgery and MRI of her lumbar spine ordered for August 1.  Worsening neck pain for several days.  Pain associated with movement.  No focal neurodeficits on exam.  Patient tachycardic here, history of hyperthyroidism, on metoprolol 25 mg once daily.  Remains tachycardic.  On review of  medical records, last TSH from April less than 0.01, free T4 and free T3 both elevated  Differential would include but not limited to acute on chronic neck and low back pain, meningitis, degenerative changes, meningitis, thyroid storm Thyroid storm felt less likely given lack of agitation and confusion, jaundice, LOC, significant hypertension or significant fever.  Amount and/or Complexity of Data Reviewed Labs: ordered.    Details: Labs today without evidence of leukocytosis, magnesium unremarkable, chemistries blood sugar of 269 otherwise unremarkable.  Urinalysis shows infection with positive nitrites rare bacteria 0-5 white cells.  Urine culture pending.  TSH today again less than 0.01.  Free T4 and free T3 pending Radiology: ordered.    Details: CT C-spine shows multilevel degenerative changes without acute abnormality ECG/medicine tests: ordered.    Details: Sinus tachycardia Discussion of management or test interpretation with external provider(s): On recheck, patient resting comfortably.  Remains tachycardic.  Discussed findings with Dr. Estell Harpin.  Will have patient increase her metoprolol to 25 mg twice daily.  She does have  appointment with endocrinology for next week.  Symptomatic treatment for her neck and low back pain.  Return precautions discussed  Risk Prescription drug management.           Final Clinical Impression(s) / ED Diagnoses Final diagnoses:  Neck pain  Tachycardia  Hyperthyroidism    Rx / DC Orders ED Discharge Orders     None         Rosey Bath 01/04/23 1837    Bethann Berkshire, MD 01/05/23 1746

## 2023-01-04 NOTE — ED Triage Notes (Signed)
Per pt, has neck and pain radiating to back x 2 months, has MRI scheduled for August 1, has been seen at [facility] ED on yesterday for same, asked about medicine, and she states it's only works for one day.

## 2023-01-04 NOTE — Discharge Instructions (Addendum)
Your heart rate remains elevated, this is likely a result of your thyroid.  As discussed, stop the metoprolol XL, take the prescription metoprolol twice daily.  Please keep your appointment with your endocrinologist for next week.  Monitor your blood pressure closely.  Alternate ice and heat to your neck.  Take the muscle relaxer as directed, this may cause drowsiness so do not drive or operate machinery while taking the medication.  Follow-up with your primary care provider for recheck if needed.

## 2023-01-08 ENCOUNTER — Telehealth (HOSPITAL_BASED_OUTPATIENT_CLINIC_OR_DEPARTMENT_OTHER): Payer: Self-pay | Admitting: *Deleted

## 2023-01-08 NOTE — Telephone Encounter (Signed)
Post ED Visit - Positive Culture Follow-up  Culture report reviewed by antimicrobial stewardship pharmacist: [facility] []  Enzo Bi, Pharm.D. []  Celedonio Miyamoto, Pharm.D., BCPS AQ-ID []  Garvin Fila, Pharm.D., BCPS []  Georgina Pillion, Pharm.D., BCPS []  [place], [place].D., BCPS, AAHIVP []  Estella Husk, Pharm.D., BCPS, AAHIVP [x]  Lysle Pearl, PharmD, BCPS []  Phillips Climes, PharmD, BCPS []  Agapito Games, PharmD, BCPS []  Verlan Friends, PharmD []  Mervyn Gay, PharmD, BCPS []  Vinnie Level, PharmD  [facility] []  Len Childs, PharmD []  Greer Pickerel, PharmD []  Adalberto Cole, PharmD []  Perlie Gold, Rph []  Lonell Face) Jean Rosenthal, PharmD []  Earl Many, PharmD []  Junita Push, PharmD []  Dorna Leitz, PharmD []  Terrilee Files, PharmD []  Lynann Beaver, PharmD []  Keturah Barre, PharmD []  Loralee Pacas, PharmD []  Bernadene Person, PharmD   Positive urine culture No UTI symptoms, no treatment and no further patient follow-up is required at this time.  Virl Axe [facility] 01/08/2023, 9:46 AM

## 2023-01-09 ENCOUNTER — Encounter: Payer: Self-pay | Admitting: "Endocrinology

## 2023-01-09 ENCOUNTER — Ambulatory Visit: Payer: Medicare HMO | Admitting: "Endocrinology

## 2023-01-09 VITALS — BP 94/58 | HR 108 | Ht 61.0 in | Wt 148.0 lb

## 2023-01-09 DIAGNOSIS — E119 Type 2 diabetes mellitus without complications: Secondary | ICD-10-CM

## 2023-01-09 DIAGNOSIS — I10 Essential (primary) hypertension: Secondary | ICD-10-CM | POA: Diagnosis not present

## 2023-01-09 DIAGNOSIS — E0590 Thyrotoxicosis, unspecified without thyrotoxic crisis or storm: Secondary | ICD-10-CM | POA: Diagnosis not present

## 2023-01-09 DIAGNOSIS — Z7984 Long term (current) use of oral hypoglycemic drugs: Secondary | ICD-10-CM

## 2023-01-09 DIAGNOSIS — E1165 Type 2 diabetes mellitus with hyperglycemia: Secondary | ICD-10-CM | POA: Diagnosis not present

## 2023-01-09 LAB — POCT GLYCOSYLATED HEMOGLOBIN (HGB A1C): HbA1c, POC (controlled diabetic range): 8.7 % — AB (ref 0.0–7.0)

## 2023-01-09 NOTE — Progress Notes (Signed)
01/09/2023, 12:54 PM   Endocrinology follow-up note  Subjective:    Patient ID: Samantha [name], female    DOB: 1946/05/14, PCP Hillery Aldo, NP   Past Medical History:  Diagnosis Date   Allergic rhinitis    Arthritis of both knees    Arthritis of both shoulder regions    Bilateral carpal tunnel syndrome    Cataract    Chronic low back pain    CKD (chronic kidney disease), stage III (HCC)    Decreased vision    Diabetes mellitus    HTN (hypertension)    Hyperlipidemia    Morbid obesity (HCC)    Thyroid disease    Past Surgical History:  Procedure Laterality Date   CATARACT EXTRACTION     COLONOSCOPY     COLONOSCOPY N/A 09/15/2020   Procedure: COLONOSCOPY;  Surgeon: Corbin Ade, MD;  [facility];  Service: Endoscopy;  Laterality: N/A;  ASA II / PM procedure   POLYPECTOMY  09/15/2020   Procedure: POLYPECTOMY;  Surgeon: Corbin Ade, MD;  Location[facility];  Service: Endoscopy;;   TUBAL LIGATION     Social History   Socioeconomic History   Marital status: Widowed    Spouse name: Not on file   Number of children: Not on file   Years of education: Not on file   Highest education level: Not on file  Occupational History   Not on file  Tobacco Use   Smoking status: Never   Smokeless tobacco: Never  Vaping Use   Vaping status: Never Used  Substance and Sexual Activity   Alcohol use: Yes    Comment: occasionally   Drug use: No   Sexual activity: Yes    Birth control/protection: Surgical    Comment: tubal  Other Topics Concern   Not on file  Social History Narrative   Not on file   Social Determinants of Health   Financial Resource Strain: Low Risk  (11/16/2022)   Overall Financial Resource Strain (CARDIA)    Difficulty of Paying Living Expenses: Not very hard  Food Insecurity: No Food Insecurity (11/16/2022)   Hunger Vital Sign    Worried About Running Out of Food in the Last Year: Never  true    Ran Out of Food in the Last Year: Never true  Transportation Needs: No Transportation Needs (11/16/2022)   PRAPARE - Administrator, Civil Service (Medical): No    Lack of Transportation (Non-Medical): No  Physical Activity: Inactive (11/16/2022)   Exercise Vital Sign    Days of Exercise per Week: 0 days    Minutes of Exercise per Session: 0 min  Stress: No Stress Concern Present (11/16/2022)   [employer] of Occupational Health - Occupational Stress Questionnaire    Feeling of Stress : Only a little  Social Connections: Moderately Integrated (11/16/2022)   Social Connection and Isolation Panel [NHANES]    Frequency of Communication with Friends and Family: Three times a week    Frequency of Social Gatherings with Friends and Family: Once a week    Attends Religious Services: More than 4 times per year    Active Member of Clubs or Organizations: Yes  Attends Banker Meetings: 1 to 4 times per year    Marital Status: Widowed   Family History  Problem Relation Age of Onset   Hypertension Father    Hypertension Mother    Diabetes Mother    Breast cancer Mother    Hypertension Sister    Cancer Sister        pancreatic   Cancer Sister        pancreatic   Liver disease Sister    Outpatient Encounter Medications as of 01/09/2023  Medication Sig   acetaminophen (TYLENOL) 500 MG tablet Take 500-1,000 mg by mouth every 6 (six) hours as needed for mild pain.   aspirin EC 81 MG tablet Take 81 mg by mouth daily.   cyclobenzaprine (FLEXERIL) 5 MG tablet    gabapentin (NEURONTIN) 300 MG capsule Take 300 mg by mouth 3 (three) times daily.   JARDIANCE 25 MG TABS tablet Take 25 mg by mouth daily.   lisinopril (ZESTRIL) 20 MG tablet Take 20 mg by mouth every morning.   meclizine (ANTIVERT) 25 MG tablet Take 25 mg by mouth 3 (three) times daily as needed for dizziness.   metFORMIN (GLUCOPHAGE-XR) 500 MG 24 hr tablet Take 500 mg by mouth daily.   methocarbamol  (ROBAXIN) 500 MG tablet Take 1 tablet (500 mg total) by mouth 3 (three) times daily. May cause drowsiness.  Do not operate machinery or drive while taking this medication   metoprolol tartrate (LOPRESSOR) 25 MG tablet Take 1 tablet (25 mg total) by mouth 2 (two) times daily.   OVER THE COUNTER MEDICATION Take 1 tablet by mouth at bedtime as needed (leg cramps). legatrin pm   oxyCODONE-acetaminophen (PERCOCET/ROXICET) 5-325 MG tablet Take 1 tablet by mouth every 6 (six) hours as needed for severe pain.   triamcinolone ointment (KENALOG) 0.5 % Apply thin layer 2 x daily as needed to vulva for itching (Patient not taking: Reported on 01/04/2023)   [DISCONTINUED] amLODipine (NORVASC) 10 MG tablet Take 10 mg by mouth daily.   [DISCONTINUED] MEDROL 4 MG TBPK tablet Take by mouth. (Patient not taking: Reported on 01/04/2023)   [DISCONTINUED] OZEMPIC, 0.25 OR 0.5 MG/DOSE, 2 MG/3ML SOPN Inject into the skin. (Patient not taking: Reported on 11/16/2022)   No facility-administered encounter medications on file as of 01/09/2023.   ALLERGIES: No Known Allergies  VACCINATION STATUS: Immunization History  Administered Date(s) Administered   H1N1 06/30/2008   Influenza Whole 03/12/2006, 06/03/2007   Moderna Sars-Covid-2 Vaccination 07/20/2019, 08/20/2019   Pneumococcal Polysaccharide-23 01/22/2007    HPI Samantha [name] is 77 y.o. female who presents today with a medical history as above. she is being seen in follow-up after she was seen in consultation for hyperthyroidism requested by Hillery Aldo, NP.  History is obtained directly from the patient and chart review.  Patient reports that she was informed of hyperthyroidism several weeks ago.  She reports 30 pounds of weight loss over 6 months, intermittent palpitations, tremors, and heat intolerance.  She is not on antithyroid intervention at this time.  She denies family history of thyroid problems.   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  She does not have time family history of thyroid malignancy.  She presents with 5 to 5 pounds of weight loss.  Her other medical problems include hypertension on treatment with amlodipine and lisinopril.  She is also on Jardiance and metformin  for type 2 diabetes.  Her most recent A1c was 6.8%.  She has Lipitor 40 mg p.o. nightly for hyperlipidemia.  Review of Systems  Constitutional: +mildly fluctuating body weight with recent weight  loss, no fatigue, no subjective hyperthermia, no subjective hypothermia Eyes: no blurry vision, no xerophthalmia  Neurological: no tremors, no numbness, no tingling, no dizziness Psychiatric: no depression, + anxiety  Objective:       01/09/2023   10:54 AM 01/04/2023    4:15 PM 01/04/2023    1:12 PM  Vitals with BMI  Height 5\' 1"     Weight 148 lbs    BMI 27.98    Systolic 94 120 122  Diastolic 58 70 61  Pulse 108 120 122    BP (!) 94/58   Pulse (!) 108   Ht 5\' 1"  (1.549 m)   Wt 148 lb (67.1 kg)   BMI 27.96 kg/m   Wt Readings from Last 3 Encounters:  01/09/23 148 lb (67.1 kg)  01/04/23 162 lb (73.5 kg)  11/16/22 172 lb (78 kg)    Physical Exam  Constitutional:  Body mass index is 27.96 kg/m.,  not in acute distress, + anxious state of mind.   Eyes: PERRLA, EOMI, no exophthalmos ENT: moist mucous membranes, +gross thyromegaly, no gross cervical lymphadenopathy Cardiovascular: normal precordial activity, Regular Rate and Rhythm, no Murmur/Rubs/Gallops   CMP ( most recent) CMP     Component Value Date/Time   NA 133 (L) 01/04/2023 1556   K 3.7 01/04/2023 1556   CL 98 01/04/2023 1556   CO2 25 01/04/2023 1556   GLUCOSE 269 (H) 01/04/2023 1556   BUN 20 01/04/2023 1556   CREATININE 0.96 01/04/2023 1556   CALCIUM 10.0 01/04/2023 1556   PROT 7.0 07/29/2022 1120   ALBUMIN 3.5 07/29/2022 1120   AST 15 07/29/2022 1120   ALT 16  07/29/2022 1120   ALKPHOS 103 07/29/2022 1120   BILITOT 0.7 07/29/2022 1120   GFRNONAA >60 01/04/2023 1556   GFRAA 55 (L) 05/08/2018 2247     Diabetic Labs (most recent): Lab Results  Component Value Date   HGBA1C 8.7 (A) 01/09/2023   HGBA1C 6.8 01/01/2009   HGBA1C 7.0 10/02/2008   MICROALBUR 1.54 02/09/2009   MICROALBUR 0.64 01/16/2008   MICROALBUR 1.69 12/03/2006     Lipid Panel ( most recent) Lipid Panel     Component Value Date/Time   CHOL 166 02/09/2009 2026   TRIG 86 02/09/2009 2026   HDL 60 02/09/2009 2026   CHOLHDL 2.8 Ratio 02/09/2009 2026   VLDL 17 02/09/2009 2026   LDLCALC 89 02/09/2009 2026      Lab Results  Component Value Date   TSH <0.010 (L) 01/04/2023   TSH <0.01 (L) 10/06/2022   TSH 1.527 02/09/2009   TSH 1.318 01/16/2008   TSH 1.499 12/03/2006   FREET4 >5.50 (H) 01/04/2023   FREET4 2.6 (H) 10/06/2022     Thyroid uptake and scan on Oct 20, 2022 FINDINGS: Unremarkable LEFT thyroid lobe.   Enlarged RIGHT thyroid lobe containing warm and cold nodules.   Patient has previously undergone biopsy of an indeterminate nodule in the RIGHT lobe.   4 hour I-123 uptake = 29.9% (normal 5-20%), 24 hour I-123 uptake = 59.4% (normal 10-30%)   IMPRESSION: Enlarged multinodular thyroid gland especially RIGHT lobe. Elevated 4 hour and 24 hour radio iodine uptakes consistent with hyperthyroidism.  Nuclear medicine radioactive iodine therapy for hyperthyroidism on November 28, 2022 IMPRESSION: Per oral administration of I-131 sodium iodide  for the treatment of hyperthyroidism.  Assessment & Plan:   1. Hyperthyroidism 2.  Toxic multinodular goiter 3.  Type 2 diabetes  - I have reviewed her new thyroid function tests, thyroid uptake and scan, and available thyroid records and clinically evaluated the patient. - Based on these reviews, she has toxic multinodular goiter.   -Due to evident thyroid hormone burden, and confirmatory, she was considered for  radioactive iodine thyroid ablation which she received on November 28, 2022.  Her subsequent thyroid function tests are consistent with hypothyroidism.  She may be a late responder.  She will not be initiated on thyroid hormone at this time.  She will return in 8 weeks with repeat thyroid function test.      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   She will return in 9 weeks with repeat labs for reassessment In light of her metabolic dysfunction indicated by type 2 diabetes, hypertension, hyperlipidemia.her diabetes is not controlled with point-of-care A1c of 8.7% today.  She is advised to continue her metformin 500 mg XR p.o. daily at breakfast, Jardiance 25 mg p.o. daily at breakfast.  She was taken off of Ozempic due to unintended weight loss which was worsened by hyperthyroidism.  She is running hypotension, was advised to discontinue amlodipine for now.  She still has medications for hypertension including metoprolol 25 mg p.o. twice daily, lisinopril 20 mg p.o. daily.   She is also advised to continue Lipitor 40 mg p.o. nightly for hyperlipidemia and amlodipine and lisinopril for hypertension.  She is advised to maintain close follow-up with her PCP.   - she is advised to maintain close follow up with Hillery Aldo, NP for primary care needs.    I spent  26  minutes in the care of the patient today including review of labs from Thyroid Function, CMP, and other relevant labs ; imaging/biopsy records (current and previous including abstractions from other facilities); face-to-face time discussing  her lab results and symptoms, medications doses, her options of short and long term treatment based on the latest standards of care / guidelines;   and documenting the encounter.  Samantha [name]  participated in the discussions, expressed understanding, and voiced agreement with the above plans.  All questions were answered to her satisfaction. she is encouraged to contact clinic  should she have any questions or concerns prior to her return visit.   Follow up plan: Return in about 8 weeks (around 03/06/2023) for F/U with Pre-visit Labs.   Marquis Lunch, MD [facility] [address] [place], [place] [address] Phone: [phone]  Fax: [phone]     01/09/2023, 12:54 PM  This note was partially dictated with voice recognition software. Similar sounding words can be transcribed inadequately or may not  be corrected upon review.

## 2023-01-19 ENCOUNTER — Emergency Department (HOSPITAL_COMMUNITY): Payer: Medicare HMO

## 2023-01-19 ENCOUNTER — Other Ambulatory Visit: Payer: Self-pay

## 2023-01-19 ENCOUNTER — Emergency Department (HOSPITAL_COMMUNITY)
Admission: EM | Admit: 2023-01-19 | Discharge: 2023-01-19 | Disposition: A | Discharge status: Home / Self Care | Payer: Medicare HMO | Attending: Emergency Medicine | Admitting: Emergency Medicine

## 2023-01-19 ENCOUNTER — Encounter (HOSPITAL_COMMUNITY): Payer: Self-pay | Admitting: *Deleted

## 2023-01-19 DIAGNOSIS — R000 Tachycardia, unspecified: Secondary | ICD-10-CM | POA: Diagnosis not present

## 2023-01-19 DIAGNOSIS — R112 Nausea with vomiting, unspecified: Secondary | ICD-10-CM | POA: Insufficient documentation

## 2023-01-19 DIAGNOSIS — I129 Hypertensive chronic kidney disease with stage 1 through stage 4 chronic kidney disease, or unspecified chronic kidney disease: Secondary | ICD-10-CM | POA: Insufficient documentation

## 2023-01-19 DIAGNOSIS — E119 Type 2 diabetes mellitus without complications: Secondary | ICD-10-CM | POA: Insufficient documentation

## 2023-01-19 DIAGNOSIS — M545 Low back pain, unspecified: Secondary | ICD-10-CM | POA: Insufficient documentation

## 2023-01-19 DIAGNOSIS — R1110 Vomiting, unspecified: Secondary | ICD-10-CM

## 2023-01-19 DIAGNOSIS — N183 Chronic kidney disease, stage 3 unspecified: Secondary | ICD-10-CM | POA: Insufficient documentation

## 2023-01-19 DIAGNOSIS — Z20822 Contact with and (suspected) exposure to covid-19: Secondary | ICD-10-CM | POA: Insufficient documentation

## 2023-01-19 LAB — COMPREHENSIVE METABOLIC PANEL
ALT: 18 U/L (ref 0–44)
AST: 18 U/L (ref 15–41)
Albumin: 2.9 g/dL — ABNORMAL LOW (ref 3.5–5.0)
Alkaline Phosphatase: 99 U/L (ref 38–126)
Anion gap: 14 (ref 5–15)
BUN: 26 mg/dL — ABNORMAL HIGH (ref 8–23)
CO2: 23 mmol/L (ref 22–32)
Calcium: 11.7 mg/dL — ABNORMAL HIGH (ref 8.9–10.3)
Chloride: 102 mmol/L (ref 98–111)
Creatinine, Ser: 1.27 mg/dL — ABNORMAL HIGH (ref 0.44–1.00)
GFR, Estimated: 44 mL/min — ABNORMAL LOW (ref >60)
Glucose, Bld: 381 mg/dL — ABNORMAL HIGH (ref 70–99)
Potassium: 3.6 mmol/L (ref 3.5–5.1)
Sodium: 139 mmol/L (ref 135–145)
Total Bilirubin: 0.5 mg/dL (ref 0.3–1.2)
Total Protein: 7.3 g/dL (ref 6.5–8.1)

## 2023-01-19 LAB — CBC
HCT: 39.2 % (ref 36.0–46.0)
Hemoglobin: 11.8 g/dL — ABNORMAL LOW (ref 12.0–15.0)
MCH: 25.4 pg — ABNORMAL LOW (ref 26.0–34.0)
MCHC: 30.1 g/dL (ref 30.0–36.0)
MCV: 84.3 fL (ref 80.0–100.0)
Platelets: 472 10*3/uL — ABNORMAL HIGH (ref 150–400)
RBC: 4.65 MIL/uL (ref 3.87–5.11)
RDW: 15.7 % — ABNORMAL HIGH (ref 11.5–15.5)
WBC: 8.7 10*3/uL (ref 4.0–10.5)
nRBC: 0 % (ref 0.0–0.2)

## 2023-01-19 LAB — LIPASE, BLOOD: Lipase: 106 U/L — ABNORMAL HIGH (ref 11–51)

## 2023-01-19 LAB — URINALYSIS, ROUTINE W REFLEX MICROSCOPIC
Bilirubin Urine: NEGATIVE
Glucose, UA: >=500 mg/dL — AB
Hgb urine dipstick: NEGATIVE
Ketones, ur: NEGATIVE mg/dL
Nitrite: NEGATIVE
Protein, ur: NEGATIVE mg/dL
Specific Gravity, Urine: 1.022 (ref 1.005–1.030)
pH: 5 (ref 5.0–8.0)

## 2023-01-19 LAB — RESP PANEL BY RT-PCR (RSV, FLU A&B, COVID)  RVPGX2
Influenza A by PCR: NEGATIVE
Influenza B by PCR: NEGATIVE
Resp Syncytial Virus by PCR: NEGATIVE
SARS Coronavirus 2 by RT PCR: NEGATIVE

## 2023-01-19 MED ORDER — SODIUM CHLORIDE 0.9 % IV SOLN
1.0000 g | Freq: Once | INTRAVENOUS | Status: AC
  Administered 2023-01-19: 1 g via INTRAVENOUS
  Filled 2023-01-19: qty 10

## 2023-01-19 MED ORDER — METOPROLOL TARTRATE 5 MG/5ML IV SOLN
10.0000 mg | Freq: Once | INTRAVENOUS | Status: AC
  Administered 2023-01-19: 10 mg via INTRAVENOUS
  Filled 2023-01-19: qty 10

## 2023-01-19 MED ORDER — SODIUM CHLORIDE 0.9 % IV BOLUS
1000.0000 mL | Freq: Once | INTRAVENOUS | Status: AC
  Administered 2023-01-19: 1000 mL via INTRAVENOUS

## 2023-01-19 MED ORDER — ONDANSETRON 4 MG PO TBDP
8.0000 mg | ORAL_TABLET | Freq: Once | ORAL | Status: AC
  Administered 2023-01-19: 8 mg via ORAL
  Filled 2023-01-19: qty 2

## 2023-01-19 MED ORDER — IOHEXOL 350 MG/ML SOLN
60.0000 mL | Freq: Once | INTRAVENOUS | Status: AC | PRN
  Administered 2023-01-19: 60 mL via INTRAVENOUS

## 2023-01-19 MED ORDER — ONDANSETRON HCL 4 MG PO TABS: 4.0000 mg | ORAL_TABLET | Freq: Three times a day (TID) | ORAL | Status: DC | PRN

## 2023-01-19 NOTE — ED Triage Notes (Signed)
She was tested for covid yesterday and it was negative

## 2023-01-19 NOTE — ED Triage Notes (Signed)
The pt has been ill for 2 months with abd pain nausea and vomiting  she saw her doctor 2 days ago for the same unklnown temp

## 2023-01-19 NOTE — ED Notes (Signed)
Waiting on Abt to finish then D/C

## 2023-01-19 NOTE — Discharge Instructions (Signed)
You were seen today for nausea, vomiting, blood in your vomit. While you were here we monitored your vitals, preformed a physical exam, and checked blood work and imaging. These were all reassuring and there is no indication for any further testing or intervention in the emergency department at this time.   Things to do:  - Follow up with your primary care provider within the next 1-2 weeks -We have prescribed you with a new medication to be taken as needed for nausea.  Please be sure to take this medication as prescribed  Return to the emergency department if you have any new or worsening symptoms including severe vomiting, copious blood in your vomit, severe abdominal pain, chest pain, shortness of breath, or if you have any other concerns.

## 2023-01-19 NOTE — ED Provider Notes (Signed)
[facility] EMERGENCY DEPARTMENT AT [facility] Provider Note  MDM   HPI/ROS:  Samantha [name] is a 77 y.o. female with past medical history of hypertension, hyperthyroidism, type 2 diabetes, CKD presenting with chief complaint of nausea, vomiting, back pain, and potential hematemesis.  Patient has been experiencing lower back pain for months now and has received an MRI recently and been worked up by a Midwife.  There have been no acute changes in her back pain.  She denies recent fevers.  Patient also has been experiencing nausea and vomiting over the past few weeks with several episodes of emesis.  Most recent episode of emesis earlier today contained a small amount of red material concerning for blood.  This prompted her to present to the emergency department for further evaluation.  She denies syncope/presyncope, chest pain, shortness of breath fevers, abdominal pain, numbness/weakness, dysuria, urinary frequency/urgency, urinary retention.  Of note, patient was recently diagnosed with a urinary tract infection has been taking Bactrim.  Physical exam is notable for: - Overall well-appearing, no acute distress - Cardiopulmonary exam unremarkable - No tenderness to palpation of abdomen - No CVA tenderness  On my initial evaluation, patient is:  -Vital signs notable for hypertension with systolic pressures in the 160s, tachycardia with rates in the 120s to 130s.  Non-toxic appearing. -Additional history obtained from daughter at bedside, chart reviewed  Given the patient's history and physical exam, differential diagnosis includes but is not limited to upper GI bleed, urinary tract infection, diverticulitis, appendicitis, pulmonary embolism, viral infection, etc.  Initial workup to include EKG, CBC, CMP, lipase, urinalysis, COVID flu.  Interpretations, interventions, and the patient's course of care are documented below.    Initial labs resulted with no leukocytosis, hemoglobin of  11.8 at baseline.  He with creatinine of 1.27, mildly elevated from baseline.  Liter bolus administered.  Lipase of 106.  CT abdomen pelvis with contrast ordered.  Urinalysis resulted with bacteria and leukocytes.  Administered single dose of Rocephin.  Upon reassessment, patient resting comfortably with no episodes of emesis.  Complaining of mild nausea.  Administered Zofran.  Lopressor administered given ongoing tachycardia and hypertension.  Given persistent tachycardia, CT PE scan ordered to further evaluate.  CT imaging resulted with no acute findings.  Upon reassessment, patient resting comfortably with no new symptoms.  No emesis, sleeping comfortably with no nausea.  Heart rate mildly improved with rates in the 110s.  Hypersympathetic vital signs likely related to chronic hypothyroidism.  Given that patient has remained asymptomatic, workup is unremarkable, and vital signs are relatively stable, and patient deemed safe and stable for discharge.  Provided with outpatient prescription for Zofran as needed for nausea.  Strict return precautions voiced.  Instructed follow-up with primary care provider for further management.  Disposition:  I discussed the plan for discharge with the patient and/or their surrogate at bedside prior to discharge and they were in agreement with the plan and verbalized understanding of the return precautions provided. All questions answered to the best of my ability. Ultimately, the patient was discharged in stable condition with stable vital signs. I am reassured that they are capable of close follow up and good social support at home.   Clinical Impression:  1. Vomiting, unspecified vomiting type, unspecified whether nausea present     Rx / DC Orders ED Discharge Orders          Ordered    ondansetron (ZOFRAN) 4 MG tablet  Every 8 hours PRN  01/19/23 1943            The plan for this patient was discussed with Dr. Silverio Lay, who voiced agreement and who  oversaw evaluation and treatment of this patient.   Clinical Complexity A medically appropriate history, review of systems, and physical exam was performed.  My independent interpretations of EKG, labs, and radiology are documented in the ED course above.   Click here for ABCD2, HEART and other calculatorsREFRESH Note before signing   Patient's presentation is most consistent with acute presentation with potential threat to life or bodily function.  Medical Decision Making Amount and/or Complexity of Data Reviewed Radiology: ordered.  Risk Prescription drug management.    HPI/ROS      See MDM section for pertinent HPI and ROS. A complete ROS was performed with pertinent positives/negatives noted above.   Past Medical History:  Diagnosis Date   Allergic rhinitis    Arthritis of both knees    Arthritis of both shoulder regions    Bilateral carpal tunnel syndrome    Cataract    Chronic low back pain    CKD (chronic kidney disease), stage III (HCC)    Decreased vision    Diabetes mellitus    HTN (hypertension)    Hyperlipidemia    Morbid obesity (HCC)    Thyroid disease     Past Surgical History:  Procedure Laterality Date   CATARACT EXTRACTION     COLONOSCOPY     COLONOSCOPY N/A 09/15/2020   Procedure: COLONOSCOPY;  Surgeon: Corbin Ade, MD;  [facility];  Service: Endoscopy;  Laterality: N/A;  ASA II / PM procedure   POLYPECTOMY  09/15/2020   Procedure: POLYPECTOMY;  Surgeon: Corbin Ade, MD;  [facility]: [facility];  Service: Endoscopy;;   TUBAL LIGATION        Physical Exam   Vitals:   01/19/23 1600 01/19/23 1700 01/19/23 1800 01/19/23 1947  BP: (!) 168/83 (!) 163/76 (!) 172/88   Pulse: (!) 129 (!) 119 (!) 118   Temp: 98.6 F (37 C)   98.1 F (36.7 C)  TempSrc: Oral   Oral  SpO2: 100% 100% 100%   Weight:      Height:        Physical Exam Vitals and nursing note reviewed.  Constitutional:      General: She is not in acute  distress.    Appearance: She is well-developed.  HENT:     Head: Normocephalic and atraumatic.  Eyes:     Conjunctiva/sclera: Conjunctivae normal.  Cardiovascular:     Rate and Rhythm: Regular rhythm. Tachycardia present.     Heart sounds: No murmur heard. Pulmonary:     Effort: Pulmonary effort is normal. No respiratory distress.     Breath sounds: Normal breath sounds.  Abdominal:     Palpations: Abdomen is soft.     Tenderness: There is no abdominal tenderness.  Musculoskeletal:        General: No swelling.     Cervical back: Neck supple.  Skin:    General: Skin is warm and dry.  Neurological:     General: No focal deficit present.     Mental Status: She is alert and oriented to person, place, and time.  Psychiatric:        Mood and Affect: Mood normal.     Starleen Arms, MD Department of Emergency Medicine   Please note that this documentation was produced with the assistance of voice-to-text technology and may  contain errors.    Dyanne Iha, MD 01/20/23 Atlee Abide    Charlynne Pander, MD 01/21/23 1452

## 2023-01-23 ENCOUNTER — Emergency Department (HOSPITAL_COMMUNITY): Payer: Medicare HMO

## 2023-01-23 ENCOUNTER — Inpatient Hospital Stay (HOSPITAL_COMMUNITY)
Admission: EM | Admit: 2023-01-23 | Discharge: 2023-02-01 | DRG: 637 | Disposition: A | Discharge status: Skilled Nursing Facility | Payer: Medicare HMO | Attending: Internal Medicine | Admitting: Internal Medicine

## 2023-01-23 ENCOUNTER — Inpatient Hospital Stay (HOSPITAL_COMMUNITY): Payer: Medicare HMO

## 2023-01-23 ENCOUNTER — Other Ambulatory Visit: Payer: Self-pay

## 2023-01-23 ENCOUNTER — Encounter (HOSPITAL_COMMUNITY): Payer: Self-pay

## 2023-01-23 ENCOUNTER — Ambulatory Visit (HOSPITAL_COMMUNITY): Admission: RE | Admit: 2023-01-23 | Payer: Medicare HMO | Source: Ambulatory Visit

## 2023-01-23 ENCOUNTER — Ambulatory Visit (HOSPITAL_COMMUNITY): Payer: Medicare HMO | Attending: Physical Therapy | Admitting: Physical Therapy

## 2023-01-23 DIAGNOSIS — G9341 Metabolic encephalopathy: Secondary | ICD-10-CM | POA: Diagnosis present

## 2023-01-23 DIAGNOSIS — I4719 Other supraventricular tachycardia: Secondary | ICD-10-CM | POA: Diagnosis not present

## 2023-01-23 DIAGNOSIS — R1312 Dysphagia, oropharyngeal phase: Secondary | ICD-10-CM | POA: Diagnosis present

## 2023-01-23 DIAGNOSIS — D649 Anemia, unspecified: Secondary | ICD-10-CM | POA: Diagnosis present

## 2023-01-23 DIAGNOSIS — I119 Hypertensive heart disease without heart failure: Secondary | ICD-10-CM | POA: Diagnosis present

## 2023-01-23 DIAGNOSIS — Z79899 Other long term (current) drug therapy: Secondary | ICD-10-CM

## 2023-01-23 DIAGNOSIS — N179 Acute kidney failure, unspecified: Secondary | ICD-10-CM

## 2023-01-23 DIAGNOSIS — N390 Urinary tract infection, site not specified: Secondary | ICD-10-CM | POA: Diagnosis present

## 2023-01-23 DIAGNOSIS — Z803 Family history of malignant neoplasm of breast: Secondary | ICD-10-CM

## 2023-01-23 DIAGNOSIS — G8929 Other chronic pain: Secondary | ICD-10-CM | POA: Diagnosis present

## 2023-01-23 DIAGNOSIS — I959 Hypotension, unspecified: Secondary | ICD-10-CM | POA: Diagnosis present

## 2023-01-23 DIAGNOSIS — Z1152 Encounter for screening for COVID-19: Secondary | ICD-10-CM | POA: Diagnosis not present

## 2023-01-23 DIAGNOSIS — Z800 Family history of malignant neoplasm of digestive organs: Secondary | ICD-10-CM

## 2023-01-23 DIAGNOSIS — I493 Ventricular premature depolarization: Secondary | ICD-10-CM | POA: Diagnosis not present

## 2023-01-23 DIAGNOSIS — E440 Moderate protein-calorie malnutrition: Secondary | ICD-10-CM | POA: Insufficient documentation

## 2023-01-23 DIAGNOSIS — A419 Sepsis, unspecified organism: Secondary | ICD-10-CM | POA: Diagnosis not present

## 2023-01-23 DIAGNOSIS — Z87440 Personal history of urinary (tract) infections: Secondary | ICD-10-CM

## 2023-01-23 DIAGNOSIS — E860 Dehydration: Secondary | ICD-10-CM | POA: Diagnosis present

## 2023-01-23 DIAGNOSIS — E870 Hyperosmolality and hypernatremia: Secondary | ICD-10-CM | POA: Diagnosis present

## 2023-01-23 DIAGNOSIS — I2489 Other forms of acute ischemic heart disease: Secondary | ICD-10-CM | POA: Diagnosis present

## 2023-01-23 DIAGNOSIS — E878 Other disorders of electrolyte and fluid balance, not elsewhere classified: Secondary | ICD-10-CM | POA: Diagnosis present

## 2023-01-23 DIAGNOSIS — R4182 Altered mental status, unspecified: Secondary | ICD-10-CM | POA: Diagnosis present

## 2023-01-23 DIAGNOSIS — T383X5A Adverse effect of insulin and oral hypoglycemic [antidiabetic] drugs, initial encounter: Secondary | ICD-10-CM | POA: Diagnosis present

## 2023-01-23 DIAGNOSIS — I471 Supraventricular tachycardia, unspecified: Secondary | ICD-10-CM | POA: Diagnosis not present

## 2023-01-23 DIAGNOSIS — Z6826 Body mass index (BMI) 26.0-26.9, adult: Secondary | ICD-10-CM

## 2023-01-23 DIAGNOSIS — E785 Hyperlipidemia, unspecified: Secondary | ICD-10-CM | POA: Diagnosis present

## 2023-01-23 DIAGNOSIS — E119 Type 2 diabetes mellitus without complications: Secondary | ICD-10-CM

## 2023-01-23 DIAGNOSIS — M545 Low back pain, unspecified: Secondary | ICD-10-CM | POA: Diagnosis present

## 2023-01-23 DIAGNOSIS — E0520 Thyrotoxicosis with toxic multinodular goiter without thyrotoxic crisis or storm: Secondary | ICD-10-CM | POA: Diagnosis present

## 2023-01-23 DIAGNOSIS — E876 Hypokalemia: Secondary | ICD-10-CM | POA: Diagnosis not present

## 2023-01-23 DIAGNOSIS — Z7984 Long term (current) use of oral hypoglycemic drugs: Secondary | ICD-10-CM

## 2023-01-23 DIAGNOSIS — Z7982 Long term (current) use of aspirin: Secondary | ICD-10-CM

## 2023-01-23 DIAGNOSIS — E1110 Type 2 diabetes mellitus with ketoacidosis without coma: Secondary | ICD-10-CM | POA: Diagnosis not present

## 2023-01-23 DIAGNOSIS — Z8249 Family history of ischemic heart disease and other diseases of the circulatory system: Secondary | ICD-10-CM

## 2023-01-23 DIAGNOSIS — Z833 Family history of diabetes mellitus: Secondary | ICD-10-CM

## 2023-01-23 DIAGNOSIS — I480 Paroxysmal atrial fibrillation: Secondary | ICD-10-CM | POA: Diagnosis not present

## 2023-01-23 DIAGNOSIS — Z7901 Long term (current) use of anticoagulants: Secondary | ICD-10-CM

## 2023-01-23 DIAGNOSIS — E872 Acidosis, unspecified: Secondary | ICD-10-CM

## 2023-01-23 DIAGNOSIS — R410 Disorientation, unspecified: Principal | ICD-10-CM

## 2023-01-23 LAB — I-STAT CG4 LACTIC ACID, ED
Lactic Acid, Venous: 3.9 mmol/L (ref 0.5–1.9)
Lactic Acid, Venous: 1.9 mmol/L (ref 0.5–1.9)

## 2023-01-23 LAB — COMPREHENSIVE METABOLIC PANEL WITH GFR
ALT: 20 U/L (ref 0–44)
AST: 23 U/L (ref 15–41)
Albumin: 3.3 g/dL — ABNORMAL LOW (ref 3.5–5.0)
Alkaline Phosphatase: 88 U/L (ref 38–126)
Anion gap: 24 — ABNORMAL HIGH (ref 5–15)
BUN: 31 mg/dL — ABNORMAL HIGH (ref 8–23)
CO2: 13 mmol/L — ABNORMAL LOW (ref 22–32)
Calcium: 12.4 mg/dL — ABNORMAL HIGH (ref 8.9–10.3)
Chloride: 118 mmol/L — ABNORMAL HIGH (ref 98–111)
Creatinine, Ser: 1.98 mg/dL — ABNORMAL HIGH (ref 0.44–1.00)
GFR, Estimated: 26 mL/min — ABNORMAL LOW (ref >60)
Glucose, Bld: 297 mg/dL — ABNORMAL HIGH (ref 70–99)
Potassium: 3.6 mmol/L (ref 3.5–5.1)
Sodium: 155 mmol/L — ABNORMAL HIGH (ref 135–145)
Total Bilirubin: 1 mg/dL (ref 0.3–1.2)
Total Protein: 7.7 g/dL (ref 6.5–8.1)

## 2023-01-23 LAB — CBC
HCT: 42.3 % (ref 36.0–46.0)
Hemoglobin: 12.2 g/dL (ref 12.0–15.0)
MCH: 24.5 pg — ABNORMAL LOW (ref 26.0–34.0)
MCHC: 28.8 g/dL — ABNORMAL LOW (ref 30.0–36.0)
MCV: 84.9 fL (ref 80.0–100.0)
Platelets: 241 10*3/uL (ref 150–400)
RBC: 4.98 MIL/uL (ref 3.87–5.11)
RDW: 16.5 % — ABNORMAL HIGH (ref 11.5–15.5)
WBC: 12.9 10*3/uL — ABNORMAL HIGH (ref 4.0–10.5)
nRBC: 0.2 % (ref 0.0–0.2)

## 2023-01-23 LAB — CBC WITH DIFFERENTIAL/PLATELET
Abs Immature Granulocytes: 0.09 10*3/uL — ABNORMAL HIGH (ref 0.00–0.07)
Basophils Absolute: 0 10*3/uL (ref 0.0–0.1)
Basophils Relative: 0 %
Eosinophils Absolute: 0 10*3/uL (ref 0.0–0.5)
Eosinophils Relative: 0 %
HCT: 45.4 % (ref 36.0–46.0)
Hemoglobin: 13.2 g/dL (ref 12.0–15.0)
Immature Granulocytes: 1 %
Lymphocytes Relative: 9 %
Lymphs Abs: 1 10*3/uL (ref 0.7–4.0)
MCH: 24.7 pg — ABNORMAL LOW (ref 26.0–34.0)
MCHC: 29.1 g/dL — ABNORMAL LOW (ref 30.0–36.0)
MCV: 85 fL (ref 80.0–100.0)
Monocytes Absolute: 0.8 10*3/uL (ref 0.1–1.0)
Monocytes Relative: 7 %
Neutro Abs: 9.8 10*3/uL — ABNORMAL HIGH (ref 1.7–7.7)
Neutrophils Relative %: 83 %
Platelets: 397 10*3/uL (ref 150–400)
RBC: 5.34 MIL/uL — ABNORMAL HIGH (ref 3.87–5.11)
RDW: 16.5 % — ABNORMAL HIGH (ref 11.5–15.5)
WBC: 11.7 10*3/uL — ABNORMAL HIGH (ref 4.0–10.5)
nRBC: 0.3 % — ABNORMAL HIGH (ref 0.0–0.2)

## 2023-01-23 LAB — T4, FREE: Free T4: 4.47 ng/dL — ABNORMAL HIGH (ref 0.61–1.12)

## 2023-01-23 LAB — BASIC METABOLIC PANEL
Anion gap: 25 — ABNORMAL HIGH (ref 5–15)
BUN: 34 mg/dL — ABNORMAL HIGH (ref 8–23)
CO2: 13 mmol/L — ABNORMAL LOW (ref 22–32)
Calcium: 11.5 mg/dL — ABNORMAL HIGH (ref 8.9–10.3)
Chloride: 123 mmol/L — ABNORMAL HIGH (ref 98–111)
Creatinine, Ser: 1.84 mg/dL — ABNORMAL HIGH (ref 0.44–1.00)
GFR, Estimated: 28 mL/min — ABNORMAL LOW (ref >60)
Glucose, Bld: 216 mg/dL — ABNORMAL HIGH (ref 70–99)
Potassium: 3.8 mmol/L (ref 3.5–5.1)
Sodium: 161 mmol/L (ref 135–145)

## 2023-01-23 LAB — SARS CORONAVIRUS 2 BY RT PCR: SARS Coronavirus 2 by RT PCR: NEGATIVE

## 2023-01-23 LAB — BLOOD GAS, VENOUS
Acid-base deficit: 11.5 mmol/L — ABNORMAL HIGH (ref 0.0–2.0)
Bicarbonate: 13.1 mmol/L — ABNORMAL LOW (ref 20.0–28.0)
O2 Saturation: 78.4 %
Patient temperature: 36.4
pCO2, Ven: 25 mmHg — ABNORMAL LOW (ref 44–60)
pH, Ven: 7.32 (ref 7.25–7.43)
pO2, Ven: 47 mmHg — ABNORMAL HIGH (ref 32–45)

## 2023-01-23 LAB — TROPONIN I (HIGH SENSITIVITY)
Troponin I (High Sensitivity): 152 ng/L (ref <18)
Troponin I (High Sensitivity): 139 ng/L
Troponin I (High Sensitivity): 115 ng/L

## 2023-01-23 LAB — BETA-HYDROXYBUTYRIC ACID: Beta-Hydroxybutyric Acid: 6.86 mmol/L — ABNORMAL HIGH (ref 0.05–0.27)

## 2023-01-23 LAB — LIPASE, BLOOD: Lipase: 338 U/L — ABNORMAL HIGH (ref 11–51)

## 2023-01-23 LAB — ETHANOL: Alcohol, Ethyl (B): <10 mg/dL (ref <10)

## 2023-01-23 LAB — CBG MONITORING, ED: Glucose-Capillary: 268 mg/dL — ABNORMAL HIGH (ref 70–99)

## 2023-01-23 LAB — BRAIN NATRIURETIC PEPTIDE: B Natriuretic Peptide: 112.5 pg/mL — ABNORMAL HIGH (ref 0.0–100.0)

## 2023-01-23 LAB — TSH: TSH: 0.028 u[IU]/mL — ABNORMAL LOW (ref 0.350–4.500)

## 2023-01-23 MED ORDER — ACETAMINOPHEN 325 MG PO TABS
650.0000 mg | ORAL_TABLET | Freq: Four times a day (QID) | ORAL | Status: DC | PRN
  Administered 2023-01-31 – 2023-02-01 (×2): 650 mg via ORAL
  Filled 2023-01-23 (×2): qty 2

## 2023-01-23 MED ORDER — ACETAMINOPHEN 650 MG RE SUPP: 650.0000 mg | Freq: Four times a day (QID) | RECTAL | Status: DC | PRN

## 2023-01-23 MED ORDER — INSULIN ASPART 100 UNIT/ML IJ SOLN: 2.0000 [IU] | Freq: Three times a day (TID) | INTRAMUSCULAR | Status: DC

## 2023-01-23 MED ORDER — ASPIRIN 81 MG PO TBEC
81.0000 mg | DELAYED_RELEASE_TABLET | Freq: Every day | ORAL | Status: DC
  Administered 2023-01-24 – 2023-01-30 (×6): 81 mg via ORAL
  Filled 2023-01-23 (×6): qty 1

## 2023-01-23 MED ORDER — SODIUM CHLORIDE 0.9 % IV SOLN: INTRAVENOUS | Status: DC

## 2023-01-23 MED ORDER — ONDANSETRON HCL 4 MG PO TABS: 4.0000 mg | ORAL_TABLET | Freq: Four times a day (QID) | ORAL | Status: DC | PRN

## 2023-01-23 MED ORDER — HEPARIN SODIUM (PORCINE) 5000 UNIT/ML IJ SOLN
5000.0000 [IU] | Freq: Three times a day (TID) | INTRAMUSCULAR | Status: DC
  Administered 2023-01-23 – 2023-01-25 (×5): 5000 [IU] via SUBCUTANEOUS
  Filled 2023-01-23 (×5): qty 1

## 2023-01-23 MED ORDER — SODIUM CHLORIDE 0.9 % IV BOLUS (SEPSIS)
1000.0000 mL | Freq: Once | INTRAVENOUS | Status: AC
  Administered 2023-01-23: 1000 mL via INTRAVENOUS

## 2023-01-23 MED ORDER — SODIUM CHLORIDE 0.9 % IV SOLN
1.0000 g | INTRAVENOUS | Status: AC
  Administered 2023-01-24 – 2023-01-27 (×4): 1 g via INTRAVENOUS
  Filled 2023-01-23 (×4): qty 10

## 2023-01-23 MED ORDER — SODIUM CHLORIDE 0.9 % IV BOLUS
500.0000 mL | Freq: Once | INTRAVENOUS | Status: AC
  Administered 2023-01-23: 500 mL via INTRAVENOUS

## 2023-01-23 MED ORDER — SODIUM CHLORIDE 0.9 % IV SOLN
2.0000 g | INTRAVENOUS | Status: DC
  Administered 2023-01-23: 2 g via INTRAVENOUS
  Filled 2023-01-23: qty 20

## 2023-01-23 MED ORDER — INSULIN ASPART 100 UNIT/ML IJ SOLN
0.0000 [IU] | Freq: Three times a day (TID) | INTRAMUSCULAR | Status: DC
  Administered 2023-01-24 (×2): 1 [IU] via SUBCUTANEOUS

## 2023-01-23 MED ORDER — ONDANSETRON HCL 4 MG/2ML IJ SOLN: 4.0000 mg | Freq: Four times a day (QID) | INTRAMUSCULAR | Status: DC | PRN

## 2023-01-23 MED ORDER — ALBUTEROL SULFATE (2.5 MG/3ML) 0.083% IN NEBU: 2.5000 mg | INHALATION_SOLUTION | RESPIRATORY_TRACT | Status: DC | PRN

## 2023-01-23 NOTE — H&P (Signed)
History and Physical    Samantha [name] WUJ:[id number] DOB: 1946/02/26 DOA: 01/23/2023  PCP: Hillery Aldo, NP  Patient coming from: home  I have personally briefly reviewed patient's old medical records in [facility]  Chief Complaint: change in mental status concern for uti  HPI: Samantha [name] is a 77 y.o. 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 Patient unable to give meaningful history.  Daughter at bedside aides in history. Per daughter patient has been unwell x 1 mo. With complaint of flank pain on right and increasing fatigue and confusion. However over the last 1-2 days symptoms have been more severe. Due to progression of symptoms patient was brought in for evaluation.   ED Course:  Vitals: afeb, bp 90/60-118/74, hr 140 , rr 16  sat 99% Wbc 11.7 (8.7), hgb 13.2, plt397 Etoh<10 Na :155, K 3.6, bicarb 13 (23) cr  1.98 (1.27-0.98) CE152 TSH 0.028 Glu:268  NFA:OZHYQMV  Resp panel:neg covid EKGL NSR,prolonged QT Tx NS!L, CTX 2gram  Review of Systems: As per HPI otherwise 10 point review of systems negative.   Past Medical History:  Diagnosis Date   Allergic rhinitis    Arthritis of both knees    Arthritis of both shoulder regions    Bilateral carpal tunnel syndrome    Cataract    Chronic low back pain    CKD (chronic kidney disease), stage III (HCC)    Decreased vision    Diabetes mellitus    HTN (hypertension)    Hyperlipidemia    Morbid obesity (HCC)    Thyroid disease     Past Surgical History:  Procedure Laterality Date   CATARACT EXTRACTION     COLONOSCOPY     COLONOSCOPY N/A 09/15/2020   Procedure: COLONOSCOPY;  Surgeon: Corbin Ade,  MD;  [facility];  Service: Endoscopy;  Laterality: N/A;  ASA II / PM procedure   POLYPECTOMY  09/15/2020   Procedure: POLYPECTOMY;  Surgeon: Corbin Ade, MD;  [facility];  Service: Endoscopy;;   TUBAL LIGATION       reports that she has never smoked. She has never used smokeless tobacco. She reports current alcohol use. She reports that she does not use drugs.  No Known Allergies  Family History  Problem Relation Age of Onset   Hypertension Father    Hypertension Mother    Diabetes Mother    Breast cancer Mother    Hypertension Sister    Cancer Sister        pancreatic   Cancer Sister        pancreatic   Liver disease Sister     Prior to Admission medications   Medication Sig Start Date End Date Taking? Authorizing Provider  acetaminophen (TYLENOL) 500 MG tablet Take 500-1,000 mg by mouth every 6 (six) hours as needed for mild pain.    [provider]  aspirin EC 81 MG tablet Take 81 mg by mouth daily. Patient not taking: Reported on 01/19/2023    [provider]  cyclobenzaprine (FLEXERIL) 5 MG tablet  12/04/22   [provider]  gabapentin (NEURONTIN) 300 MG capsule Take 300 mg by mouth 3 (three) times daily. Patient not taking: Reported on  01/19/2023 12/16/22 12/16/23  [provider]  JARDIANCE 25 MG TABS tablet Take 25 mg by mouth daily. 07/20/22   [provider]  lisinopril (ZESTRIL) 20 MG tablet Take 20 mg by mouth every morning. 07/20/22   [provider]  meclizine (ANTIVERT) 25 MG tablet Take 25 mg by mouth 3 (three) times daily as needed for dizziness. Patient not taking: Reported on 01/19/2023    [provider]  metFORMIN (GLUCOPHAGE-XR) 500 MG 24 hr tablet Take 500 mg by mouth daily. 07/18/22   [provider]  methocarbamol (ROBAXIN) 500 MG tablet Take 1 tablet (500 mg total) by mouth 3 (three) times daily. May cause drowsiness.  Do not operate machinery or drive while taking this  medication Patient not taking: Reported on 01/19/2023 01/04/23   Triplett, Tammy, PA-C  metoprolol tartrate (LOPRESSOR) 25 MG tablet Take 1 tablet (25 mg total) by mouth 2 (two) times daily. 01/04/23   Triplett, Tammy, PA-C  ondansetron (ZOFRAN) 4 MG tablet Take 1 tablet (4 mg total) by mouth every 8 (eight) hours as needed for nausea or vomiting. 01/19/23   Dyanne Iha, MD  OVER THE COUNTER MEDICATION Take 1 tablet by mouth at bedtime as needed (leg cramps). legatrin pm    [provider]  oxyCODONE-acetaminophen (PERCOCET/ROXICET) 5-325 MG tablet Take 1 tablet by mouth every 6 (six) hours as needed for severe pain. Patient not taking: Reported on 01/19/2023 01/03/23   Smitty Knudsen, PA-C    Physical Exam: Vitals:   01/23/23 1330 01/23/23 1345 01/23/23 1504 01/23/23 1505  BP: 119/71 92/77 128/74 128/74  Pulse: (!) 132  (!) 131 (!) 131  Resp: (!) 28 (!) 27 18 (!) 25  Temp:  98.2 F (36.8 C)    TempSrc:  Oral    SpO2: 100%  100% 100%    Constitutional: NAD, calm,restless, appears unwell Vitals:   01/23/23 1330 01/23/23 1345 01/23/23 1504 01/23/23 1505  BP: 119/71 92/77 128/74 128/74  Pulse: (!) 132  (!) 131 (!) 131  Resp: (!) 28 (!) 27 18 (!) 25  Temp:  98.2 F (36.8 C)    TempSrc:  Oral    SpO2: 100%  100% 100%   Eyes: PERRL, lids and conjunctivae normal ENMT: Mucous membranes are moist. Posterior pharynx clear of any exudate or lesions.Normal dentition.  Neck: normal, supple, no masses, no thyromegaly Respiratory: clear to auscultation bilaterally, no wheezing, no crackles. Normal respiratory effort. No accessory muscle use.  Cardiovascular: Regular rate and rhythm, no murmurs / rubs / gallops. No extremity edema. 2+ pedal pulses.  Abdomen: no tenderness, no masses palpated. No hepatosplenomegaly. Bowel sounds positive.  Musculoskeletal: no clubbing / cyanosis. No joint deformity upper and lower extremities. Good ROM, no contractures. Normal muscle tone.  Skin: no  rashes, lesions, ulcers. No induration Neurologic: CN 2-12 grossly intact. Sensation intact, Strength 5/5 in all 4.  Psychiatric: . Alert confused   Labs on Admission: I have personally reviewed following labs and imaging studies  CBC: Recent Labs  Lab 01/19/23 1527 01/23/23 1319  WBC 8.7 11.7*  NEUTROABS  --  9.8*  HGB 11.8* 13.2  HCT 39.2 45.4  MCV 84.3 85.0  PLT 472* 397   Basic Metabolic Panel: Recent Labs  Lab 01/19/23 1527 01/23/23 1319  NA 139 155*  K 3.6 3.6  CL 102 118*  CO2 23 13*  GLUCOSE 381* 297*  BUN 26* 31*  CREATININE 1.27* 1.98*  CALCIUM 11.7* 12.4*   GFR: Estimated Creatinine Clearance:  21.2 mL/min (A) (by C-G formula based on SCr of 1.98 mg/dL (H)). Liver Function Tests: Recent Labs  Lab 01/19/23 1527 01/23/23 1319  AST 18 23  ALT 18 20  ALKPHOS 99 88  BILITOT 0.5 1.0  PROT 7.3 7.7  ALBUMIN 2.9* 3.3*   Recent Labs  Lab 01/19/23 1527 01/23/23 1319  LIPASE 106* 338*   No results for input(s): "AMMONIA" in the last 168 hours. Coagulation Profile: No results for input(s): "INR", "PROTIME" in the last 168 hours. Cardiac Enzymes: No results for input(s): "CKTOTAL", "CKMB", "CKMBINDEX", "TROPONINI" in the last 168 hours. BNP (last 3 results) No results for input(s): "PROBNP" in the last 8760 hours. HbA1C: No results for input(s): "HGBA1C" in the last 72 hours. CBG: Recent Labs  Lab 01/23/23 1228  GLUCAP 268*   Lipid Profile: No results for input(s): "CHOL", "HDL", "LDLCALC", "TRIG", "CHOLHDL", "LDLDIRECT" in the last 72 hours. Thyroid Function Tests: Recent Labs    01/23/23 1319  TSH 0.028*   Anemia Panel: No results for input(s): "VITAMINB12", "FOLATE", "FERRITIN", "TIBC", "IRON", "RETICCTPCT" in the last 72 hours. Urine analysis:    Component Value Date/Time   COLORURINE YELLOW 01/19/2023 1640   APPEARANCEUR CLEAR 01/19/2023 1640   LABSPEC 1.022 01/19/2023 1640   PHURINE 5.0 01/19/2023 1640   GLUCOSEU >=500 (A)  01/19/2023 1640   HGBUR NEGATIVE 01/19/2023 1640   HGBUR negative 11/11/2007 1104   BILIRUBINUR NEGATIVE 01/19/2023 1640   KETONESUR NEGATIVE 01/19/2023 1640   PROTEINUR NEGATIVE 01/19/2023 1640   UROBILINOGEN 0.2 11/18/2014 2105   NITRITE NEGATIVE 01/19/2023 1640   LEUKOCYTESUR MODERATE (A) 01/19/2023 1640    Radiological Exams on Admission: No results found.  EKG: Independently reviewed. See above  Assessment/Plan   UTI with Sepsis  -recent hx of Klebsiella uti / does not appear patient completed course  -sensitive to Augmentin/ ctx -tachycardia, lactic acidosis , tachypnea, abn UA -admit to progressive care -s/p goal directed ivfs in ED  - continue with CTX 2gram  - f/u on urine/blood culture   AKI  on CKDIII -insetting of sepsis/ uti  - hold nephrotoxic medications  - renal u/s r/o pyelonephritis  -monitor uop   NSTEMI presumed type II - EKG no hyperacute st-twave changes -initial CE 152, repeat pending  - will continue to trend  -echo in am to be complete   DMII -hyperglycemic , typically controlled A1c 6.8  -iss/fs -hold oral hypoglycemic in setting of renal failure   Hyperthyroidism -s/p ablation 6/24  - tsh 0.28  , expansion pending  - per last endo note , presumed will eventually need supplementation for hypothyroidism due to recent  ablation   Hypertension -hold anti-htn meds in setting of relative hypotension and aki    Hyperlipidemia -continue on atorvastatin   Chronic low back pain  -supportive care   DVT prophylaxis: heparin Code Status: patient  expected to be admitted greater than 2 midnights  Family Communication:   [place], Lumbra (Daughter) [phone] (Mobile)   Disposition Plan: patient  expected to be admitted greater than 2 midnights  Admission status: progressive care    Lurline Del MD [facility]  If 7PM-7AM, please contact night-coverage [email or web address] Password [facility]  01/23/2023, 3:40 PM

## 2023-01-23 NOTE — ED Notes (Signed)
Samantha [name] was placed onto patient, with suction on 90.

## 2023-01-23 NOTE — ED Notes (Signed)
Patient was placed on to bedpan to void, patient did not have any success. We also tried to assist patient to bedside toilet and her heart rate was 147 when we tried to get her up . The Nurse was informed.

## 2023-01-23 NOTE — ED Notes (Signed)
ED TO INPATIENT HANDOFF REPORT  ED Nurse Name and Phone #: cori [phone]  S Name/Age/Gender Miguel Dibble 77 y.o. female Room/Bed: 028C/028C  Code Status   Code Status: Full Code  Home/SNF/Other Home Patient oriented to: self Is this baseline? No   Triage Complete: Triage complete  Chief Complaint Sepsis secondary to UTI (HCC) [A41.9, N39.0]  Triage Note Pt came in via POV d/t AMS that daughter reports started after she has been treated for a UTI for the last 4 days. Has been on ABT since then & her daughter called her PCP & it was advised to come in for eval. Daughter reports she found her mother on the floor unaware if she laid down or fell this morning as well. Pt is alert to self while in triage.    Allergies No Known Allergies  Level of Care/Admitting Diagnosis ED Disposition     ED Disposition  Admit   Condition  --   Comment  Hospital Area: [facility] [[id number]]  Level of Care: Progressive [102]  Admit to Progressive based on following criteria: MULTISYSTEM THREATS such as stable sepsis, metabolic/electrolyte imbalance with or without encephalopathy that is responding to early treatment.  May admit patient to [facility] or [facility] if equivalent level of care is available:: No  Covid Evaluation: Asymptomatic - no recent exposure (last 10 days) testing not required  Diagnosis: Sepsis secondary to UTI [facility]) [[id number]]  Admitting Physician: Lurline Del [[id number]]  Attending Physician: Lurline Del [[id number]]  Certification:: I certify this patient will need inpatient services for at least 2 midnights  Expected Medical Readiness: 01/26/2023          B Medical/Surgery History Past Medical History:  Diagnosis Date   Allergic rhinitis    Arthritis of both knees    Arthritis of both shoulder regions    Bilateral carpal tunnel syndrome    Cataract    Chronic low back pain    CKD (chronic kidney disease), stage III (HCC)    Decreased  vision    Diabetes mellitus    HTN (hypertension)    Hyperlipidemia    Morbid obesity (HCC)    Thyroid disease    Past Surgical History:  Procedure Laterality Date   CATARACT EXTRACTION     COLONOSCOPY     COLONOSCOPY N/A 09/15/2020   Procedure: COLONOSCOPY;  Surgeon: Corbin Ade, MD;  [facility];  Service: Endoscopy;  Laterality: N/A;  ASA II / PM procedure   POLYPECTOMY  09/15/2020   Procedure: POLYPECTOMY;  Surgeon: Corbin Ade, MD;  [facility];  Service: Endoscopy;;   TUBAL LIGATION       A IV Location/Drains/Wounds Patient Lines/Drains/Airways Status     Active Line/Drains/Airways     Name Placement date Placement time Site Days   Peripheral IV 01/23/23 20 G 1.16" Left Antecubital 01/23/23  1335  Antecubital  less than 1            Intake/Output Last 24 hours  Intake/Output Summary (Last 24 hours) at 01/23/2023 1747 Last data filed at 01/23/2023 1606 Gross per 24 hour  Intake 1100 ml  Output --  Net 1100 ml    Labs/Imaging Results for orders placed or performed during the hospital encounter of 01/23/23 (from the past 48 hour(s))  CBG monitoring, ED     Status: Abnormal   Collection Time: 01/23/23 12:28 PM  Result Value Ref Range   Glucose-Capillary 268 (H) 70 -  99 mg/dL    Comment: Glucose reference range applies only to samples taken after fasting for at least 8 hours.  Comprehensive metabolic panel     Status: Abnormal   Collection Time: 01/23/23  1:19 PM  Result Value Ref Range   Sodium 155 (H) 135 - 145 mmol/L   Potassium 3.6 3.5 - 5.1 mmol/L   Chloride 118 (H) 98 - 111 mmol/L   CO2 13 (L) 22 - 32 mmol/L   Glucose, Bld 297 (H) 70 - 99 mg/dL    Comment: Glucose reference range applies only to samples taken after fasting for at least 8 hours.   BUN 31 (H) 8 - 23 mg/dL   Creatinine, Ser 5.78 (H) 0.44 - 1.00 mg/dL   Calcium 46.9 (H) 8.9 - 10.3 mg/dL   Total Protein 7.7 6.5 - 8.1 g/dL   Albumin 3.3 (L) 3.5 - 5.0 g/dL    AST 23 15 - 41 U/L   ALT 20 0 - 44 U/L   Alkaline Phosphatase 88 38 - 126 U/L   Total Bilirubin 1.0 0.3 - 1.2 mg/dL   GFR, Estimated 26 (L) >60 mL/min    Comment: (NOTE) Calculated using the CKD-EPI Creatinine Equation (2021)    Anion gap 24 (H) 5 - 15    Comment: ELECTROLYTES REPEATED TO VERIFY Performed at [facility], [address], [place], [place] [address]   Ethanol     Status: None   Collection Time: 01/23/23  1:19 PM  Result Value Ref Range   Alcohol, Ethyl (B) <10 <10 mg/dL    Comment: (NOTE) Lowest detectable limit for serum alcohol is 10 mg/dL.  For medical purposes only. Performed at [facility], [address], [place], [place] [address]   Lipase, blood     Status: Abnormal   Collection Time: 01/23/23  1:19 PM  Result Value Ref Range   Lipase 338 (H) 11 - 51 U/L    Comment: Performed at [facility], [address], [place], [place] [address]  Troponin I (High Sensitivity)     Status: Abnormal   Collection Time: 01/23/23  1:19 PM  Result Value Ref Range   Troponin I (High Sensitivity) 152 (HH) <18 ng/L    Comment: CRITICAL RESULT CALLED TO, READ BACK BY AND VERIFIED WITH M.ROBERTS RN 1436 01/23/2023 G.GANADEN (NOTE) Elevated high sensitivity troponin I (hsTnI) values and significant  changes across serial measurements may suggest ACS but many other  chronic and acute conditions are known to elevate hsTnI results.  Refer to the "Links" section for chest pain algorithms and additional  guidance. Performed at [facility], [address], [place], [place] [address]   CBC with Differential     Status: Abnormal   Collection Time: 01/23/23  1:19 PM  Result Value Ref Range   WBC 11.7 (H) 4.0 - 10.5 K/uL   RBC 5.34 (H) 3.87 - 5.11 MIL/uL   Hemoglobin 13.2 12.0 - 15.0 g/dL   HCT 66.4 40.3 - 47.4 %   MCV 85.0 80.0 - 100.0 fL   MCH 24.7 (L) 26.0 - 34.0 pg   MCHC 29.1 (L) 30.0 - 36.0 g/dL   RDW 25.9 (H) 56.3 - 87.5 %   Platelets 397 150  - 400 K/uL   nRBC 0.3 (H) 0.0 - 0.2 %   Neutrophils Relative % 83 %   Neutro Abs 9.8 (H) 1.7 - 7.7 K/uL   Lymphocytes Relative 9 %   Lymphs  Abs 1.0 0.7 - 4.0 K/uL   Monocytes Relative 7 %   Monocytes Absolute 0.8 0.1 - 1.0 K/uL   Eosinophils Relative 0 %   Eosinophils Absolute 0.0 0.0 - 0.5 K/uL   Basophils Relative 0 %   Basophils Absolute 0.0 0.0 - 0.1 K/uL   Immature Granulocytes 1 %   Abs Immature Granulocytes 0.09 (H) 0.00 - 0.07 K/uL    Comment: Performed at [facility], [address], [place], [place] [address]  TSH     Status: Abnormal   Collection Time: 01/23/23  1:19 PM  Result Value Ref Range   TSH 0.028 (L) 0.350 - 4.500 uIU/mL    Comment: Performed by a 3rd Generation assay with a functional sensitivity of <=0.01 uIU/mL. Performed at [facility], [address] [place], [place] [address]   SARS Coronavirus 2 by RT PCR (hospital order, performed in [facility] hospital lab) *cepheid single result test* Anterior Nasal Swab     Status: None   Collection Time: 01/23/23  1:19 PM   Specimen: Anterior Nasal Swab  Result Value Ref Range   SARS Coronavirus 2 by RT PCR NEGATIVE NEGATIVE    Comment: Performed at [facility], [address] [place], [place] [address]  I-Stat Lactic Acid     Status: Abnormal   Collection Time: 01/23/23  2:13 PM  Result Value Ref Range   Lactic Acid, Venous 3.9 (HH) 0.5 - 1.9 mmol/L   Comment NOTIFIED PHYSICIAN   Troponin I (High Sensitivity)     Status: Abnormal   Collection Time: 01/23/23  3:05 PM  Result Value Ref Range   Troponin I (High Sensitivity) 139 (HH) <18 ng/L    Comment: CRITICAL VALUE NOTED. VALUE IS CONSISTENT WITH PREVIOUSLY REPORTED/CALLED VALUE (NOTE) Elevated high sensitivity troponin I (hsTnI) values and significant  changes across serial measurements may suggest ACS but many other  chronic and acute conditions are known to elevate hsTnI results.  Refer to the "Links" section for chest pain  algorithms and additional  guidance. Performed at [facility], [address] [place], [place] [address]   I-Stat CG4 Lactic Acid, ED     Status: None   Collection Time: 01/23/23  5:16 PM  Result Value Ref Range   Lactic Acid, Venous 1.9 0.5 - 1.9 mmol/L   CT Head Wo Contrast  Result Date: 01/23/2023 CLINICAL DATA:  Mental status change, unknown cause EXAM: CT HEAD WITHOUT CONTRAST TECHNIQUE: Contiguous axial images were obtained from the base of the skull through the vertex without intravenous contrast. RADIATION DOSE REDUCTION: This exam was performed according to the departmental dose-optimization program which includes automated exposure control, adjustment of the mA and/or kV according to patient size and/or use of iterative reconstruction technique. COMPARISON:  CT Head 11/14/11 FINDINGS: Brain: No evidence of acute infarction, hemorrhage, hydrocephalus, extra-axial collection or mass lesion/mass effect. Vascular: No hyperdense vessel or unexpected calcification. Skull: Normal. Negative for fracture or focal lesion. Sinuses/Orbits: No middle ear or mastoid effusion. Paranasal sinuses are clear. Bilateral lens replacement. Orbits are otherwise unremarkable. Other: None. IMPRESSION: No acute intracranial abnormality. Electronically Signed   By: Lorenza Cambridge M.D.   On: 01/23/2023 16:14    Pending Labs Unresulted Labs (From admission, onward)     Start     Ordered   01/24/23 0500  Comprehensive metabolic panel  Tomorrow morning,   R        01/23/23 1622   01/24/23 0500  CBC  Tomorrow morning,   R        01/23/23 1622   01/23/23 1625  T3  Once,   R        01/23/23 1624   01/23/23 1625  T4, free  Once,   R        01/23/23 1624   01/23/23 1623  Blood gas, venous  Once,   R        01/23/23 1622   01/23/23 1623  Beta-hydroxybutyric acid  Once,   R        01/23/23 1622   01/23/23 1622  Basic metabolic panel  Once,   R        01/23/23 1622   01/23/23 1622  Urinalysis, w/ Reflex to  Culture (Infection Suspected) -Urine, Clean Catch  (Urine Culture)  Once,   R       Question:  Specimen Source  Answer:  Urine, Clean Catch   01/23/23 1622   01/23/23 1622  Brain natriuretic peptide  Once,   R        01/23/23 1622   01/23/23 1621  CBC  (heparin)  Once,   R       Comments: Baseline for heparin therapy IF NOT ALREADY DRAWN.  Notify MD if PLT < 100 K.    01/23/23 1622   01/23/23 1619  Hemoglobin A1c  Once,   R       Comments: To assess prior glycemic control    01/23/23 1619   01/23/23 1256  T3, free  Once,   URGENT        01/23/23 1255   01/23/23 1256  T4  Once,   URGENT        01/23/23 1255   01/23/23 1255  Culture, blood (routine x 2)  BLOOD CULTURE X 2,   R (with STAT occurrences)      01/23/23 1255   01/23/23 1255  Urinalysis, w/ Reflex to Culture (Infection Suspected) -Urine, Clean Catch  Once,   URGENT       Question:  Specimen Source  Answer:  Urine, Clean Catch   01/23/23 1255   01/23/23 1255  Urine Culture  Once,   URGENT       Question:  Indication  Answer:  Altered mental status (if no other cause identified)   01/23/23 1255   01/23/23 1255  Urine rapid drug screen (hosp performed)  Once,   STAT        01/23/23 1255            Vitals/Pain Today's Vitals   01/23/23 1700 01/23/23 1715 01/23/23 1730 01/23/23 1745  BP: 129/74 128/78 118/69 129/72  Pulse: (!) 125  (!) 129 (!) 131  Resp: (!) 21 20 (!) 29 17  Temp:      TempSrc:      SpO2: 100%  100% 99%  PainSc:        Isolation Precautions Airborne and Contact precautions  Medications Medications  cefTRIAXone (ROCEPHIN) 2 g in sodium chloride 0.9 % 100 mL IVPB (0 g Intravenous Stopped 01/23/23 1542)  0.9 %  sodium chloride infusion ( Intravenous New Bag/Given 01/23/23 1545)  insulin aspart (novoLOG) injection 0-9 Units (has no administration in time range)  insulin aspart (novoLOG) injection 2 Units (has no administration in time range)  heparin injection 5,000 Units (has no administration in  time range)  acetaminophen (TYLENOL) tablet 650 mg (has no administration in time range)    Or  acetaminophen (TYLENOL)  suppository 650 mg (has no administration in time range)  ondansetron (ZOFRAN) tablet 4 mg (has no administration in time range)    Or  ondansetron (ZOFRAN) injection 4 mg (has no administration in time range)  aspirin EC tablet 81 mg (has no administration in time range)  albuterol (PROVENTIL) (2.5 MG/3ML) 0.083% nebulizer solution 2.5 mg (has no administration in time range)  sodium chloride 0.9 % bolus 500 mL (500 mLs Intravenous New Bag/Given 01/23/23 1335)  sodium chloride 0.9 % bolus 1,000 mL (0 mLs Intravenous Stopped 01/23/23 1606)    Mobility Walks with device at baseline, non-ambulatory at this time.       Focused Assessments Neuro Assessment Handoff:  Swallow screen pass?  no Cardiac Rhythm: Sinus tachycardia       Neuro Assessment: Exceptions to WDL Neuro Checks:      Has TPA been given? No If patient is a Neuro Trauma and patient is going to OR before floor call report to 4N Charge nurse: [phone] or [phone]   R Recommendations: See Admitting Provider Note  Report given to:   Additional Notes: n/a

## 2023-01-23 NOTE — Therapy (Deleted)
[name] OUTPATIENT PHYSICAL THERAPY THORACOLUMBAR EVALUATION   Patient Name: Samantha [name] MRN: [id number] DOB:1945/09/25, 77 y.o., female Today's Date: 01/23/2023  END OF SESSION: ***  Past Medical History:  Diagnosis Date   Allergic rhinitis    Arthritis of both knees    Arthritis of both shoulder regions    Bilateral carpal tunnel syndrome    Cataract    Chronic low back pain    CKD (chronic kidney disease), stage III (HCC)    Decreased vision    Diabetes mellitus    HTN (hypertension)    Hyperlipidemia    Morbid obesity (HCC)    Thyroid disease    Past Surgical History:  Procedure Laterality Date   CATARACT EXTRACTION     COLONOSCOPY     COLONOSCOPY N/A 09/15/2020   Procedure: COLONOSCOPY;  Surgeon: Corbin Ade, MD;  Location: [facility];  Service: Endoscopy;  Laterality: N/A;  ASA II / PM procedure   POLYPECTOMY  09/15/2020   Procedure: POLYPECTOMY;  Surgeon: Corbin Ade, MD;  Location: [facility];  Service: Endoscopy;;   TUBAL LIGATION     Patient Active Problem List   Diagnosis Date Noted   Vaginal dryness 11/16/2022   Normal breast exam 11/16/2022   Vaginal discharge 11/16/2022   Vulvar itching 11/16/2022   Screening examination for STD (sexually transmitted disease) 11/16/2022   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   Type 2 diabetes mellitus without complication, without long-term current use of insulin (HCC) 10/06/2022   Hyperthyroidism 10/05/2022   Pain in joint of right knee 06/20/2022   KNEE, ARTHRITIS, DEGEN./OSTEO 12/16/2007   DERANGEMENT MENISCUS 12/16/2007   HLD (hyperlipidemia) 12/18/2006   HYPERLIPIDEMIA 05/07/2006   OBESITY NOS 05/07/2006   ANEMIA-NOS 05/07/2006   CARPAL TUNNEL SYNDROME 05/07/2006   Essential hypertension, benign 05/07/2006    PCP: Hillery Aldo, NPRef Provider (PCP)   REFERRING PROVIDER:   Hillery Aldo, NP    REFERRING DIAG:  Diagnosis  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  ONSET DATE: *** --------------------------------------------------------------------------------------------- SUBJECTIVE:                                                                                                                                                                                           SUBJECTIVE STATEMENT: ***  PERTINENT HISTORY:  ***  PAIN:  Are you having pain? {OPRCPAIN:27236}  PRECAUTIONS: {Therapy precautions:24002}  RED FLAGS: {PT Red Flags:29287}   WEIGHT BEARING RESTRICTIONS: {Yes ***/No:24003}  FALLS:  Has patient fallen in last 6 months? {fallsyesno:27318}  LIVING ENVIRONMENT: Lives with: {OPRC lives with:25569::"lives with their family"} Lives in: {Lives in:25570} Stairs: {opstairs:27293} Has following equipment at home: {Assistive devices:23999}  OCCUPATION: ***  PLOF: {PLOF:24004}  PATIENT GOALS: ***  NEXT MD VISIT: *** --------------------------------------------------------------------------------------------- OBJECTIVE:   DIAGNOSTIC FINDINGS:  ***  PATIENT SURVEYS:  {rehab surveys:24030}  SCREENING FOR RED FLAGS: Bowel or bladder incontinence: {Yes/No:304960894} Spinal tumors: {Yes/No:304960894} Cauda equina syndrome: {Yes/No:304960894} Compression fracture: {Yes/No:304960894} Abdominal aneurysm: {Yes/No:304960894}  COGNITION: Overall cognitive status: {cognition:24006}  POSTURE: {posture:25561}      FUNCTIONAL TESTS:  {Functional tests:24029} {Functional tests:24029}  GAIT ANALYSIS: Distance walked: *** Assistive device utilized: {Assistive devices:23999} Level of assistance: {Levels of assistance:24026} Comments: ***  SENSATION: {sensation:27233}   LUMBAR ROM:   AROM eval  Flexion   Extension   Right lateral flexion   Left lateral flexion   Right rotation   Left rotation    (Blank rows = not  tested; * = limited by pain)  LOWER EXTREMITY MMT:    MMT Right eval Left eval  Hip flexion    Hip extension    Hip abduction    Hip adduction    Hip internal rotation    Hip external rotation    Knee flexion    Knee extension    Ankle dorsiflexion    Ankle plantarflexion    Ankle inversion    Ankle eversion     (Blank rows = not tested)  LOWER EXTREMITY ROM:     {AROM/PROM:27142}  Right eval Left eval  Hip flexion    Hip extension    Hip abduction    Hip adduction    Hip internal rotation    Hip external rotation    Knee flexion    Knee extension    Ankle dorsiflexion    Ankle plantarflexion    Ankle inversion    Ankle eversion     (Blank rows = not tested)  LUMBAR SPECIAL TESTS:  {lumbar special test:25242}  PALPATION: *** --------------------------------------------------------------------------------------------- TODAY'S TREATMENT:                                                                                                                              DATE: ***   PATIENT EDUCATION:  Education details: *** Person educated: {Person educated:25204} Education method: {Education Method:25205} Education comprehension: {Education Comprehension:25206}  HOME EXERCISE PROGRAM: *** --------------------------------------------------------------------------------------------- ASSESSMENT:  CLINICAL IMPRESSION: Patient is a *** y.o. *** who was seen today for physical therapy evaluation and treatment for ***. Patient will benefit from PT to address the limitations/impairments listed below to return to prior level of function   OBJECTIVE IMPAIRMENTS: {opptimpairments:25111}.   ACTIVITY LIMITATIONS: {activitylimitations:27494}  PARTICIPATION LIMITATIONS: {participationrestrictions:25113}  PERSONAL FACTORS: {Personal factors:25162} are also affecting patient's functional outcome.   REHAB POTENTIAL: {rehabpotential:25112}  CLINICAL DECISION MAKING:  {clinical decision making:25114}  EVALUATION COMPLEXITY: {Evaluation complexity:25115}  --------------------------------------------------------------------------------------------- GOALS: Goals reviewed with patient? {yes/no:20286}  SHORT TERM GOALS: Target date: ***  Patient will be able to walk ***meters  for the 2 Minute Walk Test with *** assistance to improve ADL completion, negotiate stairs, and walk community distances Baseline: Goal status: INITIAL  2.  Patient will score a *** on the {Functional tests:24029} to demonstrate an improvement in ADL completion, stair negotiation, household/community ambulation, and self-care Baseline:  Goal status: INITIAL  3. Patient will be independent with a basic stretching/strengthening HEP  Baseline:  Goal status: INITIAL   LONG TERM GOALS: Target date: ***  Patient will be able to walk ***meters for the 2 Minute Walk Test with *** assistance to improve ADL completion, negotiate stairs, and walk community distances Baseline:  Goal status: INITIAL  2.  Patient will score a *** on the {Functional tests:24029} to demonstrate an improvement in ADL completion, stair negotiation, household/community ambulation, and self-care Baseline:  Goal status: INITIAL  3.  Patient will be independent with a comprehensive strengthening HEP  Baseline:  Goal status: INITIAL  --------------------------------------------------------------------------------------------- PLAN:  PT FREQUENCY: {rehab frequency:25116}  PT DURATION: {rehab duration:25117}  PLANNED INTERVENTIONS: {rehab planned interventions:25118::"Therapeutic exercises","Therapeutic activity","Neuromuscular re-education","Balance training","Gait training","Patient/Family education","Self Care","Joint mobilization"}.  PLAN FOR NEXT SESSION: Seymour Bars, PT 01/23/2023, 8:53 AM

## 2023-01-23 NOTE — Plan of Care (Signed)
Problem: Education: Goal: Ability to describe self-care measures that may prevent or decrease complications (Diabetes Survival Skills Education) will improve Outcome: Progressing Goal: Individualized Educational Video(s) Outcome: Progressing   Problem: Coping: Goal: Ability to adjust to condition or change in health will improve Outcome: Progressing   Problem: Fluid Volume: Goal: Ability to maintain a balanced intake and output will improve Outcome: Progressing   Problem: Health Behavior/Discharge Planning: Goal: Ability to identify and utilize available resources and services will improve Outcome: Progressing Goal: Ability to manage health-related needs will improve Outcome: Progressing   Problem: Metabolic: Goal: Ability to maintain appropriate glucose levels will improve Outcome: Progressing   Problem: Nutritional: Goal: Maintenance of adequate nutrition will improve Outcome: Progressing Goal: Progress toward achieving an optimal weight will improve Outcome: Progressing   Problem: Skin Integrity: Goal: Risk for impaired skin integrity will decrease Outcome: Progressing   Problem: Tissue Perfusion: Goal: Adequacy of tissue perfusion will improve Outcome: Progressing   Problem: Education: Goal: Knowledge of General Education information will improve Description: Including pain rating scale, medication(s)/side effects and non-pharmacologic comfort measures Outcome: Progressing   Problem: Health Behavior/Discharge Planning: Goal: Ability to manage health-related needs will improve Outcome: Progressing   Problem: Clinical Measurements: Goal: Ability to maintain clinical measurements within normal limits will improve Outcome: Progressing Goal: Will remain free from infection Outcome: Progressing Goal: Diagnostic test results will improve Outcome: Progressing Goal: Respiratory complications will improve Outcome: Progressing Goal: Cardiovascular complication will  be avoided Outcome: Progressing   Problem: Activity: Goal: Risk for activity intolerance will decrease Outcome: Progressing   Problem: Nutrition: Goal: Adequate nutrition will be maintained Outcome: Progressing   Problem: Coping: Goal: Level of anxiety will decrease Outcome: Progressing   Problem: Elimination: Goal: Will not experience complications related to bowel motility Outcome: Progressing Goal: Will not experience complications related to urinary retention Outcome: Progressing   Problem: Pain Managment: Goal: General experience of comfort will improve Outcome: Progressing   Problem: Safety: Goal: Ability to remain free from injury will improve Outcome: Progressing   Problem: Skin Integrity: Goal: Risk for impaired skin integrity will decrease Outcome: Progressing   Problem: Education: Goal: Ability to describe self-care measures that may prevent or decrease complications (Diabetes Survival Skills Education) will improve Outcome: Progressing   Problem: Education: Goal: Individualized Educational Video(s) Outcome: Progressing   Problem: Coping: Goal: Ability to adjust to condition or change in health will improve Outcome: Progressing   Problem: Fluid Volume: Goal: Ability to maintain a balanced intake and output will improve Outcome: Progressing   Problem: Health Behavior/Discharge Planning: Goal: Ability to identify and utilize available resources and services will improve Outcome: Progressing   Problem: Health Behavior/Discharge Planning: Goal: Ability to manage health-related needs will improve Outcome: Progressing   Problem: Metabolic: Goal: Ability to maintain appropriate glucose levels will improve Outcome: Progressing   Problem: Nutritional: Goal: Maintenance of adequate nutrition will improve Outcome: Progressing   Problem: Nutritional: Goal: Progress toward achieving an optimal weight will improve Outcome: Progressing   Problem: Skin  Integrity: Goal: Risk for impaired skin integrity will decrease Outcome: Progressing   Problem: Tissue Perfusion: Goal: Adequacy of tissue perfusion will improve Outcome: Progressing   Problem: Education: Goal: Knowledge of General Education information will improve Description: Including pain rating scale, medication(s)/side effects and non-pharmacologic comfort measures Outcome: Progressing   Problem: Health Behavior/Discharge Planning: Goal: Ability to manage health-related needs will improve Outcome: Progressing   Problem: Clinical Measurements: Goal: Ability to maintain clinical measurements within normal limits will improve Outcome: Progressing  Problem: Clinical Measurements: Goal: Will remain free from infection Outcome: Progressing   Problem: Clinical Measurements: Goal: Respiratory complications will improve Outcome: Progressing   Problem: Clinical Measurements: Goal: Respiratory complications will improve Outcome: Progressing   Problem: Clinical Measurements: Goal: Cardiovascular complication will be avoided Outcome: Progressing   Problem: Activity: Goal: Risk for activity intolerance will decrease Outcome: Progressing   Problem: Nutrition: Goal: Adequate nutrition will be maintained Outcome: Progressing   Problem: Nutrition: Goal: Adequate nutrition will be maintained Outcome: Progressing   Problem: Coping: Goal: Level of anxiety will decrease Outcome: Progressing   Problem: Elimination: Goal: Will not experience complications related to bowel motility Outcome: Progressing   Problem: Safety: Goal: Ability to remain free from injury will improve Outcome: Progressing   Problem: Pain Managment: Goal: General experience of comfort will improve Outcome: Progressing   Problem: Skin Integrity: Goal: Risk for impaired skin integrity will decrease Outcome: Progressing

## 2023-01-23 NOTE — ED Triage Notes (Signed)
Pt came in via POV d/t AMS that daughter reports started after she has been treated for a UTI for the last 4 days. Has been on ABT since then & her daughter called her PCP & it was advised to come in for eval. Daughter reports she found her mother on the floor unaware if she laid down or fell this morning as well. Pt is alert to self while in triage.

## 2023-01-23 NOTE — ED Provider Notes (Signed)
Emergency Department Provider Note   I have reviewed the triage vital signs and the nursing notes.   HISTORY  Chief Complaint Altered Mental Status   HPI Samantha [name] is a 77 y.o. 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  Patient lives at home with her grandson.  Patient's daughter is at bedside provides most of the history.  They noticed some increased confusion yesterday after recent ED visit on 8/9.  She was found to have a urinary tract infection on 7/25 which ultimately grew Klebsiella.  It appears that Augmentin was called in to replace Bactrim in the setting.  The patient's daughter states that she believes she is been taking this medication because she has been trying to give her her pills daily including her metoprolol this morning but is unsure about past UTI events and med compliance.  This morning, the patient was found on the bathroom floor although family have lower suspicion that she fell.  Did not hear or witness the fall in the home.  Level 5 caveat: AMS   Past Medical History:  Diagnosis Date   Allergic rhinitis    Arthritis of both knees    Arthritis of both shoulder regions    Bilateral carpal tunnel syndrome    Cataract    Chronic low back pain    CKD (chronic kidney disease), stage III (HCC)    Decreased vision    Diabetes mellitus    HTN (hypertension)    Hyperlipidemia    Morbid obesity (HCC)    Thyroid disease     Review of Systems  Level 5 caveat: AMS  ____________________________________________   PHYSICAL EXAM:  VITAL SIGNS: ED Triage Vitals  Encounter Vitals Group     BP 01/23/23 1225 90/60     Pulse Rate 01/23/23 1225 (!) 140     Resp 01/23/23 1225 16     Temp 01/23/23 1225 98.9 F (37.2 C)     Temp Source 01/23/23 1225 Oral     SpO2 01/23/23 1225  100 %   Constitutional: Alert and oriented to self only. Patient awake and picking and clothes and medical bracelets.  Eyes: Conjunctivae are normal.  Head: Atraumatic. Nose: No congestion/rhinnorhea. Mouth/Throat: Mucous membranes are moist.  Neck: No stridor Cardiovascular: Sinus tachycardia. Good peripheral circulation. Grossly normal heart sounds.   Respiratory: Normal respiratory effort.  No retractions. Lungs CTAB. Gastrointestinal: Soft and nontender. No distention.  Musculoskeletal: No lower extremity tenderness nor edema. No gross deformities of extremities. Neurologic:  Normal speech and language. No gross focal neurologic deficits are appreciated.  Skin:  Skin is warm, dry and intact. No rash noted. ____________________________________________   LABS (all labs ordered are listed, but only abnormal results are displayed)  Labs Reviewed  COMPREHENSIVE METABOLIC PANEL - Abnormal; Notable for the following components:      Result Value   Sodium 155 (*)    Chloride 118 (*)    CO2 13 (*)    Glucose, Bld 297 (*)    BUN 31 (*)    Creatinine, Ser 1.98 (*)    Calcium 12.4 (*)    Albumin 3.3 (*)    GFR, Estimated 26 (*)    Anion gap 24 (*)    All other components within normal limits  LIPASE, BLOOD - Abnormal; Notable for the following components:   Lipase 338 (*)  All other components within normal limits  CBC WITH DIFFERENTIAL/PLATELET - Abnormal; Notable for the following components:   WBC 11.7 (*)    RBC 5.34 (*)    MCH 24.7 (*)    MCHC 29.1 (*)    RDW 16.5 (*)    nRBC 0.3 (*)    Neutro Abs 9.8 (*)    Abs Immature Granulocytes 0.09 (*)    All other components within normal limits  URINALYSIS, W/ REFLEX TO CULTURE (INFECTION SUSPECTED) - Abnormal; Notable for the following components:   APPearance HAZY (*)    Glucose, UA >=500 (*)    Hgb urine dipstick SMALL (*)    Ketones, ur 20 (*)    Bacteria, UA RARE (*)    All other components within normal limits  TSH -  Abnormal; Notable for the following components:   TSH 0.028 (*)    All other components within normal limits  T3, FREE - Abnormal; Notable for the following components:   T3, Free 7.7 (*)    All other components within normal limits  T4 - Abnormal; Notable for the following components:   T4, Total 17.5 (*)    All other components within normal limits  HEMOGLOBIN A1C - Abnormal; Notable for the following components:   Hgb A1c MFr Bld 9.8 (*)    All other components within normal limits  CBC - Abnormal; Notable for the following components:   WBC 12.9 (*)    MCH 24.5 (*)    MCHC 28.8 (*)    RDW 16.5 (*)    All other components within normal limits  BASIC METABOLIC PANEL - Abnormal; Notable for the following components:   Sodium 161 (*)    Chloride 123 (*)    CO2 13 (*)    Glucose, Bld 216 (*)    BUN 34 (*)    Creatinine, Ser 1.84 (*)    Calcium 11.5 (*)    GFR, Estimated 28 (*)    Anion gap 25 (*)    All other components within normal limits  BRAIN NATRIURETIC PEPTIDE - Abnormal; Notable for the following components:   B Natriuretic Peptide 112.5 (*)    All other components within normal limits  BLOOD GAS, VENOUS - Abnormal; Notable for the following components:   pCO2, Ven 25 (*)    pO2, Ven 47 (*)    Bicarbonate 13.1 (*)    Acid-base deficit 11.5 (*)    All other components within normal limits  BETA-HYDROXYBUTYRIC ACID - Abnormal; Notable for the following components:   Beta-Hydroxybutyric Acid 6.86 (*)    All other components within normal limits  T3 - Abnormal; Notable for the following components:   T3, Total 196 (*)    All other components within normal limits  T4, FREE - Abnormal; Notable for the following components:   Free T4 4.47 (*)    All other components within normal limits  COMPREHENSIVE METABOLIC PANEL - Abnormal; Notable for the following components:   Sodium 160 (*)    Chloride 128 (*)    CO2 13 (*)    Glucose, Bld 174 (*)    BUN 31 (*)     Creatinine, Ser 1.69 (*)    Calcium 11.0 (*)    Total Protein 6.3 (*)    Albumin 2.6 (*)    GFR, Estimated 31 (*)    Anion gap 19 (*)    All other components within normal limits  CBC - Abnormal; Notable for the following components:  WBC 12.9 (*)    Hemoglobin 11.2 (*)    MCH 24.8 (*)    MCHC 29.1 (*)    RDW 16.5 (*)    All other components within normal limits  GLUCOSE, CAPILLARY - Abnormal; Notable for the following components:   Glucose-Capillary 180 (*)    All other components within normal limits  GLUCOSE, CAPILLARY - Abnormal; Notable for the following components:   Glucose-Capillary 151 (*)    All other components within normal limits  GLUCOSE, CAPILLARY - Abnormal; Notable for the following components:   Glucose-Capillary 139 (*)    All other components within normal limits  GLUCOSE, CAPILLARY - Abnormal; Notable for the following components:   Glucose-Capillary 122 (*)    All other components within normal limits  BASIC METABOLIC PANEL - Abnormal; Notable for the following components:   Sodium 163 (*)    Potassium 3.3 (*)    Chloride >130 (*)    CO2 10 (*)    Glucose, Bld 137 (*)    BUN 25 (*)    Creatinine, Ser 1.48 (*)    Calcium 11.1 (*)    GFR, Estimated 36 (*)    All other components within normal limits  BASIC METABOLIC PANEL - Abnormal; Notable for the following components:   Sodium 163 (*)    Chloride >130 (*)    CO2 16 (*)    Glucose, Bld 172 (*)    Creatinine, Ser 1.31 (*)    Calcium 10.5 (*)    GFR, Estimated 42 (*)    All other components within normal limits  BASIC METABOLIC PANEL - Abnormal; Notable for the following components:   Sodium 164 (*)    Potassium 3.3 (*)    Chloride >130 (*)    CO2 15 (*)    Glucose, Bld 176 (*)    Creatinine, Ser 1.28 (*)    Calcium 11.2 (*)    GFR, Estimated 43 (*)    All other components within normal limits  BASIC METABOLIC PANEL - Abnormal; Notable for the following components:   Sodium 161 (*)     Potassium 3.1 (*)    Chloride >130 (*)    CO2 19 (*)    Glucose, Bld 190 (*)    Creatinine, Ser 1.11 (*)    Calcium 10.9 (*)    GFR, Estimated 52 (*)    All other components within normal limits  BETA-HYDROXYBUTYRIC ACID - Abnormal; Notable for the following components:   Beta-Hydroxybutyric Acid 7.32 (*)    All other components within normal limits  BETA-HYDROXYBUTYRIC ACID - Abnormal; Notable for the following components:   Beta-Hydroxybutyric Acid 3.73 (*)    All other components within normal limits  GLUCOSE, CAPILLARY - Abnormal; Notable for the following components:   Glucose-Capillary 127 (*)    All other components within normal limits  GLUCOSE, CAPILLARY - Abnormal; Notable for the following components:   Glucose-Capillary 136 (*)    All other components within normal limits  GLUCOSE, CAPILLARY - Abnormal; Notable for the following components:   Glucose-Capillary 155 (*)    All other components within normal limits  CBC - Abnormal; Notable for the following components:   Hemoglobin 11.2 (*)    MCH 24.7 (*)    MCHC 29.4 (*)    RDW 16.6 (*)    All other components within normal limits  GLUCOSE, CAPILLARY - Abnormal; Notable for the following components:   Glucose-Capillary 147 (*)    All other components within normal limits  BETA-HYDROXYBUTYRIC ACID - Abnormal; Notable for the following components:   Beta-Hydroxybutyric Acid 0.43 (*)    All other components within normal limits  GLUCOSE, CAPILLARY - Abnormal; Notable for the following components:   Glucose-Capillary 140 (*)    All other components within normal limits  GLUCOSE, CAPILLARY - Abnormal; Notable for the following components:   Glucose-Capillary 116 (*)    All other components within normal limits  GLUCOSE, CAPILLARY - Abnormal; Notable for the following components:   Glucose-Capillary 156 (*)    All other components within normal limits  GLUCOSE, CAPILLARY - Abnormal; Notable for the following  components:   Glucose-Capillary 131 (*)    All other components within normal limits  BLOOD GAS, VENOUS - Abnormal; Notable for the following components:   pCO2, Ven 42 (*)    pO2, Ven <31 (*)    All other components within normal limits  GLUCOSE, CAPILLARY - Abnormal; Notable for the following components:   Glucose-Capillary 146 (*)    All other components within normal limits  GLUCOSE, CAPILLARY - Abnormal; Notable for the following components:   Glucose-Capillary 164 (*)    All other components within normal limits  GLUCOSE, CAPILLARY - Abnormal; Notable for the following components:   Glucose-Capillary 176 (*)    All other components within normal limits  GLUCOSE, CAPILLARY - Abnormal; Notable for the following components:   Glucose-Capillary 175 (*)    All other components within normal limits  GLUCOSE, CAPILLARY - Abnormal; Notable for the following components:   Glucose-Capillary 167 (*)    All other components within normal limits  GLUCOSE, CAPILLARY - Abnormal; Notable for the following components:   Glucose-Capillary 144 (*)    All other components within normal limits  GLUCOSE, CAPILLARY - Abnormal; Notable for the following components:   Glucose-Capillary 173 (*)    All other components within normal limits  GLUCOSE, CAPILLARY - Abnormal; Notable for the following components:   Glucose-Capillary 162 (*)    All other components within normal limits  GLUCOSE, CAPILLARY - Abnormal; Notable for the following components:   Glucose-Capillary 167 (*)    All other components within normal limits  GLUCOSE, CAPILLARY - Abnormal; Notable for the following components:   Glucose-Capillary 155 (*)    All other components within normal limits  BASIC METABOLIC PANEL - Abnormal; Notable for the following components:   Sodium 165 (*)    Potassium 3.3 (*)    Chloride >130 (*)    CO2 21 (*)    Glucose, Bld 166 (*)    Creatinine, Ser 1.23 (*)    Calcium 11.1 (*)    GFR, Estimated  46 (*)    All other components within normal limits  GLUCOSE, CAPILLARY - Abnormal; Notable for the following components:   Glucose-Capillary 157 (*)    All other components within normal limits  GLUCOSE, CAPILLARY - Abnormal; Notable for the following components:   Glucose-Capillary 158 (*)    All other components within normal limits  BASIC METABOLIC PANEL - Abnormal; Notable for the following components:   Sodium 159 (*)    Potassium 3.4 (*)    Chloride >130 (*)    CO2 17 (*)    Glucose, Bld 242 (*)    Creatinine, Ser 1.02 (*)    Calcium 10.4 (*)    GFR, Estimated 57 (*)    All other components within normal limits  MAGNESIUM - Abnormal; Notable for the following components:   Magnesium 1.6 (*)  All other components within normal limits  GLUCOSE, CAPILLARY - Abnormal; Notable for the following components:   Glucose-Capillary 166 (*)    All other components within normal limits  GLUCOSE, CAPILLARY - Abnormal; Notable for the following components:   Glucose-Capillary 168 (*)    All other components within normal limits  GLUCOSE, CAPILLARY - Abnormal; Notable for the following components:   Glucose-Capillary 194 (*)    All other components within normal limits  GLUCOSE, CAPILLARY - Abnormal; Notable for the following components:   Glucose-Capillary 206 (*)    All other components within normal limits  GLUCOSE, CAPILLARY - Abnormal; Notable for the following components:   Glucose-Capillary 198 (*)    All other components within normal limits  CBC - Abnormal; Notable for the following components:   Hemoglobin 9.8 (*)    HCT 32.5 (*)    MCH 24.7 (*)    RDW 16.6 (*)    All other components within normal limits  GLUCOSE, CAPILLARY - Abnormal; Notable for the following components:   Glucose-Capillary 157 (*)    All other components within normal limits  GLUCOSE, CAPILLARY - Abnormal; Notable for the following components:   Glucose-Capillary 176 (*)    All other components  within normal limits  BASIC METABOLIC PANEL - Abnormal; Notable for the following components:   Sodium 156 (*)    Chloride 127 (*)    CO2 19 (*)    Glucose, Bld 200 (*)    Calcium 10.4 (*)    All other components within normal limits  MAGNESIUM - Abnormal; Notable for the following components:   Magnesium 1.6 (*)    All other components within normal limits  GLUCOSE, CAPILLARY - Abnormal; Notable for the following components:   Glucose-Capillary 169 (*)    All other components within normal limits  GLUCOSE, CAPILLARY - Abnormal; Notable for the following components:   Glucose-Capillary 190 (*)    All other components within normal limits  PHOSPHORUS - Abnormal; Notable for the following components:   Phosphorus 1.5 (*)    All other components within normal limits  GLUCOSE, CAPILLARY - Abnormal; Notable for the following components:   Glucose-Capillary 176 (*)    All other components within normal limits  GLUCOSE, CAPILLARY - Abnormal; Notable for the following components:   Glucose-Capillary 147 (*)    All other components within normal limits  GLUCOSE, CAPILLARY - Abnormal; Notable for the following components:   Glucose-Capillary 144 (*)    All other components within normal limits  GLUCOSE, CAPILLARY - Abnormal; Notable for the following components:   Glucose-Capillary 128 (*)    All other components within normal limits  GLUCOSE, CAPILLARY - Abnormal; Notable for the following components:   Glucose-Capillary 142 (*)    All other components within normal limits  GLUCOSE, CAPILLARY - Abnormal; Notable for the following components:   Glucose-Capillary 156 (*)    All other components within normal limits  GLUCOSE, CAPILLARY - Abnormal; Notable for the following components:   Glucose-Capillary 151 (*)    All other components within normal limits  BASIC METABOLIC PANEL - Abnormal; Notable for the following components:   Sodium 156 (*)    Chloride 128 (*)    CO2 20 (*)     Glucose, Bld 178 (*)    Creatinine, Ser 1.16 (*)    Calcium 10.6 (*)    GFR, Estimated 49 (*)    All other components within normal limits  CBG MONITORING, ED - Abnormal; Notable  for the following components:   Glucose-Capillary 268 (*)    All other components within normal limits  I-STAT CG4 LACTIC ACID, ED - Abnormal; Notable for the following components:   Lactic Acid, Venous 3.9 (*)    All other components within normal limits  TROPONIN I (HIGH SENSITIVITY) - Abnormal; Notable for the following components:   Troponin I (High Sensitivity) 152 (*)    All other components within normal limits  TROPONIN I (HIGH SENSITIVITY) - Abnormal; Notable for the following components:   Troponin I (High Sensitivity) 139 (*)    All other components within normal limits  TROPONIN I (HIGH SENSITIVITY) - Abnormal; Notable for the following components:   Troponin I (High Sensitivity) 115 (*)    All other components within normal limits  CULTURE, BLOOD (ROUTINE X 2)  CULTURE, BLOOD (ROUTINE X 2)  URINE CULTURE  SARS CORONAVIRUS 2 BY RT PCR  ETHANOL  RAPID URINE DRUG SCREEN, HOSP PERFORMED  HEPARIN LEVEL (UNFRACTIONATED)  HEPARIN LEVEL (UNFRACTIONATED)  MAGNESIUM  BASIC METABOLIC PANEL  PHOSPHORUS  I-STAT CG4 LACTIC ACID, ED   ____________________________________________  EKG   EKG Interpretation Date/Time:  Tuesday January 23 2023 13:37:54 EDT Ventricular Rate:  132 PR Interval:  104 QRS Duration:  64 QT Interval:  376 QTC Calculation: 558 R Axis:   40  Text Interpretation: Sinus tachycardia Abnormal R-wave progression, early transition Nonspecific T abnormalities, lateral leads Prolonged QT interval Confirmed by Alona Bene [phone]) on 01/23/2023 3:09:09 PM       ____________________________________________   PROCEDURES  Procedure(s) performed:   Procedures  None  ____________________________________________   INITIAL IMPRESSION / ASSESSMENT AND PLAN / ED  COURSE  Pertinent labs & imaging results that were available during my care of the patient were reviewed by me and considered in my medical decision making (see chart for details).   This patient is Presenting for Evaluation of AMS, which does require a range of treatment options, and is a complaint that involves a high risk of morbidity and mortality.  The Differential Diagnoses includes but is not exclusive to alcohol, illicit or prescription medications, intracranial pathology such as stroke, intracerebral hemorrhage, fever or infectious causes including sepsis, hypoxemia, uremia, trauma, endocrine related disorders such as diabetes, hypoglycemia, thyroid-related diseases, etc.   Critical Interventions-    Medications  acetaminophen (TYLENOL) tablet 650 mg (has no administration in time range)    Or  acetaminophen (TYLENOL) suppository 650 mg (has no administration in time range)  ondansetron (ZOFRAN) tablet 4 mg (has no administration in time range)    Or  ondansetron (ZOFRAN) injection 4 mg (has no administration in time range)  aspirin EC tablet 81 mg ( Oral Canceled Entry 01/25/23 1000)  albuterol (PROVENTIL) (2.5 MG/3ML) 0.083% nebulizer solution 2.5 mg (has no administration in time range)  cefTRIAXone (ROCEPHIN) 1 g in sodium chloride 0.9 % 100 mL IVPB (1 g Intravenous New Bag/Given 01/25/23 1627)  melatonin tablet 3 mg (has no administration in time range)  multivitamin with minerals tablet 1 tablet (1 tablet Oral Not Given 01/25/23 0915)  feeding supplement (ENSURE ENLIVE / ENSURE PLUS) liquid 237 mL (237 mLs Oral Not Given 01/25/23 1332)  insulin regular, human (MYXREDLIN) 100 units/ 100 mL infusion (1 Units/hr Intravenous Rate/Dose Change 01/26/23 0532)  dextrose 50 % solution 0-50 mL (has no administration in time range)  metoprolol tartrate (LOPRESSOR) injection 10 mg (10 mg Intravenous Given 01/26/23 0542)  diltiazem (CARDIZEM) 125 mg in dextrose 5% 125 mL (1 mg/mL) infusion (  10  mg/hr Intravenous New Bag/Given 01/25/23 2347)  dextrose 5 % solution ( Intravenous Rate/Dose Change 01/26/23 0650)  heparin ADULT infusion 100 units/mL (25000 units/238mL) (850 Units/hr Intravenous New Bag/Given 01/25/23 1117)  potassium PHOSPHATE 30 mmol in dextrose 5 % 500 mL infusion (has no administration in time range)  potassium chloride 10 mEq in 100 mL IVPB (has no administration in time range)  sodium chloride 0.9 % bolus 500 mL (0 mLs Intravenous Stopped 01/23/23 1435)  sodium chloride 0.9 % bolus 1,000 mL (0 mLs Intravenous Stopped 01/23/23 1606)  metoprolol tartrate (LOPRESSOR) injection 5 mg (5 mg Intravenous Given 01/25/23 0801)  potassium chloride 10 mEq in 100 mL IVPB (10 mEq Intravenous New Bag/Given 01/24/23 1648)  diltiazem (CARDIZEM) 1 mg/mL load via infusion 15 mg (15 mg Intravenous Bolus from Bag 01/25/23 0818)  metoprolol tartrate (LOPRESSOR) injection 5 mg (5 mg Intravenous Given 01/25/23 0816)  heparin bolus via infusion 3,000 Units (3,000 Units Intravenous Bolus from Bag 01/25/23 1117)  potassium chloride 10 mEq in 100 mL IVPB (10 mEq Intravenous New Bag/Given 01/25/23 1515)  magnesium sulfate IVPB 2 g 50 mL (2 g Intravenous New Bag/Given 01/26/23 0142)  potassium chloride 10 mEq in 100 mL IVPB (10 mEq Intravenous New Bag/Given 01/25/23 2349)    Reassessment after intervention: symptoms improved.    I did obtain Additional Historical Information from daughter at bedside.   I decided to review pertinent External Data, and in summary patient with RAI at [facility] in late July of this year with Dr. Fransico Him.    Clinical Laboratory Tests Ordered, included hypernatremia with AKI noted. COVID negative. Thyroid labs pending.   Radiologic Tests Ordered, included CT head and CXR. I independently interpreted the images and agree with radiology interpretation.   Cardiac Monitor Tracing which shows NSR.    Social Determinants of Health Risk patient does not drink EtOH or use drugs.    Consult complete with [facility]. Plan for admit.   Medical Decision Making: Summary:  Patient presents emergency department with altered mental status worsening yesterday.  Recently treated in late July for a UTI which ultimately grew Klebsiella.  It appears the patient may have been transition to Augmentin which drug sensitivity showed this was sensitive to.  Unclear exactly on the timeline based on difficult history from patient and daughter at bedside.  Patient is weakly following commands and does not appear to have a focal neurologic deficit to prompt code stroke activation although considered.  Plan for CT imaging of the head with altered mental status and question of fall this morning.  Differential is broad and includes infectious etiology, AKI/dehydration versus possible thyroid issue.  It does appear that patient underwent RAI and toxic goiter was treated and so lower suspicion for thyroid storm in this setting but will follow TSH and T3/4.   Reevaluation with update and discussion with patient. Discussed labs and plan for admit.   Patient's presentation is most consistent with acute presentation with potential threat to life or bodily function.   Disposition: discharge  ____________________________________________  FINAL CLINICAL IMPRESSION(S) / ED DIAGNOSES  Final diagnoses:  Disorientation  AKI (acute kidney injury) (HCC)  Lactic acidosis      Note:  This document was prepared using Dragon voice recognition software and may include unintentional dictation errors.  Alona Bene, MD, [facility] Emergency Medicine    Tyishia Aune, Arlyss Repress, MD 01/26/23 [phone]

## 2023-01-24 ENCOUNTER — Inpatient Hospital Stay (HOSPITAL_COMMUNITY): Payer: Medicare HMO

## 2023-01-24 DIAGNOSIS — E440 Moderate protein-calorie malnutrition: Secondary | ICD-10-CM | POA: Insufficient documentation

## 2023-01-24 DIAGNOSIS — G9341 Metabolic encephalopathy: Secondary | ICD-10-CM | POA: Diagnosis not present

## 2023-01-24 DIAGNOSIS — E870 Hyperosmolality and hypernatremia: Secondary | ICD-10-CM | POA: Diagnosis not present

## 2023-01-24 DIAGNOSIS — N179 Acute kidney failure, unspecified: Secondary | ICD-10-CM

## 2023-01-24 DIAGNOSIS — A419 Sepsis, unspecified organism: Secondary | ICD-10-CM | POA: Diagnosis not present

## 2023-01-24 LAB — BETA-HYDROXYBUTYRIC ACID
Beta-Hydroxybutyric Acid: 7.32 mmol/L — ABNORMAL HIGH (ref 0.05–0.27)
Beta-Hydroxybutyric Acid: 3.73 mmol/L — ABNORMAL HIGH (ref 0.05–0.27)

## 2023-01-24 LAB — BASIC METABOLIC PANEL
BUN: 25 mg/dL — ABNORMAL HIGH (ref 8–23)
BUN: 21 mg/dL (ref 8–23)
BUN: 19 mg/dL (ref 8–23)
CO2: 10 mmol/L — ABNORMAL LOW (ref 22–32)
CO2: 16 mmol/L — ABNORMAL LOW (ref 22–32)
CO2: 15 mmol/L — ABNORMAL LOW (ref 22–32)
Calcium: 11.1 mg/dL — ABNORMAL HIGH (ref 8.9–10.3)
Calcium: 10.5 mg/dL — ABNORMAL HIGH (ref 8.9–10.3)
Calcium: 11.2 mg/dL — ABNORMAL HIGH (ref 8.9–10.3)
Chloride: >130 mmol/L (ref 98–111)
Chloride: >130 mmol/L (ref 98–111)
Chloride: >130 mmol/L (ref 98–111)
Creatinine, Ser: 1.48 mg/dL — ABNORMAL HIGH (ref 0.44–1.00)
Creatinine, Ser: 1.31 mg/dL — ABNORMAL HIGH (ref 0.44–1.00)
Creatinine, Ser: 1.28 mg/dL — ABNORMAL HIGH (ref 0.44–1.00)
GFR, Estimated: 36 mL/min — ABNORMAL LOW (ref >60)
GFR, Estimated: 42 mL/min — ABNORMAL LOW (ref >60)
GFR, Estimated: 43 mL/min — ABNORMAL LOW (ref >60)
Glucose, Bld: 137 mg/dL — ABNORMAL HIGH (ref 70–99)
Glucose, Bld: 172 mg/dL — ABNORMAL HIGH (ref 70–99)
Glucose, Bld: 176 mg/dL — ABNORMAL HIGH (ref 70–99)
Potassium: 3.3 mmol/L — ABNORMAL LOW (ref 3.5–5.1)
Potassium: 3.5 mmol/L (ref 3.5–5.1)
Potassium: 3.3 mmol/L — ABNORMAL LOW (ref 3.5–5.1)
Sodium: 163 mmol/L (ref 135–145)
Sodium: 163 mmol/L (ref 135–145)
Sodium: 164 mmol/L (ref 135–145)

## 2023-01-24 LAB — GLUCOSE, CAPILLARY
Glucose-Capillary: 116 mg/dL — ABNORMAL HIGH (ref 70–99)
Glucose-Capillary: 122 mg/dL — ABNORMAL HIGH (ref 70–99)
Glucose-Capillary: 127 mg/dL — ABNORMAL HIGH (ref 70–99)
Glucose-Capillary: 131 mg/dL — ABNORMAL HIGH (ref 70–99)
Glucose-Capillary: 136 mg/dL — ABNORMAL HIGH (ref 70–99)
Glucose-Capillary: 139 mg/dL — ABNORMAL HIGH (ref 70–99)
Glucose-Capillary: 140 mg/dL — ABNORMAL HIGH (ref 70–99)
Glucose-Capillary: 146 mg/dL — ABNORMAL HIGH (ref 70–99)
Glucose-Capillary: 147 mg/dL — ABNORMAL HIGH (ref 70–99)
Glucose-Capillary: 151 mg/dL — ABNORMAL HIGH (ref 70–99)
Glucose-Capillary: 155 mg/dL — ABNORMAL HIGH (ref 70–99)
Glucose-Capillary: 156 mg/dL — ABNORMAL HIGH (ref 70–99)

## 2023-01-24 MED ORDER — POTASSIUM CHLORIDE 10 MEQ/100ML IV SOLN
10.0000 meq | INTRAVENOUS | Status: AC
  Administered 2023-01-24 (×2): 10 meq via INTRAVENOUS
  Filled 2023-01-24 (×2): qty 100

## 2023-01-24 MED ORDER — SODIUM CHLORIDE 0.45 % IV SOLN: INTRAVENOUS | Status: DC

## 2023-01-24 MED ORDER — DEXTROSE-SODIUM CHLORIDE 5-0.45 % IV SOLN: INTRAVENOUS | Status: DC

## 2023-01-24 MED ORDER — METOPROLOL TARTRATE 25 MG PO TABS: 25.0000 mg | ORAL_TABLET | Freq: Two times a day (BID) | ORAL | Status: DC

## 2023-01-24 MED ORDER — MELATONIN 3 MG PO TABS: 3.0000 mg | ORAL_TABLET | Freq: Every evening | ORAL | Status: DC | PRN

## 2023-01-24 MED ORDER — LACTATED RINGERS IV SOLN: INTRAVENOUS | Status: DC

## 2023-01-24 MED ORDER — DEXTROSE 50 % IV SOLN: 0.0000 mL | INTRAVENOUS | Status: DC | PRN

## 2023-01-24 MED ORDER — ADULT MULTIVITAMIN W/MINERALS CH
1.0000 | ORAL_TABLET | Freq: Every day | ORAL | Status: DC
  Administered 2023-01-24: 1 via ORAL
  Filled 2023-01-24 (×2): qty 1

## 2023-01-24 MED ORDER — METOPROLOL TARTRATE 25 MG PO TABS
25.0000 mg | ORAL_TABLET | Freq: Two times a day (BID) | ORAL | Status: DC
  Administered 2023-01-24: 25 mg via ORAL
  Filled 2023-01-24: qty 1

## 2023-01-24 MED ORDER — METOPROLOL TARTRATE 5 MG/5ML IV SOLN
5.0000 mg | INTRAVENOUS | Status: AC | PRN
  Administered 2023-01-24 – 2023-01-25 (×3): 5 mg via INTRAVENOUS
  Filled 2023-01-24 (×3): qty 5

## 2023-01-24 MED ORDER — ENSURE ENLIVE PO LIQD
237.0000 mL | Freq: Two times a day (BID) | ORAL | Status: DC
  Administered 2023-01-24: 237 mL via ORAL

## 2023-01-24 MED ORDER — METOPROLOL TARTRATE 5 MG/5ML IV SOLN
10.0000 mg | Freq: Four times a day (QID) | INTRAVENOUS | Status: DC
  Administered 2023-01-24 – 2023-01-26 (×8): 10 mg via INTRAVENOUS
  Filled 2023-01-24 (×8): qty 10

## 2023-01-24 MED ORDER — INSULIN REGULAR(HUMAN) IN NACL 100-0.9 UT/100ML-% IV SOLN
INTRAVENOUS | Status: DC
  Administered 2023-01-24: 1.5 [IU]/h via INTRAVENOUS
  Filled 2023-01-24: qty 100

## 2023-01-24 MED ORDER — DEXTROSE IN LACTATED RINGERS 5 % IV SOLN: INTRAVENOUS | Status: DC

## 2023-01-24 NOTE — Progress Notes (Signed)
Initial Nutrition Assessment  DOCUMENTATION CODES:   Non-severe (moderate) malnutrition in context of chronic illness  INTERVENTION:  Multivitamin w/ minerals daily Ensure Enlive po BID, each supplement provides 350 kcal and 20 grams of protein. Liberalize diet to regular due to poor PO  Encourage good PO intake  Provide plastic utensils due to taste changes  NUTRITION DIAGNOSIS:   Moderate Malnutrition related to chronic illness as evidenced by percent weight loss, mild fat depletion, moderate muscle depletion.  GOAL:   Patient will meet greater than or equal to 90% of their needs  MONITOR:   PO intake, Supplement acceptance, Labs, I & O's, Weight trends  REASON FOR ASSESSMENT:   Malnutrition Screening Tool    ASSESSMENT:   77 y.o. female presented to the ED with increased confusion and weakness. PMH includes CKD III, T2DM, HLD, HTN, and hyperthyroidism. Pt admitted with sepsis 2/2 UTI, AKI on CKD.   Met with pt and family in room, daughter provided history. Reports that pt PO intake has been poor for a little while now. Was complaining about metallic taste after taking radiation medications. Discussed using plastic utensils in place of metal to see if that helps. Reports that pt was drinking ok and drinking ~1 Ensure per day at home. Pt does wear dentures, does not have them with her. Family thinks that she may have difficulty chewing and swallowing; agreeable to SLP evaluation. RD discussed with MD, MD to order.  Endorses ongoing weight loss, last known weight was 158# and that was below pt UBW. Family also thinks she has continued to loose weight. Uses a walker at baseline to ambulate. Per weight history, pt with a 22% weight loss since January 2024, this is clinically significant for time frame.   Discussed ordering Ensure while in the hospital, family agreeable. Daughter to help with meal ordering to ensure pt is getting foods she likes. Reviewed menu with family.    Medications reviewed and include: NovoLog SSI + 2 units, IV antibiotics  Labs reviewed: Sodium 160, Potassium 3.7, BUN 31, Creatinine 1.69, Hgb A1c 9.8   CBG: 139-268 x 24 hrs  NUTRITION - FOCUSED PHYSICAL EXAM:  Flowsheet Row Most Recent Value  Orbital Region Mild depletion  Upper Arm Region Mild depletion  Thoracic and Lumbar Region No depletion  Buccal Region Mild depletion  Temple Region No depletion  Clavicle Bone Region Moderate depletion  Clavicle and Acromion Bone Region Moderate depletion  Scapular Bone Region Moderate depletion  Dorsal Hand Severe depletion  Patellar Region Moderate depletion  Anterior Thigh Region Moderate depletion  Posterior Calf Region Moderate depletion  Edema (RD Assessment) None  Hair Reviewed  Eyes Unable to assess  Mouth Unable to assess  Skin Reviewed  Nails Unable to assess   Diet Order:   Diet Order             Diet regular Room service appropriate? No; Fluid consistency: Thin  Diet effective now                  EDUCATION NEEDS:  Education needs have been addressed  Skin:  Skin Assessment: Reviewed RN Assessment  Last BM:  Unknown/PTA  Height:  Ht Readings from Last 1 Encounters:  01/23/23 5\' 1"  (1.549 m)   Weight:  Wt Readings from Last 1 Encounters:  01/23/23 63.6 kg   Ideal Body Weight:  47.7 kg  BMI:  Body mass index is 26.49 kg/m.  Estimated Nutritional Needs:  Kcal:  1700-1900 Protein:  85-105 grams  Fluid:  >/= 1.7 L   Kirby Crigler RD, LDN Clinical Dietitian See [facility] for contact information.

## 2023-01-24 NOTE — Progress Notes (Signed)
[facility] night cross cover note:   I was notified by RN of this patient sinus tachycardia, with sustained heart rates in the 130s.  Most recent blood pressure 152/77, and no report of any new/worsening symptoms with these heart rates.  It appears that the patient's heart rate has been running high throughout the day, with documentation of heart rates in the 1 teens to 140s over that time, this patient was admitted earlier today for sepsis due to urinary tract infection.  Without any new symptoms and with stable BP, will refrain from aggressive rate control of her her sinus tachycardia at this time , as there appears to be a compensatory aspect in the setting of her sepsis.  I have placed order for prn iv lopressor for sustained heart rate greater than 130, but will focus more on management of the underlying causes, including the sepsis.    Newton Pigg, DO Hospitalist

## 2023-01-24 NOTE — Evaluation (Signed)
Clinical/Bedside Swallow Evaluation Patient Details  Name: Samantha [name] MRN: [id number] Date of Birth: [birth year]  Today's Date: 01/24/2023 Time: SLP Start Time (ACUTE ONLY): 1717 SLP Stop Time (ACUTE ONLY): 1734 SLP Time Calculation (min) (ACUTE ONLY): 17 min  Past Medical History:  Past Medical History:  Diagnosis Date   Allergic rhinitis    Arthritis of both knees    Arthritis of both shoulder regions    Bilateral carpal tunnel syndrome    Cataract    Chronic low back pain    CKD (chronic kidney disease), stage III (HCC)    Decreased vision    Diabetes mellitus    HTN (hypertension)    Hyperlipidemia    Morbid obesity (HCC)    Thyroid disease    Past Surgical History:  Past Surgical History:  Procedure Laterality Date   CATARACT EXTRACTION     COLONOSCOPY     COLONOSCOPY N/A 09/15/2020   Procedure: COLONOSCOPY;  Surgeon: Corbin Ade, MD;  Location: [facility];  Service: Endoscopy;  Laterality: N/A;  ASA II / PM procedure   POLYPECTOMY  09/15/2020   Procedure: POLYPECTOMY;  Surgeon: Corbin Ade, MD;  Location: [facility];  Service: Endoscopy;;   TUBAL LIGATION     HPI:  Samantha [name] is a 77 yo female presenting to ED 8/13 with increased confusion and progressive weakness over the last 24 hours. Found to have sepsis after recent UTI diagnosis. CTH negative. CXR revealed shallow inspiration with linear fibrosis or atelectasis in lung bases. 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 Oral motor function difficult to assess due to pt's difficulty following commands, although note pt with missing dentition and thick secretions. SLP provided oral care, removing thick brown secretions from her lingual surface and buccal  cavities. Attempted to provide trials of thin liquids via straw with no noted initiation of a sip. SLP then attempted trials of single ice chip via spoon, during which pt held her mouth open and made no effort to close her mouth and initiate mastication or swallowing. Ice chip was removed whole from her oral cavity and no further POs were attempted. At this time, pt does not appear appropriate for PO diet due to AMS and lethargy. Recommend NPO. Will f/u to assess readiness for PO diet as pt's mentation improves. SLP Visit Diagnosis: Dysphagia, unspecified (R13.10)    Aspiration Risk  Moderate aspiration risk    Diet Recommendation NPO    Medication Administration: Via alternative means    Other  Recommendations Oral Care Recommendations: Oral care QID    Recommendations for follow up therapy are one component of a multi-disciplinary discharge planning process, led by the attending physician.  Recommendations may be updated based on patient status, additional functional criteria and insurance authorization.  Follow up Recommendations Skilled nursing-short term rehab (<3 hours/day)      Assistance Recommended at Discharge    Functional Status Assessment Patient has had a recent decline in their functional status and demonstrates the ability to make significant improvements in function in a reasonable and predictable amount of time.  Frequency and Duration min 2x/week  2 weeks       Prognosis Prognosis for improved oropharyngeal function: Good Barriers to Reach Goals: Cognitive deficits  Swallow Study   General HPI: Samantha [name] is a 77 yo female presenting to ED 8/13 with increased confusion and progressive weakness over the last 24 hours. Found to have sepsis after recent UTI diagnosis. CTH negative. CXR revealed shallow inspiration with linear fibrosis or atelectasis in lung bases. 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, UTI, HTN, T2DM, HLDL,  chronic lower back pain, CKDIII Type of Study: Bedside Swallow Evaluation Previous Swallow Assessment: none in chart Diet Prior to this Study: Regular;Thin liquids (Level 0) Temperature Spikes Noted: No Respiratory Status: Nasal cannula History of Recent Intubation: No Behavior/Cognition: Lethargic/Drowsy;Requires cueing;Doesn't follow directions Oral Cavity Assessment: Dried secretions;Excessive secretions Oral Care Completed by SLP: Yes Oral Cavity - Dentition: Missing dentition;Dentures, not available;Edentulous Vision: Functional for self-feeding Self-Feeding Abilities: Total assist Patient Positioning: Upright in bed Baseline Vocal Quality: Not observed Volitional Cough: Cognitively unable to elicit Volitional Swallow: Unable to elicit    Oral/Motor/Sensory Function Overall Oral Motor/Sensory Function: Within functional limits   Ice Chips Ice chips: Impaired Presentation: Spoon Oral Phase Impairments: Poor awareness of bolus;Reduced labial seal;Reduced lingual movement/coordination Oral Phase Functional Implications: Right anterior spillage;Left anterior spillage;Oral holding   Thin Liquid Thin Liquid: Impaired Presentation: Straw Oral Phase Impairments: Poor awareness of bolus;Reduced labial seal;Reduced lingual movement/coordination    Nectar Thick Nectar Thick Liquid: Not tested   Honey Thick Honey Thick Liquid: Not tested   Puree Puree: Impaired Presentation: Spoon Oral Phase Impairments: Reduced labial seal;Reduced lingual movement/coordination;Poor awareness of bolus   Solid     Solid: Not tested      Gwynneth Aliment, M.A., CF-SLP Speech Language Pathology, Acute Rehabilitation Services  Secure Chat preferred [phone]  01/24/2023,5:58 PM

## 2023-01-24 NOTE — Inpatient Diabetes Management (Addendum)
Inpatient Diabetes Program Recommendations  AACE/ADA: New Consensus Statement on Inpatient Glycemic Control (2015)  Target Ranges:  Prepandial:   less than 140 mg/dL      Peak postprandial:   less than 180 mg/dL (1-2 hours)      Critically ill patients:  140 - 180 mg/dL   Lab Results  Component Value Date   GLUCAP 139 (H) 01/24/2023   HGBA1C 9.8 (H) 01/23/2023    Review of Glycemic Control  Diabetes history: DM2 Outpatient Diabetes medications: Jardiance 25 mg every day, Metformin 500 mg QD Current orders for Inpatient glycemic control: Novolog 0-9 units TID and 2 units TID with meals  Inpatient Diabetes Program Recommendations:    Admitted with UTI with sepsis.  Patient takes Jardiance 25 mg every day at home.  SGLT-2i's  can increase the risk of frequent UTI's.  Might consider discontinuing Jardiance at time of discharge.  Addendum@14 :14:  Spoke with daughter at bedside.  Confirmed above home medications.  Reviewed patient's current A1c of 9.8% (average BG of 235 mg/dL). Explained what a A1c is and what it measures. Also reviewed goal A1c with patient, importance of good glucose control @ home, and blood sugar goals.  Explained given her age an A1C of 7-8% is appropriate.  This is a BG of 150-180 mg/dL.  Asked daughter to limit her beverages with sugar.  Discussed long and short term complications of uncontrolled glucose.    Denies hypoglycemia and is aware of signs, symptoms and treatments.     Will continue to follow while inpatient.  Thank you, Dulce Sellar, MSN, CDCES Diabetes Coordinator Inpatient Diabetes Program [phone] (team pager from 8a-5p)

## 2023-01-24 NOTE — Progress Notes (Signed)
[facility] night cross cover note:  Straight cath x 1 now ordered for bladder scan > 400 cc's, which was prompted by very little urine output thus far on night shift.    Newton Pigg, DO Hospitalist

## 2023-01-24 NOTE — Progress Notes (Addendum)
PROGRESS NOTE        PATIENT DETAILS Name: Samantha [name] Age: 77 y.o. Sex: female Date of Birth: 01/07/1946 Admit Date: 01/23/2023 Admitting Physician Lurline Del, MD YNW:GNFAO, Devonne Doughty, NP  Brief Summary: Patient is a 77 y.o.  female with history of DM-2, HTN, hyperthyroidism-s/p RAI radioactive iodine ablation 06/24-who was apparently diagnosed with a UTI-initially on Bactrim-then changed to Augmentin-presented with altered mental status.  Patient was found to have AKI/metabolic acidosis and hypernatremia-and subsequently admitted to the hospitalist service.  Significant events: 8/13>> admit to [facility]  Significant studies: 8/13>> CT head: No acute intracranial abnormality 8/13>> CXR: No obvious PNA 8/13>> renal ultrasound: No hydronephrosis  Significant microbiology data: 7/25>> urine culture: Klebsiella 8/13>> COVID PCR: Negative 8/13>> blood cultures: Negative 8/14>> urine culture: Pending  Procedures: None  Consults: None  Subjective: Confused-but easily opens eyes-able to answer in single word sentences.  Daughter at bedside.  Objective: Vitals: Blood pressure (!) 146/77, pulse (!) 126, temperature 98.6 F (37 C), temperature source Axillary, resp. rate (!) 23, height 5\' 1"  (1.549 m), weight 63.6 kg, SpO2 100%.   Exam: Gen Exam: Confused-not in any distress HEENT:atraumatic, normocephalic Chest: B/L clear to auscultation anteriorly CVS:S1S2 regular Abdomen:soft non tender, non distended Extremities:no edema Neurology: Difficult exam but appears nonfocal Skin: no rash  Pertinent Labs/Radiology:    Latest Ref Rng & Units 01/24/2023    1:42 AM 01/23/2023    6:51 PM 01/23/2023    1:19 PM  CBC  WBC 4.0 - 10.5 K/uL 12.9  12.9  11.7   Hemoglobin 12.0 - 15.0 g/dL 13.0  86.5  78.4   Hematocrit 36.0 - 46.0 % 38.5  42.3  45.4   Platelets 150 - 400 K/uL 295  241  397     Lab Results  Component Value Date   NA 160 (H) 01/24/2023   K  3.7 01/24/2023   CL 128 (H) 01/24/2023   CO2 13 (L) 01/24/2023      Assessment/Plan: Acute metabolic encephalopathy Likely due to hypernatremia/AKI/possible euglycemic DKA CT head negative IVF changed to half-normal-hopefully mentation will improve after correction of AKI and hypernatremia-if not-further workup to be initiated  AKI Likely hemodynamically mediated (poor oral intake and lisinopril use) and recent Bactrim use Continue to hold nephrotoxic agents including lisinopril/Bactrim/metformin Gently hydrate-and recheck electrolytes tomorrow Renal ultrasound negative for hydronephrosis  Hypernatremia Secondary to poor oral intake in the setting of recent UTI Sodium levels have worsened overnight-changing IV fluid to half-normal Repeat electrolytes later this afternoon  Anion gap metabolic acidosis ?  Euglycemic DKA in the setting of Jardiance use-although this could be very well from AKI and metformin use Abdominal exam is benign-recent lactate level within normal limits Given diagnostic uncertainty-will start IV insulin to see if acidosis will improve. Continue to treat underlying AKI as well.  Hypercalcemia Mild Suspect this is mostly due to dehydration/hemoconcentration Hydrating with IVF-and following labs.  Recent complicated UTI with Klebsiella pneumoniae Initially on Bactrim-then switched to Augmentin (as an outpatient) Doubt sepsis physiology-do not think it was present on admission Suspect altered mental status is from AKI/hypernatremia rather than sepsis physiology Empirically covered with IV Rocephin for now-follow cultures.  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 S/p recent radioactive iodine ablation on 6/18 TSH still suppressed Follow-up with endocrine as an outpatient  HTN BP stable-continue metoprolol Metformin on hold  DM-2 (  A1c 9.8 on 8/13) SSI  Recent Labs    01/24/23 0621 01/24/23 0817 01/24/23 1145  GLUCAP 151* 139* 122*      Nutrition Status: Nutrition Problem: Moderate Malnutrition Etiology: chronic illness Signs/Symptoms: percent weight loss, mild fat depletion, moderate muscle depletion Percent weight loss: 22 % Interventions: Ensure Enlive (each supplement provides 350kcal and 20 grams of protein), MVI, Liberalize Diet    BMI: Estimated body mass index is 26.49 kg/m as calculated from the following:   Height as of this encounter: 5\' 1"  (1.549 m).   Weight as of this encounter: 63.6 kg.   Code status:   Code Status: Full Code   DVT Prophylaxis: heparin injection 5,000 Units Start: 01/23/23 1630   Family Communication: Daughter at bedside   Disposition Plan: Status is: Inpatient Remains inpatient appropriate because: Severity of illness   Planned Discharge Destination:Home health   Diet: Diet Order             Diet regular Room service appropriate? No; Fluid consistency: Thin  Diet effective now                     Antimicrobial agents: Anti-infectives (From admission, onward)    Start     Dose/Rate Route Frequency Ordered Stop   01/24/23 1500  cefTRIAXone (ROCEPHIN) 1 g in sodium chloride 0.9 % 100 mL IVPB        1 g 200 mL/hr over 30 Minutes Intravenous Every 24 hours 01/23/23 1836 01/30/23 1459   01/23/23 1430  cefTRIAXone (ROCEPHIN) 2 g in sodium chloride 0.9 % 100 mL IVPB  Status:  Discontinued        2 g 200 mL/hr over 30 Minutes Intravenous Every 24 hours 01/23/23 1417 01/23/23 1835        MEDICATIONS: Scheduled Meds:  aspirin EC  81 mg Oral Daily   feeding supplement  237 mL Oral BID BM   heparin  5,000 Units Subcutaneous Q8H   insulin aspart  0-9 Units Subcutaneous TID WC   insulin aspart  2 Units Subcutaneous TID WC   metoprolol tartrate  25 mg Oral BID   multivitamin with minerals  1 tablet Oral Daily   Continuous Infusions:  sodium chloride 125 mL/hr at 01/24/23 4696   cefTRIAXone (ROCEPHIN)  IV     PRN Meds:.acetaminophen **OR** acetaminophen,  albuterol, melatonin, metoprolol tartrate, ondansetron **OR** ondansetron (ZOFRAN) IV   I have personally reviewed following labs and imaging studies  LABORATORY DATA: CBC: Recent Labs  Lab 01/19/23 1527 01/23/23 1319 01/23/23 1851 01/24/23 0142  WBC 8.7 11.7* 12.9* 12.9*  NEUTROABS  --  9.8*  --   --   HGB 11.8* 13.2 12.2 11.2*  HCT 39.2 45.4 42.3 38.5  MCV 84.3 85.0 84.9 85.2  PLT 472* 397 241 295    Basic Metabolic Panel: Recent Labs  Lab 01/19/23 1527 01/23/23 1319 01/23/23 1851 01/24/23 0142  NA 139 155* 161* 160*  K 3.6 3.6 3.8 3.7  CL 102 118* 123* 128*  CO2 23 13* 13* 13*  GLUCOSE 381* 297* 216* 174*  BUN 26* 31* 34* 31*  CREATININE 1.27* 1.98* 1.84* 1.69*  CALCIUM 11.7* 12.4* 11.5* 11.0*    GFR: Estimated Creatinine Clearance: 24.2 mL/min (A) (by C-G formula based on SCr of 1.69 mg/dL (H)).  Liver Function Tests: Recent Labs  Lab 01/19/23 1527 01/23/23 1319 01/24/23 0142  AST 18 23 17   ALT 18 20 17   ALKPHOS 99 88 70  BILITOT 0.5 1.0  0.9  PROT 7.3 7.7 6.3*  ALBUMIN 2.9* 3.3* 2.6*   Recent Labs  Lab 01/19/23 1527 01/23/23 1319  LIPASE 106* 338*   No results for input(s): "AMMONIA" in the last 168 hours.  Coagulation Profile: No results for input(s): "INR", "PROTIME" in the last 168 hours.  Cardiac Enzymes: No results for input(s): "CKTOTAL", "CKMB", "CKMBINDEX", "TROPONINI" in the last 168 hours.  BNP (last 3 results) No results for input(s): "PROBNP" in the last 8760 hours.  Lipid Profile: No results for input(s): "CHOL", "HDL", "LDLCALC", "TRIG", "CHOLHDL", "LDLDIRECT" in the last 72 hours.  Thyroid Function Tests: Recent Labs    01/23/23 1319 01/23/23 1851  TSH 0.028*  --   T4TOTAL 17.5*  --   FREET4  --  4.47*    Anemia Panel: No results for input(s): "VITAMINB12", "FOLATE", "FERRITIN", "TIBC", "IRON", "RETICCTPCT" in the last 72 hours.  Urine analysis:    Component Value Date/Time   COLORURINE YELLOW 01/24/2023 0336    APPEARANCEUR HAZY (A) 01/24/2023 0336   LABSPEC 1.018 01/24/2023 0336   PHURINE 5.0 01/24/2023 0336   GLUCOSEU >=500 (A) 01/24/2023 0336   HGBUR SMALL (A) 01/24/2023 0336   HGBUR negative 11/11/2007 1104   BILIRUBINUR NEGATIVE 01/24/2023 0336   KETONESUR 20 (A) 01/24/2023 0336   PROTEINUR NEGATIVE 01/24/2023 0336   UROBILINOGEN 0.2 11/18/2014 2105   NITRITE NEGATIVE 01/24/2023 0336   LEUKOCYTESUR NEGATIVE 01/24/2023 0336    Sepsis Labs: Lactic Acid, Venous    Component Value Date/Time   LATICACIDVEN 1.9 01/23/2023 1716    MICROBIOLOGY: Recent Results (from the past 240 hour(s))  Resp panel by RT-PCR (RSV, Flu A&B, Covid) Urine, Clean Catch     Status: None   Collection Time: 01/19/23  8:02 PM   Specimen: Urine, Clean Catch; Nasal Swab  Result Value Ref Range Status   SARS Coronavirus 2 by RT PCR NEGATIVE NEGATIVE Final   Influenza A by PCR NEGATIVE NEGATIVE Final   Influenza B by PCR NEGATIVE NEGATIVE Final    Comment: (NOTE) The Xpert Xpress SARS-CoV-2/FLU/RSV plus assay is intended as an aid in the diagnosis of influenza from Nasopharyngeal swab specimens and should not be used as a sole basis for treatment. Nasal washings and aspirates are unacceptable for Xpert Xpress SARS-CoV-2/FLU/RSV testing.  Fact Sheet for Patients: BloggerCourse.com  Fact Sheet for Healthcare Providers: SeriousBroker.it  This test is not yet approved or cleared by the [place] FDA and has been authorized for detection and/or diagnosis of SARS-CoV-2 by FDA under an Emergency Use Authorization (EUA). This EUA will remain in effect (meaning this test can be used) for the duration of the COVID-19 declaration under Section 564(b)(1) of the Act, 21 U.S.C. section 360bbb-3(b)(1), unless the authorization is terminated or revoked.     Resp Syncytial Virus by PCR NEGATIVE NEGATIVE Final    Comment: (NOTE) Fact Sheet for  Patients: BloggerCourse.com  Fact Sheet for Healthcare Providers: SeriousBroker.it  This test is not yet approved or cleared by the [place] FDA and has been authorized for detection and/or diagnosis of SARS-CoV-2 by FDA under an Emergency Use Authorization (EUA). This EUA will remain in effect (meaning this test can be used) for the duration of the COVID-19 declaration under Section 564(b)(1) of the Act, 21 U.S.C. section 360bbb-3(b)(1), unless the authorization is terminated or revoked.  Performed at [facility], [address] [place], [place] [address]   Culture, blood (routine x 2)     Status: None (Preliminary result)  Collection Time: 01/23/23  1:00 PM   Specimen: BLOOD  Result Value Ref Range Status   Specimen Description BLOOD RIGHT ANTECUBITAL  Final   Special Requests   Final    BOTTLES DRAWN AEROBIC AND ANAEROBIC Blood Culture results may not be optimal due to an inadequate volume of blood received in culture bottles   Culture   Final    NO GROWTH < 24 HOURS Performed at [facility], [address] [place], [place] [address]    Report Status PENDING  Incomplete  Culture, blood (routine x 2)     Status: None (Preliminary result)   Collection Time: 01/23/23  1:19 PM   Specimen: BLOOD  Result Value Ref Range Status   Specimen Description BLOOD BLOOD LEFT ARM  Final   Special Requests   Final    BOTTLES DRAWN AEROBIC AND ANAEROBIC Blood Culture adequate volume   Culture   Final    NO GROWTH < 24 HOURS Performed at [facility], [address], [place], [place] [address]    Report Status PENDING  Incomplete  SARS Coronavirus 2 by RT PCR (hospital order, performed in [facility] hospital lab) *cepheid single result test* Anterior Nasal Swab     Status: None   Collection Time: 01/23/23  1:19 PM   Specimen: Anterior Nasal Swab  Result Value Ref Range Status   SARS Coronavirus 2 by RT PCR  NEGATIVE NEGATIVE Final    Comment: Performed at [facility], [address], [place], [place] [address]    RADIOLOGY STUDIES/RESULTS: ECHOCARDIOGRAM COMPLETE  Result Date: 01/24/2023    ECHOCARDIOGRAM REPORT   Patient Name:   FIA MCMURRY Date of Exam: 01/24/2023 Medical Rec #:  [id number]   Height:       61.0 in Accession #:    [id number]  Weight:       140.2 lb Date of Birth:  02-06-1946   BSA:          1.624 m Patient Age:    77 years    BP:           142/79 mmHg Patient Gender: F           HR:           122 bpm. Exam Location:  Inpatient Procedure: 2D Echo, Cardiac Doppler and Color Doppler Indications:    Elevated troponin  History:        Patient has no prior history of Echocardiogram examinations.                 CKD, Signs/Symptoms:Fatigue; Risk Factors:Dyslipidemia,                 Hypertension and Diabetes. Sepsis.  Sonographer:    Wallie Char Referring Phys: Skip Mayer, A IMPRESSIONS  1. Elevated LVOT peak velocity of 4 m/s likely due to hyperdynamic LV function and chordal SAM.  2. Left ventricular ejection fraction, by estimation, is >75%. The left ventricle has hyperdynamic function. The left ventricle has no regional wall motion abnormalities. There is mild concentric left ventricular hypertrophy. Left ventricular diastolic parameters are consistent with Grade I diastolic dysfunction (impaired relaxation).  3. Right ventricular systolic function is normal. The right ventricular size is normal.  4. The mitral valve is normal in structure. No evidence of mitral valve regurgitation. No evidence of mitral stenosis.  5. The aortic valve is normal in structure. Aortic valve regurgitation is not visualized. No aortic stenosis is present.  6. The inferior vena cava is dilated in size with >50% respiratory variability, suggesting right atrial pressure of 8 mmHg. Comparison(s): No prior Echocardiogram. FINDINGS  Left Ventricle: Left ventricular ejection fraction, by estimation, is >75%.  The left ventricle has hyperdynamic function. The left ventricle has no regional wall motion abnormalities. The left ventricular internal cavity size was normal in size. There is mild concentric left ventricular hypertrophy. Left ventricular diastolic parameters are consistent with Grade I diastolic dysfunction (impaired relaxation). Right Ventricle: The right ventricular size is normal. Right ventricular systolic function is normal. Left Atrium: Left atrial size was normal in size. Right Atrium: Right atrial size was normal in size. Pericardium: Trivial pericardial effusion is present. Mitral Valve: The mitral valve is normal in structure. No evidence of mitral valve regurgitation. No evidence of mitral valve stenosis. MV peak gradient, 6.2 mmHg. The mean mitral valve gradient is 3.0 mmHg. Tricuspid Valve: The tricuspid valve is normal in structure. Tricuspid valve regurgitation is mild . No evidence of tricuspid stenosis. Aortic Valve: The aortic valve is normal in structure. Aortic valve regurgitation is not visualized. No aortic stenosis is present. Aortic valve mean gradient measures 10.0 mmHg. Aortic valve peak gradient measures 17.3 mmHg. Aortic valve area, by VTI measures 1.57 cm. Pulmonic Valve: The pulmonic valve was normal in structure. Pulmonic valve regurgitation is not visualized. No evidence of pulmonic stenosis. Aorta: The aortic root is normal in size and structure. Venous: The inferior vena cava is dilated in size with greater than 50% respiratory variability, suggesting right atrial pressure of 8 mmHg. IAS/Shunts: No atrial level shunt detected by color flow Doppler. Additional Comments: Elevated LVOT peak velocity of 4 m/s likely due to hyperdynamic LV function and chordal SAM.  LEFT VENTRICLE PLAX 2D LVIDd:         3.50 cm     Diastology LVIDs:         2.00 cm     LV e' medial:    6.81 cm/s LV PW:         0.90 cm     LV E/e' medial:  13.4 LV IVS:        0.70 cm     LV e' lateral:   6.73 cm/s  LVOT diam:     1.50 cm     LV E/e' lateral: 13.6 LV SV:         45 LV SV Index:   28 LVOT Area:     1.77 cm  LV Volumes (MOD) LV vol d, MOD A2C: 42.3 ml LV vol d, MOD A4C: 57.2 ml LV vol s, MOD A2C: 11.1 ml LV vol s, MOD A4C: 12.6 ml LV SV MOD A2C:     31.2 ml LV SV MOD A4C:     57.2 ml LV SV MOD BP:      37.5 ml RIGHT VENTRICLE             IVC RV Basal diam:  2.90 cm     IVC diam: 2.30 cm RV S prime:     21.00 cm/s TAPSE (M-mode): 2.1 cm LEFT ATRIUM             Index        RIGHT ATRIUM          Index LA diam:        3.20 cm 1.97 cm/m   RA Area:     8.47 cm LA Vol (A2C):   36.6 ml 22.54 ml/m  RA Volume:  15.80 ml 9.73 ml/m LA Vol (A4C):   37.9 ml 23.34 ml/m LA Biplane Vol: 39.4 ml 24.26 ml/m  AORTIC VALVE AV Area (Vmax):    1.60 cm AV Area (Vmean):   1.55 cm AV Area (VTI):     1.57 cm AV Vmax:           208.00 cm/s AV Vmean:          145.500 cm/s AV VTI:            0.288 m AV Peak Grad:      17.3 mmHg AV Mean Grad:      10.0 mmHg LVOT Vmax:         188.00 cm/s LVOT Vmean:        127.500 cm/s LVOT VTI:          0.256 m LVOT/AV VTI ratio: 0.89 MITRAL VALVE                TRICUSPID VALVE MV Area (PHT): 3.89 cm     TR Peak grad:   36.5 mmHg MV Area VTI:   2.18 cm     TR Vmax:        302.00 cm/s MV Peak grad:  6.2 mmHg MV Mean grad:  3.0 mmHg     SHUNTS MV Vmax:       1.24 m/s     Systemic VTI:  0.26 m MV Vmean:      74.9 cm/s    Systemic Diam: 1.50 cm MV Decel Time: 195 msec MV E velocity: 91.30 cm/s MV A velocity: 136.00 cm/s MV E/A ratio:  0.67 Olga Millers MD Electronically signed by Olga Millers MD Signature Date/Time: 01/24/2023/10:45:00 AM    Final    US RENAL  Result Date: 01/23/2023 CLINICAL DATA:  Urinary tract infection. EXAM: RENAL / URINARY TRACT ULTRASOUND COMPLETE COMPARISON:  Ultrasound dated 12/02/2020. FINDINGS: Right Kidney: Renal measurements: 8.2 x 4.3 x 4.5 cm = volume: 83 mL. Normal echogenicity. No hydronephrosis or shadowing stone. Left Kidney: Renal measurements: 9.2 x 5.1 x  5.7 cm = volume: 139 mL. Normal echogenicity. No hydronephrosis or shadowing stone. Bladder: Appears normal for degree of bladder distention. Other: None. IMPRESSION: Unremarkable renal ultrasound. Electronically Signed   By: Elgie Collard M.D.   On: 01/23/2023 20:20   DG CHEST PORT 1 VIEW  Result Date: 01/23/2023 CLINICAL DATA:  Cough. Altered mental status. Treatment for urinary tract infection for 4 days. EXAM: PORTABLE CHEST 1 VIEW COMPARISON:  CT 01/19/2023 FINDINGS: Shallow inspiration. Linear atelectasis or fibrosis in both lung bases. No developing consolidation or edema. Normal heart size. Mediastinal contours appear intact. No pleural effusion or pneumothorax. Degenerative changes in the spine and shoulders. IMPRESSION: Shallow inspiration with linear fibrosis or atelectasis in the lung bases. Electronically Signed   By: Burman Nieves M.D.   On: 01/23/2023 17:53   CT Head Wo Contrast  Result Date: 01/23/2023 CLINICAL DATA:  Mental status change, unknown cause EXAM: CT HEAD WITHOUT CONTRAST TECHNIQUE: Contiguous axial images were obtained from the base of the skull through the vertex without intravenous contrast. RADIATION DOSE REDUCTION: This exam was performed according to the departmental dose-optimization program which includes automated exposure control, adjustment of the mA and/or kV according to patient size and/or use of iterative reconstruction technique. COMPARISON:  CT Head 11/14/11 FINDINGS: Brain: No evidence of acute infarction, hemorrhage, hydrocephalus, extra-axial collection or mass lesion/mass effect. Vascular: No hyperdense vessel or unexpected calcification. Skull: Normal. Negative for fracture or focal lesion.  Sinuses/Orbits: No middle ear or mastoid effusion. Paranasal sinuses are clear. Bilateral lens replacement. Orbits are otherwise unremarkable. Other: None. IMPRESSION: No acute intracranial abnormality. Electronically Signed   By: Lorenza Cambridge M.D.   On: 01/23/2023  16:14     LOS: 1 day   Jeoffrey Massed, MD  [facility]    To contact the attending provider between 7A-7P or the covering provider during after hours 7P-7A, please log into the web site [email or web address] and access using universal [place] password for that web site. If you do not have the password, please call the hospital operator.  01/24/2023, 1:37 PM

## 2023-01-24 NOTE — Plan of Care (Signed)
Problem: Education: Goal: Ability to describe self-care measures that may prevent or decrease complications (Diabetes Survival Skills Education) will improve Outcome: Progressing Goal: Individualized Educational Video(s) Outcome: Progressing   Problem: Coping: Goal: Ability to adjust to condition or change in health will improve Outcome: Progressing   Problem: Fluid Volume: Goal: Ability to maintain a balanced intake and output will improve Outcome: Progressing   Problem: Health Behavior/Discharge Planning: Goal: Ability to identify and utilize available resources and services will improve Outcome: Progressing Goal: Ability to manage health-related needs will improve Outcome: Progressing   Problem: Metabolic: Goal: Ability to maintain appropriate glucose levels will improve Outcome: Progressing   Problem: Nutritional: Goal: Maintenance of adequate nutrition will improve Outcome: Progressing Goal: Progress toward achieving an optimal weight will improve Outcome: Progressing   Problem: Skin Integrity: Goal: Risk for impaired skin integrity will decrease Outcome: Progressing   Problem: Tissue Perfusion: Goal: Adequacy of tissue perfusion will improve Outcome: Progressing   Problem: Education: Goal: Knowledge of General Education information will improve Description: Including pain rating scale, medication(s)/side effects and non-pharmacologic comfort measures Outcome: Progressing   Problem: Health Behavior/Discharge Planning: Goal: Ability to manage health-related needs will improve Outcome: Progressing   Problem: Clinical Measurements: Goal: Ability to maintain clinical measurements within normal limits will improve Outcome: Progressing Goal: Will remain free from infection Outcome: Progressing Goal: Diagnostic test results will improve Outcome: Progressing Goal: Respiratory complications will improve Outcome: Progressing Goal: Cardiovascular complication will  be avoided Outcome: Progressing   Problem: Activity: Goal: Risk for activity intolerance will decrease Outcome: Progressing   Problem: Nutrition: Goal: Adequate nutrition will be maintained Outcome: Progressing   Problem: Coping: Goal: Level of anxiety will decrease Outcome: Progressing   Problem: Elimination: Goal: Will not experience complications related to bowel motility Outcome: Progressing Goal: Will not experience complications related to urinary retention Outcome: Progressing   Problem: Pain Managment: Goal: General experience of comfort will improve Outcome: Progressing   Problem: Safety: Goal: Ability to remain free from injury will improve Outcome: Progressing   Problem: Skin Integrity: Goal: Risk for impaired skin integrity will decrease Outcome: Progressing   Problem: Education: Goal: Ability to describe self-care measures that may prevent or decrease complications (Diabetes Survival Skills Education) will improve Outcome: Progressing Goal: Individualized Educational Video(s) Outcome: Progressing   Problem: Cardiac: Goal: Ability to maintain an adequate cardiac output will improve Outcome: Progressing   Problem: Health Behavior/Discharge Planning: Goal: Ability to identify and utilize available resources and services will improve Outcome: Progressing Goal: Ability to manage health-related needs will improve Outcome: Progressing   Problem: Fluid Volume: Goal: Ability to achieve a balanced intake and output will improve Outcome: Progressing   Problem: Metabolic: Goal: Ability to maintain appropriate glucose levels will improve Outcome: Progressing   Problem: Nutritional: Goal: Maintenance of adequate nutrition will improve Outcome: Progressing Goal: Maintenance of adequate weight for body size and type will improve Outcome: Progressing   Problem: Respiratory: Goal: Will regain and/or maintain adequate ventilation Outcome: Progressing    Problem: Urinary Elimination: Goal: Ability to achieve and maintain adequate renal perfusion and functioning will improve Outcome: Progressing   Problem: Education: Goal: Ability to describe self-care measures that may prevent or decrease complications (Diabetes Survival Skills Education) will improve Outcome: Progressing   Problem: Education: Goal: Individualized Educational Video(s) Outcome: Progressing   Problem: Coping: Goal: Ability to adjust to condition or change in health will improve Outcome: Progressing   Problem: Fluid Volume: Goal: Ability to maintain a balanced intake and output will improve Outcome: Progressing  Problem: Health Behavior/Discharge Planning: Goal: Ability to identify and utilize available resources and services will improve Outcome: Progressing   Problem: Metabolic: Goal: Ability to maintain appropriate glucose levels will improve Outcome: Progressing   Problem: Nutritional: Goal: Maintenance of adequate nutrition will improve Outcome: Progressing   Problem: Nutritional: Goal: Progress toward achieving an optimal weight will improve Outcome: Progressing   Problem: Skin Integrity: Goal: Risk for impaired skin integrity will decrease Outcome: Progressing   Problem: Tissue Perfusion: Goal: Adequacy of tissue perfusion will improve Outcome: Progressing   Problem: Clinical Measurements: Goal: Ability to maintain clinical measurements within normal limits will improve Outcome: Progressing   Problem: Education: Goal: Knowledge of General Education information will improve Description: Including pain rating scale, medication(s)/side effects and non-pharmacologic comfort measures Outcome: Progressing   Problem: Health Behavior/Discharge Planning: Goal: Ability to manage health-related needs will improve Outcome: Progressing   Problem: Clinical Measurements: Goal: Diagnostic test results will improve Outcome: Progressing   Problem:  Clinical Measurements: Goal: Respiratory complications will improve Outcome: Progressing   Problem: Clinical Measurements: Goal: Cardiovascular complication will be avoided Outcome: Progressing   Problem: Coping: Goal: Level of anxiety will decrease Outcome: Progressing   Problem: Elimination: Goal: Will not experience complications related to bowel motility Outcome: Progressing   Problem: Pain Managment: Goal: General experience of comfort will improve Outcome: Progressing   Problem: Elimination: Goal: Will not experience complications related to urinary retention Outcome: Progressing   Problem: Education: Goal: Individualized Educational Video(s) Outcome: Progressing   Problem: Cardiac: Goal: Ability to maintain an adequate cardiac output will improve Outcome: Progressing   Problem: Skin Integrity: Goal: Risk for impaired skin integrity will decrease Outcome: Progressing   Problem: Nutritional: Goal: Maintenance of adequate weight for body size and type will improve Outcome: Progressing   Problem: Nutritional: Goal: Maintenance of adequate nutrition will improve Outcome: Progressing   Problem: Respiratory: Goal: Will regain and/or maintain adequate ventilation Outcome: Progressing   Problem: Urinary Elimination: Goal: Ability to achieve and maintain adequate renal perfusion and functioning will improve Outcome: Progressing

## 2023-01-25 DIAGNOSIS — E870 Hyperosmolality and hypernatremia: Secondary | ICD-10-CM | POA: Diagnosis not present

## 2023-01-25 DIAGNOSIS — N179 Acute kidney failure, unspecified: Secondary | ICD-10-CM | POA: Diagnosis not present

## 2023-01-25 DIAGNOSIS — I480 Paroxysmal atrial fibrillation: Secondary | ICD-10-CM | POA: Insufficient documentation

## 2023-01-25 DIAGNOSIS — A419 Sepsis, unspecified organism: Secondary | ICD-10-CM | POA: Diagnosis not present

## 2023-01-25 DIAGNOSIS — G9341 Metabolic encephalopathy: Secondary | ICD-10-CM | POA: Diagnosis not present

## 2023-01-25 DIAGNOSIS — N390 Urinary tract infection, site not specified: Secondary | ICD-10-CM | POA: Diagnosis not present

## 2023-01-25 LAB — GLUCOSE, CAPILLARY
Glucose-Capillary: 144 mg/dL — ABNORMAL HIGH (ref 70–99)
Glucose-Capillary: 144 mg/dL — ABNORMAL HIGH (ref 70–99)
Glucose-Capillary: 147 mg/dL — ABNORMAL HIGH (ref 70–99)
Glucose-Capillary: 155 mg/dL — ABNORMAL HIGH (ref 70–99)
Glucose-Capillary: 157 mg/dL — ABNORMAL HIGH (ref 70–99)
Glucose-Capillary: 157 mg/dL — ABNORMAL HIGH (ref 70–99)
Glucose-Capillary: 158 mg/dL — ABNORMAL HIGH (ref 70–99)
Glucose-Capillary: 162 mg/dL — ABNORMAL HIGH (ref 70–99)
Glucose-Capillary: 164 mg/dL — ABNORMAL HIGH (ref 70–99)
Glucose-Capillary: 166 mg/dL — ABNORMAL HIGH (ref 70–99)
Glucose-Capillary: 167 mg/dL — ABNORMAL HIGH (ref 70–99)
Glucose-Capillary: 167 mg/dL — ABNORMAL HIGH (ref 70–99)
Glucose-Capillary: 168 mg/dL — ABNORMAL HIGH (ref 70–99)
Glucose-Capillary: 169 mg/dL — ABNORMAL HIGH (ref 70–99)
Glucose-Capillary: 173 mg/dL — ABNORMAL HIGH (ref 70–99)
Glucose-Capillary: 175 mg/dL — ABNORMAL HIGH (ref 70–99)
Glucose-Capillary: 176 mg/dL — ABNORMAL HIGH (ref 70–99)
Glucose-Capillary: 176 mg/dL — ABNORMAL HIGH (ref 70–99)
Glucose-Capillary: 176 mg/dL — ABNORMAL HIGH (ref 70–99)
Glucose-Capillary: 190 mg/dL — ABNORMAL HIGH (ref 70–99)
Glucose-Capillary: 194 mg/dL — ABNORMAL HIGH (ref 70–99)
Glucose-Capillary: 198 mg/dL — ABNORMAL HIGH (ref 70–99)
Glucose-Capillary: 206 mg/dL — ABNORMAL HIGH (ref 70–99)

## 2023-01-25 LAB — BASIC METABOLIC PANEL
Anion gap: 10 (ref 5–15)
BUN: 19 mg/dL (ref 8–23)
BUN: 17 mg/dL (ref 8–23)
BUN: 15 mg/dL (ref 8–23)
BUN: 14 mg/dL (ref 8–23)
CO2: 19 mmol/L — ABNORMAL LOW (ref 22–32)
CO2: 21 mmol/L — ABNORMAL LOW (ref 22–32)
CO2: 17 mmol/L — ABNORMAL LOW (ref 22–32)
CO2: 19 mmol/L — ABNORMAL LOW (ref 22–32)
Calcium: 10.9 mg/dL — ABNORMAL HIGH (ref 8.9–10.3)
Calcium: 11.1 mg/dL — ABNORMAL HIGH (ref 8.9–10.3)
Calcium: 10.4 mg/dL — ABNORMAL HIGH (ref 8.9–10.3)
Calcium: 10.4 mg/dL — ABNORMAL HIGH (ref 8.9–10.3)
Chloride: >130 mmol/L (ref 98–111)
Chloride: >130 mmol/L (ref 98–111)
Chloride: >130 mmol/L (ref 98–111)
Chloride: 127 mmol/L — ABNORMAL HIGH (ref 98–111)
Creatinine, Ser: 1.11 mg/dL — ABNORMAL HIGH (ref 0.44–1.00)
Creatinine, Ser: 1.23 mg/dL — ABNORMAL HIGH (ref 0.44–1.00)
Creatinine, Ser: 1.02 mg/dL — ABNORMAL HIGH (ref 0.44–1.00)
Creatinine, Ser: 0.94 mg/dL (ref 0.44–1.00)
GFR, Estimated: 52 mL/min — ABNORMAL LOW (ref >60)
GFR, Estimated: 46 mL/min — ABNORMAL LOW (ref >60)
GFR, Estimated: 57 mL/min — ABNORMAL LOW (ref >60)
GFR, Estimated: >60 mL/min (ref >60)
Glucose, Bld: 190 mg/dL — ABNORMAL HIGH (ref 70–99)
Glucose, Bld: 166 mg/dL — ABNORMAL HIGH (ref 70–99)
Glucose, Bld: 242 mg/dL — ABNORMAL HIGH (ref 70–99)
Glucose, Bld: 200 mg/dL — ABNORMAL HIGH (ref 70–99)
Potassium: 3.1 mmol/L — ABNORMAL LOW (ref 3.5–5.1)
Potassium: 3.3 mmol/L — ABNORMAL LOW (ref 3.5–5.1)
Potassium: 3.4 mmol/L — ABNORMAL LOW (ref 3.5–5.1)
Potassium: 3.6 mmol/L (ref 3.5–5.1)
Sodium: 161 mmol/L (ref 135–145)
Sodium: 165 mmol/L (ref 135–145)
Sodium: 159 mmol/L — ABNORMAL HIGH (ref 135–145)
Sodium: 156 mmol/L — ABNORMAL HIGH (ref 135–145)

## 2023-01-25 LAB — MAGNESIUM
Magnesium: 1.6 mg/dL — ABNORMAL LOW (ref 1.7–2.4)
Magnesium: 1.6 mg/dL — ABNORMAL LOW (ref 1.7–2.4)

## 2023-01-25 LAB — BLOOD GAS, VENOUS
Acid-base deficit: 1.5 mmol/L (ref 0.0–2.0)
Bicarbonate: 23.7 mmol/L (ref 20.0–28.0)
Drawn by: 59174
O2 Saturation: 31.1 %
Patient temperature: 37.3
pCO2, Ven: 42 mmHg — ABNORMAL LOW (ref 44–60)
pH, Ven: 7.37 (ref 7.25–7.43)
pO2, Ven: <31 mmHg — CL (ref 32–45)

## 2023-01-25 LAB — HEPARIN LEVEL (UNFRACTIONATED): Heparin Unfractionated: 0.41 [IU]/mL (ref 0.30–0.70)

## 2023-01-25 LAB — CBC
HCT: 38.1 % (ref 36.0–46.0)
Hemoglobin: 11.2 g/dL — ABNORMAL LOW (ref 12.0–15.0)
MCH: 24.7 pg — ABNORMAL LOW (ref 26.0–34.0)
MCHC: 29.4 g/dL — ABNORMAL LOW (ref 30.0–36.0)
MCV: 84.1 fL (ref 80.0–100.0)
Platelets: 209 10*3/uL (ref 150–400)
RBC: 4.53 MIL/uL (ref 3.87–5.11)
RDW: 16.6 % — ABNORMAL HIGH (ref 11.5–15.5)
WBC: 10.1 10*3/uL (ref 4.0–10.5)
nRBC: 0.2 % (ref 0.0–0.2)

## 2023-01-25 LAB — BETA-HYDROXYBUTYRIC ACID: Beta-Hydroxybutyric Acid: 0.43 mmol/L — ABNORMAL HIGH (ref 0.05–0.27)

## 2023-01-25 MED ORDER — DILTIAZEM LOAD VIA INFUSION
15.0000 mg | Freq: Once | INTRAVENOUS | Status: AC
  Administered 2023-01-25: 15 mg via INTRAVENOUS
  Filled 2023-01-25: qty 15

## 2023-01-25 MED ORDER — ENOXAPARIN SODIUM 40 MG/0.4ML IJ SOSY: 40.0000 mg | PREFILLED_SYRINGE | INTRAMUSCULAR | Status: DC

## 2023-01-25 MED ORDER — DILTIAZEM HCL-DEXTROSE 125-5 MG/125ML-% IV SOLN (PREMIX)
5.0000 mg/h | INTRAVENOUS | Status: DC
  Administered 2023-01-25: 5 mg/h via INTRAVENOUS
  Administered 2023-01-25: 20 mg/h via INTRAVENOUS
  Administered 2023-01-25 – 2023-01-26 (×2): 10 mg/h via INTRAVENOUS
  Administered 2023-01-27 – 2023-01-29 (×3): 5 mg/h via INTRAVENOUS
  Filled 2023-01-25 (×8): qty 125

## 2023-01-25 MED ORDER — METOPROLOL TARTRATE 5 MG/5ML IV SOLN
5.0000 mg | Freq: Once | INTRAVENOUS | Status: AC
  Administered 2023-01-25: 5 mg via INTRAVENOUS

## 2023-01-25 MED ORDER — POTASSIUM CHLORIDE 10 MEQ/100ML IV SOLN
10.0000 meq | INTRAVENOUS | Status: AC
  Administered 2023-01-25 (×2): 10 meq via INTRAVENOUS
  Filled 2023-01-25 (×2): qty 100

## 2023-01-25 MED ORDER — MAGNESIUM SULFATE 2 GM/50ML IV SOLN
2.0000 g | Freq: Once | INTRAVENOUS | Status: AC
  Administered 2023-01-26: 2 g via INTRAVENOUS
  Filled 2023-01-25: qty 50

## 2023-01-25 MED ORDER — POTASSIUM CHLORIDE 10 MEQ/100ML IV SOLN
10.0000 meq | INTRAVENOUS | Status: AC
  Administered 2023-01-25 (×4): 10 meq via INTRAVENOUS
  Filled 2023-01-25 (×4): qty 100

## 2023-01-25 MED ORDER — HEPARIN BOLUS VIA INFUSION
3000.0000 [IU] | Freq: Once | INTRAVENOUS | Status: AC
  Administered 2023-01-25: 3000 [IU] via INTRAVENOUS
  Filled 2023-01-25: qty 3000

## 2023-01-25 MED ORDER — HEPARIN (PORCINE) 25000 UT/250ML-% IV SOLN
950.0000 [IU]/h | INTRAVENOUS | Status: DC
  Administered 2023-01-25: 850 [IU]/h via INTRAVENOUS
  Administered 2023-01-26: 950 [IU]/h via INTRAVENOUS
  Filled 2023-01-25 (×2): qty 250

## 2023-01-25 MED ORDER — DEXTROSE 5 % IV SOLN: INTRAVENOUS | Status: DC

## 2023-01-25 NOTE — Progress Notes (Signed)
PT Cancellation Note  Patient Details Name: Samantha [name] MRN: [id number] DOB: 08-08-1945   Cancelled Treatment:    Reason Eval/Treat Not Completed: Patient's level of consciousness  Patient unable to stay awake despite max cues and tactile stimulation (including PROM of UEs).    Jerolyn Center, PT Acute Rehabilitation Services  Office [phone]  Zena Amos 01/25/2023, 3:48 PM

## 2023-01-25 NOTE — Progress Notes (Signed)
PROGRESS NOTE        PATIENT DETAILS Name: Samantha [name] Age: 77 y.o. Sex: female Date of Birth: [birth year] Admit Date: 01/23/2023 Admitting Physician Lurline Del, MD [id number], Devonne Doughty, NP  Brief Summary: Patient is a 78 y.o.  female with history of DM-2, HTN, hyperthyroidism-s/p RAI radioactive iodine ablation 06/24-who was apparently diagnosed with a UTI-initially on Bactrim-then changed to Augmentin-presented with altered mental status.  Patient was found to have AKI/metabolic acidosis and hypernatremia-and subsequently admitted to the hospitalist service.  Significant events: 8/13>> admit to [facility] 8/14>> started insulin GTT for euglycemic DKA 8/15>> A-fib RVR-no response to IV Lopressor, started Cardizem infusion-cardiology consult.  Significant studies: 8/13>> CT head: No acute intracranial abnormality 8/13>> CXR: No obvious PNA 8/13>> renal ultrasound: No hydronephrosis 8/15>> echo: EF> 75%.  Significant microbiology data: 7/25>> urine culture: Klebsiella 8/13>> COVID PCR: Negative 8/13>> blood cultures: Negative 8/14>> urine culture: Negative  Procedures: None  Consults: None  Subjective: More lethargic-but awakes-moves all 4 extremities-mumbles.  Yesterday evening when grandson was bedside-she was much more awake and alert.  Objective: Vitals: Blood pressure (!) 143/67, pulse (!) 118, temperature 99.1 F (37.3 C), temperature source Axillary, resp. rate 20, height 5\' 1"  (1.549 m), weight 63.6 kg, SpO2 98%.   Exam: Gen Exam: Confused but not in any distress HEENT:atraumatic, normocephalic Chest: B/L clear to auscultation anteriorly CVS:S1S2 -tachycardic-irregular Abdomen:soft non tender, non distended Extremities:no edema Neurology: Difficult exam but moves all 4 extremities. Skin: no rash  Pertinent Labs/Radiology:    Latest Ref Rng & Units 01/25/2023    8:16 AM 01/24/2023    1:42 AM 01/23/2023    6:51 PM  CBC  WBC 4.0 -  10.5 K/uL 10.1  12.9  12.9   Hemoglobin 12.0 - 15.0 g/dL 44.0  34.7  42.5   Hematocrit 36.0 - 46.0 % 38.1  38.5  42.3   Platelets 150 - 400 K/uL 209  295  241     Lab Results  Component Value Date   NA 161 (HH) 01/25/2023   K 3.1 (L) 01/25/2023   CL >130 (HH) 01/25/2023   CO2 19 (L) 01/25/2023      Assessment/Plan: Acute metabolic encephalopathy Likely due to hypernatremia/AKI/possible euglycemic DKA CT head was negative AKI/euglycemic DKA have essentially resolved-with IV insulin-unfortunately patient appears to have persistent hypernatremia-and hence continues to remain encephalopathic-will continue to treat with D5W (see discussion below).  Hopefully mentation will improve after underlying etiologies are treated and have improved.  If not-we will initiate further workup.  Paroxysmal atrial fibrillation with RVR Occurred on 8/15-rates in the 180s-190s at times No response to IV beta-blocker-on Cardizem infusion CHADS2 Vasc score of around 5-IV heparin Recent echo stable. Cardiology now following.  AKI Likely hemodynamically mediated (poor oral intake and lisinopril use), euglycemic DKA and recent Bactrim use Creatinine improved after treatment of underlying etiologies and euglycemic DKA.    Hypernatremia Secondary to poor oral intake in the setting of recent UTI Unfortunately-no improvement after starting half-normal saline yesterday Chart reviewed with nephrologist-Dr. Lin-suspects that patient has a significant free water deficit-recommends increasing D5W (started earlier this morning) 250 cc-will repeat labs this afternoon.  Anion gap metabolic acidosis likely due to euglycemic DKA from Jardiance use resolved after insulin infusion Will continue IV insulin infusion-as patient remains confused-and is getting a fair amount of D5W infusion for hypernatremia-and will be  at risk for hyperglycemia. Once sodium levels are corrected-mentation improved-we will switch to SQ  insulin.  Hypercalcemia Mild Suspect this is mostly due to dehydration/hemoconcentration Hydrating with IVF-and following labs.  Recent complicated UTI with Klebsiella pneumoniae Initially on Bactrim-then switched to Augmentin (as an outpatient) Doubt sepsis physiology-do not think it was present on admission Suspect altered mental status is from AKI/hypernatremia/euglycemic DKA rather than sepsis physiology In the urine cultures are negative (was on antibiotics prior to this hospitalization)-will continue with IV antibiotics for now given clinical situation.  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 S/p recent radioactive iodine ablation on 6/18 TSH still suppressed Follow-up with endocrine as an outpatient  HTN BP stable-continue metoprolol Metformin on hold  DM-2 (A1c 9.8 on 8/13) SSI  Recent Labs    01/25/23 0629 01/25/23 0730 01/25/23 0858  GLUCAP 162* 167* 155*     Nutrition Status: Nutrition Problem: Moderate Malnutrition Etiology: chronic illness Signs/Symptoms: percent weight loss, mild fat depletion, moderate muscle depletion Percent weight loss: 22 % Interventions: Ensure Enlive (each supplement provides 350kcal and 20 grams of protein), MVI, Liberalize Diet    BMI: Estimated body mass index is 26.49 kg/m as calculated from the following:   Height as of this encounter: 5\' 1"  (1.549 m).   Weight as of this encounter: 63.6 kg.   Code status:   Code Status: Full Code   DVT Prophylaxis: heparin bolus via infusion 3,000 Units Start: 01/25/23 1045 SCDs Start: 01/25/23 0805   Family Communication: Daughter at bedside   Disposition Plan: Status is: Inpatient Remains inpatient appropriate because: Severity of illness   Planned Discharge Destination:Home health   Diet: Diet Order             Diet NPO time specified  Diet effective now                     Antimicrobial agents: Anti-infectives (From admission, onward)     Start     Dose/Rate Route Frequency Ordered Stop   01/24/23 1500  cefTRIAXone (ROCEPHIN) 1 g in sodium chloride 0.9 % 100 mL IVPB        1 g 200 mL/hr over 30 Minutes Intravenous Every 24 hours 01/23/23 1836 01/30/23 1459   01/23/23 1430  cefTRIAXone (ROCEPHIN) 2 g in sodium chloride 0.9 % 100 mL IVPB  Status:  Discontinued        2 g 200 mL/hr over 30 Minutes Intravenous Every 24 hours 01/23/23 1417 01/23/23 1835        MEDICATIONS: Scheduled Meds:  aspirin EC  81 mg Oral Daily   feeding supplement  237 mL Oral BID BM   heparin  3,000 Units Intravenous Once   insulin aspart  2 Units Subcutaneous TID WC   metoprolol tartrate  10 mg Intravenous Q6H   multivitamin with minerals  1 tablet Oral Daily   Continuous Infusions:  cefTRIAXone (ROCEPHIN)  IV 1 g (01/24/23 1505)   dextrose 75 mL/hr at 01/25/23 0835   diltiazem (CARDIZEM) infusion 15 mg/hr (01/25/23 0920)   heparin     insulin 1.2 Units/hr (01/25/23 0859)   lactated ringers     PRN Meds:.acetaminophen **OR** acetaminophen, albuterol, dextrose, melatonin, ondansetron **OR** ondansetron (ZOFRAN) IV   I have personally reviewed following labs and imaging studies  LABORATORY DATA: CBC: Recent Labs  Lab 01/19/23 1527 01/23/23 1319 01/23/23 1851 01/24/23 0142 01/25/23 0816  WBC 8.7 11.7* 12.9* 12.9* 10.1  NEUTROABS  --  9.8*  --   --   --  HGB 11.8* 13.2 12.2 11.2* 11.2*  HCT 39.2 45.4 42.3 38.5 38.1  MCV 84.3 85.0 84.9 85.2 84.1  PLT 472* 397 241 295 209    Basic Metabolic Panel: Recent Labs  Lab 01/24/23 0142 01/24/23 1433 01/24/23 1756 01/24/23 2209 01/25/23 0218  NA 160* 163* 163* 164* 161*  K 3.7 3.3* 3.5 3.3* 3.1*  CL 128* >130* >130* >130* >130*  CO2 13* 10* 16* 15* 19*  GLUCOSE 174* 137* 172* 176* 190*  BUN 31* 25* 21 19 19   CREATININE 1.69* 1.48* 1.31* 1.28* 1.11*  CALCIUM 11.0* 11.1* 10.5* 11.2* 10.9*    GFR: Estimated Creatinine Clearance: 36.8 mL/min (A) (by C-G formula based on SCr of  1.11 mg/dL (H)).  Liver Function Tests: Recent Labs  Lab 01/19/23 1527 01/23/23 1319 01/24/23 0142  AST 18 23 17   ALT 18 20 17   ALKPHOS 99 88 70  BILITOT 0.5 1.0 0.9  PROT 7.3 7.7 6.3*  ALBUMIN 2.9* 3.3* 2.6*   Recent Labs  Lab 01/19/23 1527 01/23/23 1319  LIPASE 106* 338*   No results for input(s): "AMMONIA" in the last 168 hours.  Coagulation Profile: No results for input(s): "INR", "PROTIME" in the last 168 hours.  Cardiac Enzymes: No results for input(s): "CKTOTAL", "CKMB", "CKMBINDEX", "TROPONINI" in the last 168 hours.  BNP (last 3 results) No results for input(s): "PROBNP" in the last 8760 hours.  Lipid Profile: No results for input(s): "CHOL", "HDL", "LDLCALC", "TRIG", "CHOLHDL", "LDLDIRECT" in the last 72 hours.  Thyroid Function Tests: Recent Labs    01/23/23 1319 01/23/23 1851  TSH 0.028*  --   T4TOTAL 17.5*  --   FREET4  --  4.47*  T3FREE 7.7*  --     Anemia Panel: No results for input(s): "VITAMINB12", "FOLATE", "FERRITIN", "TIBC", "IRON", "RETICCTPCT" in the last 72 hours.  Urine analysis:    Component Value Date/Time   COLORURINE YELLOW 01/24/2023 0336   APPEARANCEUR HAZY (A) 01/24/2023 0336   LABSPEC 1.018 01/24/2023 0336   PHURINE 5.0 01/24/2023 0336   GLUCOSEU >=500 (A) 01/24/2023 0336   HGBUR SMALL (A) 01/24/2023 0336   HGBUR negative 11/11/2007 1104   BILIRUBINUR NEGATIVE 01/24/2023 0336   KETONESUR 20 (A) 01/24/2023 0336   PROTEINUR NEGATIVE 01/24/2023 0336   UROBILINOGEN 0.2 11/18/2014 2105   NITRITE NEGATIVE 01/24/2023 0336   LEUKOCYTESUR NEGATIVE 01/24/2023 0336    Sepsis Labs: Lactic Acid, Venous    Component Value Date/Time   LATICACIDVEN 1.9 01/23/2023 1716    MICROBIOLOGY: Recent Results (from the past 240 hour(s))  Resp panel by RT-PCR (RSV, Flu A&B, Covid) Urine, Clean Catch     Status: None   Collection Time: 01/19/23  8:02 PM   Specimen: Urine, Clean Catch; Nasal Swab  Result Value Ref Range Status   SARS  Coronavirus 2 by RT PCR NEGATIVE NEGATIVE Final   Influenza A by PCR NEGATIVE NEGATIVE Final   Influenza B by PCR NEGATIVE NEGATIVE Final    Comment: (NOTE) The Xpert Xpress SARS-CoV-2/FLU/RSV plus assay is intended as an aid in the diagnosis of influenza from Nasopharyngeal swab specimens and should not be used as a sole basis for treatment. Nasal washings and aspirates are unacceptable for Xpert Xpress SARS-CoV-2/FLU/RSV testing.  Fact Sheet for Patients: BloggerCourse.com  Fact Sheet for Healthcare Providers: SeriousBroker.it  This test is not yet approved or cleared by the [place] FDA and has been authorized for detection and/or diagnosis of SARS-CoV-2 by FDA under an Emergency Use Authorization (EUA). This EUA  will remain in effect (meaning this test can be used) for the duration of the COVID-19 declaration under Section 564(b)(1) of the Act, 21 U.S.C. section 360bbb-3(b)(1), unless the authorization is terminated or revoked.     Resp Syncytial Virus by PCR NEGATIVE NEGATIVE Final    Comment: (NOTE) Fact Sheet for Patients: BloggerCourse.com  Fact Sheet for Healthcare Providers: SeriousBroker.it  This test is not yet approved or cleared by the [place] FDA and has been authorized for detection and/or diagnosis of SARS-CoV-2 by FDA under an Emergency Use Authorization (EUA). This EUA will remain in effect (meaning this test can be used) for the duration of the COVID-19 declaration under Section 564(b)(1) of the Act, 21 U.S.C. section 360bbb-3(b)(1), unless the authorization is terminated or revoked.  Performed at [facility], [address], [place], [place] [address]   Culture, blood (routine x 2)     Status: None (Preliminary result)   Collection Time: 01/23/23  1:00 PM   Specimen: BLOOD  Result Value Ref Range Status   Specimen Description BLOOD  RIGHT ANTECUBITAL  Final   Special Requests   Final    BOTTLES DRAWN AEROBIC AND ANAEROBIC Blood Culture results may not be optimal due to an inadequate volume of blood received in culture bottles   Culture   Final    NO GROWTH 2 DAYS Performed at [facility], [address] [place], [place] [address]    Report Status PENDING  Incomplete  Culture, blood (routine x 2)     Status: None (Preliminary result)   Collection Time: 01/23/23  1:19 PM   Specimen: BLOOD  Result Value Ref Range Status   Specimen Description BLOOD BLOOD LEFT ARM  Final   Special Requests   Final    BOTTLES DRAWN AEROBIC AND ANAEROBIC Blood Culture adequate volume   Culture   Final    NO GROWTH 2 DAYS Performed at [facility], [address], [place], [place] [address]    Report Status PENDING  Incomplete  SARS Coronavirus 2 by RT PCR (hospital order, performed in [facility] hospital lab) *cepheid single result test* Anterior Nasal Swab     Status: None   Collection Time: 01/23/23  1:19 PM   Specimen: Anterior Nasal Swab  Result Value Ref Range Status   SARS Coronavirus 2 by RT PCR NEGATIVE NEGATIVE Final    Comment: Performed at [facility], [address], [place], [place] [address]  Urine Culture     Status: None   Collection Time: 01/24/23  3:39 AM   Specimen: Urine, Clean Catch  Result Value Ref Range Status   Specimen Description URINE, CLEAN CATCH  Final   Special Requests NONE  Final   Culture   Final    NO GROWTH Performed at [facility], [address], [place], [place] [address]    Report Status 01/25/2023 FINAL  Final    RADIOLOGY STUDIES/RESULTS: ECHOCARDIOGRAM COMPLETE  Result Date: 01/24/2023    ECHOCARDIOGRAM REPORT   Patient Name:   Samantha [name] Date of Exam: 01/24/2023 Medical Rec #:  [id number]   Height:       61.0 in Accession #:    [id number]  Weight:       140.2 lb Date of Birth:  May 18, 1946   BSA:          1.624 m Patient Age:    77 years    BP:  142/79 mmHg Patient Gender: F           HR:           122 bpm. Exam Location:  Inpatient Procedure: 2D Echo, Cardiac Doppler and Color Doppler Indications:    Elevated troponin  History:        Patient has no prior history of Echocardiogram examinations.                 CKD, Signs/Symptoms:Fatigue; Risk Factors:Dyslipidemia,                 Hypertension and Diabetes. Sepsis.  Sonographer:    Wallie Char Referring Phys: Skip Mayer, A IMPRESSIONS  1. Elevated LVOT peak velocity of 4 m/s likely due to hyperdynamic LV function and chordal SAM.  2. Left ventricular ejection fraction, by estimation, is >75%. The left ventricle has hyperdynamic function. The left ventricle has no regional wall motion abnormalities. There is mild concentric left ventricular hypertrophy. Left ventricular diastolic parameters are consistent with Grade I diastolic dysfunction (impaired relaxation).  3. Right ventricular systolic function is normal. The right ventricular size is normal.  4. The mitral valve is normal in structure. No evidence of mitral valve regurgitation. No evidence of mitral stenosis.  5. The aortic valve is normal in structure. Aortic valve regurgitation is not visualized. No aortic stenosis is present.  6. The inferior vena cava is dilated in size with >50% respiratory variability, suggesting right atrial pressure of 8 mmHg. Comparison(s): No prior Echocardiogram. FINDINGS  Left Ventricle: Left ventricular ejection fraction, by estimation, is >75%. The left ventricle has hyperdynamic function. The left ventricle has no regional wall motion abnormalities. The left ventricular internal cavity size was normal in size. There is mild concentric left ventricular hypertrophy. Left ventricular diastolic parameters are consistent with Grade I diastolic dysfunction (impaired relaxation). Right Ventricle: The right ventricular size is normal. Right ventricular systolic function is normal. Left Atrium: Left atrial size was  normal in size. Right Atrium: Right atrial size was normal in size. Pericardium: Trivial pericardial effusion is present. Mitral Valve: The mitral valve is normal in structure. No evidence of mitral valve regurgitation. No evidence of mitral valve stenosis. MV peak gradient, 6.2 mmHg. The mean mitral valve gradient is 3.0 mmHg. Tricuspid Valve: The tricuspid valve is normal in structure. Tricuspid valve regurgitation is mild . No evidence of tricuspid stenosis. Aortic Valve: The aortic valve is normal in structure. Aortic valve regurgitation is not visualized. No aortic stenosis is present. Aortic valve mean gradient measures 10.0 mmHg. Aortic valve peak gradient measures 17.3 mmHg. Aortic valve area, by VTI measures 1.57 cm. Pulmonic Valve: The pulmonic valve was normal in structure. Pulmonic valve regurgitation is not visualized. No evidence of pulmonic stenosis. Aorta: The aortic root is normal in size and structure. Venous: The inferior vena cava is dilated in size with greater than 50% respiratory variability, suggesting right atrial pressure of 8 mmHg. IAS/Shunts: No atrial level shunt detected by color flow Doppler. Additional Comments: Elevated LVOT peak velocity of 4 m/s likely due to hyperdynamic LV function and chordal SAM.  LEFT VENTRICLE PLAX 2D LVIDd:         3.50 cm     Diastology LVIDs:         2.00 cm     LV e' medial:    6.81 cm/s LV PW:         0.90 cm     LV E/e' medial:  13.4 LV IVS:  0.70 cm     LV e' lateral:   6.73 cm/s LVOT diam:     1.50 cm     LV E/e' lateral: 13.6 LV SV:         45 LV SV Index:   28 LVOT Area:     1.77 cm  LV Volumes (MOD) LV vol d, MOD A2C: 42.3 ml LV vol d, MOD A4C: 57.2 ml LV vol s, MOD A2C: 11.1 ml LV vol s, MOD A4C: 12.6 ml LV SV MOD A2C:     31.2 ml LV SV MOD A4C:     57.2 ml LV SV MOD BP:      37.5 ml RIGHT VENTRICLE             IVC RV Basal diam:  2.90 cm     IVC diam: 2.30 cm RV S prime:     21.00 cm/s TAPSE (M-mode): 2.1 cm LEFT ATRIUM             Index         RIGHT ATRIUM          Index LA diam:        3.20 cm 1.97 cm/m   RA Area:     8.47 cm LA Vol (A2C):   36.6 ml 22.54 ml/m  RA Volume:   15.80 ml 9.73 ml/m LA Vol (A4C):   37.9 ml 23.34 ml/m LA Biplane Vol: 39.4 ml 24.26 ml/m  AORTIC VALVE AV Area (Vmax):    1.60 cm AV Area (Vmean):   1.55 cm AV Area (VTI):     1.57 cm AV Vmax:           208.00 cm/s AV Vmean:          145.500 cm/s AV VTI:            0.288 m AV Peak Grad:      17.3 mmHg AV Mean Grad:      10.0 mmHg LVOT Vmax:         188.00 cm/s LVOT Vmean:        127.500 cm/s LVOT VTI:          0.256 m LVOT/AV VTI ratio: 0.89 MITRAL VALVE                TRICUSPID VALVE MV Area (PHT): 3.89 cm     TR Peak grad:   36.5 mmHg MV Area VTI:   2.18 cm     TR Vmax:        302.00 cm/s MV Peak grad:  6.2 mmHg MV Mean grad:  3.0 mmHg     SHUNTS MV Vmax:       1.24 m/s     Systemic VTI:  0.26 m MV Vmean:      74.9 cm/s    Systemic Diam: 1.50 cm MV Decel Time: 195 msec MV E velocity: 91.30 cm/s MV A velocity: 136.00 cm/s MV E/A ratio:  0.67 Olga Millers MD Electronically signed by Olga Millers MD Signature Date/Time: 01/24/2023/10:45:00 AM    Final    US RENAL  Result Date: 01/23/2023 CLINICAL DATA:  Urinary tract infection. EXAM: RENAL / URINARY TRACT ULTRASOUND COMPLETE COMPARISON:  Ultrasound dated 12/02/2020. FINDINGS: Right Kidney: Renal measurements: 8.2 x 4.3 x 4.5 cm = volume: 83 mL. Normal echogenicity. No hydronephrosis or shadowing stone. Left Kidney: Renal measurements: 9.2 x 5.1 x 5.7 cm = volume: 139 mL. Normal echogenicity. No hydronephrosis or shadowing stone. Bladder: Appears normal for  degree of bladder distention. Other: None. IMPRESSION: Unremarkable renal ultrasound. Electronically Signed   By: Elgie Collard M.D.   On: 01/23/2023 20:20   DG CHEST PORT 1 VIEW  Result Date: 01/23/2023 CLINICAL DATA:  Cough. Altered mental status. Treatment for urinary tract infection for 4 days. EXAM: PORTABLE CHEST 1 VIEW COMPARISON:  CT 01/19/2023  FINDINGS: Shallow inspiration. Linear atelectasis or fibrosis in both lung bases. No developing consolidation or edema. Normal heart size. Mediastinal contours appear intact. No pleural effusion or pneumothorax. Degenerative changes in the spine and shoulders. IMPRESSION: Shallow inspiration with linear fibrosis or atelectasis in the lung bases. Electronically Signed   By: Burman Nieves M.D.   On: 01/23/2023 17:53   CT Head Wo Contrast  Result Date: 01/23/2023 CLINICAL DATA:  Mental status change, unknown cause EXAM: CT HEAD WITHOUT CONTRAST TECHNIQUE: Contiguous axial images were obtained from the base of the skull through the vertex without intravenous contrast. RADIATION DOSE REDUCTION: This exam was performed according to the departmental dose-optimization program which includes automated exposure control, adjustment of the mA and/or kV according to patient size and/or use of iterative reconstruction technique. COMPARISON:  CT Head 11/14/11 FINDINGS: Brain: No evidence of acute infarction, hemorrhage, hydrocephalus, extra-axial collection or mass lesion/mass effect. Vascular: No hyperdense vessel or unexpected calcification. Skull: Normal. Negative for fracture or focal lesion. Sinuses/Orbits: No middle ear or mastoid effusion. Paranasal sinuses are clear. Bilateral lens replacement. Orbits are otherwise unremarkable. Other: None. IMPRESSION: No acute intracranial abnormality. Electronically Signed   By: Lorenza Cambridge M.D.   On: 01/23/2023 16:14     LOS: 2 days   Jeoffrey Massed, MD  [facility]    To contact the attending provider between 7A-7P or the covering provider during after hours 7P-7A, please log into the web site [email or web address] and access using universal [place] password for that web site. If you do not have the password, please call the hospital operator.  01/25/2023, 9:49 AM

## 2023-01-25 NOTE — Progress Notes (Signed)
PT Cancellation Note  Patient Details Name: Samantha [name] MRN: [id number] DOB: 01/29/1946   Cancelled Treatment:    Reason Eval/Treat Not Completed: Patient not medically ready  Resting HR 180s. PT deferred at this time. Will see later today or tomorrow, as appropriate.    Jerolyn Center, PT Acute Rehabilitation Services  Office [phone]   Zena Amos 01/25/2023, 11:50 AM

## 2023-01-25 NOTE — Progress Notes (Signed)
OT Cancellation Note  Patient Details Name: AJANI NEUBAUER MRN: [id number] DOB: 1945-11-01   Cancelled Treatment:    Reason Eval/Treat Not Completed: (P) Medical issues which prohibited therapy, Per nursing, HR averaging around 180 at rest, not medically ready at this time, will check back later or tomorrow when medically stable.   Alexis Goodell 01/25/2023, 10:18 AM

## 2023-01-25 NOTE — Progress Notes (Signed)
OT Cancellation Note  Patient Details Name: Samantha [name] MRN: [id number] DOB: Dec 22, 1945   Cancelled Treatment:    Reason Eval/Treat Not Completed: (P) Fatigue/lethargy limiting ability to participate, returned to check on Pt, HR appears to have lowered from earlier, Pt was not able to wake up for more than a few seconds at a time, very lethargic, will try again tomorrow.  Alexis Goodell 01/25/2023, 2:50 PM

## 2023-01-25 NOTE — Progress Notes (Signed)
SLP Cancellation Note  Patient Details Name: Samantha [name] MRN: [id number] DOB: 1946/02/10   Cancelled treatment:       Reason Eval/Treat Not Completed: Medical issues which prohibited therapy. Pt in distress. RN at bedside   , Riley Nearing 01/25/2023, 9:33 AM

## 2023-01-25 NOTE — Consult Note (Signed)
Cardiology Consultation   Patient ID: Samantha [name] MRN: [id number]; DOB: 12-23-1945  Admit date: 01/23/2023 Date of Consult: 01/25/2023  PCP:  Hillery Aldo, NP   [facility] Providers Cardiologist:  Charlton Haws, MD  Cardiology APP:  Sharlene Dory, NP       Patient Profile:   Samantha [name] is a 77 y.o. female with a history of palpitations, hypertension, hyperlipidemia, type 2 diabetes mellitus, primary hyperthyroidism s/p iodine thyroid ablation in 11/2022, chronic low back pain and obesity who is being seen 01/25/2023 for the evaluation of atrial fibrillation with RVR at the request of Dr. Jerral Ralph.  History of Present Illness:   Samantha [name] is a 77 year old female with the above history who is followed by Dr. Eden Emms. Prior Myoview in 05/2020 was low risk with no evidence of ischemia or infarction. At office visit in 07/2022, she reported palpitations and dizziness. She had recently completed a monitor and Echo that her PCP had ordered but we were unable to see the results at this time.  We requested the results be faxed to [place] but does not look like we have received these yet.  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  Subsequent thyroid function test were consistent with hypothyroidism.  She was last seen by Endocrinology on 01/09/2023 and plan at that time was to repeat thyroid function test in 8 weeks before initiating thyroid hormone therapy.  Patient presented to the ED on 01/19/2019 for for further evaluation of abdominal pain with nausea and vomiting for the past 2 months with possible hematemesis. She had recently been diagnosed with Klebsiella UTI and was treated with Bactrim. EKG showed tachycardia with no acute ischemic changes. Lab work was remarkable for creatinine of 1.27 (baseline around 0.8 to 0.9), albumin of 2.9, anemia of 11.8,  platelets of 472, and lipase of 106. Urinalysis also showed moderate leukocytes, few bacteria, and glucose >/= 500. Abdominal/ pelvic CT showed no acute intra-abdominal or pelvic pathology to explain symptoms.  Chest CTA showed no evidence of PE. She was also given Zofran and IV fluids and felt better.  She was felt to likely have gastroenteritis and was felt to be stable for discharge.  She presented back to the ED on 01/23/2019 for altered mental status and weakness. On arrival to the ED, she was tachycardic and BP intermittently soft. EKG showed sinus tachycardia rate 129 bpm, short PR, slight ST elevation in lead V2, and diffuse T wave inversions in inferior and anterolateral leads.  The T wave inversions appear new. High-sensitivity troponin 152 >> 139 >> 115 not consistent with ACS. Chest x-ray showed shallow inspiration with linear fibrosis or atelectasis in the lung bases. WBC 11.7, Hgb 13.2, Plts 397. Na 155, Cl 118, K 3.6, Glucose 297, CO2 13, BUN 31, Cr 1.98. Anion gap 24. Lipase 338. TSH low at 0.028 and free T4 and free T3 elevated at 17.5 and 7.7 respectively. pH 7.32, pCO2 25, pO2 47, bicarb 13.1. Lactic acid 3.9.  Blood cultures negative. Urine cultures pending. Head CT showed no acute findings.  She was admitted for acute metabolic encephalopathy felt to be from AKI and hypernatremia rather than sepsis physiology.  She was started on IV fluids.  There was also question whether she could have euglycemic DKA and she was given IV insulin to see if her acidosis would improve.  Cardiology insulted this morning for  evaluation of atrial fibrillation with rates as high as the 190s. She was started on IV Diltiazem (with 15mg  bolus) and a total of 20mg  of IV Lopressor with no significant improvement. Patient continues to have altered mental status. She will open her eyes for with sternal rub but is otherwise unresponsive. No family is present so history obtained from chart review. Patient's daughter told  to admitting provider that patient has been unwell for about 1 month and complaining of right flank pain. Daughter also reported that she had increasing fatigue and confusion over that time that significantly worsened over the prior 24 hours before admission. With patient's degree of hypernatremia, suspect she has had decreased PO intake for a while.  Past Medical History:  Diagnosis Date   Allergic rhinitis    Arthritis of both knees    Arthritis of both shoulder regions    Bilateral carpal tunnel syndrome    Cataract    Chronic low back pain    CKD (chronic kidney disease), stage III (HCC)    Decreased vision    Diabetes mellitus    HTN (hypertension)    Hyperlipidemia    Morbid obesity (HCC)    Thyroid disease     Past Surgical History:  Procedure Laterality Date   CATARACT EXTRACTION     COLONOSCOPY     COLONOSCOPY N/A 09/15/2020   Procedure: COLONOSCOPY;  Surgeon: Corbin Ade, MD;  Location: [facility];  Service: Endoscopy;  Laterality: N/A;  ASA II / PM procedure   POLYPECTOMY  09/15/2020   Procedure: POLYPECTOMY;  Surgeon: Corbin Ade, MD;  Location: [facility];  Service: Endoscopy;;   TUBAL LIGATION       Home Medications:  Prior to Admission medications   Medication Sig Start Date End Date Taking? Authorizing Provider  acetaminophen (TYLENOL) 500 MG tablet Take 500-1,000 mg by mouth every 6 (six) hours as needed for mild pain.   Yes [provider]  amoxicillin-clavulanate (AUGMENTIN) 500-125 MG tablet Take 1 tablet by mouth 2 (two) times daily. 01/19/23  Yes [provider]  JARDIANCE 25 MG TABS tablet Take 25 mg by mouth daily. 07/20/22  Yes [provider]  lisinopril (ZESTRIL) 20 MG tablet Take 20 mg by mouth every morning. 07/20/22  Yes [provider]  metFORMIN (GLUCOPHAGE-XR) 500 MG 24 hr tablet Take 500 mg by mouth daily. 07/18/22  Yes [provider]  ondansetron (ZOFRAN) 4 MG tablet Take 1 tablet (4 mg total) by  mouth every 8 (eight) hours as needed for nausea or vomiting. 01/19/23  Yes Dyanne Iha, MD  OVER THE COUNTER MEDICATION Take 1 tablet by mouth at bedtime as needed (leg cramps). Medication: legatrin pm   Yes [provider]  sulfamethoxazole-trimethoprim (BACTRIM) 400-80 MG tablet Take 1 tablet by mouth daily. 01/17/23  Yes [provider]  methocarbamol (ROBAXIN) 500 MG tablet Take 1 tablet (500 mg total) by mouth 3 (three) times daily. May cause drowsiness.  Do not operate machinery or drive while taking this medication Patient not taking: Reported on 01/19/2023 01/04/23   Triplett, Tammy, PA-C  metoprolol tartrate (LOPRESSOR) 25 MG tablet Take 1 tablet (25 mg total) by mouth 2 (two) times daily. Patient not taking: Reported on 01/23/2023 01/04/23   Pauline Aus, PA-C  oxyCODONE-acetaminophen (PERCOCET/ROXICET) 5-325 MG tablet Take 1 tablet by mouth every 6 (six) hours as needed for severe pain. Patient not taking: Reported on 01/19/2023 01/03/23   Smitty Knudsen, PA-C  Inpatient Medications: Scheduled Meds:  aspirin EC  81 mg Oral Daily   feeding supplement  237 mL Oral BID BM   heparin  3,000 Units Intravenous Once   insulin aspart  2 Units Subcutaneous TID WC   metoprolol tartrate  10 mg Intravenous Q6H   multivitamin with minerals  1 tablet Oral Daily   Continuous Infusions:  cefTRIAXone (ROCEPHIN)  IV 1 g (01/24/23 1505)   dextrose 150 mL/hr at 01/25/23 1105   diltiazem (CARDIZEM) infusion 15 mg/hr (01/25/23 0920)   heparin     insulin 1.3 Units/hr (01/25/23 1000)   PRN Meds: acetaminophen **OR** acetaminophen, albuterol, dextrose, melatonin, ondansetron **OR** ondansetron (ZOFRAN) IV  Allergies:   No Known Allergies  Social History:   Social History   Socioeconomic History   Marital status: Widowed    Spouse name: Not on file   Number of children: Not on file   Years of education: Not on file   Highest education level: Not on file  Occupational  History   Not on file  Tobacco Use   Smoking status: Never   Smokeless tobacco: Never  Vaping Use   Vaping status: Never Used  Substance and Sexual Activity   Alcohol use: Yes    Comment: occasionally   Drug use: No   Sexual activity: Yes    Birth control/protection: Surgical    Comment: tubal  Other Topics Concern   Not on file  Social History Narrative   Not on file   Social Determinants of Health   Financial Resource Strain: Low Risk  (11/16/2022)   Overall Financial Resource Strain (CARDIA)    Difficulty of Paying Living Expenses: Not very hard  Food Insecurity: No Food Insecurity (01/24/2023)   Hunger Vital Sign    Worried About Running Out of Food in the Last Year: Never true    Ran Out of Food in the Last Year: Never true  Transportation Needs: No Transportation Needs (01/24/2023)   PRAPARE - Administrator, Civil Service (Medical): No    Lack of Transportation (Non-Medical): No  Physical Activity: Inactive (11/16/2022)   Exercise Vital Sign    Days of Exercise per Week: 0 days    Minutes of Exercise per Session: 0 min  Stress: No Stress Concern Present (11/16/2022)   [employer] of Occupational Health - Occupational Stress Questionnaire    Feeling of Stress : Only a little  Social Connections: Moderately Integrated (11/16/2022)   Social Connection and Isolation Panel [NHANES]    Frequency of Communication with Friends and Family: Three times a week    Frequency of Social Gatherings with Friends and Family: Once a week    Attends Religious Services: More than 4 times per year    Active Member of [employer] or Organizations: Yes    Attends Banker Meetings: 1 to 4 times per year    Marital Status: Widowed  Intimate Partner Violence: Not At Risk (01/24/2023)   Humiliation, Afraid, Rape, and Kick questionnaire    Fear of Current or Ex-Partner: No    Emotionally Abused: No    Physically Abused: No    Sexually Abused: No    Family History:    Family History  Problem Relation Age of Onset   Hypertension Father    Hypertension Mother    Diabetes Mother    Breast cancer Mother    Hypertension Sister    Cancer Sister        pancreatic   Cancer Sister  pancreatic   Liver disease Sister      ROS:  Please see the history of present illness.  Review of Systems  Unable to perform ROS: Mental status change   Physical Exam/Data:   Vitals:   01/25/23 0220 01/25/23 0332 01/25/23 0915 01/25/23 0930  BP: (!) 142/74 (!) 143/67 107/83 101/86  Pulse: (!) 118 (!) 118 (!) 149 (!) 154  Resp: (!) 21 20 (!) 27 (!) 23  Temp:  99.1 F (37.3 C)    TempSrc:  Axillary    SpO2: 99% 98% 98% 97%  Weight:      Height:        Intake/Output Summary (Last 24 hours) at 01/25/2023 1111 Last data filed at 01/25/2023 6578 Gross per 24 hour  Intake 0 ml  Output 600 ml  Net -600 ml      01/23/2023   11:00 PM 01/19/2023    3:21 PM 01/09/2023   10:54 AM  Last 3 Weights  Weight (lbs) 140 lb 3.4 oz 147 lb 14.9 oz 148 lb  Weight (kg) 63.6 kg 67.1 kg 67.132 kg     Body mass index is 26.49 kg/m.  General: 77 y.o. African-American female who is in no acute distress. Minimally responsive and intermittently restless. HEENT: Normocephalic and atraumatic. Sclera clear. Tongue dry. Neck: Supple. No JVD. Heart: Tachycardic with irregularly irregular rhythm. No murmurs, gallops, or rubs.  Lungs: No increased work of breathing. Clear to ausculation anteriorly. No wheezes, rhonchi, or rales.  Abdomen: Soft, non-distended, and non-tender to palpation.  Extremities: No lower extremity edema.    Skin: Warm and dry. Neuro: Minimally responsive. No focal deficits. Psych: Minimally responsive and intermittently restless.    EKG:  The EKG was personally reviewed and demonstrates:  EKG on 01/19/2023 showed sinus tachycardia with no acute ischemic changes. Repeat EKG on 01/23/2023 showed sinus tachycardia rate 129 bpm, short PR, slight ST elevation in lead  V2, and diffuse T wave inversions in inferior and anterolateral leads.   Telemetry:  Telemetry was personally reviewed and demonstrates:  Atrial fibrillation with rates in the 160s to 190s. Will occasionally have very brief runs of sinus rhythm and then will go straight back into rapid atrial fibrillation.  Relevant CV Studies:  Echocardiogram 01/24/2023: Impressions: 1. Elevated LVOT peak velocity of 4 m/s likely due to hyperdynamic LV  function and chordal SAM.   2. Left ventricular ejection fraction, by estimation, is >75%. The left  ventricle has hyperdynamic function. The left ventricle has no regional  wall motion abnormalities. There is mild concentric left ventricular  hypertrophy. Left ventricular diastolic  parameters are consistent with Grade I diastolic dysfunction (impaired  relaxation).   3. Right ventricular systolic function is normal. The right ventricular  size is normal.   4. The mitral valve is normal in structure. No evidence of mitral valve  regurgitation. No evidence of mitral stenosis.   5. The aortic valve is normal in structure. Aortic valve regurgitation is  not visualized. No aortic stenosis is present.   6. The inferior vena cava is dilated in size with >50% respiratory  variability, suggesting right atrial pressure of 8 mmHg.   Comparison(s): No prior Echocardiogram.   Laboratory Data:  High Sensitivity Troponin:   Recent Labs  Lab 01/23/23 1319 01/23/23 1505 01/23/23 1851  TROPONINIHS 152* 139* 115*     Chemistry Recent Labs  Lab 01/24/23 2209 01/25/23 0218 01/25/23 0815  NA 164* 161* 165*  K 3.3* 3.1* 3.3*  CL >130* >130* >  130*  CO2 15* 19* 21*  GLUCOSE 176* 190* 166*  BUN 19 19 17   CREATININE 1.28* 1.11* 1.23*  CALCIUM 11.2* 10.9* 11.1*  GFRNONAA 43* 52* 46*  ANIONGAP NOT CALCULATED NOT CALCULATED NOT CALCULATED    Recent Labs  Lab 01/19/23 1527 01/23/23 1319 01/24/23 0142  PROT 7.3 7.7 6.3*  ALBUMIN 2.9* 3.3* 2.6*  AST 18  23 17   ALT 18 20 17   ALKPHOS 99 88 70  BILITOT 0.5 1.0 0.9   Lipids No results for input(s): "CHOL", "TRIG", "HDL", "LABVLDL", "LDLCALC", "CHOLHDL" in the last 168 hours.  Hematology Recent Labs  Lab 01/23/23 1851 01/24/23 0142 01/25/23 0816  WBC 12.9* 12.9* 10.1  RBC 4.98 4.52 4.53  HGB 12.2 11.2* 11.2*  HCT 42.3 38.5 38.1  MCV 84.9 85.2 84.1  MCH 24.5* 24.8* 24.7*  MCHC 28.8* 29.1* 29.4*  RDW 16.5* 16.5* 16.6*  PLT 241 295 209   Thyroid  Recent Labs  Lab 01/23/23 1319 01/23/23 1851  TSH 0.028*  --   FREET4  --  4.47*    BNP Recent Labs  Lab 01/23/23 1851  BNP 112.5*    DDimer No results for input(s): "DDIMER" in the last 168 hours.   Radiology/Studies:  ECHOCARDIOGRAM COMPLETE  Result Date: 01/24/2023    ECHOCARDIOGRAM REPORT   Patient Name:   NEMESIS KOVALSKY Date of Exam: 01/24/2023 Medical Rec #:  [id number]   Height:       61.0 in Accession #:    [id number]  Weight:       140.2 lb Date of Birth:  [birth year]   BSA:          1.624 m Patient Age:    77 years    BP:           142/79 mmHg Patient Gender: F           HR:           122 bpm. Exam Location:  Inpatient Procedure: 2D Echo, Cardiac Doppler and Color Doppler Indications:    Elevated troponin  History:        Patient has no prior history of Echocardiogram examinations.                 CKD, Signs/Symptoms:Fatigue; Risk Factors:Dyslipidemia,                 Hypertension and Diabetes. Sepsis.  Sonographer:    Wallie Char Referring Phys: Skip Mayer, A IMPRESSIONS  1. Elevated LVOT peak velocity of 4 m/s likely due to hyperdynamic LV function and chordal SAM.  2. Left ventricular ejection fraction, by estimation, is >75%. The left ventricle has hyperdynamic function. The left ventricle has no regional wall motion abnormalities. There is mild concentric left ventricular hypertrophy. Left ventricular diastolic parameters are consistent with Grade I diastolic dysfunction (impaired relaxation).  3. Right ventricular  systolic function is normal. The right ventricular size is normal.  4. The mitral valve is normal in structure. No evidence of mitral valve regurgitation. No evidence of mitral stenosis.  5. The aortic valve is normal in structure. Aortic valve regurgitation is not visualized. No aortic stenosis is present.  6. The inferior vena cava is dilated in size with >50% respiratory variability, suggesting right atrial pressure of 8 mmHg. Comparison(s): No prior Echocardiogram. FINDINGS  Left Ventricle: Left ventricular ejection fraction, by estimation, is >75%. The left ventricle has hyperdynamic function. The left ventricle has no regional wall motion abnormalities. The left ventricular internal cavity  size was normal in size. There is mild concentric left ventricular hypertrophy. Left ventricular diastolic parameters are consistent with Grade I diastolic dysfunction (impaired relaxation). Right Ventricle: The right ventricular size is normal. Right ventricular systolic function is normal. Left Atrium: Left atrial size was normal in size. Right Atrium: Right atrial size was normal in size. Pericardium: Trivial pericardial effusion is present. Mitral Valve: The mitral valve is normal in structure. No evidence of mitral valve regurgitation. No evidence of mitral valve stenosis. MV peak gradient, 6.2 mmHg. The mean mitral valve gradient is 3.0 mmHg. Tricuspid Valve: The tricuspid valve is normal in structure. Tricuspid valve regurgitation is mild . No evidence of tricuspid stenosis. Aortic Valve: The aortic valve is normal in structure. Aortic valve regurgitation is not visualized. No aortic stenosis is present. Aortic valve mean gradient measures 10.0 mmHg. Aortic valve peak gradient measures 17.3 mmHg. Aortic valve area, by VTI measures 1.57 cm. Pulmonic Valve: The pulmonic valve was normal in structure. Pulmonic valve regurgitation is not visualized. No evidence of pulmonic stenosis. Aorta: The aortic root is normal in  size and structure. Venous: The inferior vena cava is dilated in size with greater than 50% respiratory variability, suggesting right atrial pressure of 8 mmHg. IAS/Shunts: No atrial level shunt detected by color flow Doppler. Additional Comments: Elevated LVOT peak velocity of 4 m/s likely due to hyperdynamic LV function and chordal SAM.  LEFT VENTRICLE PLAX 2D LVIDd:         3.50 cm     Diastology LVIDs:         2.00 cm     LV e' medial:    6.81 cm/s LV PW:         0.90 cm     LV E/e' medial:  13.4 LV IVS:        0.70 cm     LV e' lateral:   6.73 cm/s LVOT diam:     1.50 cm     LV E/e' lateral: 13.6 LV SV:         45 LV SV Index:   28 LVOT Area:     1.77 cm  LV Volumes (MOD) LV vol d, MOD A2C: 42.3 ml LV vol d, MOD A4C: 57.2 ml LV vol s, MOD A2C: 11.1 ml LV vol s, MOD A4C: 12.6 ml LV SV MOD A2C:     31.2 ml LV SV MOD A4C:     57.2 ml LV SV MOD BP:      37.5 ml RIGHT VENTRICLE             IVC RV Basal diam:  2.90 cm     IVC diam: 2.30 cm RV S prime:     21.00 cm/s TAPSE (M-mode): 2.1 cm LEFT ATRIUM             Index        RIGHT ATRIUM          Index LA diam:        3.20 cm 1.97 cm/m   RA Area:     8.47 cm LA Vol (A2C):   36.6 ml 22.54 ml/m  RA Volume:   15.80 ml 9.73 ml/m LA Vol (A4C):   37.9 ml 23.34 ml/m LA Biplane Vol: 39.4 ml 24.26 ml/m  AORTIC VALVE AV Area (Vmax):    1.60 cm AV Area (Vmean):   1.55 cm AV Area (VTI):     1.57 cm AV Vmax:  208.00 cm/s AV Vmean:          145.500 cm/s AV VTI:            0.288 m AV Peak Grad:      17.3 mmHg AV Mean Grad:      10.0 mmHg LVOT Vmax:         188.00 cm/s LVOT Vmean:        127.500 cm/s LVOT VTI:          0.256 m LVOT/AV VTI ratio: 0.89 MITRAL VALVE                TRICUSPID VALVE MV Area (PHT): 3.89 cm     TR Peak grad:   36.5 mmHg MV Area VTI:   2.18 cm     TR Vmax:        302.00 cm/s MV Peak grad:  6.2 mmHg MV Mean grad:  3.0 mmHg     SHUNTS MV Vmax:       1.24 m/s     Systemic VTI:  0.26 m MV Vmean:      74.9 cm/s    Systemic Diam: 1.50 cm MV  Decel Time: 195 msec MV E velocity: 91.30 cm/s MV A velocity: 136.00 cm/s MV E/A ratio:  0.67 Olga Millers MD Electronically signed by Olga Millers MD Signature Date/Time: 01/24/2023/10:45:00 AM    Final    US RENAL  Result Date: 01/23/2023 CLINICAL DATA:  Urinary tract infection. EXAM: RENAL / URINARY TRACT ULTRASOUND COMPLETE COMPARISON:  Ultrasound dated 12/02/2020. FINDINGS: Right Kidney: Renal measurements: 8.2 x 4.3 x 4.5 cm = volume: 83 mL. Normal echogenicity. No hydronephrosis or shadowing stone. Left Kidney: Renal measurements: 9.2 x 5.1 x 5.7 cm = volume: 139 mL. Normal echogenicity. No hydronephrosis or shadowing stone. Bladder: Appears normal for degree of bladder distention. Other: None. IMPRESSION: Unremarkable renal ultrasound. Electronically Signed   By: Elgie Collard M.D.   On: 01/23/2023 20:20   DG CHEST PORT 1 VIEW  Result Date: 01/23/2023 CLINICAL DATA:  Cough. Altered mental status. Treatment for urinary tract infection for 4 days. EXAM: PORTABLE CHEST 1 VIEW COMPARISON:  CT 01/19/2023 FINDINGS: Shallow inspiration. Linear atelectasis or fibrosis in both lung bases. No developing consolidation or edema. Normal heart size. Mediastinal contours appear intact. No pleural effusion or pneumothorax. Degenerative changes in the spine and shoulders. IMPRESSION: Shallow inspiration with linear fibrosis or atelectasis in the lung bases. Electronically Signed   By: Burman Nieves M.D.   On: 01/23/2023 17:53   CT Head Wo Contrast  Result Date: 01/23/2023 CLINICAL DATA:  Mental status change, unknown cause EXAM: CT HEAD WITHOUT CONTRAST TECHNIQUE: Contiguous axial images were obtained from the base of the skull through the vertex without intravenous contrast. RADIATION DOSE REDUCTION: This exam was performed according to the departmental dose-optimization program which includes automated exposure control, adjustment of the mA and/or kV according to patient size and/or use of iterative  reconstruction technique. COMPARISON:  CT Head 11/14/11 FINDINGS: Brain: No evidence of acute infarction, hemorrhage, hydrocephalus, extra-axial collection or mass lesion/mass effect. Vascular: No hyperdense vessel or unexpected calcification. Skull: Normal. Negative for fracture or focal lesion. Sinuses/Orbits: No middle ear or mastoid effusion. Paranasal sinuses are clear. Bilateral lens replacement. Orbits are otherwise unremarkable. Other: None. IMPRESSION: No acute intracranial abnormality. Electronically Signed   By: Lorenza Cambridge M.D.   On: 01/23/2023 16:14     Assessment and Plan:   New Onset Atrial Fibrillation with RVR Patient was admitted with  AKI, severe hypernatremia, and acute metabolic encephalopathy. She presented with sinus tachycardia but went into rapid atrial fibrillation this morning with ventricular rates as high as the 190s. Echo showed hyperdynamic LV function with elevated LVOT peak velocity likely due to hyperdynamic LV function and chordal SAM (suspect this is due to severe dehydration). She was started on IV Diltiazem drip with 15mg  bolus and has also been given a total of 20mg  of IV Lopressor. However, she remains in rapid atrial fibrillation with rates in the 160s to 170s. She was diagnosed with hyperthyroidism from toxic multinodular a goiter a couple of months ago and iodine thyroid ablation in 11/2022. TSH this admission low at 0.028 and free T4 and free T3 elevated at 17.5 and 7.7 respectively. Potassium 3.1 >> 3.3 today. Sodium 155 on admission and up to 165 this morning. Patient continues to be encephalopathic. She is intermittently restless in the bed and will briefly open her eye with sternal rub but otherwise unresponsive. Suspect RVR is being driven by severe electrolyte derangement and hyperthyroidism. We can increase IV Diltiazem drip to 20mg /hr (order placed) and can continue to use IV Lopressor. Discussed with Alejandro Mulling, PA-C with EP team, and Amiodarone is the  only potential antiarrhythmic option (but not ideal with her hyperthyroidism). Spoke with Dr. Jerral Ralph who was going to reach out to patient's Endocrinologist to see if he was okay with Amiodarone. CHA2DS2-VASc score = 5 (HTN, DM, age x2, female). Agree with IV Heparin. She will ultimately need Eliquis when able to take PO medications. BP is currently stable so there is no need for urgent cardioversion. Doubt she would hold sinus rhythm even if we did cardiovert her with here severe electrolyte abnormalities and hyperthyroidism.  Will replete her potassium and check a magnesium level. Given her degree of hypernatremia, suspect she has had minimal PO intake for a while. Recommend aggressive IV fluids per primary team. May need to consult Nephrology. Will defer management of her hypothyroidism to primary team as well.  Elevated Troponin High-sensitivity troponin 152 >> 139 >> 115 consistent with demand ischemia. EKG does shows some new T wave inversions but this is in the setting of severe electrolyte derangement. Echo shows LVEF of >75% with no regional wall motion abnormalities, mild LVH, and grade 1 diastolic dysfunction. No plans for ischemic evaluation at this time. Can repeat EKG when electrolytes have improved.  Hypertension BP stable. Continue IV Diltiazem and IV Lopressor as above.  Otherwise, per primary team: - Hypernatremia: Sodium 155 on admission and now up to 165 this morning - Hyperchloremia: Chloride >130 - Hypercalcemia - AKI: Creatinine 1.98 on admission but improved to 1.23 today. Baseline around 0.8 to 0.9.  - Anion gap metabolic acidosis - Acute metabolic encephalopathy  - Recent UTI - Hyperthyroidism s/p ablation in 11/2022 - Type 2 diabetes mellitus - Hyperlipidemia    Risk Assessment/Risk Scores:    CHA2DS2-VASc Score = 5  This indicates a 7.2% annual risk of stroke. The patient's score is based upon: CHF History: 0 HTN History: 1 Diabetes History: 1 Stroke History:  0 Vascular Disease History: 0 Age Score: 2 Gender Score: 1   For questions or updates, please contact [facility] Please consult [email or web address] for contact info under    Signed, Corrin Parker, PA-C  01/25/2023 11:11 AM

## 2023-01-25 NOTE — TOC Initial Note (Signed)
Transition of Care [facility]) - Initial/Assessment Note    Patient Details  Name: Samantha [name] MRN: [id number] Date of Birth: 1945-10-13  Transition of [facility]) CM/SW Contact:    Mearl Latin, LCSW Phone Number: 01/25/2023, 9:31 AM  Clinical Narrative:                 Patient admitted from home with family Lucila [place] and daughter supports). CSW following for SNF needs versus home health. She is currently NPO.   Expected Discharge Plan: Skilled Nursing Facility Barriers to Discharge: Continued Medical Work up, English as a second language teacher   Patient Goals and CMS Choice            Expected Discharge Plan and Services In-house Referral: Clinical Social Work     Living arrangements for the past 2 months: Single Family Home                                      Prior Living Arrangements/Services Living arrangements for the past 2 months: Single Family Home Lives with:: Self Patient language and need for interpreter reviewed:: Yes        Need for Family Participation in Patient Care: Yes (Comment) Care giver support system in place?: Yes (comment)   Criminal Activity/Legal Involvement Pertinent to Current Situation/Hospitalization: No - Comment as needed  Activities of Daily Living Home Assistive Devices/Equipment: Environmental consultant (specify type) ADL Screening (condition at time of admission) Patient's cognitive ability adequate to safely complete daily activities?: Yes Is the patient deaf or have difficulty hearing?: No Does the patient have difficulty seeing, even when wearing glasses/contacts?: No Does the patient have difficulty concentrating, remembering, or making decisions?: No Patient able to express need for assistance with ADLs?: Yes Does the patient have difficulty dressing or bathing?: No Independently performs ADLs?: Yes (appropriate for developmental age) Does the patient have difficulty walking or climbing stairs?: Yes Weakness of Legs: Both Weakness of  Arms/Hands: None  Permission Sought/Granted                  Emotional Assessment Appearance:: Appears stated age Attitude/Demeanor/Rapport: Unable to Assess Affect (typically observed): Unable to Assess Orientation: : Oriented to Self Alcohol / Substance Use: Not Applicable Psych Involvement: No (comment)  Admission diagnosis:  Lactic acidosis [E87.20] Disorientation [R41.0] AKI (acute kidney injury) (HCC) [N17.9] Sepsis secondary to UTI (HCC) [A41.9, N39.0] Patient Active Problem List   Diagnosis Date Noted   Malnutrition of moderate degree 01/24/2023   Sepsis secondary to UTI (HCC) 01/23/2023   Vaginal dryness 11/16/2022   Normal breast exam 11/16/2022   Vaginal discharge 11/16/2022   Vulvar itching 11/16/2022   Screening examination for STD (sexually transmitted disease) 11/16/2022   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   Type 2 diabetes mellitus without complication, without long-term current use of insulin (HCC) 10/06/2022   Hyperthyroidism 10/05/2022   Pain in joint of right knee 06/20/2022   KNEE, ARTHRITIS, DEGEN./OSTEO 12/16/2007   DERANGEMENT MENISCUS 12/16/2007   HLD (hyperlipidemia) 12/18/2006   HYPERLIPIDEMIA 05/07/2006   OBESITY NOS 05/07/2006   ANEMIA-NOS 05/07/2006   CARPAL TUNNEL SYNDROME 05/07/2006   Essential hypertension, benign 05/07/2006   PCP:  Hillery Aldo, NP Pharmacy:   [phone] - [place], [place] - [address] AT [facility] [address] [place] [address] Phone: [phone] Fax: [phone]     Social Determinants of Health (SDOH) Social History: SDOH Screenings   Food Insecurity:  No Food Insecurity (01/24/2023)  Housing: Low Risk  (01/24/2023)  Transportation Needs: No Transportation Needs (01/24/2023)  Utilities: Not At Risk (01/24/2023)  Alcohol Screen: Low Risk  (11/16/2022)  Depression (PHQ2-9): Medium Risk (11/16/2022)  Financial Resource Strain: Low Risk  (11/16/2022)  Physical Activity: Inactive  (11/16/2022)  Social Connections: Moderately Integrated (11/16/2022)  Stress: No Stress Concern Present (11/16/2022)  Tobacco Use: Low Risk  (01/23/2023)   SDOH Interventions:     Readmission Risk Interventions     No data to display

## 2023-01-25 NOTE — Consult Note (Signed)
ANTICOAGULATION CONSULT NOTE - Initial Consult  Pharmacy Consult for Heparin  Indication:  Afib   No Known Allergies  Patient Measurements: Height: 5\' 1"  (154.9 cm) Weight: 63.6 kg (140 lb 3.4 oz) IBW/kg (Calculated) : 47.8 Heparin Dosing Weight: 60.9  Vital Signs: Temp: 99.1 F (37.3 C) (08/15 0332) Temp Source: Axillary (08/15 0332) BP: 143/67 (08/15 0332) Pulse Rate: 118 (08/15 0332)  Labs: Recent Labs    01/23/23 1319 01/23/23 1505 01/23/23 1851 01/24/23 0142 01/24/23 1433 01/24/23 1756 01/24/23 2209 01/25/23 0218  HGB 13.2  --  12.2 11.2*  --   --   --   --   HCT 45.4  --  42.3 38.5  --   --   --   --   PLT 397  --  241 295  --   --   --   --   CREATININE 1.98*  --  1.84* 1.69*   < > 1.31* 1.28* 1.11*  TROPONINIHS 152* 139* 115*  --   --   --   --   --    < > = values in this interval not displayed.    Estimated Creatinine Clearance: 36.8 mL/min (A) (by C-G formula based on SCr of 1.11 mg/dL (H)).   Medical History: Past Medical History:  Diagnosis Date   Allergic rhinitis    Arthritis of both knees    Arthritis of both shoulder regions    Bilateral carpal tunnel syndrome    Cataract    Chronic low back pain    CKD (chronic kidney disease), stage III (HCC)    Decreased vision    Diabetes mellitus    HTN (hypertension)    Hyperlipidemia    Morbid obesity (HCC)    Thyroid disease     Medications:  Scheduled:   aspirin EC  81 mg Oral Daily   enoxaparin (LOVENOX) injection  40 mg Subcutaneous Q24H   feeding supplement  237 mL Oral BID BM   insulin aspart  2 Units Subcutaneous TID WC   metoprolol tartrate  10 mg Intravenous Q6H   multivitamin with minerals  1 tablet Oral Daily   Infusions:   cefTRIAXone (ROCEPHIN)  IV 1 g (01/24/23 1505)   dextrose 75 mL/hr at 01/25/23 0835   diltiazem (CARDIZEM) infusion 5 mg/hr (01/25/23 0820)   insulin 1.2 Units/hr (01/25/23 0859)   lactated ringers     Anti-infectives (From admission, onward)    Start      Dose/Rate Route Frequency Ordered Stop   01/24/23 1500  cefTRIAXone (ROCEPHIN) 1 g in sodium chloride 0.9 % 100 mL IVPB        1 g 200 mL/hr over 30 Minutes Intravenous Every 24 hours 01/23/23 1836 01/30/23 1459   01/23/23 1430  cefTRIAXone (ROCEPHIN) 2 g in sodium chloride 0.9 % 100 mL IVPB  Status:  Discontinued        2 g 200 mL/hr over 30 Minutes Intravenous Every 24 hours 01/23/23 1417 01/23/23 1835       Assessment: Patient was admitted on 8/13 due to increased AMS and flank pain, now with Afib RVR on 8/15.   Pharmacy has been consulted to begin a Heparin infusion. Patient is not on any anticoagulation therapy prior to admin. Patient was last given Heparin subcutaneously this morning (transitioned to Lovenox px, but no doses given). Patient has s/sx of AKI. There are no signs of bleeding for the patient. Hbg, hct, and plts are all stable.    Goal of  Therapy:  Heparin level 0.3-0.7 units/ml Monitor platelets by anticoagulation protocol: Yes   Plan:  D/c Lovenox Give 3000 units bolus x 1 Start heparin infusion at 850 units/hr Check anti-Xa level in 8 hours and daily while on heparin Continue to monitor H&H and platelets  Merla Riches, PharmD 01/25/2023,9:06 AM

## 2023-01-25 NOTE — Consult Note (Signed)
ANTICOAGULATION CONSULT NOTE - Initial Consult  Pharmacy Consult for Heparin  Indication:  Afib   No Known Allergies  Patient Measurements: Height: 5\' 1"  (154.9 cm) Weight: 63.6 kg (140 lb 3.4 oz) IBW/kg (Calculated) : 47.8 Heparin Dosing Weight: 60.9  Vital Signs: Temp: 98.9 F (37.2 C) (08/15 2000) Temp Source: Oral (08/15 2000) BP: 129/63 (08/15 2000) Pulse Rate: 84 (08/15 2000)  Labs: Recent Labs    01/23/23 1319 01/23/23 1505 01/23/23 1851 01/24/23 0142 01/24/23 1433 01/25/23 0815 01/25/23 0816 01/25/23 1457 01/25/23 1952 01/25/23 2129  HGB 13.2  --  12.2 11.2*  --   --  11.2*  --   --   --   HCT 45.4  --  42.3 38.5  --   --  38.1  --   --   --   PLT 397  --  241 295  --   --  209  --   --   --   HEPARINUNFRC  --   --   --   --   --   --   --   --   --  0.41  CREATININE 1.98*  --  1.84* 1.69*   < > 1.23*  --  1.02* 0.94  --   TROPONINIHS 152* 139* 115*  --   --   --   --   --   --   --    < > = values in this interval not displayed.    Estimated Creatinine Clearance: 43.5 mL/min (by C-G formula based on SCr of 0.94 mg/dL).   Medical History: Past Medical History:  Diagnosis Date   Allergic rhinitis    Arthritis of both knees    Arthritis of both shoulder regions    Bilateral carpal tunnel syndrome    Cataract    Chronic low back pain    CKD (chronic kidney disease), stage III (HCC)    Decreased vision    Diabetes mellitus    HTN (hypertension)    Hyperlipidemia    Morbid obesity (HCC)    Thyroid disease     Medications:  Scheduled:   aspirin EC  81 mg Oral Daily   feeding supplement  237 mL Oral BID BM   metoprolol tartrate  10 mg Intravenous Q6H   multivitamin with minerals  1 tablet Oral Daily   Infusions:   cefTRIAXone (ROCEPHIN)  IV 1 g (01/25/23 1627)   dextrose 40 mL/hr at 01/25/23 2048   diltiazem (CARDIZEM) infusion 10 mg/hr (01/25/23 1930)   heparin 850 Units/hr (01/25/23 1117)   insulin 1.4 Units/hr (01/25/23 2140)   magnesium  sulfate bolus IVPB     potassium chloride     Anti-infectives (From admission, onward)    Start     Dose/Rate Route Frequency Ordered Stop   01/24/23 1500  cefTRIAXone (ROCEPHIN) 1 g in sodium chloride 0.9 % 100 mL IVPB        1 g 200 mL/hr over 30 Minutes Intravenous Every 24 hours 01/23/23 1836 01/30/23 1459   01/23/23 1430  cefTRIAXone (ROCEPHIN) 2 g in sodium chloride 0.9 % 100 mL IVPB  Status:  Discontinued        2 g 200 mL/hr over 30 Minutes Intravenous Every 24 hours 01/23/23 1417 01/23/23 1835       Assessment: Patient was admitted on 8/13 due to increased AMS and flank pain, now with Afib RVR on 8/15.   Pharmacy has been consulted to begin a Heparin infusion. Patient  is not on any anticoagulation therapy prior to admin. Patient was last given Heparin subcutaneously this morning (transitioned to Lovenox px, but no doses given). Patient has s/sx of AKI. There are no signs of bleeding for the patient. Hbg, hct, and plts are all stable.    Goal of Therapy:  Heparin level 0.3-0.7 units/ml Monitor platelets by anticoagulation protocol: Yes   Plan:  D/c Lovenox Give 3000 units bolus x 1 Start heparin infusion at 850 units/hr Check anti-Xa level in 8 hours and daily while on heparin Continue to monitor H&H and platelets   8/15 PM update: HL 0.41 therapeutic.  No bleeding or infusion issues per RN.  Plan: Continue heparin infusion @ 850 units/hr F/u heparin level with AM labs (~8h confirmatory level) Monitor s/sx bleeding, daily CBC and heparin levels F/u plan for long-term anticoagulation  Trixie Rude, PharmD Clinical Pharmacist 01/25/2023  10:19 PM  Please check AMION for all [facility] Pharmacy phone numbers After 10:00 PM, call Main Pharmacy [phone]

## 2023-01-26 ENCOUNTER — Inpatient Hospital Stay (HOSPITAL_COMMUNITY): Payer: Medicare HMO

## 2023-01-26 DIAGNOSIS — E870 Hyperosmolality and hypernatremia: Secondary | ICD-10-CM | POA: Diagnosis not present

## 2023-01-26 DIAGNOSIS — A419 Sepsis, unspecified organism: Secondary | ICD-10-CM | POA: Diagnosis not present

## 2023-01-26 DIAGNOSIS — I471 Supraventricular tachycardia, unspecified: Secondary | ICD-10-CM | POA: Diagnosis not present

## 2023-01-26 DIAGNOSIS — I480 Paroxysmal atrial fibrillation: Secondary | ICD-10-CM | POA: Diagnosis not present

## 2023-01-26 DIAGNOSIS — N390 Urinary tract infection, site not specified: Secondary | ICD-10-CM | POA: Diagnosis not present

## 2023-01-26 DIAGNOSIS — N179 Acute kidney failure, unspecified: Secondary | ICD-10-CM | POA: Diagnosis not present

## 2023-01-26 DIAGNOSIS — G9341 Metabolic encephalopathy: Secondary | ICD-10-CM | POA: Diagnosis not present

## 2023-01-26 LAB — BASIC METABOLIC PANEL
Anion gap: 8 (ref 5–15)
Anion gap: 9 (ref 5–15)
BUN: 13 mg/dL (ref 8–23)
BUN: 12 mg/dL (ref 8–23)
CO2: 20 mmol/L — ABNORMAL LOW (ref 22–32)
CO2: 18 mmol/L — ABNORMAL LOW (ref 22–32)
Calcium: 10.6 mg/dL — ABNORMAL HIGH (ref 8.9–10.3)
Calcium: 9.8 mg/dL (ref 8.9–10.3)
Chloride: 128 mmol/L — ABNORMAL HIGH (ref 98–111)
Chloride: 121 mmol/L — ABNORMAL HIGH (ref 98–111)
Creatinine, Ser: 1.16 mg/dL — ABNORMAL HIGH (ref 0.44–1.00)
Creatinine, Ser: 0.98 mg/dL (ref 0.44–1.00)
GFR, Estimated: 49 mL/min — ABNORMAL LOW (ref >60)
GFR, Estimated: 60 mL/min — ABNORMAL LOW (ref >60)
Glucose, Bld: 178 mg/dL — ABNORMAL HIGH (ref 70–99)
Glucose, Bld: 332 mg/dL — ABNORMAL HIGH (ref 70–99)
Potassium: 3.6 mmol/L (ref 3.5–5.1)
Potassium: 3.6 mmol/L (ref 3.5–5.1)
Sodium: 156 mmol/L — ABNORMAL HIGH (ref 135–145)
Sodium: 148 mmol/L — ABNORMAL HIGH (ref 135–145)

## 2023-01-26 LAB — CBC
HCT: 32.5 % — ABNORMAL LOW (ref 36.0–46.0)
Hemoglobin: 9.8 g/dL — ABNORMAL LOW (ref 12.0–15.0)
MCH: 24.7 pg — ABNORMAL LOW (ref 26.0–34.0)
MCHC: 30.2 g/dL (ref 30.0–36.0)
MCV: 82.1 fL (ref 80.0–100.0)
Platelets: 196 10*3/uL (ref 150–400)
RBC: 3.96 MIL/uL (ref 3.87–5.11)
RDW: 16.6 % — ABNORMAL HIGH (ref 11.5–15.5)
WBC: 9.2 10*3/uL (ref 4.0–10.5)
nRBC: 0 % (ref 0.0–0.2)

## 2023-01-26 LAB — GLUCOSE, CAPILLARY
Glucose-Capillary: 128 mg/dL — ABNORMAL HIGH (ref 70–99)
Glucose-Capillary: 142 mg/dL — ABNORMAL HIGH (ref 70–99)
Glucose-Capillary: 151 mg/dL — ABNORMAL HIGH (ref 70–99)
Glucose-Capillary: 156 mg/dL — ABNORMAL HIGH (ref 70–99)
Glucose-Capillary: 157 mg/dL — ABNORMAL HIGH (ref 70–99)
Glucose-Capillary: 159 mg/dL — ABNORMAL HIGH (ref 70–99)
Glucose-Capillary: 165 mg/dL — ABNORMAL HIGH (ref 70–99)
Glucose-Capillary: 173 mg/dL — ABNORMAL HIGH (ref 70–99)
Glucose-Capillary: 196 mg/dL — ABNORMAL HIGH (ref 70–99)
Glucose-Capillary: 197 mg/dL — ABNORMAL HIGH (ref 70–99)
Glucose-Capillary: 197 mg/dL — ABNORMAL HIGH (ref 70–99)
Glucose-Capillary: 206 mg/dL — ABNORMAL HIGH (ref 70–99)
Glucose-Capillary: 222 mg/dL — ABNORMAL HIGH (ref 70–99)
Glucose-Capillary: 262 mg/dL — ABNORMAL HIGH (ref 70–99)

## 2023-01-26 LAB — MAGNESIUM: Magnesium: 2 mg/dL (ref 1.7–2.4)

## 2023-01-26 LAB — PHOSPHORUS
Phosphorus: 1.5 mg/dL — ABNORMAL LOW (ref 2.5–4.6)
Phosphorus: 2.9 mg/dL (ref 2.5–4.6)

## 2023-01-26 LAB — HEPARIN LEVEL (UNFRACTIONATED): Heparin Unfractionated: 0.3 [IU]/mL (ref 0.30–0.70)

## 2023-01-26 MED ORDER — OSMOLITE 1.2 CAL PO LIQD
1000.0000 mL | ORAL | Status: DC
  Administered 2023-01-26 – 2023-01-29 (×4): 1000 mL
  Filled 2023-01-26 (×6): qty 1000

## 2023-01-26 MED ORDER — POTASSIUM CHLORIDE 10 MEQ/100ML IV SOLN
10.0000 meq | INTRAVENOUS | Status: AC
  Administered 2023-01-26 (×2): 10 meq via INTRAVENOUS
  Filled 2023-01-26 (×2): qty 100

## 2023-01-26 MED ORDER — METHIMAZOLE 5 MG PO TABS
10.0000 mg | ORAL_TABLET | Freq: Two times a day (BID) | ORAL | Status: DC
  Administered 2023-01-26 – 2023-01-29 (×7): 10 mg
  Filled 2023-01-26 (×7): qty 2

## 2023-01-26 MED ORDER — PROSOURCE TF20 ENFIT COMPATIBL EN LIQD
60.0000 mL | Freq: Every day | ENTERAL | Status: DC
  Administered 2023-01-26 – 2023-01-29 (×4): 60 mL
  Filled 2023-01-26 (×4): qty 60

## 2023-01-26 MED ORDER — FREE WATER
300.0000 mL | Status: DC
  Administered 2023-01-26 – 2023-01-27 (×5): 300 mL

## 2023-01-26 MED ORDER — POTASSIUM PHOSPHATES 15 MMOLE/5ML IV SOLN
30.0000 mmol | Freq: Once | INTRAVENOUS | Status: AC
  Administered 2023-01-26: 30 mmol via INTRAVENOUS
  Filled 2023-01-26: qty 10

## 2023-01-26 MED ORDER — INSULIN ASPART 100 UNIT/ML IJ SOLN
0.0000 [IU] | INTRAMUSCULAR | Status: DC
  Administered 2023-01-26: 3 [IU] via SUBCUTANEOUS
  Administered 2023-01-26 – 2023-01-27 (×3): 2 [IU] via SUBCUTANEOUS
  Administered 2023-01-27 (×2): 3 [IU] via SUBCUTANEOUS
  Administered 2023-01-27: 5 [IU] via SUBCUTANEOUS

## 2023-01-26 MED ORDER — INSULIN GLARGINE-YFGN 100 UNIT/ML ~~LOC~~ SOLN
8.0000 [IU] | Freq: Every day | SUBCUTANEOUS | Status: DC
  Administered 2023-01-26 – 2023-01-27 (×2): 8 [IU] via SUBCUTANEOUS
  Filled 2023-01-26 (×2): qty 0.08

## 2023-01-26 MED ORDER — METOPROLOL TARTRATE 25 MG/10 ML ORAL SUSPENSION
25.0000 mg | Freq: Two times a day (BID) | ORAL | Status: DC
  Administered 2023-01-26 – 2023-01-29 (×6): 25 mg
  Filled 2023-01-26 (×7): qty 10

## 2023-01-26 NOTE — Progress Notes (Signed)
Physical Therapy Evaluation Patient Details Name: Samantha [name] MRN: [id number] DOB: 1945-07-06 Today's Date: 01/26/2023  History of Present Illness  77 yo female presenting to ED 8/13 with increased confusion and progressive weakness over the last 24 hours. Found to have sepsis after recent UTI diagnosis. CTH negative. AKI 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, UTI, HTN, T2DM, HLDL, chronic lower back pain, CKDIII  Clinical Impression  Pt admitted with/for confusion and weakness.  Pt's mentation starting to improve, but pt is definitely not at baseline functioning, needing moderate assist for all basic mobility today and was limited to transfers.  Pt currently limited functionally due to the problems listed. ( See problems list.)   Pt will benefit from PT to maximize function and safety in order to get ready for next venue listed below.         If plan is discharge home, recommend the following: A little help with walking and/or transfers;A little help with bathing/dressing/bathroom;Assistance with cooking/housework;Assist for transportation;Help with stairs or ramp for entrance   Can travel by private vehicle   Yes    Equipment Recommendations Other (comment) (TBD)  Recommendations for Other Services       Functional Status Assessment Patient has had a recent decline in their functional status and demonstrates the ability to make significant improvements in function in a reasonable and predictable amount of time.     Precautions / Restrictions Precautions Precautions: Fall      Mobility  Bed Mobility Overal bed mobility: Needs Assistance Bed Mobility: Supine to Sit     Supine to sit: Mod assist     General bed mobility comments: cues for initiation, direction.  mod assist  for stability and assist at various points in the transition to EOB.  Mod assist to asymmetrically scoot to EOB    Transfers Overall transfer level: Needs  assistance   Transfers: Sit to/from Stand, Bed to chair/wheelchair/BSC Sit to Stand: Mod assist, +2 safety/equipment Stand pivot transfers: Mod assist Step pivot transfers: Mod assist       General transfer comment: cues for technique, hand placement and overall safety.  ~6 stands and 4 transfers during session with pt needing to use [facility] toward the end of the session as therapy was about to leave.    Ambulation/Gait               General Gait Details: pivot transfer to/from bed chair and bsc.  Stairs            Wheelchair Mobility     Tilt Bed    Modified Rankin (Stroke Patients Only)       Balance Overall balance assessment: Mild deficits observed, not formally tested                                           Pertinent Vitals/Pain Pain Assessment Pain Assessment: Faces Faces Pain Scale: Hurts a little bit Pain Location: knees, generalized Pain Descriptors / Indicators: Grimacing, Discomfort Pain Intervention(s): Monitored during session    Home Living Family/patient expects to be discharged to:: Private residence Living Arrangements: Other relatives (grandson lives with her.) Available Help at Discharge: Family;Available PRN/intermittently;Other (Comment) (grandson works during the day, other family unable to help days.) Type of Home: House Home Access: Ramped entrance;Stairs to enter   [employer] of Steps: 1   Home Layout: One level Home Equipment:  Rolling Walker (2 wheels)      Prior Function Prior Level of Function : Independent/Modified Independent             Mobility Comments: pt mod I in the home with RW, cane .  Could go to the bathroom, fix something to eat etc. ADLs Comments: pt was I with ADL's,     Extremity/Trunk Assessment   Upper Extremity Assessment Upper Extremity Assessment: Generalized weakness    Lower Extremity Assessment Lower Extremity Assessment: Generalized weakness     Cervical / Trunk Assessment Cervical / Trunk Assessment: Kyphotic  Communication   Communication Communication: No apparent difficulties  Cognition Arousal: Alert Behavior During Therapy: WFL for tasks assessed/performed Overall Cognitive Status: Impaired/Different from baseline Area of Impairment: Attention, Following commands, Awareness, Problem solving                   Current Attention Level: Sustained   Following Commands: Follows one step commands with increased time   Awareness: Intellectual Problem Solving: Slow processing, Decreased initiation          General Comments      Exercises Other Exercises Other Exercises: hip/knee flexion/ext ROM with graded resistance Other Exercises: bicep/tricep presses with graded resistance.   Assessment/Plan    PT Assessment Patient needs continued PT services  PT Problem List Decreased strength;Decreased activity tolerance;Decreased balance;Decreased mobility;Decreased coordination;Decreased cognition;Decreased knowledge of use of DME       PT Treatment Interventions Gait training;Functional mobility training;Therapeutic activities;Therapeutic exercise;Balance training;Neuromuscular re-education;Patient/family education    PT Goals (Current goals can be found in the Care Plan section)  Acute Rehab PT Goals Patient Stated Goal: pt didn't state. PT Goal Formulation: Patient unable to participate in goal setting Time For Goal Achievement: 02/09/23 Potential to Achieve Goals: Good    Frequency Min 1X/week     Co-evaluation               AM-PAC PT "6 Clicks" Mobility  Outcome Measure Help needed turning from your back to your side while in a flat bed without using bedrails?: A Lot Help needed moving from lying on your back to sitting on the side of a flat bed without using bedrails?: A Lot Help needed moving to and from a bed to a chair (including a wheelchair)?: A Lot Help needed standing up from a chair  using your arms (e.g., wheelchair or bedside chair)?: A Lot Help needed to walk in hospital room?: A Lot Help needed climbing 3-5 steps with a railing? : A Lot 6 Click Score: 12    End of Session   Activity Tolerance: Patient limited by fatigue;Patient tolerated treatment well Patient left: in chair;with call bell/phone within reach;with chair alarm set;with family/visitor present Nurse Communication: Mobility status PT Visit Diagnosis: Unsteadiness on feet (R26.81);Muscle weakness (generalized) (M62.81);Difficulty in walking, not elsewhere classified (R26.2)    Time: 1610-9604 PT Time Calculation (min) (ACUTE ONLY): 58 min   Charges:   PT Evaluation $PT Eval Moderate Complexity: 1 Mod PT Treatments $Therapeutic Exercise: 8-22 mins $Therapeutic Activity: 8-22 mins $Self Care/Home Management: 8-22 PT General Charges $$ ACUTE PT VISIT: 1 Visit         01/26/2023  Jacinto Halim., PT Acute Rehabilitation Services [phone]  (office)  Samantha [name] 01/26/2023, 6:11 PM

## 2023-01-26 NOTE — Progress Notes (Signed)
OT Cancellation Note  Patient Details Name: Samantha [name] MRN: [id number] DOB: [birth year]   Cancelled Treatment:    Reason Eval/Treat Not Completed: Patient not medically ready-Pt following no commands, lethargic, fleetingly opens eyes but fixed to top LT field without tracking/pursuits or eye contact. RN notified and agrees to hold on OT for today.   Theodoro Clock 01/26/2023, 9:00 AM

## 2023-01-26 NOTE — Procedures (Signed)
Cortrak  Person Inserting Tube:  Kaelin Holford T, RD Tube Type:  Cortrak - 43 inches Tube Size:  10 Tube Location:  Left nare Secured by: Bridle Technique Used to Measure Tube Placement:  Marking at nare/corner of mouth Cortrak Secured At:  62 cm   Cortrak Tube Team Note:  Consult received to place a Cortrak feeding tube.   X-ray is required, abdominal x-ray has been ordered by the Cortrak team. Please confirm tube placement before using the Cortrak tube.   If the tube becomes dislodged please keep the tube and contact the Cortrak team at [email or web address] for replacement.  If after hours and replacement cannot be delayed, place a NG tube and confirm placement with an abdominal x-ray.    Shelle Iron RD, LDN For contact information, refer to [facility].

## 2023-01-26 NOTE — Progress Notes (Signed)
ANTICOAGULATION CONSULT NOTE - Follow Up Consult  Pharmacy Consult for Heparin Indication: atrial fibrillation  No Known Allergies  Patient Measurements: Height: 5\' 1"  (154.9 cm) Weight: 64 kg (141 lb 1.5 oz) IBW/kg (Calculated) : 47.8 Heparin Dosing Weight: 64 kg  Vital Signs: Temp: 98.5 F (36.9 C) (08/16 0731) Temp Source: Axillary (08/16 0731) BP: 144/64 (08/16 0731) Pulse Rate: 88 (08/16 0731)  Labs: Recent Labs    01/23/23 1319 01/23/23 1505 01/23/23 1851 01/24/23 0142 01/24/23 1433 01/25/23 0816 01/25/23 1457 01/25/23 1952 01/25/23 2129 01/26/23 0547  HGB 13.2  --  12.2 11.2*  --  11.2*  --   --   --  9.8*  HCT 45.4  --  42.3 38.5  --  38.1  --   --   --  32.5*  PLT 397  --  241 295  --  209  --   --   --  196  HEPARINUNFRC  --   --   --   --   --   --   --   --  0.41 0.30  CREATININE 1.98*  --  1.84* 1.69*   < >  --  1.02* 0.94  --  1.16*  TROPONINIHS 152* 139* 115*  --   --   --   --   --   --   --    < > = values in this interval not displayed.    Estimated Creatinine Clearance: 35.4 mL/min (A) (by C-G formula based on SCr of 1.16 mg/dL (H)).  Assessment: Patient was admitted on 8/13 due to increased AMS and flank pain, now with Afib RVR on 8/15.    Pharmacy consulted 01/25/23 to begin a Heparin infusion for atrial fibrillation. Patient is not on any anticoagulation therapy prior to admin. Initially received SQ heparin for VTE prophylaxis, last given 8/15 at 0525. Was to transition to Enoxaparin for VTE prophylaxis on 8/15 but none given prior to change to IV Heparin.  Heparin level low therapeutic (0.30) on 850 units/hr.  Trended down from initial level 0.41.   Hemoglobin and platelet count trended down, no bleeding reported.    Goal of Therapy:  Heparin level 0.3-0.7 units/ml Monitor platelets by anticoagulation protocol: Yes   Plan:  Increase heparin drip from 850 to 950 units/hr to try to maintain heparin level in target range. Daily heparin  level and CBC. Follow up anticoagulation plans.  Dennie Fetters, RPh 01/26/2023,9:24 AM

## 2023-01-26 NOTE — Progress Notes (Signed)
Speech Language Pathology Treatment: Dysphagia  Patient Details Name: Samantha [name] MRN: [id number] DOB: 1946-02-08 Today's Date: 01/26/2023 Time: 5638-7564 SLP Time Calculation (min) (ACUTE ONLY): 22 min  Assessment / Plan / Recommendation Clinical Impression  Pt more alert and responsive to questions today, interacting with her family at bedside. SLP provided thorough oral care, removing thick secretions from her lingual surface and buccal cavities. Pt intermittently able to follow commands to complete an oral motor exam, which was grossly WFL, although pt required frequent repetitions and cueing. Observed with one ice chip and one sip of water via teaspoon with significant and immediate coughing following the swallow. Given pt's mentation and clinical signs of aspiration, a PO diet is not appropriate at this time. Per MD, plan for Cortrak placement and further SLP f/u as able. Suspect pt may require an instrumental swallow study once mentation improves. Will continue to follow for readiness.    HPI HPI: Samantha [name] is a 77 yo female presenting to ED 8/13 with increased confusion and progressive weakness over the last 24 hours. Found to have sepsis after recent UTI diagnosis. CTH negative. CXR revealed shallow inspiration with linear fibrosis or atelectasis in lung bases. Cortrak ordered 8/16, placement pending. 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  Recommendations may be updated based on patient status, additional functional criteria and insurance authorization.    Recommendations  Diet recommendations: NPO Medication Administration: Via alternative means                  Oral care QID   Frequent or  constant Supervision/Assistance Dysphagia, unspecified (R13.10)     Continue with current plan of care     Gwynneth Aliment, M.A., CF-SLP Speech Language Pathology, Acute Rehabilitation Services  Secure Chat preferred [phone]   01/26/2023, 12:19 PM

## 2023-01-26 NOTE — Care Management Important Message (Signed)
Important Message  Patient Details  Name: Samantha [name] MRN: [id number] Date of Birth: 03/14/1946   Medicare Important Message Given:  Yes     [name] Stefan Church 01/26/2023, 3:07 PM

## 2023-01-26 NOTE — Progress Notes (Addendum)
PROGRESS NOTE        PATIENT DETAILS Name: Samantha [name] Age: 77 y.o. Sex: female Date of Birth: 03/06/1946 Admit Date: 01/23/2023 Admitting Physician Lurline Del, MD ZOX:[id number], Devonne Doughty, NP  Brief Summary: Patient is a 77 y.o.  female with history of DM-2, HTN, hyperthyroidism-s/p RAI radioactive iodine ablation 06/24-who was apparently diagnosed with a UTI-initially on Bactrim-then changed to Augmentin-presented with altered mental status x 2-3 days along with very poor oral intake.  Patient was found to have AKI/metabolic acidosis and hypernatremia-and subsequently admitted to the hospitalist service.  Significant events: 8/13>> admit to [facility] 8/14>> started insulin GTT for euglycemic DKA 8/15>> A-fib RVR-no response to IV Lopressor, started Cardizem infusion-cardiology consult. 8/16>> more awake/alert compared to past few days-maintaining sinus rhythm-failed SLP eval-Cortrak ordered  Significant studies: 8/13>> CT head: No acute intracranial abnormality 8/13>> CXR: No obvious PNA 8/13>> renal ultrasound: No hydronephrosis 8/15>> echo: EF> 75%.  Significant microbiology data: 7/25>> urine culture: Klebsiella 8/13>> COVID PCR: Negative 8/13>> blood cultures: Negative 8/14>> urine culture: Negative  Procedures: None  Consults: None  Subjective: More awake and alert-talking today to me today.  Daughter at bedside-acknowledges clinical improvement.  Objective: Vitals: Blood pressure (!) 144/64, pulse 88, temperature 98.5 F (36.9 C), temperature source Axillary, resp. rate (!) 22, height 5\' 1"  (1.549 m), weight 64 kg, SpO2 100%.   Exam: Gen Exam: Weak-but much more awake and alert compared to yesterday-able to say good morning to me today.  Able to cough.  Following commands. HEENT:atraumatic, normocephalic Chest: B/L clear to auscultation anteriorly CVS:S1S2 regular Abdomen:soft non tender, non distended Extremities:no edema Neurology:  Generalized weakness but nonfocal. Skin: no rash  Pertinent Labs/Radiology:    Latest Ref Rng & Units 01/26/2023    5:47 AM 01/25/2023    8:16 AM 01/24/2023    1:42 AM  CBC  WBC 4.0 - 10.5 K/uL 9.2  10.1  12.9   Hemoglobin 12.0 - 15.0 g/dL 9.8  54.0  98.1   Hematocrit 36.0 - 46.0 % 32.5  38.1  38.5   Platelets 150 - 400 K/uL 196  209  295     Lab Results  Component Value Date   NA 156 (H) 01/26/2023   K 3.6 01/26/2023   CL 128 (H) 01/26/2023   CO2 20 (L) 01/26/2023      Assessment/Plan: Acute metabolic encephalopathy Likely due to hypernatremia/AKI/possible euglycemic DKA CT head was negative Encephalopathy improving after treatment of underlying etiologies-she is much more awake and alert today compared to yesterday.  However she still has some room for improvement-hoping that continued improvement in her sodium levels will result in her mentation getting better over the weekend.  Do not think we need to pursue any further workup at this point his mentation is clearly improving.  Paroxysmal atrial fibrillation with RVR Occurred on 8/15-rates in the 180s-190s at times-subsequently converted back to sinus rhythm Maintaining sinus rhythm this morning Due to weakness/lethargy-inability to tolerate oral intake-she remains on Cardizem infusion and IV Lopressor On IV heparin as CHA2DS2-VASc score of around 5. Cardiology following-awaiting further recommendations.  AKI Likely hemodynamically mediated (poor oral intake and lisinopril use), euglycemic DKA and recent Bactrim use Creatinine improved after treatment of underlying etiologies and euglycemic DKA.    Hypernatremia Secondary to poor oral intake in the setting of recent UTI Sodium levels gradually improving with D5W  Continue to follow sodium levels-repeat later this afternoon.  Euglycemic DKA from Jardiance use Resolved with IV insulin infusion Since patient on D5W-n.p.o.-continuing IV insulin for now until she is a bit  more awake and alert or until a NG tube is placed later today. Would not rechallenge with Jardiance in the future.  Hypercalcemia Mild Suspect this is mostly due to dehydration/hemoconcentration Hydrating with IVF-and following labs.  Hypophosphatemia Replete/recheck  Hypokalemia Replete/recheck  Recent complicated UTI with Klebsiella pneumoniae Initially on Bactrim-then switched to Augmentin (as an outpatient) Doubt sepsis physiology-do not think it was present on admission Suspect altered mental status is from AKI/hypernatremia/euglycemic DKA rather than sepsis physiology Urine culture on admission was negative-will plan on 5 days of empiric treatment with IV Rocephin given overall clinical situation.  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  Patient/daughter at bedside aware and agreeable for NG tube.  Debility/deconditioning Due to critical illness PT/OT eval Suspect will require SNF sometime next week  Nutrition Status: Nutrition Problem: Moderate Malnutrition Etiology: chronic illness Signs/Symptoms: percent weight loss, mild fat depletion, moderate muscle depletion Percent weight loss: 22 % Interventions: Ensure Enlive (each supplement provides 350kcal and 20 grams of protein), MVI, Liberalize Diet    BMI: Estimated body mass index is 26.66 kg/m as calculated from the  following:   Height as of this encounter: 5\' 1"  (1.549 m).   Weight as of this encounter: 64 kg.   Code status:   Code Status: Full Code   DVT Prophylaxis: SCDs Start: 01/25/23 0805   Family Communication: Daughter at bedside   Disposition Plan: Status is: Inpatient Remains inpatient appropriate because: Severity of illness   Planned Discharge Destination:Home health   Diet: Diet Order             Diet NPO time specified  Diet effective now                     Antimicrobial agents: Anti-infectives (From admission, onward)    Start     Dose/Rate Route Frequency Ordered Stop   01/24/23 1500  cefTRIAXone (ROCEPHIN) 1 g in sodium chloride 0.9 % 100 mL IVPB        1 g 200 mL/hr over 30 Minutes Intravenous Every 24 hours 01/23/23 1836 01/30/23 1459   01/23/23 1430  cefTRIAXone (ROCEPHIN) 2 g in sodium chloride 0.9 % 100 mL IVPB  Status:  Discontinued        2 g 200 mL/hr over 30 Minutes Intravenous Every 24 hours 01/23/23 1417 01/23/23 1835        MEDICATIONS: Scheduled Meds:  aspirin EC  81 mg Oral Daily   metoprolol tartrate  10 mg Intravenous Q6H   Continuous Infusions:  cefTRIAXone (ROCEPHIN)  IV 1 g (01/25/23 1627)   dextrose 75 mL/hr at 01/26/23 1049   diltiazem (CARDIZEM) infusion 10 mg/hr (01/25/23 2347)   heparin 950 Units/hr (01/26/23 0946)   insulin 1.5 Units/hr (01/26/23 1007)   potassium PHOSPHATE IVPB (in  mmol) 30 mmol (01/26/23 0813)   PRN Meds:.acetaminophen **OR** acetaminophen, albuterol, dextrose, melatonin, ondansetron **OR** ondansetron (ZOFRAN) IV   I have personally reviewed following labs and imaging studies  LABORATORY DATA: CBC: Recent Labs  Lab 01/23/23 1319 01/23/23 1851 01/24/23 0142 01/25/23 0816 01/26/23 0547  WBC 11.7* 12.9* 12.9* 10.1 9.2  NEUTROABS 9.8*  --   --   --   --   HGB 13.2 12.2 11.2* 11.2* 9.8*  HCT 45.4 42.3 38.5 38.1 32.5*  MCV 85.0 84.9 85.2 84.1 82.1  PLT 397 241 295 209 196    Basic  Metabolic Panel: Recent Labs  Lab 01/25/23 0218 01/25/23 0815 01/25/23 1457 01/25/23 1952 01/26/23 0547  NA 161* 165* 159* 156* 156*  K 3.1* 3.3* 3.4* 3.6 3.6  CL >130* >130* >130* 127* 128*  CO2 19* 21* 17* 19* 20*  GLUCOSE 190* 166* 242* 200* 178*  BUN 19 17 15 14 13   CREATININE 1.11* 1.23* 1.02* 0.94 1.16*  CALCIUM 10.9* 11.1* 10.4* 10.4* 10.6*  MG  --   --  1.6* 1.6* 2.0  PHOS  --   --   --   --  1.5*    GFR: Estimated Creatinine Clearance: 35.4 mL/min (A) (by C-G formula based on SCr of 1.16 mg/dL (H)).  Liver Function Tests: Recent Labs  Lab 01/19/23 1527 01/23/23 1319 01/24/23 0142  AST 18 23 17   ALT 18 20 17   ALKPHOS 99 88 70  BILITOT 0.5 1.0 0.9  PROT 7.3 7.7 6.3*  ALBUMIN 2.9* 3.3* 2.6*   Recent Labs  Lab 01/19/23 1527 01/23/23 1319  LIPASE 106* 338*   No results for input(s): "AMMONIA" in the last 168 hours.  Coagulation Profile: No results for input(s): "INR", "PROTIME" in the last 168 hours.  Cardiac Enzymes: No results for input(s): "CKTOTAL", "CKMB", "CKMBINDEX", "TROPONINI" in the last 168 hours.  BNP (last 3 results) No results for input(s): "PROBNP" in the last 8760 hours.  Lipid Profile: No results for input(s): "CHOL", "HDL", "LDLCALC", "TRIG", "CHOLHDL", "LDLDIRECT" in the last 72 hours.  Thyroid Function Tests: Recent Labs    01/23/23 1319 01/23/23 1851  TSH 0.028*  --   T4TOTAL 17.5*  --   FREET4  --  4.47*  T3FREE 7.7*  --     Anemia Panel: No results for input(s): "VITAMINB12", "FOLATE", "FERRITIN", "TIBC", "IRON", "RETICCTPCT" in the last 72 hours.  Urine analysis:    Component Value Date/Time   COLORURINE YELLOW 01/24/2023 0336   APPEARANCEUR HAZY (A) 01/24/2023 0336   LABSPEC 1.018 01/24/2023 0336   PHURINE 5.0 01/24/2023 0336   GLUCOSEU >=500 (A) 01/24/2023 0336   HGBUR SMALL (A) 01/24/2023 0336   HGBUR negative 11/11/2007 1104   BILIRUBINUR NEGATIVE 01/24/2023 0336   KETONESUR 20 (A) 01/24/2023 0336    PROTEINUR NEGATIVE 01/24/2023 0336   UROBILINOGEN 0.2 11/18/2014 2105   NITRITE NEGATIVE 01/24/2023 0336   LEUKOCYTESUR NEGATIVE 01/24/2023 0336    Sepsis Labs: Lactic Acid, Venous    Component Value Date/Time   LATICACIDVEN 1.9 01/23/2023 1716    MICROBIOLOGY: Recent Results (from the past 240 hour(s))  Resp panel by RT-PCR (RSV, Flu A&B, Covid) Urine, Clean Catch     Status: None   Collection Time: 01/19/23  8:02 PM   Specimen: Urine, Clean Catch; Nasal Swab  Result Value Ref Range Status   SARS Coronavirus 2 by RT PCR NEGATIVE NEGATIVE Final   Influenza A by PCR NEGATIVE NEGATIVE Final   Influenza B by PCR  NEGATIVE NEGATIVE Final    Comment: (NOTE) The Xpert Xpress SARS-CoV-2/FLU/RSV plus assay is intended as an aid in the diagnosis of influenza from Nasopharyngeal swab specimens and should not be used as a sole basis for treatment. Nasal washings and aspirates are unacceptable for Xpert Xpress SARS-CoV-2/FLU/RSV testing.  Fact Sheet for Patients: BloggerCourse.com  Fact Sheet for Healthcare Providers: SeriousBroker.it  This test is not yet approved or cleared by the [place] FDA and has been authorized for detection and/or diagnosis of SARS-CoV-2 by FDA under an Emergency Use Authorization (EUA). This EUA will remain in effect (meaning this test can be used) for the duration of the COVID-19 declaration under Section 564(b)(1) of the Act, 21 U.S.C. section 360bbb-3(b)(1), unless the authorization is terminated or revoked.     Resp Syncytial Virus by PCR NEGATIVE NEGATIVE Final    Comment: (NOTE) Fact Sheet for Patients: BloggerCourse.com  Fact Sheet for Healthcare Providers: SeriousBroker.it  This test is not yet approved or cleared by the [place] FDA and has been authorized for detection and/or diagnosis of SARS-CoV-2 by FDA under an Emergency Use  Authorization (EUA). This EUA will remain in effect (meaning this test can be used) for the duration of the COVID-19 declaration under Section 564(b)(1) of the Act, 21 U.S.C. section 360bbb-3(b)(1), unless the authorization is terminated or revoked.  Performed at [facility], [address], [place], [place] [address]   Culture, blood (routine x 2)     Status: None (Preliminary result)   Collection Time: 01/23/23  1:00 PM   Specimen: BLOOD  Result Value Ref Range Status   Specimen Description BLOOD RIGHT ANTECUBITAL  Final   Special Requests   Final    BOTTLES DRAWN AEROBIC AND ANAEROBIC Blood Culture results may not be optimal due to an inadequate volume of blood received in culture bottles   Culture   Final    NO GROWTH 3 DAYS Performed at [facility], [address], [place], [place] [address]    Report Status PENDING  Incomplete  Culture, blood (routine x 2)     Status: None (Preliminary result)   Collection Time: 01/23/23  1:19 PM   Specimen: BLOOD  Result Value Ref Range Status   Specimen Description BLOOD BLOOD LEFT ARM  Final   Special Requests   Final    BOTTLES DRAWN AEROBIC AND ANAEROBIC Blood Culture adequate volume   Culture   Final    NO GROWTH 3 DAYS Performed at [facility], [address], [place], [place] [address]    Report Status PENDING  Incomplete  SARS Coronavirus 2 by RT PCR (hospital order, performed in [facility] hospital lab) *cepheid single result test* Anterior Nasal Swab     Status: None   Collection Time: 01/23/23  1:19 PM   Specimen: Anterior Nasal Swab  Result Value Ref Range Status   SARS Coronavirus 2 by RT PCR NEGATIVE NEGATIVE Final    Comment: Performed at [facility], [address], [place], [place] [address]  Urine Culture     Status: None   Collection Time: 01/24/23  3:39 AM   Specimen: Urine, Clean Catch  Result Value Ref Range Status   Specimen Description URINE, CLEAN CATCH  Final   Special Requests  NONE  Final   Culture   Final    NO GROWTH Performed at [facility], [address] [place], [place] [address]    Report Status 01/25/2023 FINAL  Final    RADIOLOGY STUDIES/RESULTS: No results found.   LOS: 3 days   Jeoffrey Massed, MD  [facility]    To contact the attending provider between 7A-7P or the covering provider during after hours 7P-7A, please log into the web site [email or web address] and access using universal [place] password for that web site. If you do not have the password, please call the hospital operator.  01/26/2023, 11:41 AM

## 2023-01-26 NOTE — Progress Notes (Signed)
Nutrition Follow-up  DOCUMENTATION CODES:   Non-severe (moderate) malnutrition in context of chronic illness  INTERVENTION:  Discontinue Ensure Enlive  Once placement is confirmed, tube feeds via Cortrak: Start Osmolite 1.2 at 20 ml/hr and advance by 10 mL every 12 hours to goal rate of 60 mL/hr (1440 mL per day) 60 mL ProSource TF20 - Daily Free water flush: 130 q6h or per MD Regimen provides 1800 mL, 100 gm protein, and 1701 mL total free water daily. Monitor magnesium, potassium, and phosphorus BID for at least 3 days, MD to replete as needed, as pt is at risk for refeeding syndrome given malnutrition and poor PO intake/NPO.  NUTRITION DIAGNOSIS:  Moderate Malnutrition related to chronic illness as evidenced by percent weight loss, mild fat depletion, moderate muscle depletion. - Ongoing  GOAL:  Patient will meet greater than or equal to 90% of their needs - Not met  MONITOR:  Diet advancement, Labs, TF tolerance, I & O's  REASON FOR ASSESSMENT:  Malnutrition Screening Tool    ASSESSMENT:  77 y.o. female presented to the ED with increased confusion and weakness. PMH includes CKD III, T2DM, HLD, HTN, and hyperthyroidism. Pt admitted with sepsis 2/2 UTI, AKI on CKD.   8/13 - Admitted 8/14 - NPO 8/16 - Cortrak placed   Discussed with team in rounds. Plan for Cortrak today due to failed bedside evaluation. Explained to family at bedside, plan to start nutrition via tube until pt is able to have diet advanced. No questions at this time.   Medications reviewed and include: MVI, IV antibiotics, Insulin drip, D5 at 75 mL/hr, IV potassium chloride, IV potassium phosphate  Labs reviewed: Sodium 156, Potassium 3.6, BUN 13, Creatinine 1.16, Phosphorus 1.5, Magnesium 2.0  CBG: 128-190 x 24 hrs  UOP: 425 mL x 24 hrs  Diet Order:   Diet Order             Diet NPO time specified  Diet effective now                  EDUCATION NEEDS:  Education needs have been  addressed  Skin:  Skin Assessment: Reviewed RN Assessment  Last BM:  8/15 - Type 6  Height:  Ht Readings from Last 1 Encounters:  01/23/23 5\' 1"  (1.549 m)   Weight:  Wt Readings from Last 1 Encounters:  01/26/23 64 kg   Ideal Body Weight:  47.7 kg  BMI:  Body mass index is 26.66 kg/m.  Estimated Nutritional Needs:  Kcal:  1700-1900 Protein:  85-105 grams Fluid:  >/= 1.7 L   Kirby Crigler RD, LDN Clinical Dietitian See [facility] for contact information.

## 2023-01-26 NOTE — Progress Notes (Signed)
Patient Name: Samantha [name] Date of Encounter: 01/26/2023 [facility] Cardiologist: Charlton Haws, MD   Interval Summary  .    More alert.  Mumbles but attempts to answer questions.   Vital Signs .    Vitals:   01/26/23 0400 01/26/23 0500 01/26/23 0600 01/26/23 0731  BP: (!) 150/125 (!) 115/52 130/65 (!) 144/64  Pulse: 99 85 73 88  Resp: 18 20 15  (!) 22  Temp:    98.5 F (36.9 C)  TempSrc:    Axillary  SpO2: 100% 100% 100% 100%  Weight:  64 kg    Height:        Intake/Output Summary (Last 24 hours) at 01/26/2023 0934 Last data filed at 01/26/2023 0404 Gross per 24 hour  Intake --  Output 425 ml  Net -425 ml      01/26/2023    5:00 AM 01/23/2023   11:00 PM 01/19/2023    3:21 PM  Last 3 Weights  Weight (lbs) 141 lb 1.5 oz 140 lb 3.4 oz 147 lb 14.9 oz  Weight (kg) 64 kg 63.6 kg 67.1 kg      Telemetry/ECG    Sinus arrhythmia.  SVT - Personally Reviewed  Physical Exam .    VS:  BP (!) 144/64 (BP Location: Right Arm)   Pulse 88   Temp 98.5 F (36.9 C) (Axillary)   Resp (!) 22   Ht 5\' 1"  (1.549 m)   Wt 64 kg   SpO2 100%   BMI 26.66 kg/m  , BMI Body mass index is 26.66 kg/m. GENERAL:  Ill-appearing HEENT: Pupils equal round and reactive, fundi not visualized, oral mucosa dry but improving NECK:  No jugular venous distention, waveform within normal limits, carotid upstroke brisk and symmetric, no bruits, no thyromegaly LUNGS:  Clear to auscultation bilaterally HEART:  RRR.  PMI not displaced or sustained,S1 and S2 within normal limits, no S3, no S4, no clicks, no rubs, no murmurs ABD:  Flat, positive bowel sounds normal in frequency in pitch, no bruits, no rebound, no guarding, no midline pulsatile mass, no hepatomegaly, no splenomegaly EXT:  2 plus pulses throughout, no edema, no cyanosis no clubbing SKIN:  No rashes no nodules NEURO:  Cranial nerves II through XII grossly intact, motor grossly intact throughout PSYCH:  Unable to answer questions fully.   Nods and mumbles  Assessment & Plan .     Samantha [name] is a 65F with hypertension, hyperlipidemia, diabetes and hyperthyroidism s/p recent ablation admitted with metabolic encephalopathy and profound hypernatremia and metabolic derangements.  Cardiology consulted for new onset atrial fibrillation with RVR.      # Atrial fibrillation with RVR:  Rates were uncontrolled on diltiazem infusion.  She has also received IV metoprolol.  She is now in sinus arrhythmia with bursts of SVT that is still quite fast.  Diltiazem infusion down to 5mg .  She still isn't on any oral medication at this time.  Arrhythmias will likely continue to settle as electrolytes continue to improve.  We will increase diltiazem to 20mg  /hr.  Start IV heparin.  She is not a candidate for amiodarone due to her uncorrected hyperthyroidism.  The iodine could worsen this condition.  She is also not a candidate for any other antiarrhythmics due to her profound metabolic derrangement.   Continue resuscitation and correction of her free water deficit per IM/nephrology.     For questions or updates, please contact [facility] Please consult [email or web address] for contact info under  Signed, Chilton Si, MD

## 2023-01-27 DIAGNOSIS — I480 Paroxysmal atrial fibrillation: Secondary | ICD-10-CM | POA: Diagnosis not present

## 2023-01-27 DIAGNOSIS — A419 Sepsis, unspecified organism: Secondary | ICD-10-CM | POA: Diagnosis not present

## 2023-01-27 DIAGNOSIS — I471 Supraventricular tachycardia, unspecified: Secondary | ICD-10-CM

## 2023-01-27 DIAGNOSIS — N390 Urinary tract infection, site not specified: Secondary | ICD-10-CM | POA: Diagnosis not present

## 2023-01-27 LAB — CBC
HCT: 31.8 % — ABNORMAL LOW (ref 36.0–46.0)
Hemoglobin: 9.7 g/dL — ABNORMAL LOW (ref 12.0–15.0)
MCH: 25.6 pg — ABNORMAL LOW (ref 26.0–34.0)
MCHC: 30.5 g/dL (ref 30.0–36.0)
MCV: 83.9 fL (ref 80.0–100.0)
Platelets: 166 10*3/uL (ref 150–400)
RBC: 3.79 MIL/uL — ABNORMAL LOW (ref 3.87–5.11)
RDW: 16.9 % — ABNORMAL HIGH (ref 11.5–15.5)
WBC: 8.4 10*3/uL (ref 4.0–10.5)
nRBC: 0 % (ref 0.0–0.2)

## 2023-01-27 LAB — BASIC METABOLIC PANEL
Anion gap: 13 (ref 5–15)
BUN: 14 mg/dL (ref 8–23)
CO2: 19 mmol/L — ABNORMAL LOW (ref 22–32)
Calcium: 9.8 mg/dL (ref 8.9–10.3)
Chloride: 120 mmol/L — ABNORMAL HIGH (ref 98–111)
Creatinine, Ser: 0.9 mg/dL (ref 0.44–1.00)
GFR, Estimated: >60 mL/min (ref >60)
Glucose, Bld: 242 mg/dL — ABNORMAL HIGH (ref 70–99)
Potassium: 3.6 mmol/L (ref 3.5–5.1)
Sodium: 152 mmol/L — ABNORMAL HIGH (ref 135–145)

## 2023-01-27 LAB — GLUCOSE, CAPILLARY
Glucose-Capillary: 176 mg/dL — ABNORMAL HIGH (ref 70–99)
Glucose-Capillary: 228 mg/dL — ABNORMAL HIGH (ref 70–99)
Glucose-Capillary: 274 mg/dL — ABNORMAL HIGH (ref 70–99)
Glucose-Capillary: 241 mg/dL — ABNORMAL HIGH (ref 70–99)
Glucose-Capillary: 289 mg/dL — ABNORMAL HIGH (ref 70–99)
Glucose-Capillary: 258 mg/dL — ABNORMAL HIGH (ref 70–99)

## 2023-01-27 LAB — MAGNESIUM: Magnesium: 1.8 mg/dL (ref 1.7–2.4)

## 2023-01-27 LAB — HEPARIN LEVEL (UNFRACTIONATED)
Heparin Unfractionated: 0.28 [IU]/mL — ABNORMAL LOW (ref 0.30–0.70)
Heparin Unfractionated: 0.42 [IU]/mL (ref 0.30–0.70)

## 2023-01-27 LAB — PHOSPHORUS: Phosphorus: 2.8 mg/dL (ref 2.5–4.6)

## 2023-01-27 MED ORDER — HEPARIN (PORCINE) 25000 UT/250ML-% IV SOLN
1100.0000 [IU]/h | INTRAVENOUS | Status: DC
  Administered 2023-01-27 – 2023-01-28 (×2): 1100 [IU]/h via INTRAVENOUS
  Filled 2023-01-27 (×2): qty 250

## 2023-01-27 MED ORDER — INSULIN GLARGINE-YFGN 100 UNIT/ML ~~LOC~~ SOLN
12.0000 [IU] | Freq: Every day | SUBCUTANEOUS | Status: DC
  Administered 2023-01-28 – 2023-02-01 (×5): 12 [IU] via SUBCUTANEOUS
  Filled 2023-01-27 (×5): qty 0.12

## 2023-01-27 MED ORDER — POTASSIUM CHLORIDE 20 MEQ PO PACK
40.0000 meq | PACK | Freq: Once | ORAL | Status: AC
  Administered 2023-01-27: 40 meq
  Filled 2023-01-27: qty 2

## 2023-01-27 MED ORDER — FREE WATER
300.0000 mL | Status: DC
  Administered 2023-01-27 – 2023-01-28 (×6): 300 mL

## 2023-01-27 MED ORDER — FREE WATER: 400.0000 mL | Status: DC

## 2023-01-27 MED ORDER — MAGNESIUM SULFATE 2 GM/50ML IV SOLN
2.0000 g | Freq: Once | INTRAVENOUS | Status: AC
  Administered 2023-01-27: 2 g via INTRAVENOUS
  Filled 2023-01-27: qty 50

## 2023-01-27 MED ORDER — INSULIN ASPART 100 UNIT/ML IJ SOLN
0.0000 [IU] | INTRAMUSCULAR | Status: DC
  Administered 2023-01-27 (×2): 8 [IU] via SUBCUTANEOUS
  Administered 2023-01-28: 5 [IU] via SUBCUTANEOUS
  Administered 2023-01-28 (×2): 8 [IU] via SUBCUTANEOUS
  Administered 2023-01-28 (×2): 5 [IU] via SUBCUTANEOUS
  Administered 2023-01-29: 8 [IU] via SUBCUTANEOUS
  Administered 2023-01-29: 5 [IU] via SUBCUTANEOUS
  Administered 2023-01-29: 8 [IU] via SUBCUTANEOUS
  Administered 2023-01-29: 5 [IU] via SUBCUTANEOUS

## 2023-01-27 NOTE — Progress Notes (Signed)
ANTICOAGULATION CONSULT NOTE - Follow Up Consult  Pharmacy Consult for Heparin Indication: atrial fibrillation  No Known Allergies  Patient Measurements: Height: 5\' 1"  (154.9 cm) Weight: 64.2 kg (141 lb 8.6 oz) IBW/kg (Calculated) : 47.8 Heparin Dosing Weight: 64 kg  Vital Signs: Temp: 97.6 F (36.4 C) (08/17 0300) Temp Source: Oral (08/17 0300) BP: 128/60 (08/17 0300) Pulse Rate: 96 (08/17 0300)  Labs: Recent Labs    01/25/23 0816 01/25/23 1457 01/25/23 1952 01/25/23 2129 01/26/23 0547 01/26/23 1403 01/27/23 0722  HGB 11.2*  --   --   --  9.8*  --  9.7*  HCT 38.1  --   --   --  32.5*  --  31.8*  PLT 209  --   --   --  196  --  166  HEPARINUNFRC  --   --   --  0.41 0.30  --  0.28*  CREATININE  --    < > 0.94  --  1.16* 0.98  --    < > = values in this interval not displayed.    Estimated Creatinine Clearance: 41.9 mL/min (by C-G formula based on SCr of 0.98 mg/dL).  Assessment: Patient was admitted on 8/13 due to increased AMS and flank pain, now with Afib RVR on 8/15.    Pharmacy consulted 01/25/23 to begin a Heparin infusion for atrial fibrillation. Patient is not on any anticoagulation therapy prior to admin. Initially received SQ heparin for VTE prophylaxis, last given 8/15 at 0525. Was to transition to Enoxaparin for VTE prophylaxis on 8/15 but none given prior to change to IV Heparin.  8/17: Heparin level subtherapeutic (0.28) on 950 units/hr.  Hemoglobin and platelet count trended down, no bleeding reported.    Goal of Therapy:  Heparin level 0.3-0.7 units/ml Monitor platelets by anticoagulation protocol: Yes   Plan:  Increase heparin drip to 1100 units/hr F/u heparin level in 8h  Monitor for s/sx of bleeding Daily heparin level and CBC  Merla Riches, PharmD  01/27/2023,8:11 AM

## 2023-01-27 NOTE — Progress Notes (Signed)
PROGRESS NOTE        PATIENT DETAILS Name: Samantha [name] Age: 77 y.o. Sex: female Date of Birth: [birth year] Admit Date: 01/23/2023 Admitting Physician Lurline Del, MD ZOX:[id number], Devonne Doughty, NP  Brief Summary: Patient is a 77 y.o.  female with history of DM-2, HTN, hyperthyroidism-s/p RAI radioactive iodine ablation 06/24-who was apparently diagnosed with a UTI-initially on Bactrim-then changed to Augmentin-presented with altered mental status x 2-3 days along with very poor oral intake.  Patient was found to have AKI/metabolic acidosis and hypernatremia-and subsequently admitted to the hospitalist service.  Significant events: 8/13>> admit to [facility] 8/14>> started insulin GTT for euglycemic DKA 8/15>> A-fib RVR-no response to IV Lopressor, started Cardizem infusion-cardiology consult. 8/16>> more awake/alert compared to past few days-maintaining sinus rhythm-failed SLP eval-Cortrak ordered  Significant studies: 8/13>> CT head: No acute intracranial abnormality 8/13>> CXR: No obvious PNA 8/13>> renal ultrasound: No hydronephrosis 8/15>> echo: EF> 75%.  Significant microbiology data: 7/25>> urine culture: Klebsiella 8/13>> COVID PCR: Negative 8/13>> blood cultures: Negative 8/14>> urine culture: Negative  Procedures: None  Consults: None  Subjective: So much better this morning-talking to me-claims some pain in her left neck area-completely awake and alert.  No family at bedside.  Objective: Vitals: Blood pressure (!) 128/54, pulse (!) 121, temperature 98 F (36.7 C), temperature source Oral, resp. rate 20, height 5\' 1"  (1.549 m), weight 64.2 kg, SpO2 100%.   Exam: Gen Exam:Alert awake-not in any distress.  Conversing today. HEENT:atraumatic, normocephalic Chest: B/L clear to auscultation anteriorly CVS:S1S2 regular Abdomen:soft non tender, non distended Extremities:no edema Neurology: Non focal-but has generalized weakness. Skin: no  rash  Pertinent Labs/Radiology:    Latest Ref Rng & Units 01/27/2023    7:22 AM 01/26/2023    5:47 AM 01/25/2023    8:16 AM  CBC  WBC 4.0 - 10.5 K/uL 8.4  9.2  10.1   Hemoglobin 12.0 - 15.0 g/dL 9.7  9.8  54.0   Hematocrit 36.0 - 46.0 % 31.8  32.5  38.1   Platelets 150 - 400 K/uL 166  196  209     Lab Results  Component Value Date   NA 152 (H) 01/27/2023   K 3.6 01/27/2023   CL 120 (H) 01/27/2023   CO2 19 (L) 01/27/2023      Assessment/Plan: Acute metabolic encephalopathy Likely due to hypernatremia/AKI/possible euglycemic DKA Significantly better after treatment above above etiologies-now much more awake and alert. Continue supportive care.  Paroxysmal atrial fibrillation with RVR Occurred on 8/15-rates in the 180s-190s at times-subsequently converted back to sinus rhythm Although maintaining sinus rhythm-has had a few runs of narrow complex tachycardia/SVT overnight On oral beta-blocker-remains on IV Cardizem infusion On IV heparin as CHA2DS2-VASc score of around 5. Cardiology following-awaiting further recommendations.  AKI Likely hemodynamically mediated (poor oral intake and lisinopril use), euglycemic DKA and recent Bactrim use Resolved with IVF and treatment of underlying euglycemic DKA.  Hypernatremia Secondary to poor oral intake in the setting of recent UTI Sodium levels gradually improving-unfortunately appears that she did not get any free water overnight Discussed with nursing staff-Ensure she gets free water today-recheck electrolytes tomorrow.  Thankfully her mentation is so much better.  Euglycemic DKA from Jardiance use Resolved with IV insulin infusion Has been transitioned to SQ insulin Would not rechallenge with Jardiance in the future.  DM-2 (A1c 9.8 on 8/30) CBGs on  the higher side Increase Semglee to 12 units, continue SSI Reassess 8/18  Recent Labs    01/26/23 2354 01/27/23 0335 01/27/23 0829  GLUCAP 262* 176* 228*      Hypercalcemia Suspect this is mostly due to dehydration/hemoconcentration Resolved with IVF.  Hypophosphatemia Replete/recheck  Hypokalemia Replete/recheck  Recent complicated UTI with Klebsiella pneumoniae Initially on Bactrim-then switched to Augmentin (as an outpatient) Doubt sepsis physiology-do not think it was present on admission Suspect altered mental status is from AKI/hypernatremia/euglycemic DKA rather than sepsis physiology Urine culture on admission was negative-Rocephin x 5 days-and then stop.   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 Nida-endocrinology on 8/16-recommendations were to start Tapazole. Already on beta-blocker Follow-up with endocrine as an outpatient  HTN BP stable-continue metoprolol  Oropharyngeal dysphagia Likely due to debility/deconditioning in the setting of acute/critical illness Although more awake and alert-per SLP-not yet ready for oral intake-Cortrak tube placed on 8/16-tolerating tube feeds well-hopefully this will be short-term and we could get it removed over the next several days.   Debility/deconditioning Due to critical illness PT/OT eval Suspect will require SNF sometime next week  Nutrition Status: Nutrition Problem: Moderate Malnutrition Etiology: chronic illness Signs/Symptoms: percent weight loss, mild fat depletion, moderate muscle depletion Percent weight loss: 22 % Interventions: Tube feeding    BMI: Estimated body mass index is 26.74 kg/m as calculated from the following:   Height as of this encounter: 5\' 1"  (1.549 m).   Weight as of this encounter: 64.2 kg.   Code status:   Code Status: Full Code   DVT Prophylaxis: SCDs Start: 01/25/23 0805   Family Communication: None at bedside   Disposition Plan: Status is: Inpatient Remains inpatient appropriate because: Severity of illness   Planned  Discharge Destination:Home health   Diet: Diet Order             Diet NPO time specified  Diet effective now                     Antimicrobial agents: Anti-infectives (From admission, onward)    Start     Dose/Rate Route Frequency Ordered Stop   01/24/23 1500  cefTRIAXone (ROCEPHIN) 1 g in sodium chloride 0.9 % 100 mL IVPB        1 g 200 mL/hr over 30 Minutes Intravenous Every 24 hours 01/23/23 1836 01/30/23 1459   01/23/23 1430  cefTRIAXone (ROCEPHIN) 2 g in sodium chloride 0.9 % 100 mL IVPB  Status:  Discontinued        2 g 200 mL/hr over 30 Minutes Intravenous Every 24 hours 01/23/23 1417 01/23/23 1835        MEDICATIONS: Scheduled Meds:  aspirin EC  81 mg Oral Daily   feeding supplement (PROSource TF20)  60 mL Per Tube Daily   free water  300 mL Per Tube Q4H   insulin aspart  0-9 Units Subcutaneous Q4H   insulin glargine-yfgn  8 Units Subcutaneous Daily   methIMAzole  10 mg Per Tube BID   metoprolol tartrate  25 mg Per Tube BID   Continuous Infusions:  cefTRIAXone (ROCEPHIN)  IV 1 g (01/26/23 1616)   diltiazem (CARDIZEM) infusion 5 mg/hr (01/27/23 0451)   feeding supplement (OSMOLITE 1.2 CAL) 40 mL/hr at 01/27/23 0830   heparin 1,100 Units/hr (01/27/23 0833)   PRN Meds:.acetaminophen **OR** acetaminophen, albuterol, dextrose, melatonin, ondansetron **OR** ondansetron (ZOFRAN) IV   I have personally reviewed following labs  and imaging studies  LABORATORY DATA: CBC: Recent Labs  Lab 01/23/23 1319 01/23/23 1851 01/24/23 0142 01/25/23 0816 01/26/23 0547 01/27/23 0722  WBC 11.7* 12.9* 12.9* 10.1 9.2 8.4  NEUTROABS 9.8*  --   --   --   --   --   HGB 13.2 12.2 11.2* 11.2* 9.8* 9.7*  HCT 45.4 42.3 38.5 38.1 32.5* 31.8*  MCV 85.0 84.9 85.2 84.1 82.1 83.9  PLT 397 241 295 209 196 166    Basic Metabolic Panel: Recent Labs  Lab 01/25/23 1457 01/25/23 1952 01/26/23 0547 01/26/23 1403 01/27/23 0722  NA 159* 156* 156* 148* 152*  K 3.4* 3.6 3.6 3.6  3.6  CL >130* 127* 128* 121* 120*  CO2 17* 19* 20* 18* 19*  GLUCOSE 242* 200* 178* 332* 242*  BUN 15 14 13 12 14   CREATININE 1.02* 0.94 1.16* 0.98 0.90  CALCIUM 10.4* 10.4* 10.6* 9.8 9.8  MG 1.6* 1.6* 2.0  --  1.8  PHOS  --   --  1.5* 2.9 2.8    GFR: Estimated Creatinine Clearance: 45.7 mL/min (by C-G formula based on SCr of 0.9 mg/dL).  Liver Function Tests: Recent Labs  Lab 01/23/23 1319 01/24/23 0142  AST 23 17  ALT 20 17  ALKPHOS 88 70  BILITOT 1.0 0.9  PROT 7.7 6.3*  ALBUMIN 3.3* 2.6*   Recent Labs  Lab 01/23/23 1319  LIPASE 338*   No results for input(s): "AMMONIA" in the last 168 hours.  Coagulation Profile: No results for input(s): "INR", "PROTIME" in the last 168 hours.  Cardiac Enzymes: No results for input(s): "CKTOTAL", "CKMB", "CKMBINDEX", "TROPONINI" in the last 168 hours.  BNP (last 3 results) No results for input(s): "PROBNP" in the last 8760 hours.  Lipid Profile: No results for input(s): "CHOL", "HDL", "LDLCALC", "TRIG", "CHOLHDL", "LDLDIRECT" in the last 72 hours.  Thyroid Function Tests: No results for input(s): "TSH", "T4TOTAL", "FREET4", "T3FREE", "THYROIDAB" in the last 72 hours.   Anemia Panel: No results for input(s): "VITAMINB12", "FOLATE", "FERRITIN", "TIBC", "IRON", "RETICCTPCT" in the last 72 hours.  Urine analysis:    Component Value Date/Time   COLORURINE YELLOW 01/24/2023 0336   APPEARANCEUR HAZY (A) 01/24/2023 0336   LABSPEC 1.018 01/24/2023 0336   PHURINE 5.0 01/24/2023 0336   GLUCOSEU >=500 (A) 01/24/2023 0336   HGBUR SMALL (A) 01/24/2023 0336   HGBUR negative 11/11/2007 1104   BILIRUBINUR NEGATIVE 01/24/2023 0336   KETONESUR 20 (A) 01/24/2023 0336   PROTEINUR NEGATIVE 01/24/2023 0336   UROBILINOGEN 0.2 11/18/2014 2105   NITRITE NEGATIVE 01/24/2023 0336   LEUKOCYTESUR NEGATIVE 01/24/2023 0336    Sepsis Labs: Lactic Acid, Venous    Component Value Date/Time   LATICACIDVEN 1.9 01/23/2023 1716     MICROBIOLOGY: Recent Results (from the past 240 hour(s))  Resp panel by RT-PCR (RSV, Flu A&B, Covid) Urine, Clean Catch     Status: None   Collection Time: 01/19/23  8:02 PM   Specimen: Urine, Clean Catch; Nasal Swab  Result Value Ref Range Status   SARS Coronavirus 2 by RT PCR NEGATIVE NEGATIVE Final   Influenza A by PCR NEGATIVE NEGATIVE Final   Influenza B by PCR NEGATIVE NEGATIVE Final    Comment: (NOTE) The Xpert Xpress SARS-CoV-2/FLU/RSV plus assay is intended as an aid in the diagnosis of influenza from Nasopharyngeal swab specimens and should not be used as a sole basis for treatment. Nasal washings and aspirates are unacceptable for Xpert Xpress SARS-CoV-2/FLU/RSV testing.  Fact Sheet for Patients: BloggerCourse.com  Fact Sheet for Healthcare Providers: SeriousBroker.it  This test is not yet approved or cleared by the [place] FDA and has been authorized for detection and/or diagnosis of SARS-CoV-2 by FDA under an Emergency Use Authorization (EUA). This EUA will remain in effect (meaning this test can be used) for the duration of the COVID-19 declaration under Section 564(b)(1) of the Act, 21 U.S.C. section 360bbb-3(b)(1), unless the authorization is terminated or revoked.     Resp Syncytial Virus by PCR NEGATIVE NEGATIVE Final    Comment: (NOTE) Fact Sheet for Patients: BloggerCourse.com  Fact Sheet for Healthcare Providers: SeriousBroker.it  This test is not yet approved or cleared by the [place] FDA and has been authorized for detection and/or diagnosis of SARS-CoV-2 by FDA under an Emergency Use Authorization (EUA). This EUA will remain in effect (meaning this test can be used) for the duration of the COVID-19 declaration under Section 564(b)(1) of the Act, 21 U.S.C. section 360bbb-3(b)(1), unless the authorization is terminated  or revoked.  Performed at [facility], [address], [place], [place] [address]   Culture, blood (routine x 2)     Status: None (Preliminary result)   Collection Time: 01/23/23  1:00 PM   Specimen: BLOOD  Result Value Ref Range Status   Specimen Description BLOOD RIGHT ANTECUBITAL  Final   Special Requests   Final    BOTTLES DRAWN AEROBIC AND ANAEROBIC Blood Culture results may not be optimal due to an inadequate volume of blood received in culture bottles   Culture   Final    NO GROWTH 4 DAYS Performed at [facility], [address] [place], [place] [address]    Report Status PENDING  Incomplete  Culture, blood (routine x 2)     Status: None (Preliminary result)   Collection Time: 01/23/23  1:19 PM   Specimen: BLOOD  Result Value Ref Range Status   Specimen Description BLOOD BLOOD LEFT ARM  Final   Special Requests   Final    BOTTLES DRAWN AEROBIC AND ANAEROBIC Blood Culture adequate volume   Culture   Final    NO GROWTH 4 DAYS Performed at [facility], [address], [place], [place] [address]    Report Status PENDING  Incomplete  SARS Coronavirus 2 by RT PCR (hospital order, performed in [facility] hospital lab) *cepheid single result test* Anterior Nasal Swab     Status: None   Collection Time: 01/23/23  1:19 PM   Specimen: Anterior Nasal Swab  Result Value Ref Range Status   SARS Coronavirus 2 by RT PCR NEGATIVE NEGATIVE Final    Comment: Performed at [facility], [address], [place], [place] [address]  Urine Culture     Status: None   Collection Time: 01/24/23  3:39 AM   Specimen: Urine, Clean Catch  Result Value Ref Range Status   Specimen Description URINE, CLEAN CATCH  Final   Special Requests NONE  Final   Culture   Final    NO GROWTH Performed at [facility], [address], [place], [place] [address]    Report Status 01/25/2023 FINAL  Final    RADIOLOGY STUDIES/RESULTS: DG Abd Portable 1V  Result Date:  01/26/2023 CLINICAL DATA:  Status post enteric tube placement. EXAM: PORTABLE ABDOMEN - 1 VIEW COMPARISON:  01/23/2023 FINDINGS: Interval placement of a weighted feeding tube. The tip is well below the level of the GE junction projecting over the right upper quadrant  of the abdomen. This is either in the distal stomach or proximal duodenum. No dilated bowel loops identified. IMPRESSION: Weighted feeding tube tip is well below the level of the GE junction projecting over the right upper quadrant of the abdomen. Electronically Signed   By: Signa Kell M.D.   On: 01/26/2023 15:51     LOS: 4 days   Jeoffrey Massed, MD  [facility]    To contact the attending provider between 7A-7P or the covering provider during after hours 7P-7A, please log into the web site [email or web address] and access using universal [place] password for that web site. If you do not have the password, please call the hospital operator.  01/27/2023, 10:54 AM

## 2023-01-27 NOTE — TOC Initial Note (Signed)
Transition of Care [facility]) - Initial/Assessment Note    Patient Details  Name: Samantha [name] MRN: [id number] Date of Birth: 01-Dec-1945  [facility]) CM/SW Contact:    Ralene Bathe, LCSW Phone Number: 01/27/2023, 9:36 AM  Clinical Narrative:                 CSW received consult for possible SNF placement at time of discharge. CSW spoke with patient's daughter, Samantha [name], as the patient is only oriented to person.   The daughter expressed understanding of PT recommendation and is agreeable to SNF placement at time of discharge. Patient reports preference for a facility in [place] or [place] . CSW discussed insurance authorization process and provided the daughter with the website to view the Medicare SNF ratings list. CSW will send out referrals for review and provide bed offers as available.   Skilled Nursing Rehab Facilities-   [email or web address]   Ratings out of 5 stars (5 the highest)   Name Address  Phone # Quality Care Staffing Health Inspection Overall  [facility] [address] [phone] 2 1 5 4   [facility] [address] [place] [phone] 4 1 3 2   [facility] [address], [facility] [phone] [facility] [address], [place] [phone] 4 1 3 2   [facility]  [address], [place] [phone] 3 2 5 5   [facility] [address] [facility] [phone] 2 1 2 1   [facility] [address], [place] [phone] 4 1 2 1   [facility] [address] [place] [phone] 2 4 3 3   [address] ([facility]) [address] [place] [phone] 3 2 2 2   [facility] [address] [place], [place] [phone] 1 2 1 1   [facility] [address], [place] [phone] 3 1 1 1   [facility] [address] [place] [phone] 4 3 4 4   [facility] [address] [place] [phone] 3 4 3 3           [facility] [address], [place] [phone]      [facility], [place] [address], [place] [phone] 1 1 2 1   [facility] [address], [employer] [phone] 2 2 4 4   [facility] [address] [phone] 2 1 4 3   [facility] [address] [place] [phone] 3 3 3 3           [address] (no [facility]) [address], [place] [phone] 4 4 5 5   [facility] (No [employer]) [address] [place] [phone] 2 2 4 4   [facility] [address], [place] [phone] 2 1 2 1   [facility] (No [facility]) [address], [place] [phone] 5 5 5 5   [facility] [address] [facility] [phone] 2 3 5 5   [facility] [address], [place] [phone] 2 1 1 1   [facility] [address] [place] [phone] 5 2 5 5   [facility]  [address], [place] [phone] 2 2 2 2   [facility] [address], [place] [phone] 4 2 1 1   [facility] [address] [place], [place] [phone] 2 2 3 3           [facility] [address], [place] [phone] 1 1 1 1   [facility] [address] [place]  [phone] 2 3 3  3  [facility] (No [employer]) [address] [place] [phone] 2 1 3 2   [facility]) [address], [place] [phone] 1 1 1 1   [facility] [address], [place] [phone] 3 2 5 5   [facility] [place] [address], [place] [phone] 2 1 1 1           [facility] [address] [place], [place] [phone] 4 4 5 5   [facility])  [address] [place] [phone] 2 1 2 1   [facility]) [address] [place] [phone]  1 4 3   [facility] [address], [place] [phone] 3 5 4 5   [address] [place], [place] [phone] 3 2 2 2   [facility]) [address] [place] [phone] 2 1 3 2     Expected Discharge Plan: Skilled Nursing Facility Barriers to Discharge: Continued Medical Work up, English as a second language teacher   Patient Goals and CMS Choice            Expected Discharge Plan and Services In-house Referral: Clinical Social Work     Living arrangements for the past 2 months: Single Family Home                                      Prior Living Arrangements/Services Living arrangements for the past 2 months: Single Family Home Lives with:: Self Patient language and need for interpreter reviewed:: Yes        Need for Family Participation in Patient Care: Yes (Comment) Care giver support system in place?: Yes (comment)   Criminal Activity/Legal Involvement Pertinent to Current Situation/Hospitalization: No - Comment as needed  Activities of Daily Living Home Assistive Devices/Equipment: Environmental consultant (specify type) ADL Screening (condition at time of admission) Patient's cognitive ability adequate to safely complete daily activities?: Yes Is the patient deaf or have difficulty hearing?: No Does the patient have difficulty seeing, even when wearing glasses/contacts?: No Does the patient have difficulty concentrating, remembering, or making decisions?: No Patient able to express need for assistance with ADLs?: Yes Does the patient have difficulty dressing or bathing?: No Independently performs ADLs?: Yes (appropriate for developmental age) Does the patient have difficulty walking or climbing stairs?: Yes Weakness of Legs: Both Weakness of Arms/Hands: None  Permission Sought/Granted                  Emotional Assessment Appearance:: Appears stated age Attitude/Demeanor/Rapport: Unable to Assess Affect (typically observed): Unable to Assess Orientation: : Oriented to Self Alcohol / Substance Use: Not Applicable Psych Involvement: No (comment)  Admission diagnosis:  Lactic  acidosis [E87.20] Disorientation [R41.0] AKI (acute kidney injury) (HCC) [N17.9] Sepsis secondary to UTI (HCC) [A41.9, N39.0] Patient Active Problem List   Diagnosis Date Noted   SVT (supraventricular tachycardia) 01/26/2023   Paroxysmal atrial fibrillation (HCC) 01/25/2023   Malnutrition of moderate degree 01/24/2023   Sepsis secondary to UTI (HCC) 01/23/2023   Vaginal dryness 11/16/2022   Normal breast exam 11/16/2022   Vaginal discharge 11/16/2022   Vulvar itching 11/16/2022   Screening examination for STD (sexually transmitted disease) 11/16/2022   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   Type 2 diabetes mellitus without complication, without long-term current use of insulin (HCC) 10/06/2022   Hyperthyroidism 10/05/2022   Pain in joint of right knee 06/20/2022   KNEE, ARTHRITIS, DEGEN./OSTEO 12/16/2007  DERANGEMENT MENISCUS 12/16/2007   HLD (hyperlipidemia) 12/18/2006   HYPERLIPIDEMIA 05/07/2006   OBESITY NOS 05/07/2006   ANEMIA-NOS 05/07/2006   CARPAL TUNNEL SYNDROME 05/07/2006   Essential hypertension, benign 05/07/2006   PCP:  Hillery Aldo, NP Pharmacy:   [phone] [place] - [address] AT [facility] [address] [place] [address] Phone: [phone] Fax: [phone]     Social Determinants of Health (SDOH) Social History: SDOH Screenings   Food Insecurity: No Food Insecurity (01/24/2023)  Housing: Low Risk  (01/24/2023)  Transportation Needs: No Transportation Needs (01/24/2023)  Utilities: Not At Risk (01/24/2023)  Alcohol Screen: Low Risk  (11/16/2022)  Depression (PHQ2-9): Medium Risk (11/16/2022)  Financial Resource Strain: Low Risk  (11/16/2022)  Physical Activity: Inactive (11/16/2022)  Social Connections: Moderately Integrated (11/16/2022)  Stress: No Stress Concern Present (11/16/2022)  Tobacco Use: Low Risk  (01/23/2023)   SDOH Interventions:     Readmission Risk Interventions     No data to display

## 2023-01-27 NOTE — Progress Notes (Signed)
Rounding Note    Patient Name: Samantha [name] Date of Encounter: 01/27/2023  [facility] Cardiologist: Samantha Haws, MD   Subjective   No CP or dyspnea; somnolent  Inpatient Medications    Scheduled Meds:  aspirin EC  81 mg Oral Daily   feeding supplement (PROSource TF20)  60 mL Per Tube Daily   free water  300 mL Per Tube Q4H   insulin aspart  0-9 Units Subcutaneous Q4H   insulin glargine-yfgn  8 Units Subcutaneous Daily   methIMAzole  10 mg Per Tube BID   metoprolol tartrate  25 mg Per Tube BID   Continuous Infusions:  cefTRIAXone (ROCEPHIN)  IV 1 g (01/26/23 1616)   diltiazem (CARDIZEM) infusion 5 mg/hr (01/27/23 0451)   feeding supplement (OSMOLITE 1.2 CAL) 40 mL/hr at 01/27/23 0830   heparin 1,100 Units/hr (01/27/23 0833)   PRN Meds: acetaminophen **OR** acetaminophen, albuterol, dextrose, melatonin, ondansetron **OR** ondansetron (ZOFRAN) IV   Vital Signs    Vitals:   01/27/23 0930 01/27/23 0937 01/27/23 1000 01/27/23 1147  BP:   124/64 127/63  Pulse: (!) 105 98 98 94  Resp: 20 17 16 20   Temp:    98.7 F (37.1 C)  TempSrc:    Oral  SpO2: 100% 100% 100% 100%  Weight:      Height:        Intake/Output Summary (Last 24 hours) at 01/27/2023 1252 Last data filed at 01/27/2023 0945 Gross per 24 hour  Intake 150 ml  Output --  Net 150 ml      01/27/2023    4:27 AM 01/26/2023    5:00 AM 01/23/2023   11:00 PM  Last 3 Weights  Weight (lbs) 141 lb 8.6 oz 141 lb 1.5 oz 140 lb 3.4 oz  Weight (kg) 64.2 kg 64 kg 63.6 kg      Telemetry    Sinus rhythm with runs of SVT.- Personally Reviewed  Physical Exam   GEN: No acute distress.   Neck: No JVD Cardiac: Regular rate and rhythm Respiratory: Clear to auscultation bilaterally. GI: Soft, nontender, non-distended  MS: No edema; No deformity. Neuro:  Nonfocal  Psych: Normal affect   Labs    High Sensitivity Troponin:   Recent Labs  Lab 01/23/23 1319 01/23/23 1505 01/23/23 1851  TROPONINIHS  152* 139* 115*     Chemistry Recent Labs  Lab 01/23/23 1319 01/23/23 1851 01/24/23 0142 01/24/23 1433 01/25/23 1952 01/26/23 0547 01/26/23 1403 01/27/23 0722  NA 155*   < > 160*   < > 156* 156* 148* 152*  K 3.6   < > 3.7   < > 3.6 3.6 3.6 3.6  CL 118*   < > 128*   < > 127* 128* 121* 120*  CO2 13*   < > 13*   < > 19* 20* 18* 19*  GLUCOSE 297*   < > 174*   < > 200* 178* 332* 242*  BUN 31*   < > 31*   < > 14 13 12 14   CREATININE 1.98*   < > 1.69*   < > 0.94 1.16* 0.98 0.90  CALCIUM 12.4*   < > 11.0*   < > 10.4* 10.6* 9.8 9.8  MG  --   --   --    < > 1.6* 2.0  --  1.8  PROT 7.7  --  6.3*  --   --   --   --   --   ALBUMIN 3.3*  --  2.6*  --   --   --   --   --   AST 23  --  17  --   --   --   --   --   ALT 20  --  17  --   --   --   --   --   ALKPHOS 88  --  70  --   --   --   --   --   BILITOT 1.0  --  0.9  --   --   --   --   --   GFRNONAA 26*   < > 31*   < > >60 49* 60* >60  ANIONGAP 24*   < > 19*   < > 10 8 9 13    < > = values in this interval not displayed.     Hematology Recent Labs  Lab 01/25/23 0816 01/26/23 0547 01/27/23 0722  WBC 10.1 9.2 8.4  RBC 4.53 3.96 3.79*  HGB 11.2* 9.8* 9.7*  HCT 38.1 32.5* 31.8*  MCV 84.1 82.1 83.9  MCH 24.7* 24.7* 25.6*  MCHC 29.4* 30.2 30.5  RDW 16.6* 16.6* 16.9*  PLT 209 196 166   Thyroid  Recent Labs  Lab 01/23/23 1319 01/23/23 1851  TSH 0.028*  --   FREET4  --  4.47*    BNP Recent Labs  Lab 01/23/23 1851  BNP 112.5*    Radiology    DG Abd Portable 1V  Result Date: 01/26/2023 CLINICAL DATA:  Status post enteric tube placement. EXAM: PORTABLE ABDOMEN - 1 VIEW COMPARISON:  01/23/2023 FINDINGS: Interval placement of a weighted feeding tube. The tip is well below the level of the GE junction projecting over the right upper quadrant of the abdomen. This is either in the distal stomach or proximal duodenum. No dilated bowel loops identified. IMPRESSION: Weighted feeding tube tip is well below the level of the GE  junction projecting over the right upper quadrant of the abdomen. Electronically Signed   By: Signa Kell M.D.   On: 01/26/2023 15:51      Patient Profile     [name] is a 64F with hypertension, hyperlipidemia, diabetes and hyperthyroidism s/p recent ablation admitted with metabolic encephalopathy and profound hypernatremia and metabolic derangements.  Cardiology consulted for new onset atrial fibrillation with RVR.   Echocardiogram August 2024 showed hyperdynamic LV function, LVOT gradient of 4 m/s, grade 1 diastolic dysfunction.  Assessment & Plan    1 paroxysmal atrial fibrillation-patient has converted to sinus rhythm.  Continue metoprolol and Cardizem.  Can transition to oral Cardizem later.  Continue IV heparin.  Will transition to apixaban later.  2 hypertension-blood pressure controlled.  Continue present medical regimen.  Other issues per primary care.  For questions or updates, please contact [facility] Please consult [email or web address] for contact info under        Signed, Samantha Millers, MD  01/27/2023, 12:52 PM

## 2023-01-27 NOTE — Progress Notes (Signed)
ANTICOAGULATION CONSULT NOTE   Pharmacy Consult for Heparin Indication: atrial fibrillation  No Known Allergies  Patient Measurements: Height: 5\' 1"  (154.9 cm) Weight: 64.2 kg (141 lb 8.6 oz) IBW/kg (Calculated) : 47.8 Heparin Dosing Weight: 64 kg  Vital Signs: Temp: 98 F (36.7 C) (08/17 1600) Temp Source: Oral (08/17 1600) BP: 128/57 (08/17 1600) Pulse Rate: 94 (08/17 1147)  Labs: Recent Labs    01/25/23 0816 01/25/23 1457 01/26/23 0547 01/26/23 1403 01/27/23 0722 01/27/23 1725  HGB 11.2*  --  9.8*  --  9.7*  --   HCT 38.1  --  32.5*  --  31.8*  --   PLT 209  --  196  --  166  --   HEPARINUNFRC  --    < > 0.30  --  0.28* 0.42  CREATININE  --    < > 1.16* 0.98 0.90  --    < > = values in this interval not displayed.    Estimated Creatinine Clearance: 45.7 mL/min (by C-G formula based on SCr of 0.9 mg/dL).  Assessment: 77 yo F admitted on 8/13 due to increased AMS and flank pain found to new onset Afib RVR on 8/15. No anticoagulation prior to admission. Initially received SQ heparin for VTE prophylaxis, last given 8/15 at 0525. Pharmacy consulted for IV heparin.   0.42 heparin level, therapeutic  Heparin running appropriately. No overt s/sx of bleeding.   Goal of Therapy:  Heparin level 0.3-0.7 units/ml Monitor platelets by anticoagulation protocol: Yes   Plan:  Continue heparin drip at 1100 units/hr Monitor for s/sx of bleeding Daily heparin level and CBC F/u plans for transition to apixaban per cardiology note  Thank you for allowing pharmacy to participate in this patient's care.  Marja Kays, PharmD Emergency Medicine Clinical Pharmacist 01/27/2023,7:39 PM

## 2023-01-27 NOTE — [place] FL2 (Signed)
[place] MEDICAID FL2 LEVEL OF CARE FORM     IDENTIFICATION  Patient Name: Samantha [name] Birthdate: [birth year] Sex: female Admission Date (Current Location): 01/23/2023  [facility] and [place] Number:  Producer, television/film/video and Address:  [facility], [address] [place], [place] [address]      Provider Number: [id number]  Attending Physician Name and Address:  Maretta Bees, MD  Relative Name and Phone Number:  Rakeshia, Sewell (Daughter)  [phone]    Current Level of Care: Hospital Recommended Level of Care: Skilled Nursing Facility Prior Approval Number:    Date Approved/Denied:   PASRR Number: [id number]  Discharge Plan: SNF    Current Diagnoses: Patient Active Problem List   Diagnosis Date Noted   SVT (supraventricular tachycardia) 01/26/2023   Paroxysmal atrial fibrillation (HCC) 01/25/2023   Malnutrition of moderate degree 01/24/2023   Sepsis secondary to UTI (HCC) 01/23/2023   Vaginal dryness 11/16/2022   Normal breast exam 11/16/2022   Vaginal discharge 11/16/2022   Vulvar itching 11/16/2022   Screening examination for STD (sexually transmitted disease) 11/16/2022   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   Type 2 diabetes mellitus without complication, without long-term current use of insulin (HCC) 10/06/2022   Hyperthyroidism 10/05/2022   Pain in joint of right knee 06/20/2022   KNEE, ARTHRITIS, DEGEN./OSTEO 12/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   DERANGEMENT MENISCUS 12/16/2007   HLD (hyperlipidemia) 12/18/2006   HYPERLIPIDEMIA 05/07/2006   OBESITY NOS 05/07/2006   ANEMIA-NOS 05/07/2006   CARPAL TUNNEL SYNDROME 05/07/2006   Essential hypertension, benign 05/07/2006    Orientation RESPIRATION BLADDER Height & Weight     Self  Normal Incontinent Weight: 141 lb 8.6 oz (64.2 kg) Height:  5\' 1"  (154.9 cm)  BEHAVIORAL SYMPTOMS/MOOD NEUROLOGICAL BOWEL NUTRITION STATUS      Incontinent Diet (see d/c med list)  AMBULATORY STATUS COMMUNICATION OF NEEDS  Skin   Extensive Assist Verbally Normal                       Personal Care Assistance Level of Assistance  Bathing, Feeding, Dressing Bathing Assistance: Limited assistance Feeding assistance: Limited assistance Dressing Assistance: Limited assistance     Functional Limitations Info  Sight, Hearing, Speech Sight Info: Impaired Hearing Info: Adequate Speech Info: Adequate    SPECIAL CARE FACTORS FREQUENCY  PT (By licensed PT), OT (By licensed OT)     PT Frequency: 5x/ week OT Frequency: 5x/ week            Contractures Contractures Info: Not present    Additional Factors Info  Code Status, Allergies, Insulin Sliding Scale Code Status Info: FULL Allergies Info: NKA   Insulin Sliding Scale Info: see d/c med list       Current Medications (01/27/2023):  This is the current hospital active medication list Current Facility-Administered Medications  Medication Dose Route Frequency Provider Last Rate Last Admin   acetaminophen (TYLENOL) tablet 650 mg  650 mg Oral Q6H PRN Lurline Del, MD       Or   acetaminophen (TYLENOL) suppository 650 mg  650 mg Rectal Q6H PRN Lurline Del, MD       albuterol (PROVENTIL) (2.5 MG/3ML) 0.083% nebulizer solution 2.5 mg  2.5 mg Nebulization Q2H PRN Lurline Del, MD       aspirin EC tablet 81 mg  81 mg Oral Daily Skip Mayer A, MD   81 mg at 01/27/23 0930   cefTRIAXone (ROCEPHIN) 1 g in sodium chloride 0.9 %  100 mL IVPB  1 g Intravenous Q24H Long, Arlyss Repress, MD 200 mL/hr at 01/26/23 1616 1 g at 01/26/23 1616   dextrose 50 % solution 0-50 mL  0-50 mL Intravenous PRN Maretta Bees, MD       diltiazem (CARDIZEM) 125 mg in dextrose 5% 125 mL (1 mg/mL) infusion  5-20 mg/hr Intravenous Titrated Marjie Skiff E, PA-C 5 mL/hr at 01/27/23 0451 5 mg/hr at 01/27/23 0451   feeding supplement (OSMOLITE 1.2 CAL) liquid 1,000 mL  1,000 mL Per Tube Continuous Maretta Bees, MD 40 mL/hr at 01/27/23 0830 Rate Change  at 01/27/23 0830   feeding supplement (PROSource TF20) liquid 60 mL  60 mL Per Tube Daily Maretta Bees, MD   60 mL at 01/27/23 0928   free water 300 mL  300 mL Per Tube Q4H Ghimire, Shanker M, MD   300 mL at 01/27/23 0930   heparin ADULT infusion 100 units/mL (25000 units/219mL)  1,100 Units/hr Intravenous Continuous Merla Riches, RPH 11 mL/hr at 01/27/23 0833 1,100 Units/hr at 01/27/23 0833   insulin aspart (novoLOG) injection 0-9 Units  0-9 Units Subcutaneous Q4H Maretta Bees, MD   3 Units at 01/27/23 0831   insulin glargine-yfgn [facility]) injection 8 Units  8 Units Subcutaneous Daily Maretta Bees, MD   8 Units at 01/27/23 0930   magnesium sulfate IVPB 2 g 50 mL  2 g Intravenous Once Maretta Bees, MD 50 mL/hr at 01/27/23 0928 2 g at 01/27/23 2130   melatonin tablet 3 mg  3 mg Oral QHS PRN Howerter, Justin B, DO       methimazole (TAPAZOLE) tablet 10 mg  10 mg Per Tube BID Maretta Bees, MD   10 mg at 01/27/23 0930   metoprolol tartrate (LOPRESSOR) 25 mg/10 mL oral suspension 25 mg  25 mg Per Tube BID Chilton Si, MD   25 mg at 01/27/23 0929   ondansetron (ZOFRAN) tablet 4 mg  4 mg Oral Q6H PRN Lurline Del, MD       Or   ondansetron [facility]) injection 4 mg  4 mg Intravenous Q6H PRN Lurline Del, MD         Discharge Medications: Please see discharge summary for a list of discharge medications.  Relevant Imaging Results:  Relevant Lab Results:   Additional Information SSN: [id number]  Shalese Strahan F Challen Spainhour, LCSW

## 2023-01-28 DIAGNOSIS — I480 Paroxysmal atrial fibrillation: Secondary | ICD-10-CM | POA: Diagnosis not present

## 2023-01-28 DIAGNOSIS — A419 Sepsis, unspecified organism: Secondary | ICD-10-CM | POA: Diagnosis not present

## 2023-01-28 DIAGNOSIS — I471 Supraventricular tachycardia, unspecified: Secondary | ICD-10-CM | POA: Diagnosis not present

## 2023-01-28 DIAGNOSIS — N390 Urinary tract infection, site not specified: Secondary | ICD-10-CM | POA: Diagnosis not present

## 2023-01-28 LAB — CBC
HCT: 31.6 % — ABNORMAL LOW (ref 36.0–46.0)
Hemoglobin: 9.5 g/dL — ABNORMAL LOW (ref 12.0–15.0)
MCH: 25.4 pg — ABNORMAL LOW (ref 26.0–34.0)
MCHC: 30.1 g/dL (ref 30.0–36.0)
MCV: 84.5 fL (ref 80.0–100.0)
Platelets: 159 10*3/uL (ref 150–400)
RBC: 3.74 MIL/uL — ABNORMAL LOW (ref 3.87–5.11)
RDW: 16.9 % — ABNORMAL HIGH (ref 11.5–15.5)
WBC: 7.3 10*3/uL (ref 4.0–10.5)
nRBC: 0.4 % — ABNORMAL HIGH (ref 0.0–0.2)

## 2023-01-28 LAB — GLUCOSE, CAPILLARY
Glucose-Capillary: 203 mg/dL — ABNORMAL HIGH (ref 70–99)
Glucose-Capillary: 255 mg/dL — ABNORMAL HIGH (ref 70–99)
Glucose-Capillary: 262 mg/dL — ABNORMAL HIGH (ref 70–99)
Glucose-Capillary: 208 mg/dL — ABNORMAL HIGH (ref 70–99)
Glucose-Capillary: 215 mg/dL — ABNORMAL HIGH (ref 70–99)

## 2023-01-28 LAB — BASIC METABOLIC PANEL
Anion gap: 6 (ref 5–15)
BUN: 13 mg/dL (ref 8–23)
CO2: 20 mmol/L — ABNORMAL LOW (ref 22–32)
Calcium: 9.4 mg/dL (ref 8.9–10.3)
Chloride: 124 mmol/L — ABNORMAL HIGH (ref 98–111)
Creatinine, Ser: 0.83 mg/dL (ref 0.44–1.00)
GFR, Estimated: >60 mL/min (ref >60)
Glucose, Bld: 244 mg/dL — ABNORMAL HIGH (ref 70–99)
Potassium: 4 mmol/L (ref 3.5–5.1)
Sodium: 150 mmol/L — ABNORMAL HIGH (ref 135–145)

## 2023-01-28 LAB — MAGNESIUM: Magnesium: 2 mg/dL (ref 1.7–2.4)

## 2023-01-28 LAB — HEPARIN LEVEL (UNFRACTIONATED): Heparin Unfractionated: 0.47 [IU]/mL (ref 0.30–0.70)

## 2023-01-28 LAB — PHOSPHORUS: Phosphorus: 2.4 mg/dL — ABNORMAL LOW (ref 2.5–4.6)

## 2023-01-28 MED ORDER — POTASSIUM PHOSPHATES 15 MMOLE/5ML IV SOLN
30.0000 mmol | Freq: Once | INTRAVENOUS | Status: DC
Start: 2023-01-28 — End: 2023-01-28

## 2023-01-28 MED ORDER — LOPERAMIDE HCL 1 MG/7.5ML PO SUSP
2.0000 mg | ORAL | Status: DC | PRN
  Administered 2023-01-28: 2 mg
  Filled 2023-01-28 (×2): qty 15

## 2023-01-28 MED ORDER — K PHOS MONO-SOD PHOS DI & MONO 155-852-130 MG PO TABS
250.0000 mg | ORAL_TABLET | Freq: Three times a day (TID) | ORAL | Status: AC
  Administered 2023-01-28 (×3): 250 mg
  Filled 2023-01-28 (×3): qty 1

## 2023-01-28 MED ORDER — FREE WATER
500.0000 mL | Status: DC
  Administered 2023-01-28 – 2023-01-29 (×6): 500 mL

## 2023-01-28 NOTE — Plan of Care (Signed)
  Problem: Education: Goal: Ability to describe self-care measures that may prevent or decrease complications (Diabetes Survival Skills Education) will improve Outcome: Progressing Goal: Individualized Educational Video(s) Outcome: Progressing   Problem: Coping: Goal: Ability to adjust to condition or change in health will improve Outcome: Progressing   Problem: Fluid Volume: Goal: Ability to maintain a balanced intake and output will improve Outcome: Progressing   Problem: Health Behavior/Discharge Planning: Goal: Ability to identify and utilize available resources and services will improve Outcome: Progressing Goal: Ability to manage health-related needs will improve Outcome: Progressing   Problem: Metabolic: Goal: Ability to maintain appropriate glucose levels will improve Outcome: Progressing   Problem: Nutritional: Goal: Maintenance of adequate nutrition will improve Outcome: Progressing Goal: Progress toward achieving an optimal weight will improve Outcome: Progressing   Problem: Skin Integrity: Goal: Risk for impaired skin integrity will decrease Outcome: Progressing   Problem: Tissue Perfusion: Goal: Adequacy of tissue perfusion will improve Outcome: Progressing   Problem: Education: Goal: Knowledge of General Education information will improve Description: Including pain rating scale, medication(s)/side effects and non-pharmacologic comfort measures Outcome: Progressing   Problem: Health Behavior/Discharge Planning: Goal: Ability to manage health-related needs will improve Outcome: Progressing   Problem: Clinical Measurements: Goal: Ability to maintain clinical measurements within normal limits will improve Outcome: Progressing Goal: Will remain free from infection Outcome: Progressing Goal: Diagnostic test results will improve Outcome: Progressing Goal: Respiratory complications will improve Outcome: Progressing Goal: Cardiovascular complication will  be avoided Outcome: Progressing   Problem: Activity: Goal: Risk for activity intolerance will decrease Outcome: Progressing   Problem: Nutrition: Goal: Adequate nutrition will be maintained Outcome: Progressing   Problem: Coping: Goal: Level of anxiety will decrease Outcome: Progressing   Problem: Elimination: Goal: Will not experience complications related to bowel motility Outcome: Progressing Goal: Will not experience complications related to urinary retention Outcome: Progressing   Problem: Pain Managment: Goal: General experience of comfort will improve Outcome: Progressing   Problem: Safety: Goal: Ability to remain free from injury will improve Outcome: Progressing   Problem: Skin Integrity: Goal: Risk for impaired skin integrity will decrease Outcome: Progressing   Problem: Education: Goal: Ability to describe self-care measures that may prevent or decrease complications (Diabetes Survival Skills Education) will improve Outcome: Progressing Goal: Individualized Educational Video(s) Outcome: Progressing   Problem: Cardiac: Goal: Ability to maintain an adequate cardiac output will improve Outcome: Progressing   Problem: Health Behavior/Discharge Planning: Goal: Ability to identify and utilize available resources and services will improve Outcome: Progressing Goal: Ability to manage health-related needs will improve Outcome: Progressing   Problem: Fluid Volume: Goal: Ability to achieve a balanced intake and output will improve Outcome: Progressing   Problem: Metabolic: Goal: Ability to maintain appropriate glucose levels will improve Outcome: Progressing   Problem: Nutritional: Goal: Maintenance of adequate nutrition will improve Outcome: Progressing Goal: Maintenance of adequate weight for body size and type will improve Outcome: Progressing   Problem: Respiratory: Goal: Will regain and/or maintain adequate ventilation Outcome: Progressing    Problem: Urinary Elimination: Goal: Ability to achieve and maintain adequate renal perfusion and functioning will improve Outcome: Progressing   

## 2023-01-28 NOTE — Progress Notes (Signed)
PROGRESS NOTE        PATIENT DETAILS Name: Samantha [name] Age: 77 y.o. Sex: female Date of Birth: [birth year] Admit Date: 01/23/2023 Admitting Physician Lurline Del, MD ZOX:[id number], Devonne Doughty, NP  Brief Summary: Patient is a 77 y.o.  female with history of DM-2, HTN, hyperthyroidism-s/p RAI radioactive iodine ablation 06/24-who was apparently diagnosed with a UTI-initially on Bactrim-then changed to Augmentin-presented with altered mental status x 2-3 days along with very poor oral intake.  Patient was found to have AKI/metabolic acidosis and hypernatremia-and subsequently admitted to the hospitalist service.  Significant events: 8/13>> admit to [facility] 8/14>> started insulin GTT for euglycemic DKA 8/15>> A-fib RVR-no response to IV Lopressor, started Cardizem infusion-cardiology consult. 8/16>> more awake/alert compared to past few days-maintaining sinus rhythm-failed SLP eval-Cortrak ordered  Significant studies: 8/13>> CT head: No acute intracranial abnormality 8/13>> CXR: No obvious PNA 8/13>> renal ultrasound: No hydronephrosis 8/15>> echo: EF> 75%.  Significant microbiology data: 7/25>> urine culture: Klebsiella 8/13>> COVID PCR: Negative 8/13>> blood cultures: Negative 8/14>> urine culture: Negative  Procedures: None  Consults: None  Subjective: No major issues overnight-daughter at bedside-acknowledges that she has been talking with her mother-patient was sleeping but easily arousable-conversing-awake/alert-following commands.  Still appears weak/deconditioned.  Objective: Vitals: Blood pressure (!) 151/60, pulse (!) 103, temperature 98.1 F (36.7 C), temperature source Oral, resp. rate (!) 21, height 5\' 1"  (1.549 m), weight 64.2 kg, SpO2 100%.   Exam: Gen Exam:Alert awake-not in any distress HEENT:atraumatic, normocephalic Chest: B/L clear to auscultation anteriorly CVS:S1S2 regular Abdomen:soft non tender, non distended Extremities:no  edema Neurology: Non focal but generalized weakness. Skin: no rash  Pertinent Labs/Radiology:    Latest Ref Rng & Units 01/28/2023    4:23 AM 01/27/2023    7:22 AM 01/26/2023    5:47 AM  CBC  WBC 4.0 - 10.5 K/uL 7.3  8.4  9.2   Hemoglobin 12.0 - 15.0 g/dL 9.5  9.7  9.8   Hematocrit 36.0 - 46.0 % 31.6  31.8  32.5   Platelets 150 - 400 K/uL 159  166  196     Lab Results  Component Value Date   NA 150 (H) 01/28/2023   K 4.0 01/28/2023   CL 124 (H) 01/28/2023   CO2 20 (L) 01/28/2023      Assessment/Plan: Acute metabolic encephalopathy Likely due to hypernatremia/AKI/possible euglycemic DKA Encephalopathy has essentially resolved after treatment of the underlying etiologies-she is much more awake and alert but still weak and lethargic at times. Continue supportive care.    Paroxysmal atrial fibrillation with RVR Occurred on 8/15-rates in the 180s-190s at times-subsequently converted back to sinus rhythm Maintaining sinus rhythm-cardiology following-remains on Cardizem infusion/beta-blockers and IV heparin Await further recommendations from cardiology.  AKI Likely hemodynamically mediated (poor oral intake and lisinopril use), euglycemic DKA and recent Bactrim use Resolved with IVF and treatment of underlying euglycemic DKA.  Hypernatremia Secondary to poor oral intake in the setting of recent UTI Sodium levels continue to improve-but still on the higher side-increasing free water through NG tube today.  Euglycemic DKA from Jardiance use Resolved with IV insulin infusion Has been transitioned to SQ insulin Would not rechallenge with Jardiance in the future.  DM-2 (A1c 9.8 on 8/30) CBGs on the higher side Will get first dose of 12 units of Semglee today-SSI scale changed to moderate Reassess 8/19.  Recent Labs    01/27/23 2315 01/28/23 0339 01/28/23 0918  GLUCAP 258* 203* 255*     Hypercalcemia Suspect this is mostly due to  dehydration/hemoconcentration Resolved with IVF.  Hypophosphatemia Replete/recheck  Hypokalemia Repleted.  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 Nida-endocrinology on 8/16-recommendations were to start Tapazole. Already on beta-blocker Follow-up with endocrine as an outpatient  HTN BP stable-continue metoprolol  Oropharyngeal dysphagia Likely due to debility/deconditioning in the setting of acute/critical illness Although more awake and alert-per SLP-not yet ready for oral intake-Cortrak tube placed on 8/16-tolerating tube feeds well-hopefully this will be short-term and we could get it removed over the next several days.   Debility/deconditioning Due to critical illness PT/OT eval Suspect will require SNF sometime next week  Nutrition Status: Nutrition Problem: Moderate Malnutrition Etiology: chronic illness Signs/Symptoms: percent weight loss, mild fat depletion, moderate muscle depletion Percent weight loss: 22 % Interventions: Tube feeding    BMI: Estimated body mass index is 26.74 kg/m as calculated from the following:   Height as of this encounter: 5\' 1"  (1.549 m).   Weight as of this encounter: 64.2 kg.   Code status:   Code Status: Full Code   DVT Prophylaxis: SCDs Start: 01/25/23 0805   Family Communication: Daughter at bedside.  Disposition Plan: Status is: Inpatient Remains inpatient appropriate because: Severity of illness   Planned Discharge Destination: SNF   Diet: Diet Order             Diet NPO time specified  Diet effective now                      Antimicrobial agents: Anti-infectives (From admission, onward)    Start     Dose/Rate Route Frequency Ordered Stop   01/24/23 1500  cefTRIAXone (ROCEPHIN) 1 g in sodium chloride 0.9 % 100 mL IVPB        1 g 200 mL/hr over 30 Minutes Intravenous Every 24 hours 01/23/23 1836 01/27/23 1511   01/23/23 1430  cefTRIAXone (ROCEPHIN) 2 g in sodium chloride 0.9 % 100 mL IVPB  Status:  Discontinued        2 g 200 mL/hr over 30 Minutes Intravenous Every 24 hours 01/23/23 1417 01/23/23 1835        MEDICATIONS: Scheduled Meds:  aspirin EC  81 mg Oral Daily   feeding supplement (PROSource TF20)  60 mL Per Tube Daily   free water  500 mL Per Tube Q4H   insulin aspart  0-15 Units Subcutaneous Q4H   insulin glargine-yfgn  12 Units Subcutaneous Daily   methIMAzole  10 mg Per Tube BID   metoprolol tartrate  25 mg Per Tube BID   phosphorus  250 mg Per Tube TID   Continuous Infusions:  diltiazem (CARDIZEM) infusion 5 mg/hr (01/28/23 0955)   feeding supplement (OSMOLITE 1.2 CAL) 1,000 mL (01/28/23 1007)   heparin 1,100 Units/hr (01/27/23 1448)   PRN Meds:.acetaminophen **OR** acetaminophen, albuterol, dextrose, melatonin, ondansetron **OR** ondansetron (ZOFRAN) IV   I have personally reviewed following labs and imaging studies  LABORATORY DATA: CBC: Recent Labs  Lab 01/23/23 1319 01/23/23 1851 01/24/23 0142 01/25/23 6045 01/26/23 0547 01/27/23 4098 01/28/23 0423  WBC 11.7*   < > 12.9* 10.1 9.2 8.4 7.3  NEUTROABS 9.8*  --   --   --   --   --   --   HGB 13.2   < > 11.2* 11.2* 9.8* 9.7* 9.5*  HCT 45.4   < > 38.5 38.1 32.5* 31.8* 31.6*  MCV 85.0   < > 85.2 84.1 82.1 83.9 84.5  PLT 397   < > 295 209 196 166 159   < > = values in this interval not displayed.    Basic Metabolic Panel: Recent Labs  Lab 01/25/23 1457 01/25/23 1952 01/26/23 0547 01/26/23 1403 01/27/23 0722 01/28/23 0423  NA 159* 156* 156* 148* 152* 150*  K 3.4* 3.6 3.6 3.6 3.6 4.0  CL >130* 127* 128* 121*  120* 124*  CO2 17* 19* 20* 18* 19* 20*  GLUCOSE 242* 200* 178* 332* 242* 244*  BUN 15 14 13 12 14 13   CREATININE 1.02* 0.94 1.16* 0.98 0.90 0.83  CALCIUM 10.4* 10.4* 10.6* 9.8 9.8 9.4  MG 1.6* 1.6* 2.0  --  1.8 2.0  PHOS  --   --  1.5* 2.9 2.8 2.4*    GFR: Estimated Creatinine Clearance: 49.5 mL/min (by C-G formula based on SCr of 0.83 mg/dL).  Liver Function Tests: Recent Labs  Lab 01/23/23 1319 01/24/23 0142  AST 23 17  ALT 20 17  ALKPHOS 88 70  BILITOT 1.0 0.9  PROT 7.7 6.3*  ALBUMIN 3.3* 2.6*   Recent Labs  Lab 01/23/23 1319  LIPASE 338*   No results for input(s): "AMMONIA" in the last 168 hours.  Coagulation Profile: No results for input(s): "INR", "PROTIME" in the last 168 hours.  Cardiac Enzymes: No results for input(s): "CKTOTAL", "CKMB", "CKMBINDEX", "TROPONINI" in the last 168 hours.  BNP (last 3 results) No results for input(s): "PROBNP" in the last 8760 hours.  Lipid Profile: No results for input(s): "CHOL", "HDL", "LDLCALC", "TRIG", "CHOLHDL", "LDLDIRECT" in the last 72 hours.  Thyroid Function Tests: No results for input(s): "TSH", "T4TOTAL", "FREET4", "T3FREE", "THYROIDAB" in the last 72 hours.   Anemia Panel: No results for input(s): "VITAMINB12", "FOLATE", "FERRITIN", "TIBC", "IRON", "RETICCTPCT" in the last 72 hours.  Urine analysis:    Component Value Date/Time   COLORURINE YELLOW 01/24/2023 0336   APPEARANCEUR HAZY (A) 01/24/2023 0336   LABSPEC 1.018 01/24/2023 0336   PHURINE 5.0 01/24/2023 0336   GLUCOSEU >=500 (A) 01/24/2023 0336   HGBUR SMALL (A) 01/24/2023 0336   HGBUR negative 11/11/2007 1104   BILIRUBINUR NEGATIVE 01/24/2023 0336   KETONESUR 20 (A) 01/24/2023 0336   PROTEINUR NEGATIVE 01/24/2023 0336   UROBILINOGEN 0.2 11/18/2014 2105   NITRITE NEGATIVE 01/24/2023 0336   LEUKOCYTESUR NEGATIVE 01/24/2023 0336    Sepsis Labs: Lactic Acid, Venous    Component Value Date/Time   LATICACIDVEN 1.9 01/23/2023 1716     MICROBIOLOGY: Recent Results (from the past 240 hour(s))  Resp panel by RT-PCR (RSV, Flu A&B, Covid) Urine, Clean Catch     Status: None   Collection Time: 01/19/23  8:02 PM   Specimen: Urine, Clean Catch; Nasal Swab  Result Value Ref Range Status   SARS Coronavirus 2 by RT PCR NEGATIVE NEGATIVE Final   Influenza A by PCR NEGATIVE NEGATIVE Final   Influenza B by PCR NEGATIVE NEGATIVE Final    Comment: (NOTE) The Xpert Xpress SARS-CoV-2/FLU/RSV plus assay is intended as an aid in the diagnosis of influenza from Nasopharyngeal swab specimens and should not be used as  a sole basis for treatment. Nasal washings and aspirates are unacceptable for Xpert Xpress SARS-CoV-2/FLU/RSV testing.  Fact Sheet for Patients: BloggerCourse.com  Fact Sheet for Healthcare Providers: SeriousBroker.it  This test is not yet approved or cleared by the [place] FDA and has been authorized for detection and/or diagnosis of SARS-CoV-2 by FDA under an Emergency Use Authorization (EUA). This EUA will remain in effect (meaning this test can be used) for the duration of the COVID-19 declaration under Section 564(b)(1) of the Act, 21 U.S.C. section 360bbb-3(b)(1), unless the authorization is terminated or revoked.     Resp Syncytial Virus by PCR NEGATIVE NEGATIVE Final    Comment: (NOTE) Fact Sheet for Patients: BloggerCourse.com  Fact Sheet for Healthcare Providers: SeriousBroker.it  This test is not yet approved or cleared by the [place] FDA and has been authorized for detection and/or diagnosis of SARS-CoV-2 by FDA under an Emergency Use Authorization (EUA). This EUA will remain in effect (meaning this test can be used) for the duration of the COVID-19 declaration under Section 564(b)(1) of the Act, 21 U.S.C. section 360bbb-3(b)(1), unless the authorization is terminated  or revoked.  Performed at [facility], [address], [place], [place] [address]   Culture, blood (routine x 2)     Status: None   Collection Time: 01/23/23  1:00 PM   Specimen: BLOOD  Result Value Ref Range Status   Specimen Description BLOOD RIGHT ANTECUBITAL  Final   Special Requests   Final    BOTTLES DRAWN AEROBIC AND ANAEROBIC Blood Culture results may not be optimal due to an inadequate volume of blood received in culture bottles   Culture   Final    NO GROWTH 5 DAYS Performed at [facility], [address], [place], [place] [address]    Report Status 01/28/2023 FINAL  Final  Culture, blood (routine x 2)     Status: None   Collection Time: 01/23/23  1:19 PM   Specimen: BLOOD  Result Value Ref Range Status   Specimen Description BLOOD BLOOD LEFT ARM  Final   Special Requests   Final    BOTTLES DRAWN AEROBIC AND ANAEROBIC Blood Culture adequate volume   Culture   Final    NO GROWTH 5 DAYS Performed at [facility], [address] [place], [place] [address]    Report Status 01/28/2023 FINAL  Final  SARS Coronavirus 2 by RT PCR (hospital order, performed in [facility] hospital lab) *cepheid single result test* Anterior Nasal Swab     Status: None   Collection Time: 01/23/23  1:19 PM   Specimen: Anterior Nasal Swab  Result Value Ref Range Status   SARS Coronavirus 2 by RT PCR NEGATIVE NEGATIVE Final    Comment: Performed at [facility], [address] [place], [place] [address]  Urine Culture     Status: None   Collection Time: 01/24/23  3:39 AM   Specimen: Urine, Clean Catch  Result Value Ref Range Status   Specimen Description URINE, CLEAN CATCH  Final   Special Requests NONE  Final   Culture   Final    NO GROWTH Performed at [facility], [address] [place], [place] [address]    Report Status 01/25/2023 FINAL  Final    RADIOLOGY STUDIES/RESULTS: DG Abd Portable 1V  Result Date: 01/26/2023 CLINICAL DATA:  Status post  enteric tube placement. EXAM: PORTABLE ABDOMEN - 1 VIEW COMPARISON:  01/23/2023 FINDINGS: Interval placement of  a weighted feeding tube. The tip is well below the level of the GE junction projecting over the right upper quadrant of the abdomen. This is either in the distal stomach or proximal duodenum. No dilated bowel loops identified. IMPRESSION: Weighted feeding tube tip is well below the level of the GE junction projecting over the right upper quadrant of the abdomen. Electronically Signed   By: Signa Kell M.D.   On: 01/26/2023 15:51     LOS: 5 days   Jeoffrey Massed, MD  [facility]    To contact the attending provider between 7A-7P or the covering provider during after hours 7P-7A, please log into the web site [email or web address] and access using universal [place] password for that web site. If you do not have the password, please call the hospital operator.  01/28/2023, 10:51 AM

## 2023-01-28 NOTE — Plan of Care (Signed)
  Problem: Nutritional: Goal: Maintenance of adequate nutrition will improve Outcome: Progressing   

## 2023-01-28 NOTE — Progress Notes (Signed)
   Rounding Note    Patient Name: Samantha [name] Date of Encounter: 01/28/2023  [facility] Cardiologist: Dr Eden Emms  Telemetry reviewed.  Patient remains in sinus rhythm.  Continue metoprolol, Cardizem and heparin; transition to apixaban later.  No change in plans.  Will see again tomorrow.  For questions or updates, please contact [facility] Please consult [email or web address] for contact info under        Signed, Olga Millers, MD  01/28/2023, 9:47 AM

## 2023-01-28 NOTE — Progress Notes (Signed)
ANTICOAGULATION CONSULT NOTE   Pharmacy Consult for Heparin Indication: atrial fibrillation  No Known Allergies  Patient Measurements: Height: 5\' 1"  (154.9 cm) Weight: 64.2 kg (141 lb 8.6 oz) IBW/kg (Calculated) : 47.8 Heparin Dosing Weight: 64 kg  Vital Signs: Temp: 98.1 F (36.7 C) (08/18 0900) Temp Source: Oral (08/18 0900) BP: 151/60 (08/18 1000) Pulse Rate: 103 (08/18 1000)  Labs: Recent Labs    01/26/23 0547 01/26/23 1403 01/27/23 0722 01/27/23 1725 01/28/23 0423  HGB 9.8*  --  9.7*  --  9.5*  HCT 32.5*  --  31.8*  --  31.6*  PLT 196  --  166  --  159  HEPARINUNFRC 0.30  --  0.28* 0.42 0.47  CREATININE 1.16* 0.98 0.90  --  0.83    Estimated Creatinine Clearance: 49.5 mL/min (by C-G formula based on SCr of 0.83 mg/dL).  Assessment: 77 yo F admitted on 8/13 due to increased AMS and flank pain found to new onset Afib RVR on 8/15. No anticoagulation prior to admission. Initially received SQ heparin for VTE prophylaxis, last given 8/15 at 0525. Pharmacy consulted for IV heparin.   0.47 heparin level, therapeutic  Heparin running appropriately. No overt s/sx of bleeding.   Goal of Therapy:  Heparin level 0.3-0.7 units/ml Monitor platelets by anticoagulation protocol: Yes   Plan:  Continue heparin drip at 1100 units/hr Monitor for s/sx of bleeding Daily heparin level and CBC F/u plans for transition to apixaban per cardiology note  Thank you for allowing pharmacy to participate in this patient's care.  Gwynn Burly, PharmD 01/28/2023,10:58 AM

## 2023-01-29 ENCOUNTER — Other Ambulatory Visit (HOSPITAL_COMMUNITY): Payer: Self-pay

## 2023-01-29 DIAGNOSIS — I471 Supraventricular tachycardia, unspecified: Secondary | ICD-10-CM | POA: Diagnosis not present

## 2023-01-29 DIAGNOSIS — E440 Moderate protein-calorie malnutrition: Secondary | ICD-10-CM | POA: Diagnosis not present

## 2023-01-29 DIAGNOSIS — A419 Sepsis, unspecified organism: Secondary | ICD-10-CM | POA: Diagnosis not present

## 2023-01-29 DIAGNOSIS — I480 Paroxysmal atrial fibrillation: Secondary | ICD-10-CM | POA: Diagnosis not present

## 2023-01-29 LAB — BASIC METABOLIC PANEL
Anion gap: 9 (ref 5–15)
BUN: 12 mg/dL (ref 8–23)
CO2: 20 mmol/L — ABNORMAL LOW (ref 22–32)
Calcium: 9 mg/dL (ref 8.9–10.3)
Chloride: 113 mmol/L — ABNORMAL HIGH (ref 98–111)
Creatinine, Ser: 0.76 mg/dL (ref 0.44–1.00)
GFR, Estimated: >60 mL/min (ref >60)
Glucose, Bld: 263 mg/dL — ABNORMAL HIGH (ref 70–99)
Potassium: 3.3 mmol/L — ABNORMAL LOW (ref 3.5–5.1)
Sodium: 142 mmol/L (ref 135–145)

## 2023-01-29 LAB — GLUCOSE, CAPILLARY
Glucose-Capillary: 147 mg/dL — ABNORMAL HIGH (ref 70–99)
Glucose-Capillary: 208 mg/dL — ABNORMAL HIGH (ref 70–99)
Glucose-Capillary: 217 mg/dL — ABNORMAL HIGH (ref 70–99)
Glucose-Capillary: 244 mg/dL — ABNORMAL HIGH (ref 70–99)
Glucose-Capillary: 251 mg/dL — ABNORMAL HIGH (ref 70–99)
Glucose-Capillary: 264 mg/dL — ABNORMAL HIGH (ref 70–99)

## 2023-01-29 LAB — CBC
HCT: 30 % — ABNORMAL LOW (ref 36.0–46.0)
Hemoglobin: 8.8 g/dL — ABNORMAL LOW (ref 12.0–15.0)
MCH: 24.4 pg — ABNORMAL LOW (ref 26.0–34.0)
MCHC: 29.3 g/dL — ABNORMAL LOW (ref 30.0–36.0)
MCV: 83.3 fL (ref 80.0–100.0)
Platelets: 153 10*3/uL (ref 150–400)
RBC: 3.6 MIL/uL — ABNORMAL LOW (ref 3.87–5.11)
RDW: 16.8 % — ABNORMAL HIGH (ref 11.5–15.5)
WBC: 7.2 10*3/uL (ref 4.0–10.5)
nRBC: 0.3 % — ABNORMAL HIGH (ref 0.0–0.2)

## 2023-01-29 LAB — MAGNESIUM: Magnesium: 1.6 mg/dL — ABNORMAL LOW (ref 1.7–2.4)

## 2023-01-29 LAB — HEPARIN LEVEL (UNFRACTIONATED): Heparin Unfractionated: 0.3 [IU]/mL (ref 0.30–0.70)

## 2023-01-29 LAB — PHOSPHORUS: Phosphorus: 3.2 mg/dL (ref 2.5–4.6)

## 2023-01-29 MED ORDER — METOPROLOL TARTRATE 50 MG PO TABS
50.0000 mg | ORAL_TABLET | Freq: Two times a day (BID) | ORAL | Status: DC
  Administered 2023-01-29 – 2023-02-01 (×7): 50 mg via ORAL
  Filled 2023-01-29 (×7): qty 1

## 2023-01-29 MED ORDER — MAGNESIUM SULFATE 4 GM/100ML IV SOLN
4.0000 g | Freq: Once | INTRAVENOUS | Status: AC
  Administered 2023-01-29: 4 g via INTRAVENOUS
  Filled 2023-01-29 (×2): qty 100

## 2023-01-29 MED ORDER — OSMOLITE 1.5 CAL PO LIQD
1000.0000 mL | ORAL | Status: DC
  Filled 2023-01-29: qty 1000

## 2023-01-29 MED ORDER — APIXABAN 5 MG PO TABS
5.0000 mg | ORAL_TABLET | Freq: Two times a day (BID) | ORAL | Status: DC
  Administered 2023-01-29 – 2023-02-01 (×7): 5 mg via ORAL
  Filled 2023-01-29 (×7): qty 1

## 2023-01-29 MED ORDER — METHIMAZOLE 5 MG PO TABS
10.0000 mg | ORAL_TABLET | Freq: Two times a day (BID) | ORAL | Status: DC
  Administered 2023-01-29 – 2023-02-01 (×6): 10 mg via ORAL
  Filled 2023-01-29 (×6): qty 2

## 2023-01-29 MED ORDER — LOPERAMIDE HCL 1 MG/7.5ML PO SUSP: 2.0000 mg | ORAL | Status: DC | PRN

## 2023-01-29 MED ORDER — FREE WATER
200.0000 mL | Freq: Four times a day (QID) | Status: DC
  Administered 2023-01-29: 200 mL

## 2023-01-29 MED ORDER — ENSURE ENLIVE PO LIQD
237.0000 mL | Freq: Two times a day (BID) | ORAL | Status: DC
  Administered 2023-01-29 – 2023-01-31 (×4): 237 mL via ORAL

## 2023-01-29 MED ORDER — POTASSIUM CHLORIDE 20 MEQ PO PACK
40.0000 meq | PACK | ORAL | Status: AC
  Administered 2023-01-29 (×2): 40 meq
  Filled 2023-01-29 (×2): qty 2

## 2023-01-29 MED ORDER — INSULIN ASPART 100 UNIT/ML IJ SOLN
0.0000 [IU] | Freq: Three times a day (TID) | INTRAMUSCULAR | Status: DC
  Administered 2023-01-29: 5 [IU] via SUBCUTANEOUS
  Administered 2023-01-30: 8 [IU] via SUBCUTANEOUS
  Administered 2023-01-30: 3 [IU] via SUBCUTANEOUS
  Administered 2023-01-30: 8 [IU] via SUBCUTANEOUS
  Administered 2023-01-31: 3 [IU] via SUBCUTANEOUS
  Administered 2023-01-31: 5 [IU] via SUBCUTANEOUS
  Administered 2023-01-31: 2 [IU] via SUBCUTANEOUS
  Administered 2023-02-01: 8 [IU] via SUBCUTANEOUS

## 2023-01-29 NOTE — Progress Notes (Signed)
ANTICOAGULATION CONSULT NOTE   Pharmacy Consult for Heparin Indication: atrial fibrillation  No Known Allergies  Patient Measurements: Height: 5\' 1"  (154.9 cm) Weight: 64.2 kg (141 lb 8.6 oz) IBW/kg (Calculated) : 47.8 Heparin Dosing Weight: 64 kg  Vital Signs: Temp: 98.6 F (37 C) (08/19 0310) Temp Source: Oral (08/19 0310) BP: 136/67 (08/19 0310) Pulse Rate: 90 (08/19 0310)  Labs: Recent Labs    01/27/23 0722 01/27/23 1725 01/28/23 0423 01/29/23 0239  HGB 9.7*  --  9.5* 8.8*  HCT 31.8*  --  31.6* 30.0*  PLT 166  --  159 153  HEPARINUNFRC 0.28* 0.42 0.47 0.30  CREATININE 0.90  --  0.83 0.76    Estimated Creatinine Clearance: 51.4 mL/min (by C-G formula based on SCr of 0.76 mg/dL).  Assessment: 77 yo F admitted on 8/13 due to increased AMS and flank pain found to new onset Afib RVR on 8/15. No anticoagulation prior to admission. Initially received SQ heparin for VTE prophylaxis, last given 8/15 at 0525. Pharmacy consulted for IV heparin.   Heparin level therapeutic   Goal of Therapy:  Heparin level 0.3-0.7 units/ml Monitor platelets by anticoagulation protocol: Yes   Plan:  Continue heparin drip at 1100 units/hr Monitor for s/sx of bleeding Daily heparin level and CBC F/u plans for transition to apixaban per cardiology note  Thank you Okey Regal, PharmD 01/29/2023,8:42 AM

## 2023-01-29 NOTE — Progress Notes (Signed)
Dear Doctor:  This patient has been identified as a candidate for PICC for the following reason (s): poor veins/poor circulatory system (CHF, COPD, emphysema, diabetes, steroid use, IV drug abuse, etc.) and incompatible drugs (aminophyllin, TPN, heparin, given with an antibiotic) If you agree, please write an order for the indicated device. For any questions contact the Vascular Access Team at [phone] if no answer, please leave a message.  Thank you for supporting the early vascular access assessment program. 

## 2023-01-29 NOTE — Progress Notes (Signed)
Physical Therapy Treatment Patient Details Name: Samantha [name] MRN: [id number] DOB: 1946-05-22 Today's Date: 01/29/2023   History of Present Illness 77 yo female presenting to ED 8/13 with increased confusion and progressive weakness over the last 24 hours. Found to have sepsis after recent UTI diagnosis. CTH negative. AKI 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, UTI, HTN, T2DM, HLDL, chronic lower back pain, CKDIII    PT Comments  Pt with some improvement in transfer but unable to progress gait due to frequent loose stools when standing.  Participated in multiple transfers with mod A  (for walking would need +2 as pt fatigues easily.) . Pt needs increased time and cueing for mobility.  Pt with slower progress so do recommend that Patient will benefit from continued inpatient follow up therapy, <3 hours/day at d/c.     If plan is discharge home, recommend the following: A lot of help with walking and/or transfers;A lot of help with bathing/dressing/bathroom;Assistance with cooking/housework;Help with stairs or ramp for entrance   Can travel by private vehicle     No  Equipment Recommendations  Rolling walker (2 wheels);Wheelchair cushion (measurements PT);Wheelchair (measurements PT)    Recommendations for Other Services       Precautions / Restrictions Precautions Precautions: Fall     Mobility  Bed Mobility Overal bed mobility: Needs Assistance Bed Mobility: Supine to Sit, Rolling Rolling: Mod assist   Supine to sit: Mod assist     General bed mobility comments: Rolling both directions with cues and increased time and mod A.  Pt had BM and required assist with cleaning prior to OOB activity.  Pt requiring increased cues with mod A to lift trunk to sit    Transfers Overall transfer level: Needs assistance Equipment used: Rolling walker (2 wheels) Transfers: Sit to/from Stand, Bed to chair/wheelchair/BSC Sit to Stand: Mod assist   Step  pivot transfers: +2 safety/equipment, Mod assist       General transfer comment: Sit to stand x 5 during session with cues for hand placement and mod A to rise.  Stand pivot to [facility] with mod A for balance and fatigues easily.  Initial plan to ambulate around bed to chair but eveytime pt stands with loose stools.  On last stand , switched BSC and recliner.  Pt reports only having loose stools when standing so positioned in chair.  Did notify NT that pt in chair and purewick not in place due to loose stools    Ambulation/Gait                   Stairs             Wheelchair Mobility     Tilt Bed    Modified Rankin (Stroke Patients Only)       Balance Overall balance assessment: Needs assistance Sitting-balance support: No upper extremity supported Sitting balance-Leahy Scale: Good     Standing balance support: Bilateral upper extremity supported, Reliant on assistive device for balance Standing balance-Leahy Scale: Poor Standing balance comment: RW and min-mod A                            Cognition Arousal: Alert Behavior During Therapy: WFL for tasks assessed/performed Overall Cognitive Status: Impaired/Different from baseline                               Problem Solving:  Slow processing, Requires verbal cues, Requires tactile cues General Comments: Pt oriented x 3 today and follows basic commands with increased time.        Exercises General Exercises - Lower Extremity Ankle Circles/Pumps: AROM, Both, 10 reps, Supine Quad Sets: AROM, Both, 10 reps, Supine Heel Slides: AROM, Both, 10 reps, Supine    General Comments General comments (skin integrity, edema, etc.): VSS      Pertinent Vitals/Pain Pain Assessment Pain Assessment: No/denies pain    Home Living                          Prior Function            PT Goals (current goals can now be found in the care plan section) Progress towards PT goals:  Progressing toward goals    Frequency    Min 1X/week      PT Plan      Co-evaluation              AM-PAC PT "6 Clicks" Mobility   Outcome Measure  Help needed turning from your back to your side while in a flat bed without using bedrails?: A Lot Help needed moving from lying on your back to sitting on the side of a flat bed without using bedrails?: A Lot Help needed moving to and from a bed to a chair (including a wheelchair)?: A Lot Help needed standing up from a chair using your arms (e.g., wheelchair or bedside chair)?: A Lot Help needed to walk in hospital room?: Total Help needed climbing 3-5 steps with a railing? : Total 6 Click Score: 10    End of Session Equipment Utilized During Treatment: Gait belt Activity Tolerance: Patient limited by fatigue (Limited by frequent loose stools) Patient left: in chair;with call bell/phone within reach;with chair alarm set;with family/visitor present Nurse Communication: Mobility status PT Visit Diagnosis: Unsteadiness on feet (R26.81);Muscle weakness (generalized) (M62.81);Difficulty in walking, not elsewhere classified (R26.2)     Time: 1610-9604 PT Time Calculation (min) (ACUTE ONLY): 41 min  Charges:    $Therapeutic Exercise: 8-22 mins $Therapeutic Activity: 23-37 mins PT General Charges $$ ACUTE PT VISIT: 1 Visit                     Anise Salvo, PT Acute Rehab [facility] [phone]    [name] 01/29/2023, 4:40 PM

## 2023-01-29 NOTE — Progress Notes (Signed)
Speech Language Pathology Treatment: Dysphagia  Patient Details Name: MIRANDA MARMER MRN: [id number] DOB: 30-Jun-1945 Today's Date: 01/29/2023 Time: 6283-1517 SLP Time Calculation (min) (ACUTE ONLY): 19 min  Assessment / Plan / Recommendation Clinical Impression  Pt alert, conversant and appropriate and presents with more of an oral dysphagia. States her tongue is sore, her upper dentures were lost several months ago and that she hasn't been eating much, due to decreased appetite, mostly drinking liquids. Initially mildly delayed transit with applesauce which improved throughout session. There was prolonged mastication, bolus formation and transit with regular texture needing liquid to assist and eventually able to clear oral cavity. No overt s/s aspiration with occasional delayed throat clear which is improvement from prior session. Discussed various texture options and suspect she may not eat much of a puree texture, therefore, initiated pt on Dys 2 (minced-fine chopped) with liquid wash if needed. Recommend she use tongue to remove potential pocketing. Thin liquids, pills whole in puree. ST will continue for efficiency with current diet and upgrade if/when able.    HPI HPI: DAYANNA INGHRAM is a 77 yo female presenting to ED 8/13 with increased confusion and progressive weakness over the last 24 hours. Found to have sepsis after recent UTI diagnosis. CTH negative. CXR revealed shallow inspiration with linear fibrosis or atelectasis in lung bases. 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  Recommendations may be updated based on patient status, additional functional criteria and insurance authorization.    Recommendations   Diet recommendations: Dysphagia 2 (fine chop);Thin liquid Liquids provided via: Cup;Straw Medication Administration: Whole meds with puree Supervision: Patient able to self feed;Intermittent supervision to cue for compensatory strategies Compensations: Slow rate;Small sips/bites;Lingual sweep for clearance of pocketing Postural Changes and/or Swallow Maneuvers: Seated upright 90 degrees                  Oral care BID   Intermittent Supervision/Assistance Dysphagia, oral phase (R13.11)     Continue with current plan of care     Royce Macadamia  01/29/2023, 10:28 AM

## 2023-01-29 NOTE — Plan of Care (Signed)
  Problem: Nutritional: Goal: Maintenance of adequate nutrition will improve Outcome: Progressing   

## 2023-01-29 NOTE — Progress Notes (Signed)
Rounding Note    Patient Name: Samantha [name] Date of Encounter: 01/29/2023  [facility] Cardiologist: Charlton Haws, MD   Subjective   Sleeping comfortably lying fully supine in bed.  Feeling better.  Was able to sit in a chair yesterday.  Was able to stand without support for a few minutes while they were cleaning her up.  Has not yet walked.  No dyspnea.  Occasional brief bursts of atrial tachycardia on monitor, mostly lasting up to 10 beats, 1 episode was 15 seconds long.  Inpatient Medications    Scheduled Meds:  aspirin EC  81 mg Oral Daily   feeding supplement (PROSource TF20)  60 mL Per Tube Daily   free water  200 mL Per Tube Q6H   insulin aspart  0-15 Units Subcutaneous Q4H   insulin glargine-yfgn  12 Units Subcutaneous Daily   methIMAzole  10 mg Per Tube BID   metoprolol tartrate  25 mg Per Tube BID   potassium chloride  40 mEq Per Tube Q4H   Continuous Infusions:  diltiazem (CARDIZEM) infusion 5 mg/hr (01/29/23 0249)   feeding supplement (OSMOLITE 1.2 CAL) 1,000 mL (01/29/23 0509)   heparin 1,100 Units/hr (01/28/23 1303)   magnesium sulfate bolus IVPB 4 g (01/29/23 0938)   PRN Meds: acetaminophen **OR** acetaminophen, albuterol, dextrose, loperamide HCl, melatonin, ondansetron **OR** ondansetron (ZOFRAN) IV   Vital Signs    Vitals:   01/28/23 1839 01/28/23 2003 01/29/23 0310 01/29/23 0903  BP:  (!) 138/99 136/67 (!) 142/73  Pulse:  (!) 106 90 97  Resp:  20 15   Temp: 98.9 F (37.2 C) 98.7 F (37.1 C) 98.6 F (37 C)   TempSrc: Oral Oral Oral   SpO2:  100% 100%   Weight:      Height:        Intake/Output Summary (Last 24 hours) at 01/29/2023 1109 Last data filed at 01/29/2023 0100 Gross per 24 hour  Intake 4062 ml  Output --  Net 4062 ml      01/27/2023    4:27 AM 01/26/2023    5:00 AM 01/23/2023   11:00 PM  Last 3 Weights  Weight (lbs) 141 lb 8.6 oz 141 lb 1.5 oz 140 lb 3.4 oz  Weight (kg) 64.2 kg 64 kg 63.6 kg      Telemetry     Sinus rhythm with PACs and brief burst of ectopic atrial tachycardia, max 15 seconds- Personally Reviewed  ECG    01/25/2023-sinus tachycardia, hint of ST segment elevation anterolateral T wave inversion, inferior ST segment depression with T wave inversion- Personally Reviewed  Physical Exam  Lying almost fully supine in bed, breathing comfortably GEN: No acute distress.   Neck: No JVD Cardiac: RRR, no murmurs, rubs, or gallops.  Respiratory: Clear to auscultation bilaterally. GI: Soft, nontender, non-distended  MS: No edema; No deformity. Neuro:  Nonfocal  Psych: Normal affect   Labs    High Sensitivity Troponin:   Recent Labs  Lab 01/23/23 1319 01/23/23 1505 01/23/23 1851  TROPONINIHS 152* 139* 115*     Chemistry Recent Labs  Lab 01/23/23 1319 01/23/23 1851 01/24/23 0142 01/24/23 1433 01/27/23 0722 01/28/23 0423 01/29/23 0239  NA 155*   < > 160*   < > 152* 150* 142  K 3.6   < > 3.7   < > 3.6 4.0 3.3*  CL 118*   < > 128*   < > 120* 124* 113*  CO2 13*   < > 13*   < >  19* 20* 20*  GLUCOSE 297*   < > 174*   < > 242* 244* 263*  BUN 31*   < > 31*   < > 14 13 12   CREATININE 1.98*   < > 1.69*   < > 0.90 0.83 0.76  CALCIUM 12.4*   < > 11.0*   < > 9.8 9.4 9.0  MG  --   --   --    < > 1.8 2.0 1.6*  PROT 7.7  --  6.3*  --   --   --   --   ALBUMIN 3.3*  --  2.6*  --   --   --   --   AST 23  --  17  --   --   --   --   ALT 20  --  17  --   --   --   --   ALKPHOS 88  --  70  --   --   --   --   BILITOT 1.0  --  0.9  --   --   --   --   GFRNONAA 26*   < > 31*   < > >60 >60 >60  ANIONGAP 24*   < > 19*   < > 13 6 9    < > = values in this interval not displayed.    Lipids No results for input(s): "CHOL", "TRIG", "HDL", "LABVLDL", "LDLCALC", "CHOLHDL" in the last 168 hours.  Hematology Recent Labs  Lab 01/27/23 0722 01/28/23 0423 01/29/23 0239  WBC 8.4 7.3 7.2  RBC 3.79* 3.74* 3.60*  HGB 9.7* 9.5* 8.8*  HCT 31.8* 31.6* 30.0*  MCV 83.9 84.5 83.3  MCH 25.6* 25.4*  24.4*  MCHC 30.5 30.1 29.3*  RDW 16.9* 16.9* 16.8*  PLT 166 159 153   Thyroid  Recent Labs  Lab 01/23/23 1319 01/23/23 1851  TSH 0.028*  --   FREET4  --  4.47*    BNP Recent Labs  Lab 01/23/23 1851  BNP 112.5*    DDimer No results for input(s): "DDIMER" in the last 168 hours.   Radiology    No results found.  Cardiac Studies   Echocardiogram 01/24/2023   1. Elevated LVOT peak velocity of 4 m/s likely due to hyperdynamic LV  function and chordal SAM.   2. Left ventricular ejection fraction, by estimation, is >75%. The left  ventricle has hyperdynamic function. The left ventricle has no regional  wall motion abnormalities. There is mild concentric left ventricular  hypertrophy. Left ventricular diastolic  parameters are consistent with Grade I diastolic dysfunction (impaired  relaxation).   3. Right ventricular systolic function is normal. The right ventricular  size is normal.   4. The mitral valve is normal in structure. No evidence of mitral valve  regurgitation. No evidence of mitral stenosis.   5. The aortic valve is normal in structure. Aortic valve regurgitation is  not visualized. No aortic stenosis is present.   6. The inferior vena cava is dilated in size with >50% respiratory  variability, suggesting right atrial pressure of 8 mmHg.   Comparison(s): No prior Echocardiogram.     Patient Profile     77 y.o. female with hypertension, hyperlipidemia, type 2 diabetes mellitus, hyperthyroidism status post recent radioactive iodine ablation, admitted with encephalopathy and severe hypernatremia and paroxysmal atrial fibrillation.    Assessment & Plan    Continues to have very brief episodes of ectopic atrial tachycardia, no further atrial fibrillation.  Still receiving intravenous diltiazem and intravenous heparin with limited p.o. intake, but she was able to chew and swallow some crackers.  Transition to oral medications today.  She was taking the  metoprolol tartrate 25 mg twice daily before admission.  Will resume the beta-blocker at a higher dose.  If necessary can decrease her outpt dose of lisinopril.     For questions or updates, please contact [facility] Please consult [email or web address] for contact info under        Signed, Thurmon Fair, MD  01/29/2023, 11:09 AM

## 2023-01-29 NOTE — Progress Notes (Signed)
PROGRESS NOTE        PATIENT DETAILS Name: Samantha [name] Age: 77 y.o. Sex: female Date of Birth: May 08, 1946 Admit Date: 01/23/2023 Admitting Physician Samantha Del, MD EAV:[id number], Samantha Doughty, NP  Brief Summary: Patient is a 77 y.o.  female with history of DM-2, HTN, hyperthyroidism-s/p RAI radioactive iodine ablation 06/24-who was apparently diagnosed with a UTI-initially on Bactrim-then changed to Augmentin-presented with altered mental status x 2-3 days along with very poor oral intake.  Patient was found to have AKI/metabolic acidosis and hypernatremia-and subsequently admitted to the hospitalist service.  Significant events: 8/13>> admit to [facility] 8/14>> started insulin GTT for euglycemic DKA 8/15>> A-fib RVR-no response to IV Lopressor, started Cardizem infusion-cardiology consult. 8/16>> more awake/alert compared to past few days-maintaining sinus rhythm-failed SLP eval-Cortrak ordered  Significant studies: 8/13>> CT head: No acute intracranial abnormality 8/13>> CXR: No obvious PNA 8/13>> renal ultrasound: No hydronephrosis 8/15>> echo: EF> 75%.  Significant microbiology data: 7/25>> urine culture: Klebsiella 8/13>> COVID PCR: Negative 8/13>> [name] cultures: Negative 8/14>> urine culture: Negative  Procedures: None  Consults: None  Subjective: Awake/alert-conversing-no major issues overnight.  Objective: Vitals: [name] pressure (!) 142/73, pulse 97, temperature 98.6 F (37 C), temperature source Oral, resp. rate 15, height 5\' 1"  (1.549 m), weight 64.2 kg, SpO2 100%.   Exam: Gen Exam:Alert awake-not in any distress HEENT:atraumatic, normocephalic Chest: B/L clear to auscultation anteriorly CVS:S1S2 regular Abdomen:soft non tender, non distended Extremities:no edema Neurology: Non focal Skin: no rash  Pertinent Labs/Radiology:    Latest Ref Rng & Units 01/29/2023    2:39 AM 01/28/2023    4:23 AM 01/27/2023    7:22 AM  CBC  WBC 4.0 -  10.5 K/uL 7.2  7.3  8.4   Hemoglobin 12.0 - 15.0 g/dL 8.8  9.5  9.7   Hematocrit 36.0 - 46.0 % 30.0  31.6  31.8   Platelets 150 - 400 K/uL 153  159  166     Lab Results  Component Value Date   NA 142 01/29/2023   K 3.3 (L) 01/29/2023   CL 113 (H) 01/29/2023   CO2 20 (L) 01/29/2023      Assessment/Plan: Acute metabolic encephalopathy Likely due to hypernatremia/AKI/possible euglycemic DKA Encephalopathy resolved after treatment of underlying etiologies Delirium precautions Supportive care.    Paroxysmal atrial fibrillation with RVR Occurred on 8/15-rates in the 180s-190s at times-subsequently converted back to sinus rhythm Maintaining sinus rhythm-cardiology following-remains on Cardizem infusion/beta-blockers and IV heparin Awaiting further recommendations from cardiology.    AKI Likely hemodynamically mediated (poor oral intake and lisinopril use), euglycemic DKA and recent Bactrim use Resolved with IVF and treatment of underlying euglycemic DKA.  Hypernatremia Secondary to poor oral intake in the setting of recent UTI Sodium levels have normalized after treatment initially with half-normal and then D5W-and most recently through free water through PEG tube.  Euglycemic DKA from Jardiance use Resolved with IV insulin infusion Has been transitioned to SQ insulin Would not rechallenge with Jardiance in the future.  DM-2 (A1c 9.8 on 8/30) CBGs on the higher side Oral intake being started today Continue 12 units of Semglee and SSI-reassess on 8/20-suspect NG tube can be discontinued soon.    Recent Labs    01/29/23 0011 01/29/23 0329 01/29/23 0814  GLUCAP 251* 217* 244*     Hypercalcemia Suspect this is mostly due to dehydration/hemoconcentration Resolved with  IVF.  Hypophosphatemia Repleted.  Hypokalemia Replete/recheck  Hypomagnesemia Replete  Recent complicated UTI with Klebsiella pneumoniae Initially on Bactrim-then switched to Augmentin (as an  outpatient) Doubt sepsis physiology-do not think it was present on admission Encephalopathy was mostly from severe electrolyte abnormalities Urine culture negative-Rocephin x 5 days completed on 8/17-does not require any further antibiotics.  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 Nida-endocrinology on 8/16-recommendations were to start Tapazole. Already on beta-blocker Follow-up with endocrine as an outpatient  HTN BP stable-continue metoprolol  Oropharyngeal dysphagia Likely due to debility/deconditioning in the setting of acute/critical illness Cortrak tube placed on 8/16-reevaluated by SLP on 8/19-dysphagia 2 diet started-suspect we can remove NG tube either today or tomorrow.    Debility/deconditioning Due to critical illness PT/OT eval Suspect will require SNF later this week  Nutrition Status: Nutrition Problem: Moderate Malnutrition Etiology: chronic illness Signs/Symptoms: percent weight loss, mild fat depletion, moderate muscle depletion Percent weight loss: 22 % Interventions: Tube feeding    BMI: Estimated body mass index is 26.74 kg/m as calculated from the following:   Height as of this encounter: 5\' 1"  (1.549 m).   Weight as of this encounter: 64.2 kg.   Code status:   Code Status: Full Code   DVT Prophylaxis: SCDs Start: 01/25/23 0805   Family Communication: None at bedside.  Disposition Plan: Status is: Inpatient Remains inpatient appropriate because: Severity of illness   Planned Discharge Destination: SNF later this week.   Diet: Diet Order             DIET DYS 2 Room service appropriate? No; Fluid consistency: Thin  Diet effective now                     Antimicrobial agents: Anti-infectives (From admission, onward)    Start     Dose/Rate Route Frequency Ordered Stop   01/24/23 1500  cefTRIAXone (ROCEPHIN) 1 g in sodium  chloride 0.9 % 100 mL IVPB        1 g 200 mL/hr over 30 Minutes Intravenous Every 24 hours 01/23/23 1836 01/27/23 1511   01/23/23 1430  cefTRIAXone (ROCEPHIN) 2 g in sodium chloride 0.9 % 100 mL IVPB  Status:  Discontinued        2 g 200 mL/hr over 30 Minutes Intravenous Every 24 hours 01/23/23 1417 01/23/23 1835        MEDICATIONS: Scheduled Meds:  aspirin EC  81 mg Oral Daily   feeding supplement (PROSource TF20)  60 mL Per Tube Daily   free water  200 mL Per Tube Q6H   insulin aspart  0-15 Units Subcutaneous Q4H   insulin glargine-yfgn  12 Units Subcutaneous Daily   methIMAzole  10 mg Per Tube BID   metoprolol tartrate  25 mg Per Tube BID   potassium chloride  40 mEq Per Tube Q4H   Continuous Infusions:  diltiazem (CARDIZEM) infusion 5 mg/hr (01/29/23 0249)   feeding supplement (OSMOLITE 1.2 CAL) 1,000 mL (01/29/23 0509)   heparin 1,100 Units/hr (01/28/23 1303)   magnesium sulfate bolus IVPB 4 g (01/29/23 0938)   PRN Meds:.acetaminophen **OR** acetaminophen, albuterol, dextrose, loperamide HCl, melatonin, ondansetron **OR** ondansetron (ZOFRAN) IV   I have personally reviewed following labs and imaging studies  LABORATORY DATA: CBC: Recent Labs  Lab 01/23/23 1319 01/23/23 1851 01/25/23 0816 01/26/23 0547 01/27/23 0722 01/28/23 0423 01/29/23 0239  WBC 11.7*   < > 10.1 9.2 8.4  7.3 7.2  NEUTROABS 9.8*  --   --   --   --   --   --   HGB 13.2   < > 11.2* 9.8* 9.7* 9.5* 8.8*  HCT 45.4   < > 38.1 32.5* 31.8* 31.6* 30.0*  MCV 85.0   < > 84.1 82.1 83.9 84.5 83.3  PLT 397   < > 209 196 166 159 153   < > = values in this interval not displayed.    Basic Metabolic Panel: Recent Labs  Lab 01/25/23 1952 01/26/23 0547 01/26/23 1403 01/27/23 0722 01/28/23 0423 01/29/23 0239  NA 156* 156* 148* 152* 150* 142  K 3.6 3.6 3.6 3.6 4.0 3.3*  CL 127* 128* 121* 120* 124* 113*  CO2 19* 20* 18* 19* 20* 20*  GLUCOSE 200* 178* 332* 242* 244* 263*  BUN 14 13 12 14 13 12    CREATININE 0.94 1.16* 0.98 0.90 0.83 0.76  CALCIUM 10.4* 10.6* 9.8 9.8 9.4 9.0  MG 1.6* 2.0  --  1.8 2.0 1.6*  PHOS  --  1.5* 2.9 2.8 2.4* 3.2    GFR: Estimated Creatinine Clearance: 51.4 mL/min (by C-G formula based on SCr of 0.76 mg/dL).  Liver Function Tests: Recent Labs  Lab 01/23/23 1319 01/24/23 0142  AST 23 17  ALT 20 17  ALKPHOS 88 70  BILITOT 1.0 0.9  PROT 7.7 6.3*  ALBUMIN 3.3* 2.6*   Recent Labs  Lab 01/23/23 1319  LIPASE 338*   No results for input(s): "AMMONIA" in the last 168 hours.  Coagulation Profile: No results for input(s): "INR", "PROTIME" in the last 168 hours.  Cardiac Enzymes: No results for input(s): "CKTOTAL", "CKMB", "CKMBINDEX", "TROPONINI" in the last 168 hours.  BNP (last 3 results) No results for input(s): "PROBNP" in the last 8760 hours.  Lipid Profile: No results for input(s): "CHOL", "HDL", "LDLCALC", "TRIG", "CHOLHDL", "LDLDIRECT" in the last 72 hours.  Thyroid Function Tests: No results for input(s): "TSH", "T4TOTAL", "FREET4", "T3FREE", "THYROIDAB" in the last 72 hours.   Anemia Panel: No results for input(s): "VITAMINB12", "FOLATE", "FERRITIN", "TIBC", "IRON", "RETICCTPCT" in the last 72 hours.  Urine analysis:    Component Value Date/Time   COLORURINE YELLOW 01/24/2023 0336   APPEARANCEUR HAZY (A) 01/24/2023 0336   LABSPEC 1.018 01/24/2023 0336   PHURINE 5.0 01/24/2023 0336   GLUCOSEU >=500 (A) 01/24/2023 0336   HGBUR SMALL (A) 01/24/2023 0336   HGBUR negative 11/11/2007 1104   BILIRUBINUR NEGATIVE 01/24/2023 0336   KETONESUR 20 (A) 01/24/2023 0336   PROTEINUR NEGATIVE 01/24/2023 0336   UROBILINOGEN 0.2 11/18/2014 2105   NITRITE NEGATIVE 01/24/2023 0336   LEUKOCYTESUR NEGATIVE 01/24/2023 0336    Sepsis Labs: Lactic Acid, Venous    Component Value Date/Time   LATICACIDVEN 1.9 01/23/2023 1716    MICROBIOLOGY: Recent Results (from the past 240 hour(s))  Resp panel by RT-PCR (RSV, Flu A&B, Covid) Urine,  Clean Catch     Status: None   Collection Time: 01/19/23  8:02 PM   Specimen: Urine, Clean Catch; Nasal Swab  Result Value Ref Range Status   SARS Coronavirus 2 by RT PCR NEGATIVE NEGATIVE Final   Influenza A by PCR NEGATIVE NEGATIVE Final   Influenza B by PCR NEGATIVE NEGATIVE Final    Comment: (NOTE) The Xpert Xpress SARS-CoV-2/FLU/RSV plus assay is intended as an aid in the diagnosis of influenza from Nasopharyngeal swab specimens and should not be used as a sole basis for treatment. Nasal washings and aspirates are unacceptable  for Xpert Xpress SARS-CoV-2/FLU/RSV testing.  Fact Sheet for Patients: BloggerCourse.com  Fact Sheet for Healthcare Providers: SeriousBroker.it  This test is not yet approved or cleared by the [place] FDA and has been authorized for detection and/or diagnosis of SARS-CoV-2 by FDA under an Emergency Use Authorization (EUA). This EUA will remain in effect (meaning this test can be used) for the duration of the COVID-19 declaration under Section 564(b)(1) of the Act, 21 U.S.C. section 360bbb-3(b)(1), unless the authorization is terminated or revoked.     Resp Syncytial Virus by PCR NEGATIVE NEGATIVE Final    Comment: (NOTE) Fact Sheet for Patients: BloggerCourse.com  Fact Sheet for Healthcare Providers: SeriousBroker.it  This test is not yet approved or cleared by the [place] FDA and has been authorized for detection and/or diagnosis of SARS-CoV-2 by FDA under an Emergency Use Authorization (EUA). This EUA will remain in effect (meaning this test can be used) for the duration of the COVID-19 declaration under Section 564(b)(1) of the Act, 21 U.S.C. section 360bbb-3(b)(1), unless the authorization is terminated or revoked.  Performed at [facility], [address] [place], [place] [address]   Culture, [name] (routine x 2)      Status: None   Collection Time: 01/23/23  1:00 PM   Specimen: [name]  Result Value Ref Range Status   Specimen Description [name] RIGHT ANTECUBITAL  Final   Special Requests   Final    BOTTLES DRAWN AEROBIC AND ANAEROBIC [name] Culture results may not be optimal due to an inadequate volume of [name] received in culture bottles   Culture   Final    NO GROWTH 5 DAYS Performed at [facility], [address] [place], [place] [address]    Report Status 01/28/2023 FINAL  Final  Culture, [name] (routine x 2)     Status: None   Collection Time: 01/23/23  1:19 PM   Specimen: [name]  Result Value Ref Range Status   Specimen Description [name] [name] LEFT ARM  Final   Special Requests   Final    BOTTLES DRAWN AEROBIC AND ANAEROBIC [name] Culture adequate volume   Culture   Final    NO GROWTH 5 DAYS Performed at [facility], [address], [place], [place] [address]    Report Status 01/28/2023 FINAL  Final  SARS Coronavirus 2 by RT PCR (hospital order, performed in [facility] hospital lab) *cepheid single result test* Anterior Nasal Swab     Status: None   Collection Time: 01/23/23  1:19 PM   Specimen: Anterior Nasal Swab  Result Value Ref Range Status   SARS Coronavirus 2 by RT PCR NEGATIVE NEGATIVE Final    Comment: Performed at [facility], [address], [place], [place] [address]  Urine Culture     Status: None   Collection Time: 01/24/23  3:39 AM   Specimen: Urine, Clean Catch  Result Value Ref Range Status   Specimen Description URINE, CLEAN CATCH  Final   Special Requests NONE  Final   Culture   Final    NO GROWTH Performed at [facility], [address], [place], [place] [address]    Report Status 01/25/2023 FINAL  Final    RADIOLOGY STUDIES/RESULTS: No results found.   LOS: 6 days   Jeoffrey Massed, MD  [facility]    To contact the attending provider between 7A-7P or the covering provider during after hours 7P-7A, please log into the  web site  [email or web address] and access using universal [place] password for that web site. If you do not have the password, please call the hospital operator.  01/29/2023, 11:02 AM

## 2023-01-29 NOTE — TOC Progression Note (Signed)
Transition of Care [facility]) - Progression Note    Patient Details  Name: Samantha [name] MRN: [id number] Date of Birth: April 05, 1946  [facility]) CM/SW Contact  Erin Sons, [place] Phone Number: 01/29/2023, 1:45 PM  Clinical Narrative:     CSW met with pt, pt's son, and niece. Discussed SNF process with pt. CSW provided list of bed offers and medicare star ratings. Family will look over/discuss list. CSW will follow up for choice.   Expected Discharge Plan: Skilled Nursing Facility Barriers to Discharge: Continued Medical Work up, English as a second language teacher  Expected Discharge Plan and Services In-house Referral: Clinical Social Work     Living arrangements for the past 2 months: Single Family Home                                       Social Determinants of Health (SDOH) Interventions SDOH Screenings   Food Insecurity: No Food Insecurity (01/24/2023)  Housing: Low Risk  (01/24/2023)  Transportation Needs: No Transportation Needs (01/24/2023)  Utilities: Not At Risk (01/24/2023)  Alcohol Screen: Low Risk  (11/16/2022)  Depression (PHQ2-9): Medium Risk (11/16/2022)  Financial Resource Strain: Low Risk  (11/16/2022)  Physical Activity: Inactive (11/16/2022)  Social Connections: Moderately Integrated (11/16/2022)  Stress: No Stress Concern Present (11/16/2022)  Tobacco Use: Low Risk  (01/23/2023)    Readmission Risk Interventions     No data to display

## 2023-01-29 NOTE — Progress Notes (Signed)
Nutrition Follow-up  DOCUMENTATION CODES:   Non-severe (moderate) malnutrition in context of chronic illness  INTERVENTION:  Ensure Enlive po BID, each supplement provides 350 kcal and 20 grams of protein.  Transition to nocturnal tube feeds via Cortrak: Osmolite 1.5 at 83 mL x 12 hours from 1800 to 0600 (1000 mL per day) 60 mL ProSource TF20 - Daily Free water flush: per MD Regimen provides 1580 mL, 83 gm protein, and 762 mL total free water daily.  NUTRITION DIAGNOSIS:  Moderate Malnutrition related to chronic illness as evidenced by percent weight loss, mild fat depletion, moderate muscle depletion. - Ongoing  GOAL:  Patient will meet greater than or equal to 90% of their needs - Not met  MONITOR:  PO intake, Labs, Weight trends, I & O's  REASON FOR ASSESSMENT:  Malnutrition Screening Tool    ASSESSMENT:  77 y.o. female presented to the ED with increased confusion and weakness. PMH includes CKD III, T2DM, HLD, HTN, and hyperthyroidism. Pt admitted with sepsis 2/2 UTI, AKI on CKD.   8/13 - Admitted 8/14 - NPO 8/16 - Cortrak placed  8/19 - diet advanced to Dysphagia 2  Pt much more awake and alert, diet was able to be advanced. Discussed with pt, plan to eat as much during the day and receive TF at night to supplement. Pt agreeable, discussed adding Ensure back, pt states she is going to take one thing at a time.  Medications reviewed and include: Novolog SSI, Semglee Labs reviewed: Sodium 142, Potassium 3.3, BUN 12, Creatinine 0.76, Phosphorus 3.2, Magnesium 1.6 CBG: 208-264 x 24 hrs  UOP: 650 mL x 24 hrs  Diet Order:   Diet Order             DIET DYS 2 Room service appropriate? No; Fluid consistency: Thin  Diet effective now                  EDUCATION NEEDS:  Education needs have been addressed  Skin:  Skin Assessment: Reviewed RN Assessment  Last BM:  8/18  Height:  Ht Readings from Last 1 Encounters:  01/23/23 5\' 1"  (1.549 m)   Weight:  Wt  Readings from Last 1 Encounters:  01/27/23 64.2 kg   Ideal Body Weight:  47.7 kg  BMI:  Body mass index is 26.74 kg/m.  Estimated Nutritional Needs:  Kcal:  1700-1900 Protein:  85-105 grams Fluid:  >/= 1.7 L   Kirby Crigler RD, LDN Clinical Dietitian See [facility] for contact information.

## 2023-01-29 NOTE — TOC Benefit Eligibility Note (Signed)
Patient Product/process development scientist completed.    The patient is insured through [employer]. Patient has Medicare and is not eligible for a copay card, but may be able to apply for patient assistance, if available.    Ran test claim for Eliquis 5 mg and the current 30 day co-pay is $45.00.   This test claim was processed through [facility]- copay amounts may vary at other pharmacies due to pharmacy/plan contracts, or as the patient moves through the different stages of their insurance plan.     Roland Earl, CPHT Pharmacy Technician III Certified Patient Advocate [facility] Patient Advocate Team Direct Number: [phone]  Fax: [phone]

## 2023-01-29 NOTE — Discharge Instructions (Signed)

## 2023-01-29 NOTE — Plan of Care (Signed)
  Problem: Education: Goal: Ability to describe self-care measures that may prevent or decrease complications (Diabetes Survival Skills Education) will improve Outcome: Progressing Goal: Individualized Educational Video(s) Outcome: Progressing   Problem: Coping: Goal: Ability to adjust to condition or change in health will improve Outcome: Progressing   Problem: Fluid Volume: Goal: Ability to maintain a balanced intake and output will improve Outcome: Progressing   Problem: Health Behavior/Discharge Planning: Goal: Ability to identify and utilize available resources and services will improve Outcome: Progressing Goal: Ability to manage health-related needs will improve Outcome: Progressing   Problem: Metabolic: Goal: Ability to maintain appropriate glucose levels will improve Outcome: Progressing   Problem: Nutritional: Goal: Maintenance of adequate nutrition will improve Outcome: Progressing Goal: Progress toward achieving an optimal weight will improve Outcome: Progressing   Problem: Skin Integrity: Goal: Risk for impaired skin integrity will decrease Outcome: Progressing   Problem: Tissue Perfusion: Goal: Adequacy of tissue perfusion will improve Outcome: Progressing   Problem: Education: Goal: Knowledge of General Education information will improve Description: Including pain rating scale, medication(s)/side effects and non-pharmacologic comfort measures Outcome: Progressing   Problem: Health Behavior/Discharge Planning: Goal: Ability to manage health-related needs will improve Outcome: Progressing   Problem: Clinical Measurements: Goal: Ability to maintain clinical measurements within normal limits will improve Outcome: Progressing Goal: Will remain free from infection Outcome: Progressing Goal: Diagnostic test results will improve Outcome: Progressing Goal: Respiratory complications will improve Outcome: Progressing Goal: Cardiovascular complication will  be avoided Outcome: Progressing   Problem: Activity: Goal: Risk for activity intolerance will decrease Outcome: Progressing   Problem: Nutrition: Goal: Adequate nutrition will be maintained Outcome: Progressing   Problem: Coping: Goal: Level of anxiety will decrease Outcome: Progressing   Problem: Elimination: Goal: Will not experience complications related to bowel motility Outcome: Progressing Goal: Will not experience complications related to urinary retention Outcome: Progressing   Problem: Pain Managment: Goal: General experience of comfort will improve Outcome: Progressing   Problem: Safety: Goal: Ability to remain free from injury will improve Outcome: Progressing   Problem: Skin Integrity: Goal: Risk for impaired skin integrity will decrease Outcome: Progressing   Problem: Education: Goal: Ability to describe self-care measures that may prevent or decrease complications (Diabetes Survival Skills Education) will improve Outcome: Progressing Goal: Individualized Educational Video(s) Outcome: Progressing   Problem: Cardiac: Goal: Ability to maintain an adequate cardiac output will improve Outcome: Progressing   Problem: Health Behavior/Discharge Planning: Goal: Ability to identify and utilize available resources and services will improve Outcome: Progressing Goal: Ability to manage health-related needs will improve Outcome: Progressing   Problem: Fluid Volume: Goal: Ability to achieve a balanced intake and output will improve Outcome: Progressing   Problem: Metabolic: Goal: Ability to maintain appropriate glucose levels will improve Outcome: Progressing   Problem: Nutritional: Goal: Maintenance of adequate nutrition will improve Outcome: Progressing Goal: Maintenance of adequate weight for body size and type will improve Outcome: Progressing   Problem: Respiratory: Goal: Will regain and/or maintain adequate ventilation Outcome: Progressing    Problem: Urinary Elimination: Goal: Ability to achieve and maintain adequate renal perfusion and functioning will improve Outcome: Progressing   

## 2023-01-30 DIAGNOSIS — I471 Supraventricular tachycardia, unspecified: Secondary | ICD-10-CM | POA: Diagnosis not present

## 2023-01-30 DIAGNOSIS — N390 Urinary tract infection, site not specified: Secondary | ICD-10-CM | POA: Diagnosis not present

## 2023-01-30 DIAGNOSIS — I480 Paroxysmal atrial fibrillation: Secondary | ICD-10-CM | POA: Diagnosis not present

## 2023-01-30 DIAGNOSIS — E440 Moderate protein-calorie malnutrition: Secondary | ICD-10-CM | POA: Diagnosis not present

## 2023-01-30 DIAGNOSIS — A419 Sepsis, unspecified organism: Secondary | ICD-10-CM | POA: Diagnosis not present

## 2023-01-30 LAB — GLUCOSE, CAPILLARY
Glucose-Capillary: 165 mg/dL — ABNORMAL HIGH (ref 70–99)
Glucose-Capillary: 288 mg/dL — ABNORMAL HIGH (ref 70–99)
Glucose-Capillary: 286 mg/dL — ABNORMAL HIGH (ref 70–99)
Glucose-Capillary: 80 mg/dL (ref 70–99)

## 2023-01-30 LAB — BASIC METABOLIC PANEL
Anion gap: 8 (ref 5–15)
BUN: 12 mg/dL (ref 8–23)
CO2: 23 mmol/L (ref 22–32)
Calcium: 9.7 mg/dL (ref 8.9–10.3)
Chloride: 112 mmol/L — ABNORMAL HIGH (ref 98–111)
Creatinine, Ser: 0.79 mg/dL (ref 0.44–1.00)
GFR, Estimated: >60 mL/min (ref >60)
Glucose, Bld: 151 mg/dL — ABNORMAL HIGH (ref 70–99)
Potassium: 4.2 mmol/L (ref 3.5–5.1)
Sodium: 143 mmol/L (ref 135–145)

## 2023-01-30 LAB — MAGNESIUM: Magnesium: 2.2 mg/dL (ref 1.7–2.4)

## 2023-01-30 LAB — PHOSPHORUS: Phosphorus: 4.5 mg/dL (ref 2.5–4.6)

## 2023-01-30 NOTE — Inpatient Diabetes Management (Signed)
Inpatient Diabetes Program Recommendations  AACE/ADA: New Consensus Statement on Inpatient Glycemic Control   Target Ranges:  Prepandial:   less than 140 mg/dL      Peak postprandial:   less than 180 mg/dL (1-2 hours)      Critically ill patients:  140 - 180 mg/dL    Latest Reference Range & Units 01/30/23 08:33 01/30/23 12:12  Glucose-Capillary 70 - 99 mg/dL 409 (H) 811 (H)    Latest Reference Range & Units 01/29/23 08:14 01/29/23 12:34 01/29/23 16:34 01/29/23 20:30  Glucose-Capillary 70 - 99 mg/dL 914 (H) 782 (H) 956 (H) 147 (H)   Review of Glycemic Control  Current orders for Inpatient glycemic control: Semglee 12 units daily, Novolog 0-15 units TID with meals  Inpatient Diabetes Program Recommendations:    Insulin: Please consider ordering Novolog 0-5 units at bedtime and Novolog 4 units TID with meals for meal coverage if patient eats at least 50% of meals.  Thanks, Orlando Penner, RN, MSN, CDCES Diabetes Coordinator Inpatient Diabetes Program [phone] (Team Pager from 8am to 5pm)

## 2023-01-30 NOTE — Plan of Care (Signed)
  Problem: Education: Goal: Ability to describe self-care measures that may prevent or decrease complications (Diabetes Survival Skills Education) will improve Outcome: Progressing Goal: Individualized Educational Video(s) Outcome: Progressing   Problem: Coping: Goal: Ability to adjust to condition or change in health will improve Outcome: Progressing   Problem: Fluid Volume: Goal: Ability to maintain a balanced intake and output will improve Outcome: Progressing   Problem: Health Behavior/Discharge Planning: Goal: Ability to identify and utilize available resources and services will improve Outcome: Progressing Goal: Ability to manage health-related needs will improve Outcome: Progressing   Problem: Metabolic: Goal: Ability to maintain appropriate glucose levels will improve Outcome: Progressing   Problem: Nutritional: Goal: Maintenance of adequate nutrition will improve Outcome: Progressing Goal: Progress toward achieving an optimal weight will improve Outcome: Progressing   Problem: Skin Integrity: Goal: Risk for impaired skin integrity will decrease Outcome: Progressing   Problem: Tissue Perfusion: Goal: Adequacy of tissue perfusion will improve Outcome: Progressing   Problem: Education: Goal: Knowledge of General Education information will improve Description: Including pain rating scale, medication(s)/side effects and non-pharmacologic comfort measures Outcome: Progressing   Problem: Health Behavior/Discharge Planning: Goal: Ability to manage health-related needs will improve Outcome: Progressing   Problem: Clinical Measurements: Goal: Ability to maintain clinical measurements within normal limits will improve Outcome: Progressing Goal: Will remain free from infection Outcome: Progressing Goal: Diagnostic test results will improve Outcome: Progressing Goal: Respiratory complications will improve Outcome: Progressing Goal: Cardiovascular complication will  be avoided Outcome: Progressing   Problem: Activity: Goal: Risk for activity intolerance will decrease Outcome: Progressing   Problem: Nutrition: Goal: Adequate nutrition will be maintained Outcome: Progressing   Problem: Coping: Goal: Level of anxiety will decrease Outcome: Progressing   Problem: Elimination: Goal: Will not experience complications related to bowel motility Outcome: Progressing Goal: Will not experience complications related to urinary retention Outcome: Progressing   Problem: Pain Managment: Goal: General experience of comfort will improve Outcome: Progressing   Problem: Safety: Goal: Ability to remain free from injury will improve Outcome: Progressing   Problem: Skin Integrity: Goal: Risk for impaired skin integrity will decrease Outcome: Progressing   Problem: Education: Goal: Ability to describe self-care measures that may prevent or decrease complications (Diabetes Survival Skills Education) will improve Outcome: Progressing Goal: Individualized Educational Video(s) Outcome: Progressing   Problem: Cardiac: Goal: Ability to maintain an adequate cardiac output will improve Outcome: Progressing   Problem: Health Behavior/Discharge Planning: Goal: Ability to identify and utilize available resources and services will improve Outcome: Progressing Goal: Ability to manage health-related needs will improve Outcome: Progressing   Problem: Fluid Volume: Goal: Ability to achieve a balanced intake and output will improve Outcome: Progressing   Problem: Metabolic: Goal: Ability to maintain appropriate glucose levels will improve Outcome: Progressing   Problem: Nutritional: Goal: Maintenance of adequate nutrition will improve Outcome: Progressing Goal: Maintenance of adequate weight for body size and type will improve Outcome: Progressing   Problem: Respiratory: Goal: Will regain and/or maintain adequate ventilation Outcome: Progressing    Problem: Urinary Elimination: Goal: Ability to achieve and maintain adequate renal perfusion and functioning will improve Outcome: Progressing   

## 2023-01-30 NOTE — Progress Notes (Signed)
Speech Language Pathology Treatment: Dysphagia  Patient Details Name: Samantha [name] MRN: [id number] DOB: 1946-01-07 Today's Date: 01/30/2023 Time: 1037-1050 SLP Time Calculation (min) (ACUTE ONLY): 13 min  Assessment / Plan / Recommendation Clinical Impression  Pt eating breakfast of Dys 2 texture, thin liquids with son at bedside when therapist arrived. She is able to feed herself with set up assist and occasional for scooping or pt may drop her utensil due to weakness. Lingual mobility appears improved from yesterday however she is continuing to have deliberate/slow mastication and incomplete clearance with lingual residue initially needing cues to clear prior to next bite. Sips of thin liquid were effective to clear oral cavity consistently throughout session and encouraged her to alternate liquids and solids. There were occasional immediate and delayed throat clears but not concerning for significant airway intrusion. Her RN stated she took her pills with Ensure however when given the option she chose to continue meds whole in puree. Presently she appears functional to continue Dys 2 texture, thin liquids and don't suspect she will be able to upgrade texture while she is here. Plan is for transfer to SNF and recommend SLP at next venue of care assess pt's ability to upgrade textures if able. ST will sign off at this time.    HPI HPI: Samantha [name] is a 77 yo female presenting to ED 8/13 with increased confusion and progressive weakness over the last 24 hours. Found to have sepsis after recent UTI diagnosis. CTH negative. CXR revealed shallow inspiration with linear fibrosis or atelectasis in lung bases. 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  Recommendations may be updated based on patient status, additional functional criteria and insurance authorization.    Recommendations  Diet recommendations: Dysphagia 2 (fine chop);Thin liquid Liquids provided via: Cup;Straw Medication Administration: Whole meds with puree Supervision: Patient able to self feed;Staff to assist with self feeding (set up and sometimes drops utensils) Compensations: Slow rate;Small sips/bites;Lingual sweep for clearance of pocketing;Other (Comment);Follow solids with liquid (liquid wash) Postural Changes and/or Swallow Maneuvers: Seated upright 90 degrees                  Oral care BID   Intermittent Supervision/Assistance Dysphagia, oral phase (R13.11)     Discharge SLP treatment due to (comment) (functional and needs to stay on Dys 2 duration of admission)     Royce Macadamia  01/30/2023, 11:03 AM

## 2023-01-30 NOTE — Progress Notes (Signed)
PROGRESS NOTE        PATIENT DETAILS Name: Samantha [name] Age: 77 y.o. Sex: female Date of Birth: 07/07/1945 Admit Date: 01/23/2023 Admitting Physician Lurline Del, MD FAO:[id number], Devonne Doughty, NP  Brief Summary: Patient is a 77 y.o.  female with history of DM-2, HTN, hyperthyroidism-s/p RAI radioactive iodine ablation 06/24-who was apparently diagnosed with a UTI-initially on Bactrim-then changed to Augmentin-presented with altered mental status x 2-3 days along with very poor oral intake.  Patient was found to have AKI/metabolic acidosis and hypernatremia-and subsequently admitted to the hospitalist service.  Significant events: 8/13>> admit to [facility] 8/14>> started insulin GTT for euglycemic DKA 8/15>> A-fib RVR-no response to IV Lopressor, started Cardizem infusion-cardiology consult. 8/16>> more awake/alert compared to past few days-maintaining sinus rhythm-failed SLP eval-Cortrak ordered 8/19>>Cortrak removed  Significant studies: 8/13>> CT head: No acute intracranial abnormality 8/13>> CXR: No obvious PNA 8/13>> renal ultrasound: No hydronephrosis 8/15>> echo: EF> 75%.  Significant microbiology data: 7/25>> urine culture: Klebsiella 8/13>> COVID PCR: Negative 8/13>> blood cultures: Negative 8/14>> urine culture: Negative  Procedures: None  Consults: None  Subjective: Awake/alert-lying comfortably in bed.  Objective: Vitals: Blood pressure (!) 146/76, pulse 94, temperature 98 F (36.7 C), temperature source Oral, resp. rate 16, height 5\' 1"  (1.549 m), weight 68.4 kg, SpO2 100%.   Exam: Gen Exam:Alert awake-not in any distress HEENT:atraumatic, normocephalic Chest: B/L clear to auscultation anteriorly CVS:S1S2 regular Abdomen:soft non tender, non distended Extremities:no edema Neurology: Non focal Skin: no rash  Pertinent Labs/Radiology:    Latest Ref Rng & Units 01/29/2023    2:39 AM 01/28/2023    4:23 AM 01/27/2023    7:22 AM  CBC   WBC 4.0 - 10.5 K/uL 7.2  7.3  8.4   Hemoglobin 12.0 - 15.0 g/dL 8.8  9.5  9.7   Hematocrit 36.0 - 46.0 % 30.0  31.6  31.8   Platelets 150 - 400 K/uL 153  159  166     Lab Results  Component Value Date   NA 143 01/30/2023   K 4.2 01/30/2023   CL 112 (H) 01/30/2023   CO2 23 01/30/2023      Assessment/Plan: Acute metabolic encephalopathy Likely due to hypernatremia/AKI/possible euglycemic DKA Encephalopathy resolved after treatment of underlying etiologies Delirium precautions Supportive care.    Paroxysmal atrial fibrillation with RVR Occurred on 8/15-rates in the 180s-190s at times-subsequently converted back to sinus rhythm Maintaining sinus rhythm-no longer on Cardizem and heparin nfusion-has been switched to oral beta-blocker and Eliquis   AKI Likely hemodynamically mediated (poor oral intake and lisinopril use), euglycemic DKA and recent Bactrim use Resolved with IVF and treatment of underlying euglycemic DKA.  Hypernatremia Secondary to poor oral intake in the setting of recent UTI Sodium levels have normalized after treatment initially with half-normal and then D5W-and most recently through free water through PEG tube.  Euglycemic DKA from Jardiance use Resolved with IV insulin infusion Has been transitioned to SQ insulin Would not rechallenge with Jardiance in the future.  DM-2 (A1c 9.8 on 8/30) CBGs relatively stable-Semglee 12 units and SSI     Recent Labs    01/29/23 1634 01/29/23 2030 01/30/23 0833  GLUCAP 208* 147* 165*     Hypercalcemia Suspect this is mostly due to dehydration/hemoconcentration Resolved with IVF.  Hypophosphatemia Repleted.  Hypokalemia Replete/recheck  Hypomagnesemia Replete  Recent complicated UTI with Klebsiella pneumoniae  Initially on Bactrim-then switched to Augmentin (as an outpatient) Doubt sepsis physiology-do not think it was present on admission Encephalopathy was mostly from severe electrolyte  abnormalities Urine culture negative-Rocephin x 5 days completed on 8/17-does not require any further antibiotics.  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 Nida-endocrinology on 8/16-recommendations were to start Tapazole. Already on beta-blocker Follow-up with endocrine as an outpatient  HTN BP stable-continue metoprolol  Oropharyngeal dysphagia Likely due to debility/deconditioning in the setting of acute/critical illness Cortrak tube placed on 8/16-reevaluated by SLP on 8/19-dysphagia 2 diet started-which she is tolerating-an NG tube was removed yesterday.   Debility/deconditioning Due to critical illness PT/OT eval Suspect will require SNF later this week  Nutrition Status: Nutrition Problem: Moderate Malnutrition Etiology: chronic illness Signs/Symptoms: percent weight loss, mild fat depletion, moderate muscle depletion Percent weight loss: 22 % Interventions: Ensure Enlive (each supplement provides 350kcal and 20 grams of protein), Tube feeding    BMI: Estimated body mass index is 28.49 kg/m as calculated from the following:   Height as of this encounter: 5\' 1"  (1.549 m).   Weight as of this encounter: 68.4 kg.   Code status:   Code Status: Full Code   DVT Prophylaxis: SCDs Start: 01/25/23 0805 apixaban (ELIQUIS) tablet 5 mg    Family Communication: None at bedside.  Disposition Plan: Status is: Inpatient Remains inpatient appropriate because: Severity of illness   Planned Discharge Destination: SNF hopefully tomorrow or the day after  Diet: Diet Order             DIET DYS 2 Room service appropriate? No; Fluid consistency: Thin  Diet effective now                     Antimicrobial agents: Anti-infectives (From admission, onward)    Start     Dose/Rate Route Frequency Ordered Stop   01/24/23 1500  cefTRIAXone (ROCEPHIN) 1 g in sodium chloride  0.9 % 100 mL IVPB        1 g 200 mL/hr over 30 Minutes Intravenous Every 24 hours 01/23/23 1836 01/27/23 1511   01/23/23 1430  cefTRIAXone (ROCEPHIN) 2 g in sodium chloride 0.9 % 100 mL IVPB  Status:  Discontinued        2 g 200 mL/hr over 30 Minutes Intravenous Every 24 hours 01/23/23 1417 01/23/23 1835        MEDICATIONS: Scheduled Meds:  apixaban  5 mg Oral BID   feeding supplement  237 mL Oral BID BM   insulin aspart  0-15 Units Subcutaneous TID WC   insulin glargine-yfgn  12 Units Subcutaneous Daily   methIMAzole  10 mg Oral BID   metoprolol tartrate  50 mg Oral BID   Continuous Infusions:   PRN Meds:.acetaminophen **OR** acetaminophen, albuterol, dextrose, loperamide HCl, melatonin, ondansetron **OR** ondansetron (ZOFRAN) IV   I have personally reviewed following labs and imaging studies  LABORATORY DATA: CBC: Recent Labs  Lab 01/23/23 1319 01/23/23 1851 01/25/23 0816 01/26/23 0547 01/27/23 0722 01/28/23 0423 01/29/23 0239  WBC 11.7*   < > 10.1 9.2 8.4 7.3 7.2  NEUTROABS 9.8*  --   --   --   --   --   --   HGB 13.2   < > 11.2* 9.8* 9.7* 9.5* 8.8*  HCT 45.4   < > 38.1 32.5* 31.8* 31.6* 30.0*  MCV 85.0   < > 84.1 82.1 83.9 84.5 83.3  PLT 397   < > 209 196 166 159 153   < > = values in this interval not displayed.    Basic Metabolic Panel: Recent Labs  Lab 01/26/23 0547 01/26/23 1403 01/27/23 0722 01/28/23 0423 01/29/23 0239 01/30/23 0236  NA 156* 148* 152* 150* 142 143  K 3.6 3.6 3.6 4.0 3.3* 4.2  CL 128* 121* 120* 124* 113* 112*  CO2 20* 18* 19* 20* 20* 23  GLUCOSE 178* 332* 242* 244* 263* 151*  BUN 13 12 14 13 12 12   CREATININE 1.16* 0.98 0.90 0.83 0.76 0.79  CALCIUM 10.6* 9.8 9.8 9.4 9.0 9.7  MG 2.0  --  1.8 2.0 1.6* 2.2  PHOS 1.5* 2.9 2.8 2.4* 3.2 4.5    GFR: Estimated Creatinine Clearance: 52.9 mL/min (by C-G formula based on SCr of 0.79 mg/dL).  Liver Function Tests: Recent Labs  Lab 01/23/23 1319 01/24/23 0142  AST 23 17  ALT 20  17  ALKPHOS 88 70  BILITOT 1.0 0.9  PROT 7.7 6.3*  ALBUMIN 3.3* 2.6*   Recent Labs  Lab 01/23/23 1319  LIPASE 338*   No results for input(s): "AMMONIA" in the last 168 hours.  Coagulation Profile: No results for input(s): "INR", "PROTIME" in the last 168 hours.  Cardiac Enzymes: No results for input(s): "CKTOTAL", "CKMB", "CKMBINDEX", "TROPONINI" in the last 168 hours.  BNP (last 3 results) No results for input(s): "PROBNP" in the last 8760 hours.  Lipid Profile: No results for input(s): "CHOL", "HDL", "LDLCALC", "TRIG", "CHOLHDL", "LDLDIRECT" in the last 72 hours.  Thyroid Function Tests: No results for input(s): "TSH", "T4TOTAL", "FREET4", "T3FREE", "THYROIDAB" in the last 72 hours.   Anemia Panel: No results for input(s): "VITAMINB12", "FOLATE", "FERRITIN", "TIBC", "IRON", "RETICCTPCT" in the last 72 hours.  Urine analysis:    Component Value Date/Time   COLORURINE YELLOW 01/24/2023 0336   APPEARANCEUR HAZY (A) 01/24/2023 0336   LABSPEC 1.018 01/24/2023 0336   PHURINE 5.0 01/24/2023 0336   GLUCOSEU >=500 (A) 01/24/2023 0336   HGBUR SMALL (A) 01/24/2023 0336   HGBUR negative 11/11/2007 1104   BILIRUBINUR NEGATIVE 01/24/2023 0336   KETONESUR 20 (A) 01/24/2023 0336   PROTEINUR NEGATIVE 01/24/2023 0336   UROBILINOGEN 0.2 11/18/2014 2105   NITRITE NEGATIVE 01/24/2023 0336   LEUKOCYTESUR NEGATIVE 01/24/2023 0336    Sepsis Labs: Lactic Acid, Venous    Component Value Date/Time   LATICACIDVEN 1.9 01/23/2023 1716    MICROBIOLOGY: Recent Results (from the past 240 hour(s))  Culture, blood (routine x 2)     Status: None   Collection Time: 01/23/23  1:00 PM   Specimen: BLOOD  Result Value Ref Range Status   Specimen Description BLOOD RIGHT ANTECUBITAL  Final   Special Requests   Final    BOTTLES DRAWN AEROBIC AND ANAEROBIC Blood Culture results may not be optimal due to an inadequate volume of blood received in culture bottles   Culture   Final    NO GROWTH 5  DAYS Performed at [facility], [address] [place], [place] [address]    Report Status 01/28/2023 FINAL  Final  Culture, blood (routine x 2)     Status: None   Collection Time: 01/23/23  1:19 PM   Specimen: BLOOD  Result Value Ref Range Status   Specimen Description BLOOD BLOOD LEFT ARM  Final   Special Requests   Final    BOTTLES DRAWN AEROBIC AND ANAEROBIC Blood Culture adequate volume   Culture   Final  NO GROWTH 5 DAYS Performed at [facility], [address] [place], [place] [address]    Report Status 01/28/2023 FINAL  Final  SARS Coronavirus 2 by RT PCR (hospital order, performed in [facility] hospital lab) *cepheid single result test* Anterior Nasal Swab     Status: None   Collection Time: 01/23/23  1:19 PM   Specimen: Anterior Nasal Swab  Result Value Ref Range Status   SARS Coronavirus 2 by RT PCR NEGATIVE NEGATIVE Final    Comment: Performed at [facility], [address], [place], [place] [address]  Urine Culture     Status: None   Collection Time: 01/24/23  3:39 AM   Specimen: Urine, Clean Catch  Result Value Ref Range Status   Specimen Description URINE, CLEAN CATCH  Final   Special Requests NONE  Final   Culture   Final    NO GROWTH Performed at [facility], [address] [place], [place] [address]    Report Status 01/25/2023 FINAL  Final    RADIOLOGY STUDIES/RESULTS: No results found.   LOS: 7 days   Jeoffrey Massed, MD  [facility]    To contact the attending provider between 7A-7P or the covering provider during after hours 7P-7A, please log into the web site [email or web address] and access using universal [place] password for that web site. If you do not have the password, please call the hospital operator.  01/30/2023, 11:05 AM

## 2023-01-30 NOTE — Evaluation (Signed)
Occupational Therapy Evaluation Patient Details Name: Samantha [name] MRN: [id number] DOB: Nov 13, 1945 Today's Date: 01/30/2023   History of Present Illness 77 yo female presenting to ED 8/13 with increased confusion and progressive weakness over the last 24 hours. Found to have sepsis after recent UTI diagnosis. CTH negative. AKI 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, UTI, HTN, T2DM, HLDL, chronic lower back pain, CKDIII   Clinical Impression   Pt s/p above diagnosis. Pt states no pain, A/Ox4 with increased time answering time/situation, conversational, able to follow commands and answer all other questions appropriately. Pt lives with grandson in 1 story house, PLOF mod I. Pt currently requires strong mod A in/out of bed, for sit to stand. Pt displays L lateral leaning with sitting and standing limiting participation in ADLs and decreases safety with ADLs/mobility. Pt would benefit greatly from continued skilled therapy to improve strength/endurance, sitting/standing balance to increased participation in ADLs, postacute therapy <3hrs/day recommended.       If plan is discharge home, recommend the following: A lot of help with walking and/or transfers;A lot of help with bathing/dressing/bathroom;Assistance with cooking/housework;Assist for transportation;Help with stairs or ramp for entrance    Functional Status Assessment  Patient has had a recent decline in their functional status and demonstrates the ability to make significant improvements in function in a reasonable and predictable amount of time.  Equipment Recommendations  Other (comment) (defer)    Recommendations for Other Services       Precautions / Restrictions Precautions Precautions: Fall Restrictions Weight Bearing Restrictions: No      Mobility Bed Mobility Overal bed mobility: Needs Assistance Bed Mobility: Supine to Sit, Sit to Supine     Supine to sit: Mod assist Sit to supine:  Max assist   General bed mobility comments: mod-max A for in/out of bed    Transfers Overall transfer level: Needs assistance     Sit to Stand: Mod assist           General transfer comment: stood at bedside, did not pivot or take steps, strong L lateral lean      Balance Overall balance assessment: Needs assistance Sitting-balance support: Single extremity supported, Feet supported Sitting balance-Leahy Scale: Poor Sitting balance - Comments: poor EOB balance, L lateral/posterior lean Postural control: Posterior lean, Left lateral lean Standing balance support: Bilateral upper extremity supported, Reliant on assistive device for balance Standing balance-Leahy Scale: Poor Standing balance comment: mod A to power, L lateral lean standing, not able to correct while standing                           ADL either performed or assessed with clinical judgement   ADL Overall ADL's : Needs assistance/impaired Eating/Feeding: Independent;Sitting   Grooming: Set up;Sitting   Upper Body Bathing: Minimal assistance;Sitting   Lower Body Bathing: Maximal assistance;Sitting/lateral leans   Upper Body Dressing : Minimal assistance;Sitting   Lower Body Dressing: Maximal assistance;Sit to/from stand                 General ADL Comments: Pt requires significant assistance for LB ADLs, difficulty sitting on EOB with L lateral lean, mod A to maintain balance sitting. Pt able to stand with mod A, L lateral lean while standing.     Vision Baseline Vision/History: 0 No visual deficits Ability to See in Adequate Light: 0 Adequate Patient Visual Report: No change from baseline       Perception  Praxis         Pertinent Vitals/Pain Pain Assessment Pain Assessment: No/denies pain     Extremity/Trunk Assessment Upper Extremity Assessment Upper Extremity Assessment: Generalized weakness   Lower Extremity Assessment Lower Extremity Assessment: Defer to  PT evaluation       Communication Communication Communication: No apparent difficulties   Cognition Arousal: Alert Behavior During Therapy: WFL for tasks assessed/performed Overall Cognitive Status: Within Functional Limits for tasks assessed                                 General Comments: A/Ox4 today with increased time for date/situation, conversational, feeling better     General Comments       Exercises     Shoulder Instructions      Home Living Family/patient expects to be discharged to:: Private residence Living Arrangements: Other relatives Available Help at Discharge: Family;Available PRN/intermittently;Other (Comment) Type of Home: House Home Access: Ramped entrance;Stairs to enter Entrance Stairs-Number of Steps: 1   Home Layout: One level     Bathroom Shower/Tub: Tub/shower unit;Curtain   Bathroom Toilet: Standard Bathroom Accessibility: Yes   Home Equipment: Agricultural consultant (2 wheels)   Additional Comments: Pt lives at home with grandson who works during the day.      Prior Functioning/Environment Prior Level of Function : Independent/Modified Independent             Mobility Comments: pt mod I in the home with RW, cane .  Could go to the bathroom, fix something to eat etc. ADLs Comments: pt was I with ADL's,        OT Problem List: Decreased strength;Decreased range of motion;Decreased activity tolerance;Impaired balance (sitting and/or standing);Decreased cognition;Decreased safety awareness;Decreased knowledge of use of DME or AE;Impaired UE functional use      OT Treatment/Interventions: Self-care/ADL training;Therapeutic exercise;Energy conservation;DME and/or AE instruction;Therapeutic activities;Patient/family education    OT Goals(Current goals can be found in the care plan section) Acute Rehab OT Goals Patient Stated Goal: to improve functional strength, return to PLOF OT Goal Formulation: With patient/family Time  For Goal Achievement: 02/13/23 Potential to Achieve Goals: Good  OT Frequency: Min 1X/week    Co-evaluation              AM-PAC OT "6 Clicks" Daily Activity     Outcome Measure Help from another person eating meals?: None Help from another person taking care of personal grooming?: A Little Help from another person toileting, which includes using toliet, bedpan, or urinal?: A Lot Help from another person bathing (including washing, rinsing, drying)?: A Lot Help from another person to put on and taking off regular upper body clothing?: A Little Help from another person to put on and taking off regular lower body clothing?: A Lot 6 Click Score: 16   End of Session Equipment Utilized During Treatment: Gait belt;Rolling walker (2 wheels) Nurse Communication: Mobility status  Activity Tolerance: Patient tolerated treatment well Patient left: in bed;with call bell/phone within reach;with nursing/sitter in room;with family/visitor present  OT Visit Diagnosis: Unsteadiness on feet (R26.81);Other abnormalities of gait and mobility (R26.89);Muscle weakness (generalized) (M62.81);Other symptoms and signs involving cognitive function                Time: 1710-1739 OT Time Calculation (min): 29 min Charges:  OT General Charges $OT Visit: 1 Visit OT Evaluation $OT Eval Moderate Complexity: 1 Mod OT Treatments $Self Care/Home Management : 8-22 mins  Jossie Smoot  [place], OTR/L   Alexis Goodell 01/30/2023, 5:50 PM

## 2023-01-30 NOTE — Progress Notes (Signed)
   Patient Name: Samantha [name] Date of Encounter: 01/30/2023 [facility] Cardiologist: Charlton Haws, MD   Interval Summary  .    No CV complaints. No significant arrhythmia, other than rare PVCs. BP high early AM, but in great range now.  Vital Signs .    Vitals:   01/30/23 0000 01/30/23 0425 01/30/23 0500 01/30/23 0834  BP: 121/72 (!) 175/84  (!) 146/76  Pulse: 88 94  94  Resp: 15 13  16   Temp: 98.4 F (36.9 C) 97.9 F (36.6 C)  98 F (36.7 C)  TempSrc: Oral Oral  Oral  SpO2: 100% 98%  100%  Weight:   68.4 kg   Height:        Intake/Output Summary (Last 24 hours) at 01/30/2023 1043 Last data filed at 01/30/2023 0314 Gross per 24 hour  Intake 1062.82 ml  Output 300 ml  Net 762.82 ml      01/30/2023    5:00 AM 01/27/2023    4:27 AM 01/26/2023    5:00 AM  Last 3 Weights  Weight (lbs) 150 lb 12.7 oz 141 lb 8.6 oz 141 lb 1.5 oz  Weight (kg) 68.4 kg 64.2 kg 64 kg      Telemetry/ECG    NSR, rare PVCs - Personally Reviewed  Physical Exam .   GEN: No acute distress.   Neck: No JVD Cardiac: RRR, no murmurs, rubs, or gallops.  Respiratory: Clear to auscultation bilaterally. GI: Soft, nontender, non-distended  MS: No edema  Assessment & Plan .     [facility] will sign off.   Medication Recommendations:  metoprolol 50 mg twice daily, apixaban 5 mg twice daily (stop aspirin) Can resume her lisinopril if BP is high. Other recommendations (labs, testing, etc):  Can resume her lisinopril if BP is high. Follow up as an outpatient:  F/U w Dr. Eden Emms or APP in 1-2 months  For questions or updates, please contact [facility] Please consult [email or web address] for contact info under        Signed, Thurmon Fair, MD

## 2023-01-30 NOTE — TOC Progression Note (Signed)
Transition of Care [facility]) - Progression Note    Patient Details  Name: Samantha [name] MRN: [id number] Date of Birth: 09-24-1945  Transition of [facility] CM/SW Contact  Erin Sons, [place] Phone Number: 01/30/2023, 11:21 AM  Clinical Narrative:     CSW met with pt, son, and daughter bedside. They choose [facility]. CSW explained insurance auth process.   CSW confirmed bed with [facility]. Pt's insurance is not managed by [facility] so [facility] will start the insurance auth.   Expected Discharge Plan: Skilled Nursing Facility Barriers to Discharge: Insurance Authorization  Expected Discharge Plan and Services In-house Referral: Clinical Social Work     Living arrangements for the past 2 months: Single Family Home                                       Social Determinants of Health (SDOH) Interventions SDOH Screenings   Food Insecurity: No Food Insecurity (01/24/2023)  Housing: Low Risk  (01/24/2023)  Transportation Needs: No Transportation Needs (01/24/2023)  Utilities: Not At Risk (01/24/2023)  Alcohol Screen: Low Risk  (11/16/2022)  Depression (PHQ2-9): Medium Risk (11/16/2022)  Financial Resource Strain: Low Risk  (11/16/2022)  Physical Activity: Inactive (11/16/2022)  Social Connections: Moderately Integrated (11/16/2022)  Stress: No Stress Concern Present (11/16/2022)  Tobacco Use: Low Risk  (01/23/2023)    Readmission Risk Interventions     No data to display

## 2023-01-31 DIAGNOSIS — I480 Paroxysmal atrial fibrillation: Secondary | ICD-10-CM | POA: Diagnosis not present

## 2023-01-31 DIAGNOSIS — A419 Sepsis, unspecified organism: Secondary | ICD-10-CM | POA: Diagnosis not present

## 2023-01-31 DIAGNOSIS — I471 Supraventricular tachycardia, unspecified: Secondary | ICD-10-CM | POA: Diagnosis not present

## 2023-01-31 DIAGNOSIS — E440 Moderate protein-calorie malnutrition: Secondary | ICD-10-CM | POA: Diagnosis not present

## 2023-01-31 LAB — GLUCOSE, CAPILLARY
Glucose-Capillary: 122 mg/dL — ABNORMAL HIGH (ref 70–99)
Glucose-Capillary: 220 mg/dL — ABNORMAL HIGH (ref 70–99)
Glucose-Capillary: 193 mg/dL — ABNORMAL HIGH (ref 70–99)

## 2023-01-31 MED ORDER — INSULIN GLARGINE-YFGN 100 UNIT/ML ~~LOC~~ SOLN: 12.0000 [IU] | Freq: Every day | SUBCUTANEOUS | Status: DC

## 2023-01-31 MED ORDER — INSULIN ASPART 100 UNIT/ML IJ SOLN: INTRAMUSCULAR | Status: DC

## 2023-01-31 MED ORDER — METOPROLOL TARTRATE 50 MG PO TABS: 50.0000 mg | ORAL_TABLET | Freq: Two times a day (BID) | ORAL | Status: DC

## 2023-01-31 MED ORDER — METHIMAZOLE 10 MG PO TABS: 10.0000 mg | ORAL_TABLET | Freq: Two times a day (BID) | ORAL | Status: DC

## 2023-01-31 MED ORDER — MELATONIN 3 MG PO TABS: 3.0000 mg | ORAL_TABLET | Freq: Every evening | ORAL | Status: DC | PRN

## 2023-01-31 MED ORDER — APIXABAN 5 MG PO TABS: 5.0000 mg | ORAL_TABLET | Freq: Two times a day (BID) | ORAL | Status: DC

## 2023-01-31 MED ORDER — LOPERAMIDE HCL 1 MG/7.5ML PO SUSP: 2.0000 mg | ORAL | Status: DC | PRN

## 2023-01-31 MED ORDER — ENSURE ENLIVE PO LIQD: 237.0000 mL | Freq: Two times a day (BID) | ORAL | Status: DC

## 2023-01-31 NOTE — Plan of Care (Signed)
  Problem: Education: Goal: Ability to describe self-care measures that may prevent or decrease complications (Diabetes Survival Skills Education) will improve Outcome: Progressing Goal: Individualized Educational Video(s) Outcome: Progressing   Problem: Coping: Goal: Ability to adjust to condition or change in health will improve Outcome: Progressing   Problem: Fluid Volume: Goal: Ability to maintain a balanced intake and output will improve Outcome: Progressing   Problem: Health Behavior/Discharge Planning: Goal: Ability to identify and utilize available resources and services will improve Outcome: Progressing Goal: Ability to manage health-related needs will improve Outcome: Progressing   Problem: Metabolic: Goal: Ability to maintain appropriate glucose levels will improve Outcome: Progressing   Problem: Nutritional: Goal: Maintenance of adequate nutrition will improve Outcome: Progressing Goal: Progress toward achieving an optimal weight will improve Outcome: Progressing   Problem: Skin Integrity: Goal: Risk for impaired skin integrity will decrease Outcome: Progressing   Problem: Tissue Perfusion: Goal: Adequacy of tissue perfusion will improve Outcome: Progressing   Problem: Education: Goal: Knowledge of General Education information will improve Description: Including pain rating scale, medication(s)/side effects and non-pharmacologic comfort measures Outcome: Progressing   Problem: Health Behavior/Discharge Planning: Goal: Ability to manage health-related needs will improve Outcome: Progressing   Problem: Clinical Measurements: Goal: Ability to maintain clinical measurements within normal limits will improve Outcome: Progressing Goal: Will remain free from infection Outcome: Progressing Goal: Diagnostic test results will improve Outcome: Progressing Goal: Respiratory complications will improve Outcome: Progressing Goal: Cardiovascular complication will  be avoided Outcome: Progressing   Problem: Activity: Goal: Risk for activity intolerance will decrease Outcome: Progressing   Problem: Nutrition: Goal: Adequate nutrition will be maintained Outcome: Progressing   Problem: Coping: Goal: Level of anxiety will decrease Outcome: Progressing   Problem: Elimination: Goal: Will not experience complications related to bowel motility Outcome: Progressing Goal: Will not experience complications related to urinary retention Outcome: Progressing   Problem: Pain Managment: Goal: General experience of comfort will improve Outcome: Progressing   Problem: Safety: Goal: Ability to remain free from injury will improve Outcome: Progressing   Problem: Skin Integrity: Goal: Risk for impaired skin integrity will decrease Outcome: Progressing   Problem: Education: Goal: Ability to describe self-care measures that may prevent or decrease complications (Diabetes Survival Skills Education) will improve Outcome: Progressing Goal: Individualized Educational Video(s) Outcome: Progressing   Problem: Cardiac: Goal: Ability to maintain an adequate cardiac output will improve Outcome: Progressing   Problem: Health Behavior/Discharge Planning: Goal: Ability to identify and utilize available resources and services will improve Outcome: Progressing Goal: Ability to manage health-related needs will improve Outcome: Progressing   Problem: Fluid Volume: Goal: Ability to achieve a balanced intake and output will improve Outcome: Progressing   Problem: Metabolic: Goal: Ability to maintain appropriate glucose levels will improve Outcome: Progressing   Problem: Nutritional: Goal: Maintenance of adequate nutrition will improve Outcome: Progressing Goal: Maintenance of adequate weight for body size and type will improve Outcome: Progressing   Problem: Respiratory: Goal: Will regain and/or maintain adequate ventilation Outcome: Progressing    Problem: Urinary Elimination: Goal: Ability to achieve and maintain adequate renal perfusion and functioning will improve Outcome: Progressing   

## 2023-01-31 NOTE — Discharge Summary (Signed)
PATIENT DETAILS Name: Samantha [name] Age: 77 y.o. Sex: female Date of Birth: 09-Apr-1946 MRN: [id number]. Admitting Physician: Lurline Del, MD WUJ:[id number], Devonne Doughty, NP  Admit Date: 01/23/2023 Discharge date: 02/01/2023  Recommendations for Outpatient Follow-up:  Follow up with PCP in 1-2 weeks Please obtain CMP/CBC in one week Would avoid SGLT 2 inhibitors in the future-was on Jardiance-had euglycemic DKA and severe electrolyte abnormalities. Follow-up with endocrinologist-Dr. Fransico Him Ensure follow-up with cardiology  Admitted From:  Home  Disposition: Skilled nursing facility   Discharge Condition: good  CODE STATUS:   Code Status: Full Code   Diet recommendation:  Diet Order             Diet - low sodium heart healthy           Diet Carb Modified           DIET DYS 2 Room service appropriate? No; Fluid consistency: Thin  Diet effective now                    Brief Summary: Patient is a 77 y.o.  female with history of DM-2, HTN, hyperthyroidism-s/p RAI radioactive iodine ablation 06/24-who was apparently diagnosed with a UTI-initially on Bactrim-then changed to Augmentin-presented with altered mental status x 2-3 days along with very poor oral intake.  Patient was found to have AKI/metabolic acidosis and hypernatremia-and subsequently admitted to the hospitalist service.   Significant events: 8/13>> admit to [facility] 8/14>> started insulin GTT for euglycemic DKA 8/15>> A-fib RVR-no response to IV Lopressor, started Cardizem infusion-cardiology consult. 8/16>> more awake/alert compared to past few days-maintaining sinus rhythm-failed SLP eval-Cortrak ordered 8/19>>Cortrak removed   Significant studies: 8/13>> CT head: No acute intracranial abnormality 8/13>> CXR: No obvious PNA 8/13>> renal ultrasound: No hydronephrosis 8/15>> echo: EF> 75%.   Significant microbiology data: 7/25>> urine culture: Klebsiella 8/13>> COVID PCR: Negative 8/13>> blood cultures:  Negative 8/14>> urine culture: Negative   Procedures: None   Consults: Cardiology  Brief Hospital Course: Acute metabolic encephalopathy Likely due to hypernatremia/AKI/possible euglycemic DKA Encephalopathy resolved after treatment of underlying etiologies Delirium precautions Supportive care.     Paroxysmal atrial fibrillation with RVR Occurred on 8/15-rates in the 180s-190s at times-subsequently converted back to sinus rhythm Maintaining sinus rhythm-no longer on Cardizem and heparin infusion-has been switched to oral beta-blocker and Eliquis  Ensure follow up with cardiology post discharge   AKI Likely hemodynamically mediated (poor oral intake and lisinopril use), euglycemic DKA and recent Bactrim use Resolved with IVF and treatment of underlying euglycemic DKA.   Hypernatremia Secondary to poor oral intake in the setting of recent UTI Sodium levels have normalized after treatment initially with half-normal and then D5W-and most recently through free water through PEG tube.   Euglycemic DKA from Jardiance use Resolved with IV insulin infusion Has been transitioned to SQ insulin Would not rechallenge with Jardiance in the future.   DM-2 (A1c 9.8 on 8/30) CBGs relatively stable-Semglee 12 units and SSI     Hypercalcemia Suspect this is mostly due to dehydration/hemoconcentration Resolved with IVF.   Hypophosphatemia Repleted.   Hypokalemia Replete/recheck   Hypomagnesemia Replete   Recent complicated UTI with Klebsiella pneumoniae Initially on Bactrim-then switched to Augmentin (as an outpatient) Doubt sepsis physiology-do not think it was present on admission Encephalopathy was mostly from severe electrolyte abnormalities Urine culture negative-Rocephin x 5 days completed on 8/17-does not require any further antibiotics.   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 Nida-endocrinology on 8/16-recommendations were to start Tapazole. Already on beta-blocker Follow-up with endocrine as an outpatient   HTN BP stable-continue metoprolol   Oropharyngeal dysphagia Likely due to debility/deconditioning in the setting of acute/critical illness Cortrak tube placed on 8/16-reevaluated by SLP on 8/19-dysphagia 2 diet started-which she is tolerating-an NG tube was removed on 8/19. Show SLP follow-up at SNF for advancement in diet-currently tolerating dysphagia 2 diet well.   Debility/deconditioning Due to critical illness PT/OT eval-SNF recommended.   Nutrition Status: Nutrition Problem: Moderate Malnutrition Etiology: chronic illness Signs/Symptoms: percent weight loss, mild fat depletion, moderate muscle depletion Percent weight loss: 22 % Interventions: Ensure Enlive (each supplement provides 350kcal and 20 grams of protein), Tube feeding    BMI: Estimated body mass index is 28.49 kg/m as calculated from the following:   Height as of this encounter: 5\' 1"  (1.549 m).   Weight as of this encounter: 68.4 kg.    Discharge Diagnoses:  Principal Problem:   Sepsis secondary to UTI [facility]) Active Problems:   Malnutrition of moderate degree   Paroxysmal atrial fibrillation (HCC)   SVT (supraventricular tachycardia)   Discharge Instructions:  Activity:  As tolerated with Full fall precautions use walker/cane & assistance as needed   Discharge Instructions     Call MD for:  difficulty breathing, headache or visual disturbances   Complete by: As directed    Call MD for:  extreme fatigue   Complete by: As directed    Call MD for:  persistant dizziness or light-headedness   Complete by: As directed    Diet - low sodium heart healthy   Complete by: As directed    Diet Carb Modified   Complete by: As directed    Discharge instructions   Complete by: As directed    Follow with Primary MD  Hillery Aldo, NP in 1-2 weeks  Follow-up with  cardiology-the office will call with a follow-up appointment  Follow-up with endocrinologist-Dr. Nida-in the next 1-2 weeks.  Please get a complete blood count and chemistry panel checked by your Primary MD at your next visit, and again as instructed by your Primary MD.  Get Medicines reviewed and adjusted: Please take all your medications with you for your next visit with your Primary MD  Laboratory/radiological data: Please request your Primary MD to go over all hospital tests and procedure/radiological results at the follow up, please ask your Primary MD to get all Hospital records sent to his/her office.  In some cases, they will be blood work, cultures and biopsy results pending at the time of your discharge. Please request that your primary care M.D. follows up on these results.  Also Note the following: If you experience worsening of your admission symptoms, develop shortness of breath, life threatening emergency, suicidal or homicidal thoughts you must seek medical attention immediately by calling 911 or calling your MD immediately  if symptoms less severe.  You must read complete instructions/literature along with all the possible adverse reactions/side effects for all the Medicines you take and that have been prescribed to you. Take any new Medicines after you have completely understood and accpet all the possible adverse reactions/side effects.   Do not drive when taking Pain medications or sleeping medications (Benzodaizepines)  Do not take more than prescribed Pain, Sleep and Anxiety Medications. It is not advisable to combine anxiety,sleep and pain medications without talking with your primary care practitioner  Special Instructions: If you have smoked or chewed Tobacco  in the last 2 yrs please stop smoking, stop any regular Alcohol  and or any Recreational drug use.  Wear Seat belts while driving.  Please note: You were cared for by a hospitalist during your hospital stay.  Once you are discharged, your primary care physician will handle any further medical issues. Please note that NO REFILLS for any discharge medications will be authorized once you are discharged, as it is imperative that you return to your primary care physician (or establish a relationship with a primary care physician if you do not have one) for your post hospital discharge needs so that they can reassess your need for medications and monitor your lab values.   Check CBGs before meals and at bedtime   Increase activity slowly   Complete by: As directed       Allergies as of 02/01/2023       Reactions   Jardiance [empagliflozin] Other (See Comments)   Euglycemic DKA        Medication List     STOP taking these medications    amoxicillin-clavulanate 500-125 MG tablet Commonly known as: AUGMENTIN   Jardiance 25 MG Tabs tablet Generic drug: empagliflozin   lisinopril 20 MG tablet Commonly known as: ZESTRIL   methocarbamol 500 MG tablet Commonly known as: ROBAXIN   oxyCODONE-acetaminophen 5-325 MG tablet Commonly known as: PERCOCET/ROXICET   sulfamethoxazole-trimethoprim 400-80 MG tablet Commonly known as: BACTRIM       TAKE these medications    acetaminophen 500 MG tablet Commonly known as: TYLENOL Take 500-1,000 mg by mouth every 6 (six) hours as needed for mild pain.   apixaban 5 MG Tabs tablet Commonly known as: ELIQUIS Take 1 tablet (5 mg total) by mouth 2 (two) times daily.   feeding supplement Liqd Take 237 mLs by mouth 2 (two) times daily between meals.   insulin aspart 100 UNIT/ML injection Commonly known as: novoLOG 0-15 Units, Subcutaneous, 3 times daily with meals, CBG < 70: Implement Hypoglycemia measures CBG 70 - 120: 0 units CBG 121 - 150: 2 units CBG 151 - 200: 3 units CBG 201 - 250: 5 units CBG 251 - 300: 8 units CBG 301 - 350: 11 units CBG 351 - 400: 15 units CBG > 400: call MD a   insulin glargine-yfgn 100 UNIT/ML injection Commonly known as:  SEMGLEE Inject 0.12 mLs (12 Units total) into the skin daily.   loperamide HCl 1 MG/7.5ML suspension Commonly known as: IMODIUM Take 15 mLs (2 mg total) by mouth as needed for diarrhea or loose stools.   melatonin 3 MG Tabs tablet Take 1 tablet (3 mg total) by mouth at bedtime as needed (insomnia).   metFORMIN 500 MG 24 hr tablet Commonly known as: GLUCOPHAGE-XR Take 500 mg by mouth daily.   methimazole 10 MG tablet Commonly known as: TAPAZOLE Take 1 tablet (10 mg total) by mouth 2 (two) times daily.   metoprolol tartrate 50 MG tablet Commonly known as: LOPRESSOR Take 1 tablet (50 mg total) by mouth 2 (two) times daily. What changed:  medication strength how much to take   ondansetron 4 MG tablet Commonly known as: ZOFRAN Take 1 tablet (4 mg total) by mouth every 8 (eight) hours as needed for nausea or vomiting.   OVER THE COUNTER MEDICATION Take 1 tablet by mouth at bedtime as needed (leg cramps). Medication: legatrin pm        Follow-up Information     Wendall Stade, MD Follow up on 04/23/2023.  Specialty: Cardiology Why: at 2:15 pm  please arrive 10 min early to check in Contact information: [address] [place] [address] [phone]         Hillery Aldo, NP. Schedule an appointment as soon as possible for a visit in 1 week(s).   Contact information: [address] [place] [address] [phone]         Roma Kayser, MD. Schedule an appointment as soon as possible for a visit in 2 week(s).   Specialty: Endocrinology Contact information: [address] [place] [address] [phone]                Allergies  Allergen Reactions   Jardiance [Empagliflozin] Other (See Comments)    Euglycemic DKA     Other Procedures/Studies: DG Abd Portable 1V  Result Date: 01/26/2023 CLINICAL DATA:  Status post enteric tube placement. EXAM: PORTABLE ABDOMEN - 1 VIEW COMPARISON:  01/23/2023 FINDINGS: Interval placement of a  weighted feeding tube. The tip is well below the level of the GE junction projecting over the right upper quadrant of the abdomen. This is either in the distal stomach or proximal duodenum. No dilated bowel loops identified. IMPRESSION: Weighted feeding tube tip is well below the level of the GE junction projecting over the right upper quadrant of the abdomen. Electronically Signed   By: Signa Kell M.D.   On: 01/26/2023 15:51   ECHOCARDIOGRAM COMPLETE  Result Date: 01/24/2023    ECHOCARDIOGRAM REPORT   Patient Name:   DALEYSSA PULLIAM Date of Exam: 01/24/2023 Medical Rec #:  [id number]   Height:       61.0 in Accession #:    [id number]  Weight:       140.2 lb Date of Birth:  10-05-1945   BSA:          1.624 m Patient Age:    77 years    BP:           142/79 mmHg Patient Gender: F           HR:           122 bpm. Exam Location:  Inpatient Procedure: 2D Echo, Cardiac Doppler and Color Doppler Indications:    Elevated troponin  History:        Patient has no prior history of Echocardiogram examinations.                 CKD, Signs/Symptoms:Fatigue; Risk Factors:Dyslipidemia,                 Hypertension and Diabetes. Sepsis.  Sonographer:    Wallie Char Referring Phys: Skip Mayer, A IMPRESSIONS  1. Elevated LVOT peak velocity of 4 m/s likely due to hyperdynamic LV function and chordal SAM.  2. Left ventricular ejection fraction, by estimation, is >75%. The left ventricle has hyperdynamic function. The left ventricle has no regional wall motion abnormalities. There is mild concentric left ventricular hypertrophy. Left ventricular diastolic parameters are consistent with Grade I diastolic dysfunction (impaired relaxation).  3. Right ventricular systolic function is normal. The right ventricular size is normal.  4. The mitral valve is normal in structure. No evidence of mitral valve regurgitation. No evidence of mitral stenosis.  5. The aortic valve is normal in structure. Aortic valve regurgitation is not  visualized. No aortic stenosis is present.  6. The inferior vena cava is dilated in size with >50% respiratory variability, suggesting right atrial pressure of 8 mmHg. Comparison(s): No prior Echocardiogram. FINDINGS  Left Ventricle: Left ventricular ejection fraction, by estimation, is >75%. The left ventricle has hyperdynamic function. The left ventricle has no regional wall motion abnormalities. The left ventricular internal cavity size was normal in size. There is mild concentric left ventricular hypertrophy. Left ventricular diastolic parameters are consistent with Grade I diastolic dysfunction (impaired relaxation). Right Ventricle: The right ventricular size is normal. Right ventricular systolic function is normal. Left Atrium: Left atrial size was normal in size. Right Atrium: Right atrial size was normal in size. Pericardium: Trivial pericardial effusion is present. Mitral Valve: The mitral valve is normal in structure. No evidence of mitral valve regurgitation. No evidence of mitral valve stenosis. MV peak gradient, 6.2 mmHg. The mean mitral valve gradient is 3.0 mmHg. Tricuspid Valve: The tricuspid valve is normal in structure. Tricuspid valve regurgitation is mild . No evidence of tricuspid stenosis. Aortic Valve: The aortic valve is normal in structure. Aortic valve regurgitation is not visualized. No aortic stenosis is present. Aortic valve mean gradient measures 10.0 mmHg. Aortic valve peak gradient measures 17.3 mmHg. Aortic valve area, by VTI measures 1.57 cm. Pulmonic Valve: The pulmonic valve was normal in structure. Pulmonic valve regurgitation is not visualized. No evidence of pulmonic stenosis. Aorta: The aortic root is normal in size and structure. Venous: The inferior vena cava is dilated in size with greater than 50% respiratory variability, suggesting right atrial pressure of 8 mmHg. IAS/Shunts: No atrial level shunt detected by color flow Doppler. Additional Comments: Elevated LVOT peak  velocity of 4 m/s likely due to hyperdynamic LV function and chordal SAM.  LEFT VENTRICLE PLAX 2D LVIDd:         3.50 cm     Diastology LVIDs:         2.00 cm     LV e' medial:    6.81 cm/s LV PW:         0.90 cm     LV E/e' medial:  13.4 LV IVS:        0.70 cm     LV e' lateral:   6.73 cm/s LVOT diam:     1.50 cm     LV E/e' lateral: 13.6 LV SV:         45 LV SV Index:   28 LVOT Area:     1.77 cm  LV Volumes (MOD) LV vol d, MOD A2C: 42.3 ml LV vol d, MOD A4C: 57.2 ml LV vol s, MOD A2C: 11.1 ml LV vol s, MOD A4C: 12.6 ml LV SV MOD A2C:     31.2 ml LV SV MOD A4C:     57.2 ml LV SV MOD BP:      37.5 ml RIGHT VENTRICLE             IVC RV Basal diam:  2.90 cm     IVC diam: 2.30 cm RV S prime:     21.00 cm/s TAPSE (M-mode): 2.1 cm LEFT ATRIUM             Index        RIGHT ATRIUM          Index LA diam:        3.20 cm 1.97 cm/m   RA Area:     8.47 cm LA Vol (A2C):   36.6 ml 22.54 ml/m  RA Volume:   15.80 ml 9.73 ml/m LA Vol (A4C):   37.9 ml 23.34 ml/m LA Biplane Vol: 39.4 ml 24.26 ml/m  AORTIC VALVE AV Area (Vmax):  1.60 cm AV Area (Vmean):   1.55 cm AV Area (VTI):     1.57 cm AV Vmax:           208.00 cm/s AV Vmean:          145.500 cm/s AV VTI:            0.288 m AV Peak Grad:      17.3 mmHg AV Mean Grad:      10.0 mmHg LVOT Vmax:         188.00 cm/s LVOT Vmean:        127.500 cm/s LVOT VTI:          0.256 m LVOT/AV VTI ratio: 0.89 MITRAL VALVE                TRICUSPID VALVE MV Area (PHT): 3.89 cm     TR Peak grad:   36.5 mmHg MV Area VTI:   2.18 cm     TR Vmax:        302.00 cm/s MV Peak grad:  6.2 mmHg MV Mean grad:  3.0 mmHg     SHUNTS MV Vmax:       1.24 m/s     Systemic VTI:  0.26 m MV Vmean:      74.9 cm/s    Systemic Diam: 1.50 cm MV Decel Time: 195 msec MV E velocity: 91.30 cm/s MV A velocity: 136.00 cm/s MV E/A ratio:  0.67 Olga Millers MD Electronically signed by Olga Millers MD Signature Date/Time: 01/24/2023/10:45:00 AM    Final    US RENAL  Result Date: 01/23/2023 CLINICAL DATA:   Urinary tract infection. EXAM: RENAL / URINARY TRACT ULTRASOUND COMPLETE COMPARISON:  Ultrasound dated 12/02/2020. FINDINGS: Right Kidney: Renal measurements: 8.2 x 4.3 x 4.5 cm = volume: 83 mL. Normal echogenicity. No hydronephrosis or shadowing stone. Left Kidney: Renal measurements: 9.2 x 5.1 x 5.7 cm = volume: 139 mL. Normal echogenicity. No hydronephrosis or shadowing stone. Bladder: Appears normal for degree of bladder distention. Other: None. IMPRESSION: Unremarkable renal ultrasound. Electronically Signed   By: Elgie Collard M.D.   On: 01/23/2023 20:20   DG CHEST PORT 1 VIEW  Result Date: 01/23/2023 CLINICAL DATA:  Cough. Altered mental status. Treatment for urinary tract infection for 4 days. EXAM: PORTABLE CHEST 1 VIEW COMPARISON:  CT 01/19/2023 FINDINGS: Shallow inspiration. Linear atelectasis or fibrosis in both lung bases. No developing consolidation or edema. Normal heart size. Mediastinal contours appear intact. No pleural effusion or pneumothorax. Degenerative changes in the spine and shoulders. IMPRESSION: Shallow inspiration with linear fibrosis or atelectasis in the lung bases. Electronically Signed   By: Burman Nieves M.D.   On: 01/23/2023 17:53   CT Head Wo Contrast  Result Date: 01/23/2023 CLINICAL DATA:  Mental status change, unknown cause EXAM: CT HEAD WITHOUT CONTRAST TECHNIQUE: Contiguous axial images were obtained from the base of the skull through the vertex without intravenous contrast. RADIATION DOSE REDUCTION: This exam was performed according to the departmental dose-optimization program which includes automated exposure control, adjustment of the mA and/or kV according to patient size and/or use of iterative reconstruction technique. COMPARISON:  CT Head 11/14/11 FINDINGS: Brain: No evidence of acute infarction, hemorrhage, hydrocephalus, extra-axial collection or mass lesion/mass effect. Vascular: No hyperdense vessel or unexpected calcification. Skull: Normal. Negative  for fracture or focal lesion. Sinuses/Orbits: No middle ear or mastoid effusion. Paranasal sinuses are clear. Bilateral lens replacement. Orbits are otherwise unremarkable. Other: None. IMPRESSION: No acute intracranial abnormality. Electronically Signed  By: Lorenza Cambridge M.D.   On: 01/23/2023 16:14   CT ABDOMEN PELVIS W CONTRAST  Result Date: 01/19/2023 CLINICAL DATA:  Concern for pulmonary embolism.  Abdominal pain. EXAM: CT ANGIOGRAPHY CHEST CT ABDOMEN AND PELVIS WITH CONTRAST TECHNIQUE: Multidetector CT imaging of the chest was performed using the standard protocol during bolus administration of intravenous contrast. Multiplanar CT image reconstructions and MIPs were obtained to evaluate the vascular anatomy. Multidetector CT imaging of the abdomen and pelvis was performed using the standard protocol during bolus administration of intravenous contrast. RADIATION DOSE REDUCTION: This exam was performed according to the departmental dose-optimization program which includes automated exposure control, adjustment of the mA and/or kV according to patient size and/or use of iterative reconstruction technique. CONTRAST:  60mL OMNIPAQUE IOHEXOL 350 MG/ML SOLN COMPARISON:  CT abdomen pelvis dated 07/29/2022. FINDINGS: CTA CHEST FINDINGS Cardiovascular: There is no cardiomegaly or pericardial effusion. Mild atherosclerotic calcification of the thoracic aorta. No aneurysmal dilatation or dissection. No pulmonary artery embolus identified. Mediastinum/Nodes: No hilar or mediastinal adenopathy. The esophagus is grossly unremarkable. 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 This has been evaluated on previous imaging. (ref: J Am Coll Radiol. 2015 Feb;12(2): 143-50).No mediastinal fluid collection. Lungs/Pleura: Minimal bibasilar linear atelectasis. No focal consolidation, pleural effusion, or pneumothorax. The central airways are patent. Musculoskeletal: Degenerative changes of the spine. No acute  osseous pathology. Review of the MIP images confirms the above findings. CT ABDOMEN and PELVIS FINDINGS No intra-abdominal free air or free fluid. Hepatobiliary: Subcentimeter hepatic hypodense lesions are too small to characterize but likely cysts. These are similar to prior CTs dating back to 2019. No imaging follow-up. No biliary dilatation. The gallbladder is unremarkable. Pancreas: Unremarkable. No pancreatic ductal dilatation or surrounding inflammatory changes. Spleen: Normal in size without focal abnormality. Adrenals/Urinary Tract: The adrenal glands are unremarkable. The kidneys, visualized ureters, and urinary bladder appear unremarkable. Stomach/Bowel: There is scattered colonic diverticula without active inflammatory changes. There is no bowel obstruction or active inflammation. The appendix is normal. Vascular/Lymphatic: Mild aortoiliac atherosclerotic disease. The IVC is unremarkable. No portal venous gas. There is no adenopathy. Reproductive: The uterus is anteverted. The endometrium is thickened measuring 8 mm. Evaluation of the pelvic structures is limited by CT. Endometrial thickening may represent hyperplasia, polyp, or neoplasm. Further evaluation with pelvic ultrasound on a nonemergent/outpatient basis recommended. No adnexal masses. Other: None Musculoskeletal: Osteopenia with degenerative changes of the spine. No acute osseous pathology. Review of the MIP images confirms the above findings. IMPRESSION: 1. No acute intrathoracic, abdominal, or pelvic pathology. No CT evidence of pulmonary embolism. 2. Colonic diverticulosis. No bowel obstruction. Normal appendix. 3. Thickened endometrium. Further evaluation with pelvic ultrasound on a nonemergent/outpatient basis recommended. 4.  Aortic Atherosclerosis (ICD10-I70.0). Electronically Signed   By: Elgie Collard M.D.   On: 01/19/2023 19:21   CT Angio Chest PE W and/or Wo Contrast  Result Date: 01/19/2023 CLINICAL DATA:  Concern for pulmonary  embolism.  Abdominal pain. EXAM: CT ANGIOGRAPHY CHEST CT ABDOMEN AND PELVIS WITH CONTRAST TECHNIQUE: Multidetector CT imaging of the chest was performed using the standard protocol during bolus administration of intravenous contrast. Multiplanar CT image reconstructions and MIPs were obtained to evaluate the vascular anatomy. Multidetector CT imaging of the abdomen and pelvis was performed using the standard protocol during bolus administration of intravenous contrast. RADIATION DOSE REDUCTION: This exam was performed according to the departmental dose-optimization program which includes automated exposure control, adjustment of the mA and/or kV according to patient size and/or use of iterative  reconstruction technique. CONTRAST:  60mL OMNIPAQUE IOHEXOL 350 MG/ML SOLN COMPARISON:  CT abdomen pelvis dated 07/29/2022. FINDINGS: CTA CHEST FINDINGS Cardiovascular: There is no cardiomegaly or pericardial effusion. Mild atherosclerotic calcification of the thoracic aorta. No aneurysmal dilatation or dissection. No pulmonary artery embolus identified. Mediastinum/Nodes: No hilar or mediastinal adenopathy. The esophagus is grossly unremarkable. 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 This has been evaluated on previous imaging. (ref: J Am Coll Radiol. 2015 Feb;12(2): 143-50).No mediastinal fluid collection. Lungs/Pleura: Minimal bibasilar linear atelectasis. No focal consolidation, pleural effusion, or pneumothorax. The central airways are patent. Musculoskeletal: Degenerative changes of the spine. No acute osseous pathology. Review of the MIP images confirms the above findings. CT ABDOMEN and PELVIS FINDINGS No intra-abdominal free air or free fluid. Hepatobiliary: Subcentimeter hepatic hypodense lesions are too small to characterize but likely cysts. These are similar to prior CTs dating back to 2019. No imaging follow-up. No biliary dilatation. The gallbladder is unremarkable. Pancreas: Unremarkable.  No pancreatic ductal dilatation or surrounding inflammatory changes. Spleen: Normal in size without focal abnormality. Adrenals/Urinary Tract: The adrenal glands are unremarkable. The kidneys, visualized ureters, and urinary bladder appear unremarkable. Stomach/Bowel: There is scattered colonic diverticula without active inflammatory changes. There is no bowel obstruction or active inflammation. The appendix is normal. Vascular/Lymphatic: Mild aortoiliac atherosclerotic disease. The IVC is unremarkable. No portal venous gas. There is no adenopathy. Reproductive: The uterus is anteverted. The endometrium is thickened measuring 8 mm. Evaluation of the pelvic structures is limited by CT. Endometrial thickening may represent hyperplasia, polyp, or neoplasm. Further evaluation with pelvic ultrasound on a nonemergent/outpatient basis recommended. No adnexal masses. Other: None Musculoskeletal: Osteopenia with degenerative changes of the spine. No acute osseous pathology. Review of the MIP images confirms the above findings. IMPRESSION: 1. No acute intrathoracic, abdominal, or pelvic pathology. No CT evidence of pulmonary embolism. 2. Colonic diverticulosis. No bowel obstruction. Normal appendix. 3. Thickened endometrium. Further evaluation with pelvic ultrasound on a nonemergent/outpatient basis recommended. 4.  Aortic Atherosclerosis (ICD10-I70.0). Electronically Signed   By: Elgie Collard M.D.   On: 01/19/2023 19:21   CT Cervical Spine Wo Contrast  Result Date: 01/04/2023 CLINICAL DATA:  Neck pain for 2 months, initial encounter EXAM: CT CERVICAL SPINE WITHOUT CONTRAST TECHNIQUE: Multidetector CT imaging of the cervical spine was performed without intravenous contrast. Multiplanar CT image reconstructions were also generated. RADIATION DOSE REDUCTION: This exam was performed according to the departmental dose-optimization program which includes automated exposure control, adjustment of the mA and/or kV according  to patient size and/or use of iterative reconstruction technique. COMPARISON:  None Available. FINDINGS: Alignment: Mild straightening of the normal cervical lordosis is noted. Skull base and vertebrae: 7 cervical segments are well visualized. Vertebral body height is well maintained. Multilevel osteophytic changes and facet hypertrophic changes are noted. No acute fracture or acute facet abnormality is noted. The odontoid is within normal limits. Soft tissues and spinal canal: Surrounding soft tissue structures are within normal limits. 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 Upper chest: Visualized lung apices are within normal limits. Other: None IMPRESSION: Multilevel degenerative changes are noted. No acute abnormality is seen. Electronically Signed   By: Alcide Clever M.D.   On: 01/04/2023 17:23     TODAY-DAY OF DISCHARGE:  Subjective:   Samantha [name] today has no headache,no chest abdominal pain,no new weakness tingling or numbness, feels much better wants to go home today.   Objective:   Blood pressure 103/69, pulse 100, temperature 97.8 F (36.6 C), temperature  source Oral, resp. rate 18, height 5\' 1"  (1.549 m), weight 65.7 kg, SpO2 100%.  Intake/Output Summary (Last 24 hours) at 02/01/2023 0704 Last data filed at 01/31/2023 1800 Gross per 24 hour  Intake 240 ml  Output 950 ml  Net -710 ml   Filed Weights   01/30/23 0500 01/31/23 0500 02/01/23 0500  Weight: 68.4 kg 70.2 kg 65.7 kg    Exam: Awake Alert, Oriented *3, No new F.N deficits, Normal affect [place].AT,PERRAL Supple Neck,No JVD, No cervical lymphadenopathy appriciated.  Symmetrical Chest wall movement, Good air movement bilaterally, CTAB RRR,No Gallops,Rubs or new Murmurs, No Parasternal Heave +ve B.Sounds, Abd Soft, Non tender, No organomegaly appriciated, No rebound -guarding or rigidity. No Cyanosis, Clubbing or edema, No new Rash or bruise   PERTINENT RADIOLOGIC STUDIES: No results  found.   PERTINENT LAB RESULTS: CBC: No results for input(s): "WBC", "HGB", "HCT", "PLT" in the last 72 hours.  CMET CMP     Component Value Date/Time   NA 143 01/30/2023 0236   K 4.2 01/30/2023 0236   CL 112 (H) 01/30/2023 0236   CO2 23 01/30/2023 0236   GLUCOSE 151 (H) 01/30/2023 0236   BUN 12 01/30/2023 0236   CREATININE 0.99 02/01/2023 0532   CALCIUM 9.7 01/30/2023 0236   PROT 6.3 (L) 01/24/2023 0142   ALBUMIN 2.6 (L) 01/24/2023 0142   AST 17 01/24/2023 0142   ALT 17 01/24/2023 0142   ALKPHOS 70 01/24/2023 0142   BILITOT 0.9 01/24/2023 0142   GFRNONAA 59 (L) 02/01/2023 0532    GFR Estimated Creatinine Clearance: 42 mL/min (by C-G formula based on SCr of 0.99 mg/dL). No results for input(s): "LIPASE", "AMYLASE" in the last 72 hours. No results for input(s): "CKTOTAL", "CKMB", "CKMBINDEX", "TROPONINI" in the last 72 hours. Invalid input(s): "POCBNP" No results for input(s): "DDIMER" in the last 72 hours. No results for input(s): "HGBA1C" in the last 72 hours. No results for input(s): "CHOL", "HDL", "LDLCALC", "TRIG", "CHOLHDL", "LDLDIRECT" in the last 72 hours. No results for input(s): "TSH", "T4TOTAL", "T3FREE", "THYROIDAB" in the last 72 hours.  Invalid input(s): "FREET3" No results for input(s): "VITAMINB12", "FOLATE", "FERRITIN", "TIBC", "IRON", "RETICCTPCT" in the last 72 hours. Coags: No results for input(s): "INR" in the last 72 hours.  Invalid input(s): "PT" Microbiology: Recent Results (from the past 240 hour(s))  Culture, blood (routine x 2)     Status: None   Collection Time: 01/23/23  1:00 PM   Specimen: BLOOD  Result Value Ref Range Status   Specimen Description BLOOD RIGHT ANTECUBITAL  Final   Special Requests   Final    BOTTLES DRAWN AEROBIC AND ANAEROBIC Blood Culture results may not be optimal due to an inadequate volume of blood received in culture bottles   Culture   Final    NO GROWTH 5 DAYS Performed at [facility], [address] [place], [place] [address]    Report Status 01/28/2023 FINAL  Final  Culture, blood (routine x 2)     Status: None   Collection Time: 01/23/23  1:19 PM   Specimen: BLOOD  Result Value Ref Range Status   Specimen Description BLOOD BLOOD LEFT ARM  Final   Special Requests   Final    BOTTLES DRAWN AEROBIC AND ANAEROBIC Blood Culture adequate volume   Culture   Final    NO GROWTH 5 DAYS Performed at [facility], [address], [place], [place] [address]    Report Status 01/28/2023 FINAL  Final  SARS Coronavirus 2 by RT PCR (hospital order, performed in [facility] hospital lab) *cepheid single result test* Anterior Nasal Swab     Status: None   Collection Time: 01/23/23  1:19 PM   Specimen: Anterior Nasal Swab  Result Value Ref Range Status   SARS Coronavirus 2 by RT PCR NEGATIVE NEGATIVE Final    Comment: Performed at [facility], [address] [place], [place] [address]  Urine Culture     Status: None   Collection Time: 01/24/23  3:39 AM   Specimen: Urine, Clean Catch  Result Value Ref Range Status   Specimen Description URINE, CLEAN CATCH  Final   Special Requests NONE  Final   Culture   Final    NO GROWTH Performed at [facility], [address] [place], [place] [address]    Report Status 01/25/2023 FINAL  Final    FURTHER DISCHARGE INSTRUCTIONS:  Get Medicines reviewed and adjusted: Please take all your medications with you for your next visit with your Primary MD  Laboratory/radiological data: Please request your Primary MD to go over all hospital tests and procedure/radiological results at the follow up, please ask your Primary MD to get all Hospital records sent to his/her office.  In some cases, they will be blood work, cultures and biopsy results pending at the time of your discharge. Please request that your primary care M.D. goes through all the records of your hospital data and follows up on these results.  Also Note the following: If you  experience worsening of your admission symptoms, develop shortness of breath, life threatening emergency, suicidal or homicidal thoughts you must seek medical attention immediately by calling 911 or calling your MD immediately  if symptoms less severe.  You must read complete instructions/literature along with all the possible adverse reactions/side effects for all the Medicines you take and that have been prescribed to you. Take any new Medicines after you have completely understood and accpet all the possible adverse reactions/side effects.   Do not drive when taking Pain medications or sleeping medications (Benzodaizepines)  Do not take more than prescribed Pain, Sleep and Anxiety Medications. It is not advisable to combine anxiety,sleep and pain medications without talking with your primary care practitioner  Special Instructions: If you have smoked or chewed Tobacco  in the last 2 yrs please stop smoking, stop any regular Alcohol  and or any Recreational drug use.  Wear Seat belts while driving.  Please note: You were cared for by a hospitalist during your hospital stay. Once you are discharged, your primary care physician will handle any further medical issues. Please note that NO REFILLS for any discharge medications will be authorized once you are discharged, as it is imperative that you return to your primary care physician (or establish a relationship with a primary care physician if you do not have one) for your post hospital discharge needs so that they can reassess your need for medications and monitor your lab values.  Total Time spent coordinating discharge including counseling, education and face to face time equals greater than 30 minutes.  SignedJeoffrey Massed 02/01/2023 7:04 AM

## 2023-01-31 NOTE — TOC Progression Note (Signed)
Transition of Care [facility]) - Progression Note    Patient Details  Name: Samantha [name] MRN: [id number] Date of Birth: Oct 16, 1945  Transition of Care [facility]) CM/SW Contact  Erin Sons, [place] Phone Number: 01/31/2023, 2:06 PM  Clinical Narrative:     Insurance auth pending at this time. CSW updated pt's daughter [name].   Expected Discharge Plan: Skilled Nursing Facility Barriers to Discharge: Insurance Authorization  Expected Discharge Plan and Services In-house Referral: Clinical Social Work     Living arrangements for the past 2 months: Single Family Home Expected Discharge Date: 01/31/23                                     Social Determinants of Health (SDOH) Interventions SDOH Screenings   Food Insecurity: No Food Insecurity (01/24/2023)  Housing: Patient Declined (01/30/2023)  Transportation Needs: Patient Declined (01/30/2023)  Utilities: Patient Declined (01/30/2023)  Alcohol Screen: Low Risk  (11/16/2022)  Depression (PHQ2-9): Medium Risk (11/16/2022)  Financial Resource Strain: Low Risk  (11/16/2022)  Physical Activity: Inactive (11/16/2022)  Social Connections: Moderately Integrated (11/16/2022)  Stress: No Stress Concern Present (11/16/2022)  Tobacco Use: Low Risk  (01/23/2023)    Readmission Risk Interventions     No data to display

## 2023-02-01 ENCOUNTER — Other Ambulatory Visit (HOSPITAL_COMMUNITY): Payer: Self-pay

## 2023-02-01 DIAGNOSIS — A419 Sepsis, unspecified organism: Secondary | ICD-10-CM | POA: Diagnosis not present

## 2023-02-01 DIAGNOSIS — N390 Urinary tract infection, site not specified: Secondary | ICD-10-CM | POA: Diagnosis not present

## 2023-02-01 LAB — GLUCOSE, CAPILLARY
Glucose-Capillary: 142 mg/dL — ABNORMAL HIGH (ref 70–99)
Glucose-Capillary: 278 mg/dL — ABNORMAL HIGH (ref 70–99)

## 2023-02-01 LAB — CREATININE, SERUM
Creatinine, Ser: 0.99 mg/dL (ref 0.44–1.00)
GFR, Estimated: 59 mL/min — ABNORMAL LOW (ref >60)

## 2023-02-01 MED ORDER — APIXABAN 5 MG PO TABS
5.0000 mg | ORAL_TABLET | Freq: Two times a day (BID) | ORAL | 1 refills | Status: DC
Start: 2023-02-01 — End: 2023-12-10
  Filled 2023-02-01: qty 60, 30d supply, fill #0

## 2023-02-01 NOTE — TOC Transition Note (Signed)
Transition of Care [facility]) - CM/SW Discharge Note   Patient Details  Name: Samantha [name] MRN: [id number] Date of Birth: 1946/03/23  [facility]) CM/SW Contact:  Erin Sons, LCSW Phone Number: 02/01/2023, 11:02 AM   Clinical Narrative:     [facility] is approved  Per MD patient ready for DC to [facility]. RN, patient, patient's family, and facility notified of DC. Discharge Summary and FL2 sent to facility. RN to call report prior to discharge [phone] ). DC packet on chart. Ambulance transport requested for patient.   CSW will sign off for now as social work intervention is no longer needed. Please consult [place] again if new needs arise.   Final next level of care: Skilled Nursing Facility Barriers to Discharge: No Barriers Identified     Discharge Placement                Patient chooses bed at: [facility] Patient to be transferred to facility by: [facility] Name of family member notified: Daughter [name] Patient and family notified of of transfer: 02/01/23  Discharge Plan and Services Additional resources added to the After Visit Summary for   In-house Referral: Clinical Social Work                                   Social Determinants of Health (SDOH) Interventions SDOH Screenings   Food Insecurity: No Food Insecurity (01/24/2023)  Housing: Patient Declined (01/30/2023)  Transportation Needs: Patient Declined (01/30/2023)  Utilities: Patient Declined (01/30/2023)  Alcohol Screen: Low Risk  (11/16/2022)  Depression (PHQ2-9): Medium Risk (11/16/2022)  Financial Resource Strain: Low Risk  (11/16/2022)  Physical Activity: Inactive (11/16/2022)  Social Connections: Moderately Integrated (11/16/2022)  Stress: No Stress Concern Present (11/16/2022)  Tobacco Use: Low Risk  (01/23/2023)     Readmission Risk Interventions     No data to display

## 2023-02-01 NOTE — Progress Notes (Signed)
PROGRESS NOTE        PATIENT DETAILS Name: Samantha [name] Age: 77 y.o. Sex: female Date of Birth: Nov 10, 1945 Admit Date: 01/23/2023 Admitting Physician Lurline Del, MD VHQ:IONGE, Devonne Doughty, NP  Brief Summary: Patient is a 77 y.o.  female with history of DM-2, HTN, hyperthyroidism-s/p RAI radioactive iodine ablation 06/24-who was apparently diagnosed with a UTI-initially on Bactrim-then changed to Augmentin-presented with altered mental status x 2-3 days along with very poor oral intake.  Patient was found to have AKI/metabolic acidosis and hypernatremia-and subsequently admitted to the hospitalist service.  Significant events: 8/13>> admit to [facility] 8/14>> started insulin GTT for euglycemic DKA 8/15>> A-fib RVR-no response to IV Lopressor, started Cardizem infusion-cardiology consult. 8/16>> more awake/alert compared to past few days-maintaining sinus rhythm-failed SLP eval-Cortrak ordered 8/19>>Cortrak removed  Significant studies: 8/13>> CT head: No acute intracranial abnormality 8/13>> CXR: No obvious PNA 8/13>> renal ultrasound: No hydronephrosis 8/15>> echo: EF> 75%.  Significant microbiology data: 7/25>> urine culture: Klebsiella 8/13>> COVID PCR: Negative 8/13>> blood cultures: Negative 8/14>> urine culture: Negative  Procedures: None  Consults: None  Subjective: Awake/alert-no complaints-frustrated that she is still here-awaiting insurance authorization for SNF bed.  Objective: Vitals: Blood pressure 103/69, pulse 100, temperature 97.8 F (36.6 C), temperature source Oral, resp. rate 18, height 5\' 1"  (1.549 m), weight 65.7 kg, SpO2 100%.   Exam: Awake/alert Soft nontender No edema Nonfocal Speech clear  Pertinent Labs/Radiology:    Latest Ref Rng & Units 01/29/2023    2:39 AM 01/28/2023    4:23 AM 01/27/2023    7:22 AM  CBC  WBC 4.0 - 10.5 K/uL 7.2  7.3  8.4   Hemoglobin 12.0 - 15.0 g/dL 8.8  9.5  9.7   Hematocrit 36.0 - 46.0  % 30.0  31.6  31.8   Platelets 150 - 400 K/uL 153  159  166     Lab Results  Component Value Date   NA 143 01/30/2023   K 4.2 01/30/2023   CL 112 (H) 01/30/2023   CO2 23 01/30/2023      Assessment/Plan: Acute metabolic encephalopathy Likely due to hypernatremia/AKI/possible euglycemic DKA Encephalopathy resolved after treatment of underlying etiologies Delirium precautions Supportive care.     Paroxysmal atrial fibrillation with RVR Occurred on 8/15-rates in the 180s-190s at times-subsequently converted back to sinus rhythm Maintaining sinus rhythm-no longer on Cardizem and heparin infusion-has been switched to oral beta-blocker and Eliquis  Ensure follow up with cardiology post discharge   AKI Likely hemodynamically mediated (poor oral intake and lisinopril use), euglycemic DKA and recent Bactrim use Resolved with IVF and treatment of underlying euglycemic DKA.   Hypernatremia Secondary to poor oral intake in the setting of recent UTI Sodium levels have normalized after treatment initially with half-normal and then D5W-and most recently through free water through PEG tube.   Euglycemic DKA from Jardiance use Resolved with IV insulin infusion Has been transitioned to SQ insulin Would not rechallenge with Jardiance in the future.   DM-2 (A1c 9.8 on 8/30) CBGs relatively stable-Semglee 12 units and SSI     Recent Labs    01/31/23 1727 02/01/23 0123 02/01/23 0855  GLUCAP 193* 142* 278*      Hypercalcemia Suspect this is mostly due to dehydration/hemoconcentration Resolved with IVF.   Hypophosphatemia Repleted.   Hypokalemia Replete/recheck   Hypomagnesemia Replete   Recent complicated UTI  with Klebsiella pneumoniae Initially on Bactrim-then switched to Augmentin (as an outpatient) Doubt sepsis physiology-do not think it was present on admission Encephalopathy was mostly from severe electrolyte abnormalities Urine culture negative-Rocephin x 5 days  completed on 8/17-does not require any further antibiotics.   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 Nida-endocrinology on 8/16-recommendations were to start Tapazole. Already on beta-blocker Follow-up with endocrine as an outpatient   HTN BP stable-continue metoprolol   Oropharyngeal dysphagia Likely due to debility/deconditioning in the setting of acute/critical illness Cortrak tube placed on 8/16-reevaluated by SLP on 8/19-dysphagia 2 diet started-which she is tolerating-an NG tube was removed on 8/19. Show SLP follow-up at SNF for advancement in diet-currently tolerating dysphagia 2 diet well.   Debility/deconditioning Due to critical illness PT/OT eval-SNF recommended.   Nutrition Status: Nutrition Problem: Moderate Malnutrition Etiology: chronic illness Signs/Symptoms: percent weight loss, mild fat depletion, moderate muscle depletion Percent weight loss: 22 % Interventions: Ensure Enlive (each supplement provides 350kcal and 20 grams of protein), Tube feeding    BMI: Estimated body mass index is 28.49 kg/m as calculated from the following:   Height as of this encounter: 5\' 1"  (1.549 m).   Weight as of this encounter: 68.4 kg.    Code status:   Code Status: Full Code   DVT Prophylaxis: SCDs Start: 01/25/23 0805 apixaban (ELIQUIS) tablet 5 mg    Family Communication: None at bedside.  Disposition Plan: Status is: Inpatient Remains inpatient appropriate because: Severity of illness   Planned Discharge Destination: SNF when bed available.  Diet: Diet Order             Diet - low sodium heart healthy           Diet Carb Modified           DIET DYS 2 Room service appropriate? No; Fluid consistency: Thin  Diet effective now                     Antimicrobial agents: Anti-infectives (From admission, onward)    Start     Dose/Rate Route Frequency  Ordered Stop   01/24/23 1500  cefTRIAXone (ROCEPHIN) 1 g in sodium chloride 0.9 % 100 mL IVPB        1 g 200 mL/hr over 30 Minutes Intravenous Every 24 hours 01/23/23 1836 01/27/23 1511   01/23/23 1430  cefTRIAXone (ROCEPHIN) 2 g in sodium chloride 0.9 % 100 mL IVPB  Status:  Discontinued        2 g 200 mL/hr over 30 Minutes Intravenous Every 24 hours 01/23/23 1417 01/23/23 1835        MEDICATIONS: Scheduled Meds:  apixaban  5 mg Oral BID   feeding supplement  237 mL Oral BID BM   insulin aspart  0-15 Units Subcutaneous TID WC   insulin glargine-yfgn  12 Units Subcutaneous Daily   methIMAzole  10 mg Oral BID   metoprolol tartrate  50 mg Oral BID   Continuous Infusions:   PRN Meds:.acetaminophen **OR** acetaminophen, albuterol, dextrose, loperamide HCl, melatonin, ondansetron **OR** ondansetron (ZOFRAN) IV   I have personally reviewed following labs and imaging studies  LABORATORY DATA: CBC: Recent Labs  Lab 01/26/23 0547 01/27/23 0722 01/28/23 0423 01/29/23 0239  WBC 9.2 8.4 7.3 7.2  HGB 9.8* 9.7* 9.5* 8.8*  HCT 32.5* 31.8* 31.6* 30.0*  MCV 82.1 83.9 84.5 83.3  PLT 196 166 159 153  Basic Metabolic Panel: Recent Labs  Lab 01/26/23 0547 01/26/23 1403 01/27/23 0722 01/28/23 0423 01/29/23 0239 01/30/23 0236 02/01/23 0532  NA 156* 148* 152* 150* 142 143  --   K 3.6 3.6 3.6 4.0 3.3* 4.2  --   CL 128* 121* 120* 124* 113* 112*  --   CO2 20* 18* 19* 20* 20* 23  --   GLUCOSE 178* 332* 242* 244* 263* 151*  --   BUN 13 12 14 13 12 12   --   CREATININE 1.16* 0.98 0.90 0.83 0.76 0.79 0.99  CALCIUM 10.6* 9.8 9.8 9.4 9.0 9.7  --   MG 2.0  --  1.8 2.0 1.6* 2.2  --   PHOS 1.5* 2.9 2.8 2.4* 3.2 4.5  --     GFR: Estimated Creatinine Clearance: 42 mL/min (by C-G formula based on SCr of 0.99 mg/dL).  Liver Function Tests: No results for input(s): "AST", "ALT", "ALKPHOS", "BILITOT", "PROT", "ALBUMIN" in the last 168 hours.  No results for input(s): "LIPASE",  "AMYLASE" in the last 168 hours.  No results for input(s): "AMMONIA" in the last 168 hours.  Coagulation Profile: No results for input(s): "INR", "PROTIME" in the last 168 hours.  Cardiac Enzymes: No results for input(s): "CKTOTAL", "CKMB", "CKMBINDEX", "TROPONINI" in the last 168 hours.  BNP (last 3 results) No results for input(s): "PROBNP" in the last 8760 hours.  Lipid Profile: No results for input(s): "CHOL", "HDL", "LDLCALC", "TRIG", "CHOLHDL", "LDLDIRECT" in the last 72 hours.  Thyroid Function Tests: No results for input(s): "TSH", "T4TOTAL", "FREET4", "T3FREE", "THYROIDAB" in the last 72 hours.   Anemia Panel: No results for input(s): "VITAMINB12", "FOLATE", "FERRITIN", "TIBC", "IRON", "RETICCTPCT" in the last 72 hours.  Urine analysis:    Component Value Date/Time   COLORURINE YELLOW 01/24/2023 0336   APPEARANCEUR HAZY (A) 01/24/2023 0336   LABSPEC 1.018 01/24/2023 0336   PHURINE 5.0 01/24/2023 0336   GLUCOSEU >=500 (A) 01/24/2023 0336   HGBUR SMALL (A) 01/24/2023 0336   HGBUR negative 11/11/2007 1104   BILIRUBINUR NEGATIVE 01/24/2023 0336   KETONESUR 20 (A) 01/24/2023 0336   PROTEINUR NEGATIVE 01/24/2023 0336   UROBILINOGEN 0.2 11/18/2014 2105   NITRITE NEGATIVE 01/24/2023 0336   LEUKOCYTESUR NEGATIVE 01/24/2023 0336    Sepsis Labs: Lactic Acid, Venous    Component Value Date/Time   LATICACIDVEN 1.9 01/23/2023 1716    MICROBIOLOGY: Recent Results (from the past 240 hour(s))  Culture, blood (routine x 2)     Status: None   Collection Time: 01/23/23  1:00 PM   Specimen: BLOOD  Result Value Ref Range Status   Specimen Description BLOOD RIGHT ANTECUBITAL  Final   Special Requests   Final    BOTTLES DRAWN AEROBIC AND ANAEROBIC Blood Culture results may not be optimal due to an inadequate volume of blood received in culture bottles   Culture   Final    NO GROWTH 5 DAYS Performed at [facility], [address], [place], [place] [address]     Report Status 01/28/2023 FINAL  Final  Culture, blood (routine x 2)     Status: None   Collection Time: 01/23/23  1:19 PM   Specimen: BLOOD  Result Value Ref Range Status   Specimen Description BLOOD BLOOD LEFT ARM  Final   Special Requests   Final    BOTTLES DRAWN AEROBIC AND ANAEROBIC Blood Culture adequate volume   Culture   Final    NO GROWTH 5 DAYS Performed at [facility],  [address] [place], [place] [address]    Report Status 01/28/2023 FINAL  Final  SARS Coronavirus 2 by RT PCR (hospital order, performed in [facility] hospital lab) *cepheid single result test* Anterior Nasal Swab     Status: None   Collection Time: 01/23/23  1:19 PM   Specimen: Anterior Nasal Swab  Result Value Ref Range Status   SARS Coronavirus 2 by RT PCR NEGATIVE NEGATIVE Final    Comment: Performed at [facility], [address] [place], [place] [address]  Urine Culture     Status: None   Collection Time: 01/24/23  3:39 AM   Specimen: Urine, Clean Catch  Result Value Ref Range Status   Specimen Description URINE, CLEAN CATCH  Final   Special Requests NONE  Final   Culture   Final    NO GROWTH Performed at [facility], [address], [place], [place] [address]    Report Status 01/25/2023 FINAL  Final    RADIOLOGY STUDIES/RESULTS: No results found.   LOS: 9 days   Jeoffrey Massed, MD  [facility]    To contact the attending provider between 7A-7P or the covering provider during after hours 7P-7A, please log into the web site [email or web address] and access using universal [place] password for that web site. If you do not have the password, please call the hospital operator.  02/01/2023, 9:32 AM

## 2023-02-08 DIAGNOSIS — I480 Paroxysmal atrial fibrillation: Secondary | ICD-10-CM | POA: Diagnosis not present

## 2023-02-08 DIAGNOSIS — E440 Moderate protein-calorie malnutrition: Secondary | ICD-10-CM | POA: Diagnosis not present

## 2023-02-08 DIAGNOSIS — E0590 Thyrotoxicosis, unspecified without thyrotoxic crisis or storm: Secondary | ICD-10-CM | POA: Diagnosis not present

## 2023-02-08 DIAGNOSIS — E1122 Type 2 diabetes mellitus with diabetic chronic kidney disease: Secondary | ICD-10-CM | POA: Diagnosis not present

## 2023-02-14 DIAGNOSIS — M25532 Pain in left wrist: Secondary | ICD-10-CM | POA: Diagnosis not present

## 2023-02-14 DIAGNOSIS — Z7689 Persons encountering health services in other specified circumstances: Secondary | ICD-10-CM | POA: Diagnosis not present

## 2023-03-06 ENCOUNTER — Ambulatory Visit: Payer: Medicare HMO | Admitting: "Endocrinology

## 2023-03-07 ENCOUNTER — Encounter (HOSPITAL_COMMUNITY): Payer: Self-pay

## 2023-03-07 ENCOUNTER — Emergency Department (HOSPITAL_COMMUNITY): Payer: Medicare HMO

## 2023-03-07 ENCOUNTER — Other Ambulatory Visit: Payer: Self-pay

## 2023-03-07 ENCOUNTER — Inpatient Hospital Stay (HOSPITAL_COMMUNITY)
Admission: EM | Admit: 2023-03-07 | Discharge: 2023-03-12 | DRG: 547 | Disposition: A | Discharge status: Home Health | Payer: Medicare HMO | Attending: Infectious Diseases | Admitting: Infectious Diseases

## 2023-03-07 DIAGNOSIS — E876 Hypokalemia: Secondary | ICD-10-CM | POA: Diagnosis present

## 2023-03-07 DIAGNOSIS — E8729 Other acidosis: Secondary | ICD-10-CM

## 2023-03-07 DIAGNOSIS — M02331 Reiter's disease, right wrist: Principal | ICD-10-CM

## 2023-03-07 DIAGNOSIS — E0500 Thyrotoxicosis with diffuse goiter without thyrotoxic crisis or storm: Secondary | ICD-10-CM | POA: Diagnosis present

## 2023-03-07 DIAGNOSIS — M25531 Pain in right wrist: Secondary | ICD-10-CM

## 2023-03-07 DIAGNOSIS — M545 Low back pain, unspecified: Secondary | ICD-10-CM | POA: Diagnosis present

## 2023-03-07 DIAGNOSIS — N1831 Chronic kidney disease, stage 3a: Secondary | ICD-10-CM | POA: Diagnosis present

## 2023-03-07 DIAGNOSIS — Z803 Family history of malignant neoplasm of breast: Secondary | ICD-10-CM | POA: Diagnosis not present

## 2023-03-07 DIAGNOSIS — E0590 Thyrotoxicosis, unspecified without thyrotoxic crisis or storm: Secondary | ICD-10-CM | POA: Diagnosis not present

## 2023-03-07 DIAGNOSIS — M009 Pyogenic arthritis, unspecified: Secondary | ICD-10-CM | POA: Diagnosis present

## 2023-03-07 DIAGNOSIS — R509 Fever, unspecified: Secondary | ICD-10-CM | POA: Diagnosis present

## 2023-03-07 DIAGNOSIS — E785 Hyperlipidemia, unspecified: Secondary | ICD-10-CM | POA: Diagnosis present

## 2023-03-07 DIAGNOSIS — E119 Type 2 diabetes mellitus without complications: Secondary | ICD-10-CM

## 2023-03-07 DIAGNOSIS — R7982 Elevated C-reactive protein (CRP): Secondary | ICD-10-CM | POA: Diagnosis present

## 2023-03-07 DIAGNOSIS — D631 Anemia in chronic kidney disease: Secondary | ICD-10-CM | POA: Diagnosis present

## 2023-03-07 DIAGNOSIS — M109 Gout, unspecified: Secondary | ICD-10-CM | POA: Diagnosis present

## 2023-03-07 DIAGNOSIS — I480 Paroxysmal atrial fibrillation: Secondary | ICD-10-CM | POA: Diagnosis present

## 2023-03-07 DIAGNOSIS — I129 Hypertensive chronic kidney disease with stage 1 through stage 4 chronic kidney disease, or unspecified chronic kidney disease: Secondary | ICD-10-CM | POA: Diagnosis present

## 2023-03-07 DIAGNOSIS — Z8249 Family history of ischemic heart disease and other diseases of the circulatory system: Secondary | ICD-10-CM

## 2023-03-07 DIAGNOSIS — E1122 Type 2 diabetes mellitus with diabetic chronic kidney disease: Secondary | ICD-10-CM | POA: Diagnosis present

## 2023-03-07 DIAGNOSIS — Z79899 Other long term (current) drug therapy: Secondary | ICD-10-CM | POA: Diagnosis not present

## 2023-03-07 DIAGNOSIS — G8929 Other chronic pain: Secondary | ICD-10-CM | POA: Diagnosis present

## 2023-03-07 DIAGNOSIS — Z7984 Long term (current) use of oral hypoglycemic drugs: Secondary | ICD-10-CM

## 2023-03-07 DIAGNOSIS — Z8349 Family history of other endocrine, nutritional and metabolic diseases: Secondary | ICD-10-CM

## 2023-03-07 DIAGNOSIS — D75839 Thrombocytosis, unspecified: Secondary | ICD-10-CM | POA: Diagnosis present

## 2023-03-07 DIAGNOSIS — Z888 Allergy status to other drugs, medicaments and biological substances status: Secondary | ICD-10-CM | POA: Diagnosis not present

## 2023-03-07 DIAGNOSIS — Z833 Family history of diabetes mellitus: Secondary | ICD-10-CM

## 2023-03-07 DIAGNOSIS — R000 Tachycardia, unspecified: Secondary | ICD-10-CM | POA: Diagnosis present

## 2023-03-07 DIAGNOSIS — Z7901 Long term (current) use of anticoagulants: Secondary | ICD-10-CM

## 2023-03-07 LAB — IRON AND TIBC
Iron: 8 ug/dL — ABNORMAL LOW (ref 28–170)
Saturation Ratios: 4 % — ABNORMAL LOW (ref 10.4–31.8)
TIBC: 192 ug/dL — ABNORMAL LOW (ref 250–450)
UIBC: 184 ug/dL

## 2023-03-07 LAB — T4, FREE: Free T4: >5.5 ng/dL — ABNORMAL HIGH (ref 0.61–1.12)

## 2023-03-07 LAB — CBC WITH DIFFERENTIAL/PLATELET
Abs Immature Granulocytes: 0.04 10*3/uL (ref 0.00–0.07)
Basophils Absolute: 0 10*3/uL (ref 0.0–0.1)
Basophils Relative: 0 %
Eosinophils Absolute: 0 10*3/uL (ref 0.0–0.5)
Eosinophils Relative: 0 %
HCT: 25.6 % — ABNORMAL LOW (ref 36.0–46.0)
Hemoglobin: 7.6 g/dL — ABNORMAL LOW (ref 12.0–15.0)
Immature Granulocytes: 1 %
Lymphocytes Relative: 16 %
Lymphs Abs: 1.4 10*3/uL (ref 0.7–4.0)
MCH: 26.4 pg (ref 26.0–34.0)
MCHC: 29.7 g/dL — ABNORMAL LOW (ref 30.0–36.0)
MCV: 88.9 fL (ref 80.0–100.0)
Monocytes Absolute: 0.8 10*3/uL (ref 0.1–1.0)
Monocytes Relative: 9 %
Neutro Abs: 6.5 10*3/uL (ref 1.7–7.7)
Neutrophils Relative %: 74 %
Platelets: 657 10*3/uL — ABNORMAL HIGH (ref 150–400)
RBC: 2.88 MIL/uL — ABNORMAL LOW (ref 3.87–5.11)
RDW: 20.9 % — ABNORMAL HIGH (ref 11.5–15.5)
WBC: 8.7 10*3/uL (ref 4.0–10.5)
nRBC: 0 % (ref 0.0–0.2)

## 2023-03-07 LAB — BASIC METABOLIC PANEL
Anion gap: 14 (ref 5–15)
BUN: <5 mg/dL — ABNORMAL LOW (ref 8–23)
CO2: 22 mmol/L (ref 22–32)
Calcium: 8.7 mg/dL — ABNORMAL LOW (ref 8.9–10.3)
Chloride: 102 mmol/L (ref 98–111)
Creatinine, Ser: 0.75 mg/dL (ref 0.44–1.00)
GFR, Estimated: >60 mL/min (ref >60)
Glucose, Bld: 183 mg/dL — ABNORMAL HIGH (ref 70–99)
Potassium: 2.9 mmol/L — ABNORMAL LOW (ref 3.5–5.1)
Sodium: 138 mmol/L (ref 135–145)

## 2023-03-07 LAB — COMPREHENSIVE METABOLIC PANEL
ALT: 9 U/L (ref 0–44)
AST: 12 U/L — ABNORMAL LOW (ref 15–41)
Albumin: 2.4 g/dL — ABNORMAL LOW (ref 3.5–5.0)
Alkaline Phosphatase: 76 U/L (ref 38–126)
Anion gap: 14 (ref 5–15)
BUN: <5 mg/dL — ABNORMAL LOW (ref 8–23)
CO2: 23 mmol/L (ref 22–32)
Calcium: 8.8 mg/dL — ABNORMAL LOW (ref 8.9–10.3)
Chloride: 103 mmol/L (ref 98–111)
Creatinine, Ser: 0.67 mg/dL (ref 0.44–1.00)
GFR, Estimated: >60 mL/min (ref >60)
Glucose, Bld: 166 mg/dL — ABNORMAL HIGH (ref 70–99)
Potassium: 2.5 mmol/L — CL (ref 3.5–5.1)
Sodium: 140 mmol/L (ref 135–145)
Total Bilirubin: 1.2 mg/dL (ref 0.3–1.2)
Total Protein: 6.8 g/dL (ref 6.5–8.1)

## 2023-03-07 LAB — C-REACTIVE PROTEIN: CRP: 20.2 mg/dL — ABNORMAL HIGH (ref <1.0)

## 2023-03-07 LAB — SEDIMENTATION RATE: Sed Rate: 95 mm/hr — ABNORMAL HIGH (ref 0–22)

## 2023-03-07 LAB — TSH: TSH: <0.01 u[IU]/mL — ABNORMAL LOW (ref 0.350–4.500)

## 2023-03-07 LAB — GLUCOSE, CAPILLARY
Glucose-Capillary: 142 mg/dL — ABNORMAL HIGH (ref 70–99)
Glucose-Capillary: 180 mg/dL — ABNORMAL HIGH (ref 70–99)

## 2023-03-07 LAB — FERRITIN: Ferritin: 337 ng/mL — ABNORMAL HIGH (ref 11–307)

## 2023-03-07 LAB — MAGNESIUM: Magnesium: 1.4 mg/dL — ABNORMAL LOW (ref 1.7–2.4)

## 2023-03-07 MED ORDER — LACTATED RINGERS IV BOLUS
500.0000 mL | Freq: Once | INTRAVENOUS | Status: AC
  Administered 2023-03-07: 500 mL via INTRAVENOUS

## 2023-03-07 MED ORDER — POTASSIUM CHLORIDE CRYS ER 20 MEQ PO TBCR
40.0000 meq | EXTENDED_RELEASE_TABLET | Freq: Once | ORAL | Status: AC
  Administered 2023-03-07: 40 meq via ORAL
  Filled 2023-03-07: qty 2

## 2023-03-07 MED ORDER — GADOBUTROL 1 MMOL/ML IV SOLN
7.0000 mL | Freq: Once | INTRAVENOUS | Status: AC | PRN
  Administered 2023-03-07: 7 mL via INTRAVENOUS

## 2023-03-07 MED ORDER — LABETALOL HCL 5 MG/ML IV SOLN
10.0000 mg | INTRAVENOUS | Status: DC | PRN
  Administered 2023-03-08 – 2023-03-09 (×3): 10 mg via INTRAVENOUS
  Filled 2023-03-07 (×2): qty 4

## 2023-03-07 MED ORDER — ACETAMINOPHEN 650 MG RE SUPP: 650.0000 mg | Freq: Four times a day (QID) | RECTAL | Status: DC | PRN

## 2023-03-07 MED ORDER — INSULIN ASPART 100 UNIT/ML IJ SOLN
0.0000 [IU] | INTRAMUSCULAR | Status: DC
  Administered 2023-03-07 – 2023-03-08 (×3): 2 [IU] via SUBCUTANEOUS
  Administered 2023-03-08: 3 [IU] via SUBCUTANEOUS
  Administered 2023-03-09 (×3): 2 [IU] via SUBCUTANEOUS

## 2023-03-07 MED ORDER — MAGNESIUM SULFATE 2 GM/50ML IV SOLN
2.0000 g | Freq: Once | INTRAVENOUS | Status: AC
  Administered 2023-03-07: 2 g via INTRAVENOUS
  Filled 2023-03-07: qty 50

## 2023-03-07 MED ORDER — ENOXAPARIN SODIUM 30 MG/0.3ML IJ SOSY: 30.0000 mg | PREFILLED_SYRINGE | INTRAMUSCULAR | Status: DC

## 2023-03-07 MED ORDER — OXYCODONE HCL 5 MG PO TABS
5.0000 mg | ORAL_TABLET | ORAL | Status: DC | PRN
  Filled 2023-03-07 (×2): qty 1

## 2023-03-07 MED ORDER — METOPROLOL TARTRATE 50 MG PO TABS
50.0000 mg | ORAL_TABLET | Freq: Two times a day (BID) | ORAL | Status: DC
  Administered 2023-03-07 – 2023-03-12 (×10): 50 mg via ORAL
  Filled 2023-03-07 (×7): qty 1
  Filled 2023-03-07: qty 2
  Filled 2023-03-07 (×2): qty 1

## 2023-03-07 MED ORDER — BISACODYL 5 MG PO TBEC: 5.0000 mg | DELAYED_RELEASE_TABLET | Freq: Every day | ORAL | Status: DC | PRN

## 2023-03-07 MED ORDER — ACETAMINOPHEN 325 MG PO TABS
650.0000 mg | ORAL_TABLET | Freq: Four times a day (QID) | ORAL | Status: DC | PRN
  Administered 2023-03-07 – 2023-03-11 (×2): 650 mg via ORAL
  Filled 2023-03-07 (×3): qty 2

## 2023-03-07 MED ORDER — POTASSIUM CHLORIDE 10 MEQ/100ML IV SOLN
10.0000 meq | INTRAVENOUS | Status: AC
  Administered 2023-03-07 (×2): 10 meq via INTRAVENOUS
  Filled 2023-03-07 (×2): qty 100

## 2023-03-07 NOTE — ED Notes (Signed)
Spoke with MRI and patient is next.

## 2023-03-07 NOTE — H&P (Signed)
Date: 03/07/2023               Patient Name:  Samantha [name] MRN: [id number]  DOB: 1946-03-07 Age / Sex: 77 y.o., female   PCP: Eloisa Northern, MD         Medical Service: Internal Medicine Teaching Service         Attending Physician: Dr. Ginnie Smart, MD    First Contact: Dr. Denton Brick, MD Pager: [phone]   Second Contact: Dr. Gwenevere Abbot, MD Pager: [phone]       After Hours (After 5p/  First Contact Pager: [phone]  weekends / holidays): Second Contact Pager: [phone]   Chief Complaint: Right wrist pain   History of Present Illness:  Samantha [name] is a 77 y.o. female with a history of CKD, DM, HTN, HLD, hyperthyroidism s/p iodine ablation June 2024, and paroxysmal atrial fibrillation on eliquis, and SVT tachycardia who presents to the ED for right wrist pain and swelling. Patient's daughter, Samantha [name], is at bedside.   Per patient:  - Pain and swelling on right hand started 2 days ago and has been worsening. Difficult to remove rings on right hand.  - Experienced something similar on left hand weeks ago but did nothing and swelling went down on its own.  - Does not recall any injury or bug bite to the hand.  - Patient denies any history of gout or pseudogout.  - Endorses warmth on hand but no discharge, overlying skin changes, or changes in sensation.  - Swelling and pain are limiting movement in right hand and arm, with patient unable to make a fist.  - Patient denies fevers, chills, sick contacts, CP, SOB, or acute pain or swelling elsewhere in her body.  - Endorses some fatigue and feeling like her heart is racing.  - Endorses one episode of vomiting in the ED, but believes this was related to taking her top denture out feeling nauseated after.   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 Underwent iodine ablation June 2024 and has been following with PCP since to  get thyroid levels under control. Given prescription for methimazole about a month ago when TSH 0.028, free T4 17.5 and free T3 of 7.7, Patent did not start it, seemed unaware that she needed to take it. Plan was to follow up with endocrinology, as not yet.   - Patient also recently hospitalized from 8/13-8/22 for sepsis secondary to UTI with Klebsiella pneumonia. Patient was discharged to SNF for PT/OT. SNF was [facility] in [place], where patient stayed for 2 weeks before being discharged home. Per patient and daughter Samantha [name], patient may knot have been taking all indicated medications consistently after discharge.   Code status: Full (confirmed with patient) Surrogate decision maker: daughter Samantha [name]  ED course:  Patient arrived to ED afebrile, tachycardic (HR 114), with an elevated blood pressure of 192/111, with normal respiratory effort on room air. Pain rated 6 out of 10. CBC showed a Hgb 7.5, platelets 657, K 2.5, TSH < 0.010, free T4 > 5.5, CRP 20.1, Mg 1.4. DG right wrist showing no acute fracture, and moderate degenerative changes. EKG sinus tachycardia HR 114. Patient given LR 500 mL bolus, PO potassium tablet 40 mEq, potassium chloride 10 mEq x2, and 1 dose of metoprolol 50 mg. [facility] PA was consulted in the ED who recommended MRI wrist to further assess for  effusions.   Meds:  Per patient she has only been taking the following consistently:  - Metformin  - Metoprolol 25 mg daily   Current Meds  Medication Sig   acetaminophen (TYLENOL) 500 MG tablet Take 500-1,000 mg by mouth every 6 (six) hours as needed for mild pain.   apixaban (ELIQUIS) 5 MG TABS tablet Take 1 tablet (5 mg total) by mouth 2 (two) times daily.   insulin aspart (NOVOLOG) 100 UNIT/ML injection 0-15 Units, Subcutaneous, 3 times daily with meals, CBG < 70: Implement Hypoglycemia measures CBG 70 - 120: 0 units CBG 121 - 150: 2 units CBG 151 - 200: 3 units CBG 201 - 250: 5 units CBG 251 - 300: 8 units CBG 301  - 350: 11 units CBG 351 - 400: 15 units CBG > 400: call MD a   loperamide HCl (IMODIUM) 1 MG/7.5ML suspension Take 15 mLs (2 mg total) by mouth as needed for diarrhea or loose stools.   melatonin 3 MG TABS tablet Take 1 tablet (3 mg total) by mouth at bedtime as needed (insomnia).   metFORMIN (GLUCOPHAGE-XR) 500 MG 24 hr tablet Take 500 mg by mouth daily.   metoprolol tartrate (LOPRESSOR) 50 MG tablet Take 1 tablet (50 mg total) by mouth 2 (two) times daily.   ondansetron (ZOFRAN) 4 MG tablet Take 1 tablet (4 mg total) by mouth every 8 (eight) hours as needed for nausea or vomiting.   Allergies: Allergies as of 03/07/2023 - Review Complete 03/07/2023  Allergen Reaction Noted   Jardiance [empagliflozin] Other (See Comments) 01/31/2023   Past Medical History:  Diagnosis Date   Allergic rhinitis    Arthritis of both knees    Arthritis of both shoulder regions    Bilateral carpal tunnel syndrome    Cataract    Chronic low back pain    CKD (chronic kidney disease), stage III (HCC)    Decreased vision    Diabetes mellitus    HTN (hypertension)    Hyperlipidemia    Morbid obesity (HCC)    Thyroid disease    Family History:  Pertinent family history includes:  - Mother with hypothyroidism, grandmother with goiter - No known gout in family  - Extensive family history of HTN and diabetes  Social History:  Lives: lives alone but grandson stays with her at night and Samantha [name] (daughter) goes by in the mornings; able to do ADLs independently, does need help from [place] for ADLs such as bills and medication management  PCP: Dr. Eloisa Northern  Alcohol: denies Tobacco use: denies Illicit drug use: denies  Review of Systems: A complete ROS was negative except as per HPI.   Physical Exam: Blood pressure (!) 179/102, pulse (!) 122, temperature 98.2 F (36.8 C), temperature source Oral, resp. rate 19, height 5\' 1"  (1.549 m), weight 70.3 kg, SpO2 99%. General: patient resting in bed in no acute  distress; daughter Samantha [name] at bedside CV: RRR, no r/m/g, intact upper extremity pulses bilaterally Pulmonary: normal respiratory effort on room air, clear lungs bilaterally Abdominal: soft, non-tender, non-extended MSK: right hand swollen compared to right, no signs of erythema, no pitting edema, difficulty moving rings around on fingers due to swelling, no abrasions or wounds on right hand, R radial pulse and L radial pulse intact Neuro: alert and oriented to self, DOB, place, situation, and date, no acute deficits observed Psych: normal mood and affect   EKG: personally reviewed my interpretation is sinus tachycardia (114 BPM), prolonged Qtc 487 ms.  DG right wrist:  IMPRESSION: 1. No acute fracture or joint malalignment. 2. Moderate triscaphe and radiocarpal degenerative change  MR right wrist:  IMPRESSION: 1. Large radiocarpal, midcarpal and distal radioulnar joint effusions with diffuse synovial enhancement following contrast consistent with synovitis. 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 Joint aspiration recommended. 2. No evidence of osteomyelitis or periarticular abscess. Probable cellulitis. 3. Multifocal arthropathy throughout the wrist, most advanced adjacent to the 1st carpometacarpal and scaphotrapeziotrapezoidal joints. 4. Nonspecific edema and enhancement within the visualized hand musculature, especially within the thenar eminence.  Assessment & Plan by Problem: Principal Problem:   Septic joint of right wrist [facility])  Patient is a 77 y.o. female with a history of CKD, DM, HTN, HLD, hyperthyroidism s/p iodine ablation June 2024, and paroxysmal atrial fibrillation on eliquis, and SVT tachycardia who presented  for right wrist pain and swelling and was admitted for septic joint of the right wrist.   Patient with 2-day history of worsening right wrist pain. Denies swelling on any other joints, only  endorses some baseline lower leg swelling. Has been taking Tylenol for pain with some relief. Patient denies fevers and does not appear to be septic. Patient afebrile with WBC 8.7. ESR elevated at 95 and CRP elevated at 20.2. Findings on x-ray consistent with degenerative changes,  while MRI results consistent with inflammatory arthritis, including septic arthritis, with a recommendation for joint aspiration.   Etiology unclear for swelling, although less likely to be gout, fracture, or compartment syndrome and more likely to be inflammatory arthritis like septic arthritis. Orthopedics following, appreciate their recommendations. - Follow up with Ortho in AM for possible arthrocentesis.  - Tylenol PRN for mild pain, oxy 5 PRN for moderate to severe pain   #Hyperthyroidism Patient with history of RAI radioactive iodine ablation June 2024. Family history of thyroid disease. TSH on admission < 0.010, free T4 > 5.50. Patient discharged from recent hospitalization in early August for urosepsis on methimazole, but patient reports she never started taking it. Patient has been persistently tachycardic throughout admission, HR 105-122.  - Started methimazole 10 mg TID  #Hypokalemia  Patient's potassium 2.5 on admission. Given IV K 10 mEq x2 and PO K mEq 40. EKG sinus tachycardia, no other changes. Will monitor daily labs and replete as needed.  - Ordered additional PO mEq 40 for later this evening - Check AM BMP, replete K as needed  - Follow up Magnesium lab, replete as needed  #Anemia  Patient with Hgb 7.6 on admission, 8.8 on 8/19 and 9.7 on 8/17. MCV 88.9, HCT 25.6. Denies feeling weakness, endorses some fatigue. No overt bleeding on exam. Will get iron panel labs (iron, TIBC, ferritin) and reticulocyte counts. - Monitor, transfuse if Hgb < 7    #T2DM Patient with recent Hgb A1C of 9.8 on 01/23/23. Patient taking metformin 500 mg daily at home. Holding home metformin. Will get A1C.  - Started  sensitive SSI - Monitor CBGs  #Tachycardia, hx SVT tachycardia Patient persistently tachycardic since admission. Has been taking metoprolol 25 mg daily.  - Started on metoprolol 50 BID. Will monitor.   #Afib Sinus tachycardia or sinus rhythm since admission. CV exam RRR.  - Telemetry - Continue eliquis 5 mg BID  Diet: carb modified DVT prophylaxis: lovenox 30 mg  Code status: Full Code  Dispo: Admit patient to Inpatient with expected length of stay greater than 2 midnights.  Signed: Philomena Doheny, MD 03/07/2023, 6:27 PM  Pager: @[phone] @ After 5pm on weekdays and 1pm on weekends: On Call pager: [phone]

## 2023-03-07 NOTE — H&P (Incomplete)
Internal Medicine Teaching Service Attending Note Date: 03/07/2023  Patient name: Samantha [name]  Medical record number: [id number]  Date of birth: November 13, 1945   I have seen and evaluated Samantha [name] and discussed their care with the Residency Team.   77 yo F with DM2 (x 40 years), newly dx hyperthyroid, radioactive Iodine treatment 11-2022, admission 01-2023 for urosepsis. She was then d/c to rehab and has been home for the last 2 weeks.  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  Over the last 2 days she has developed R wrist pain and swelling, increased heat. She has not had fever. She denies trauma/injury to her hand.   ROS- BLE, tachycardia at home, 40# wt loss prior to thyroid treatment.   Physical Exam: Blood pressure (!) 180/109, pulse (!) 118, temperature 99.8 F (37.7 C), resp. rate 19, height 5\' 1"  (1.549 m), weight 70.3 kg, SpO2 100%. General appearance: alert, cooperative, and no distress Eyes: negative findings: conjunctivae and sclerae normal, pupils equal, round, reactive to light and accomodation, and no proptosis Throat: normal findings: buccal mucosa normal and oropharynx pink & moist without lesions or evidence of thrush Neck: no adenopathy and supple, symmetrical, trachea midline Resp: clear to auscultation bilaterally Cardio: regularly irregular rhythm GI: normal findings: bowel sounds normal and soft, non-tender Extremities: edema trace BLE, 3+ BLE pulses and R wrist and hand swollen, 3+ radial pulse, mild tenderness to palpation, increased heat (moderate).  Lab results: Results for orders placed or performed during the hospital encounter of 03/07/23 (from the past 24 hour(s))  CBC with Differential     Status: Abnormal   Collection Time: 03/07/23 11:03 AM  Result Value Ref Range   WBC 8.7 4.0 - 10.5 K/uL   RBC 2.88 (L) 3.87 - 5.11 MIL/uL   Hemoglobin 7.6 (L) 12.0 - 15.0 g/dL   HCT 78.2 (L) 95.6 - 21.3  %   MCV 88.9 80.0 - 100.0 fL   MCH 26.4 26.0 - 34.0 pg   MCHC 29.7 (L) 30.0 - 36.0 g/dL   RDW 08.6 (H) 57.8 - 46.9 %   Platelets 657 (H) 150 - 400 K/uL   nRBC 0.0 0.0 - 0.2 %   Neutrophils Relative % 74 %   Neutro Abs 6.5 1.7 - 7.7 K/uL   Lymphocytes Relative 16 %   Lymphs Abs 1.4 0.7 - 4.0 K/uL   Monocytes Relative 9 %   Monocytes Absolute 0.8 0.1 - 1.0 K/uL   Eosinophils Relative 0 %   Eosinophils Absolute 0.0 0.0 - 0.5 K/uL   Basophils Relative 0 %   Basophils Absolute 0.0 0.0 - 0.1 K/uL   Immature Granulocytes 1 %   Abs Immature Granulocytes 0.04 0.00 - 0.07 K/uL  Comprehensive metabolic panel     Status: Abnormal   Collection Time: 03/07/23 11:03 AM  Result Value Ref Range   Sodium 140 135 - 145 mmol/L   Potassium 2.5 (LL) 3.5 - 5.1 mmol/L   Chloride 103 98 - 111 mmol/L   CO2 23 22 - 32 mmol/L   Glucose, Bld 166 (H) 70 - 99 mg/dL   BUN <5 (L) 8 - 23 mg/dL   Creatinine, Ser 6.29 0.44 - 1.00 mg/dL   Calcium 8.8 (L) 8.9 - 10.3 mg/dL   Total Protein 6.8 6.5 - 8.1 g/dL   Albumin 2.4 (L) 3.5 - 5.0 g/dL   AST 12 (L) 15 - 41 U/L  ALT 9 0 - 44 U/L   Alkaline Phosphatase 76 38 - 126 U/L   Total Bilirubin 1.2 0.3 - 1.2 mg/dL   GFR, Estimated >27 >25 mL/min   Anion gap 14 5 - 15  TSH     Status: Abnormal   Collection Time: 03/07/23 11:03 AM  Result Value Ref Range   TSH <0.010 (L) 0.350 - 4.500 uIU/mL  T4, free     Status: Abnormal   Collection Time: 03/07/23 11:03 AM  Result Value Ref Range   Free T4 >5.50 (H) 0.61 - 1.12 ng/dL  C-reactive protein     Status: Abnormal   Collection Time: 03/07/23 11:03 AM  Result Value Ref Range   CRP 20.2 (H) <1.0 mg/dL  Sedimentation rate     Status: Abnormal   Collection Time: 03/07/23  1:05 PM  Result Value Ref Range   Sed Rate 95 (H) 0 - 22 mm/hr    Imaging results:  DG Wrist Complete Right  Result Date: 03/07/2023 CLINICAL DATA:  Wrist pain and swelling EXAM: RIGHT WRIST - COMPLETE 3+ VIEW COMPARISON:  None Available.  FINDINGS: No evidence of acute fracture. No joint malalignment. 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 IMPRESSION: 1. No acute fracture or joint malalignment. 2. Moderate triscaphe and radiocarpal degenerative change with Electronically Signed   By: Feliberto Harts M.D.   On: 03/07/2023 12:26    Assessment and Plan: I agree with the formulated Assessment and Plan with the following changes:  R wrist pain DM2 Hyperthyroidism hypokalemia  Not clear cause of her arthritis- We have asked ortho to eval MRI pending Consider arthrocentesis Diabetes rx, SSI Check A1C.  Will work on getting her rings removed.  Discuss with endo re: next steps.  Supplement potassium.

## 2023-03-07 NOTE — ED Notes (Signed)
ED TO INPATIENT HANDOFF REPORT  ED Nurse Name and Phone #: Cat/gabe  S Name/Age/Gender Samantha [name] 77 y.o. female Room/Bed: H015C/H015C  Code Status   Code Status: Prior  Home/SNF/Other Home Patient oriented to: self, place, time, and situation Is this baseline? Yes   Triage Complete: Triage complete  Chief Complaint Rt hand swelling  Triage Note Pt c.o right wrist pain since yesterday. Swelling noted to her hand and wrist. Denies recent injury. No fever/chills. Wrist is TTP/warm to touch.    Allergies Allergies  Allergen Reactions   Jardiance [Empagliflozin] Other (See Comments)    Euglycemic DKA    Level of Care/Admitting Diagnosis ED Disposition     ED Disposition  Admit   Condition  --   Comment  The patient appears reasonably stabilized for admission considering the current resources, flow, and capabilities available in the ED at this time, and I doubt any other [facility] requiring further screening and/or treatment in the ED prior to admission is  present.          B Medical/Surgery History Past Medical History:  Diagnosis Date   Allergic rhinitis    Arthritis of both knees    Arthritis of both shoulder regions    Bilateral carpal tunnel syndrome    Cataract    Chronic low back pain    CKD (chronic kidney disease), stage III (HCC)    Decreased vision    Diabetes mellitus    HTN (hypertension)    Hyperlipidemia    Morbid obesity (HCC)    Thyroid disease    Past Surgical History:  Procedure Laterality Date   CATARACT EXTRACTION     COLONOSCOPY     COLONOSCOPY N/A 09/15/2020   Procedure: COLONOSCOPY;  Surgeon: Corbin Ade, MD;  [facility]: [facility];  Service: Endoscopy;  Laterality: N/A;  ASA II / PM procedure   POLYPECTOMY  09/15/2020   Procedure: POLYPECTOMY;  Surgeon: Corbin Ade, MD;  Location: [facility];  Service: Endoscopy;;   TUBAL LIGATION       A IV Location/Drains/Wounds Patient Lines/Drains/Airways Status     Active  Line/Drains/Airways     Name Placement date Placement time Site Days   Peripheral IV 03/07/23 20 G Left;Posterior Hand 03/07/23  1133  Hand  less than 1            Intake/Output Last 24 hours No intake or output data in the 24 hours ending 03/07/23 1610  Labs/Imaging Results for orders placed or performed during the hospital encounter of 03/07/23 (from the past 48 hour(s))  CBC with Differential     Status: Abnormal   Collection Time: 03/07/23 11:03 AM  Result Value Ref Range   WBC 8.7 4.0 - 10.5 K/uL   RBC 2.88 (L) 3.87 - 5.11 MIL/uL   Hemoglobin 7.6 (L) 12.0 - 15.0 g/dL   HCT 16.1 (L) 09.6 - 04.5 %   MCV 88.9 80.0 - 100.0 fL   MCH 26.4 26.0 - 34.0 pg   MCHC 29.7 (L) 30.0 - 36.0 g/dL   RDW 40.9 (H) 81.1 - 91.4 %   Platelets 657 (H) 150 - 400 K/uL   nRBC 0.0 0.0 - 0.2 %   Neutrophils Relative % 74 %   Neutro Abs 6.5 1.7 - 7.7 K/uL   Lymphocytes Relative 16 %   Lymphs Abs 1.4 0.7 - 4.0 K/uL   Monocytes Relative 9 %   Monocytes Absolute 0.8 0.1 - 1.0 K/uL   Eosinophils Relative  0 %   Eosinophils Absolute 0.0 0.0 - 0.5 K/uL   Basophils Relative 0 %   Basophils Absolute 0.0 0.0 - 0.1 K/uL   Immature Granulocytes 1 %   Abs Immature Granulocytes 0.04 0.00 - 0.07 K/uL    Comment: Performed at [facility], [address] [place], [place] [address]  Comprehensive metabolic panel     Status: Abnormal   Collection Time: 03/07/23 11:03 AM  Result Value Ref Range   Sodium 140 135 - 145 mmol/L   Potassium 2.5 (LL) 3.5 - 5.1 mmol/L    Comment: CRITICAL RESULT CALLED TO, READ BACK BY AND VERIFIED WITH C.Cornelius Marullo RN @1229  09.25.2024 E.AHMED   Chloride 103 98 - 111 mmol/L   CO2 23 22 - 32 mmol/L   Glucose, Bld 166 (H) 70 - 99 mg/dL    Comment: Glucose reference range applies only to samples taken after fasting for at least 8 hours.   BUN <5 (L) 8 - 23 mg/dL   Creatinine, Ser 6.29 0.44 - 1.00 mg/dL   Calcium 8.8 (L) 8.9 - 10.3 mg/dL   Total Protein 6.8 6.5 - 8.1 g/dL    Albumin 2.4 (L) 3.5 - 5.0 g/dL   AST 12 (L) 15 - 41 U/L   ALT 9 0 - 44 U/L   Alkaline Phosphatase 76 38 - 126 U/L   Total Bilirubin 1.2 0.3 - 1.2 mg/dL   GFR, Estimated >52 >84 mL/min    Comment: (NOTE) Calculated using the CKD-EPI Creatinine Equation (2021)    Anion gap 14 5 - 15    Comment: Performed at [facility], [address], [place], [place] [address]  TSH     Status: Abnormal   Collection Time: 03/07/23 11:03 AM  Result Value Ref Range   TSH <0.010 (L) 0.350 - 4.500 uIU/mL    Comment: Performed by a 3rd Generation assay with a functional sensitivity of <=0.01 uIU/mL. Performed at [facility], [address], [place], [place] [address]   T4, free     Status: Abnormal   Collection Time: 03/07/23 11:03 AM  Result Value Ref Range   Free T4 >5.50 (H) 0.61 - 1.12 ng/dL    Comment: (NOTE) Biotin ingestion may interfere with free T4 tests. If the results are inconsistent with the TSH level, previous test results, or the clinical presentation, then consider biotin interference. If needed, order repeat testing after stopping biotin. Performed at [facility], [address], [place], [place] [address]   C-reactive protein     Status: Abnormal   Collection Time: 03/07/23 11:03 AM  Result Value Ref Range   CRP 20.2 (H) <1.0 mg/dL    Comment: Performed at [facility], [address] [place], [place] [address]  Sedimentation rate     Status: Abnormal   Collection Time: 03/07/23  1:05 PM  Result Value Ref Range   Sed Rate 95 (H) 0 - 22 mm/hr    Comment: Performed at [facility], [address] [place], [place] [address]   DG Wrist Complete Right  Result Date: 03/07/2023 CLINICAL DATA:  Wrist pain and swelling EXAM: RIGHT WRIST - COMPLETE 3+ VIEW COMPARISON:  None Available. FINDINGS: No evidence of acute fracture. No joint malalignment. 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 IMPRESSION: 1. No acute  fracture or joint malalignment. 2. Moderate triscaphe and radiocarpal degenerative change with Electronically Signed   By: Juluis Mire.D.  On: 03/07/2023 12:26    Pending Labs Unresulted Labs (From admission, onward)     Start     Ordered   03/07/23 1028  Blood culture (routine x 2)  BLOOD CULTURE X 2,   R (with STAT occurrences)      03/07/23 1028            Vitals/Pain Today's Vitals   03/07/23 0958 03/07/23 1204 03/07/23 1419 03/07/23 1600  BP:  (!) 188/103 (!) 198/95 (!) 180/109  Pulse:  (!) 113 (!) 115 (!) 118  Resp:  15 (!) 23 19  Temp:  99.8 F (37.7 C)    TempSrc:      SpO2:  93% 100% 100%  Weight: 155 lb (70.3 kg)     Height: 5\' 1"  (1.549 m)     PainSc: 6        Isolation Precautions No active isolations  Medications Medications  potassium chloride 10 mEq in 100 mL IVPB (0 mEq Intravenous Stopped 03/07/23 1546)  lactated ringers bolus 500 mL (0 mLs Intravenous Stopped 03/07/23 1242)  potassium chloride SA (KLOR-CON M) CR tablet 40 mEq (40 mEq Oral Given 03/07/23 1410)    Mobility manual wheelchair     Focused Assessments     R Recommendations: See Admitting Provider Note  Report given to:   Additional Notes:

## 2023-03-07 NOTE — ED Triage Notes (Signed)
Pt c.o right wrist pain since yesterday. Swelling noted to her hand and wrist. Denies recent injury. No fever/chills. Wrist is TTP/warm to touch.

## 2023-03-07 NOTE — ED Provider Notes (Signed)
[place] EMERGENCY DEPARTMENT AT [facility] Provider Note   CSN: [id number] Arrival date & time: 03/07/23  7829     History  Chief Complaint  Patient presents with   Wrist Pain    Samantha [name] is a 77 y.o. female with PMHx CKD, DM, HTN, HLD, thyroid disease who presents to ED concerned with right wrist pain and swelling x2 days. Denies hx gout and bug bite. States that she was discharged last month for Urosepsis.   Patient meets with Endocrinologist 03/12/2023.  Denies fever, chest pain, dyspnea, cough, nausea, vomiting.    Wrist Pain       Home Medications Prior to Admission medications   Medication Sig Start Date End Date Taking? Authorizing Provider  acetaminophen (TYLENOL) 500 MG tablet Take 500-1,000 mg by mouth every 6 (six) hours as needed for mild pain.    [provider]  apixaban (ELIQUIS) 5 MG TABS tablet Take 1 tablet (5 mg total) by mouth 2 (two) times daily. 02/01/23   Ghimire, Werner Lean, MD  feeding supplement (ENSURE ENLIVE / ENSURE PLUS) LIQD Take 237 mLs by mouth 2 (two) times daily between meals. 01/31/23   Ghimire, Werner Lean, MD  insulin aspart (NOVOLOG) 100 UNIT/ML injection 0-15 Units, Subcutaneous, 3 times daily with meals, CBG < 70: Implement Hypoglycemia measures CBG 70 - 120: 0 units CBG 121 - 150: 2 units CBG 151 - 200: 3 units CBG 201 - 250: 5 units CBG 251 - 300: 8 units CBG 301 - 350: 11 units CBG 351 - 400: 15 units CBG > 400: call MD a 01/31/23   Ghimire, Werner Lean, MD  insulin glargine-yfgn (SEMGLEE) 100 UNIT/ML injection Inject 0.12 mLs (12 Units total) into the skin daily. 01/31/23   Ghimire, Werner Lean, MD  loperamide HCl (IMODIUM) 1 MG/7.5ML suspension Take 15 mLs (2 mg total) by mouth as needed for diarrhea or loose stools. 01/31/23   Ghimire, Werner Lean, MD  melatonin 3 MG TABS tablet Take 1 tablet (3 mg total) by mouth at bedtime as needed (insomnia). 01/31/23   Ghimire, Werner Lean, MD  metFORMIN (GLUCOPHAGE-XR) 500 MG 24 hr  tablet Take 500 mg by mouth daily. 07/18/22   [provider]  methimazole (TAPAZOLE) 10 MG tablet Take 1 tablet (10 mg total) by mouth 2 (two) times daily. 01/31/23   Ghimire, Werner Lean, MD  metoprolol tartrate (LOPRESSOR) 50 MG tablet Take 1 tablet (50 mg total) by mouth 2 (two) times daily. 01/31/23   Ghimire, Werner Lean, MD  ondansetron (ZOFRAN) 4 MG tablet Take 1 tablet (4 mg total) by mouth every 8 (eight) hours as needed for nausea or vomiting. 01/19/23   Dyanne Iha, MD  OVER THE COUNTER MEDICATION Take 1 tablet by mouth at bedtime as needed (leg cramps). Medication: legatrin pm    [provider]      Allergies    Jardiance [empagliflozin]    Review of Systems   Review of Systems  Musculoskeletal:        Wrist pain    Physical Exam Updated Vital Signs BP (!) 198/95 (BP Location: Right Arm)   Pulse (!) 115   Temp 99.8 F (37.7 C)   Resp (!) 23   Ht 5\' 1"  (1.549 m)   Wt 70.3 kg   SpO2 100%   BMI 29.29 kg/m  Physical Exam Vitals and nursing note reviewed.  Constitutional:      General: She is not in acute distress.  Appearance: She is not ill-appearing or toxic-appearing.  HENT:     Head: Normocephalic and atraumatic.     Mouth/Throat:     Mouth: Mucous membranes are moist.  Eyes:     General: No scleral icterus.       Right eye: No discharge.        Left eye: No discharge.     Conjunctiva/sclera: Conjunctivae normal.  Cardiovascular:     Rate and Rhythm: Regular rhythm. Tachycardia present.     Pulses: Normal pulses.     Heart sounds: Normal heart sounds. No murmur heard. Pulmonary:     Effort: Pulmonary effort is normal. No respiratory distress.     Breath sounds: Normal breath sounds. No wheezing, rhonchi or rales.  Abdominal:     General: Abdomen is flat. Bowel sounds are normal. There is no distension.     Palpations: There is no mass.     Tenderness: There is no abdominal tenderness.  Musculoskeletal:     Right lower leg: No edema.      Left lower leg: No edema.     Comments: Right wrist is tender to palpation and with mild increased warmth when compared to left wrist. Also with moderate swelling. +2 radial pulse. Sensation to light touch intact. Active ROM restricted d/t pain.  Skin:    General: Skin is warm and dry.     Findings: No rash.  Neurological:     General: No focal deficit present.     Mental Status: She is alert and oriented to person, place, and time. Mental status is at baseline.  Psychiatric:        Mood and Affect: Mood normal.        Behavior: Behavior normal.     ED Results / Procedures / Treatments   Labs (all labs ordered are listed, but only abnormal results are displayed) Labs Reviewed  CBC WITH DIFFERENTIAL/PLATELET - Abnormal; Notable for the following components:      Result Value   RBC 2.88 (*)    Hemoglobin 7.6 (*)    HCT 25.6 (*)    MCHC 29.7 (*)    RDW 20.9 (*)    Platelets 657 (*)    All other components within normal limits  COMPREHENSIVE METABOLIC PANEL - Abnormal; Notable for the following components:   Potassium 2.5 (*)    Glucose, Bld 166 (*)    BUN <5 (*)    Calcium 8.8 (*)    Albumin 2.4 (*)    AST 12 (*)    All other components within normal limits  TSH - Abnormal; Notable for the following components:   TSH <0.010 (*)    All other components within normal limits  T4, FREE - Abnormal; Notable for the following components:   Free T4 >5.50 (*)    All other components within normal limits  C-REACTIVE PROTEIN - Abnormal; Notable for the following components:   CRP 20.2 (*)    All other components within normal limits  SEDIMENTATION RATE - Abnormal; Notable for the following components:   Sed Rate 95 (*)    All other components within normal limits  CULTURE, BLOOD (ROUTINE X 2)  CULTURE, BLOOD (ROUTINE X 2)    EKG None  Radiology DG Wrist Complete Right  Result Date: 03/07/2023 CLINICAL DATA:  Wrist pain and swelling EXAM: RIGHT WRIST - COMPLETE 3+ VIEW  COMPARISON:  None Available. FINDINGS: No evidence of acute fracture. No joint malalignment. Moderate triscaphe and radiocarpal degenerative change  with chondrocalcinosis. IMPRESSION: 1. No acute fracture or joint malalignment. 2. Moderate triscaphe and radiocarpal degenerative change with Electronically Signed   By: Feliberto Harts M.D.   On: 03/07/2023 12:26    Procedures .Critical Care  Performed by: Dorthy Cooler, PA-C Authorized by: Dorthy Cooler, PA-C   Critical care provider statement:    Critical care time (minutes):  30   Critical care was necessary to treat or prevent imminent or life-threatening deterioration of the following conditions: hypokalemia.   Critical care was time spent personally by me on the following activities:  Development of treatment plan with patient or surrogate, discussions with consultants, evaluation of patient's response to treatment, examination of patient, ordering and review of laboratory studies, ordering and review of radiographic studies, ordering and performing treatments and interventions, pulse oximetry, re-evaluation of patient's condition and review of old charts   I assumed direction of critical care for this patient from another provider in my specialty: yes     Care discussed with: admitting provider       Medications Ordered in ED Medications  potassium chloride 10 mEq in 100 mL IVPB (10 mEq Intravenous New Bag/Given 03/07/23 1410)  lactated ringers bolus 500 mL (0 mLs Intravenous Stopped 03/07/23 1242)  potassium chloride SA (KLOR-CON M) CR tablet 40 mEq (40 mEq Oral Given 03/07/23 1410)    ED Course/ Medical Decision Making/ A&P                                 Medical Decision Making Amount and/or Complexity of Data Reviewed Labs: ordered. Radiology: ordered.  Risk Prescription drug management.   This patient presents to the ED for concern of wrist pain, this involves an extensive number of treatment options, and is  a complaint that carries with it a high risk of complications and morbidity.  The differential diagnosis includes hemarthrosis, gout, septic joint, fracture, tendonitis, carpal tunnel syndrome, muscle strain, bursitis, compartment syndrome   Co morbidities that complicate the patient evaluation  CKD, DM, HTN, HLD, thyroid disease    Additional history obtained:  Additional history obtained from Discharge Summary 01/31/2023 Acute metabolic encephalopathy likely d/t hypernatremia/AKI/possible euglycemic DKA UTI complicated with Klebsiella pneumoniae Patient has been on methimazole since discharge 01/31/2023 and endorses compliance - along with compliance of Metoprolol   Lab Tests:  I Ordered, and personally interpreted labs.  The pertinent results include:   - TSH <0.010 - T4 free: >5.50 - CMP: hypokalemia at 2.5; hyperglycemia at 166 - CBC: no leukocytosis; anemia with hgb at 7.6 - blood cultures: pending - ESR: elevated at 95 - CRP: elevated at 20.2   Imaging Studies ordered:  I ordered imaging studies including wrist Xray I independently visualized and interpreted imaging Shared findings with patient I agree with the radiologist interpretation   Cardiac Monitoring: / EKG:  The patient was maintained on a cardiac monitor.  I personally viewed and interpreted the cardiac monitored which showed an underlying rhythm of: tachycardia without ST changes or arrhythmias   Problem List / ED Course / Critical interventions / Medication management  Admitting patient for tachycardia, HTN, hypokalemia and possible septic joint Patient with PMHx thyroid disease, CKD, DM, HTN, HLD, and recent hospitalization for AMS from urosepsis vs AKI and was discharged 1 month ago Presents to ED concerned for right wrist swelling/pain x2 days. Patient unable to move wrist d/t pain. Right wrist does feel a little warmer than  left wrist. Patient also tachycardia and HTN despite being compliant with  methimazole and metoprolol 50mg . Also with low grade fever at 99.9F. CMP with hypokalemia at 2.5 - started IV and oral supplementation. T4 elevated >5.50 and TSH low at <0.01. ESR elevated at 95. CRP elevated at 20.2. Wrist xray reassuring. Consulted with Ortho PA Charma Igo who recommended MRI wrist to further assess for effusions.  Given patient's concern for septic joint and uncontrolled HTN and tachycardia, I believe it is in patient's best interest to come inpatient for further treatment. Consulted with Dr. Welton Flakes who agrees to admit patient. I have reviewed the patients home medicines and have made adjustments as needed   Ddx these are considered less likely due to history of present illness and physical exam -hemarthrosis: joint without swelling; ROM intact -gout: no warmth or erythema; ROM intact  -septic joint: afebrile; no warmth or erythema; no skin changes; ROM intact  -fracture: xray without concern  -compartment syndrome: area not tense; neurovascularly intact   Social Determinants of Health:  none          Final Clinical Impression(s) / ED Diagnoses Final diagnoses:  Hyperthyroidism  Right wrist pain    Rx / DC Orders ED Discharge Orders     None         Dorthy Cooler, [place] 03/07/23 1523    Lonell Grandchild, MD 03/08/23 1554

## 2023-03-07 NOTE — ED Notes (Signed)
Pt IV burning with IV potassium. Per Valrie Hart F, PA-C titrate up LR to rate that lowers patients burning sensation.

## 2023-03-07 NOTE — ED Notes (Signed)
ED TO INPATIENT HANDOFF REPORT  ED Nurse Name and Phone #: Gustavo Lah, rn [phone]  S Name/Age/Gender Samantha [name] 77 y.o. female Room/Bed: 038C/038C  Code Status   Code Status: Full Code  Home/SNF/Other Home Patient oriented to: self, place, time, and situation Is this baseline? Yes   Triage Complete: Triage complete  Chief Complaint Septic joint of right wrist (HCC) [M00.9]  Triage Note Pt c.o right wrist pain since yesterday. Swelling noted to her hand and wrist. Denies recent injury. No fever/chills. Wrist is TTP/warm to touch.    Allergies Allergies  Allergen Reactions   Jardiance [Empagliflozin] Other (See Comments)    Euglycemic DKA    Level of Care/Admitting Diagnosis ED Disposition     ED Disposition  Admit   Condition  --   Comment  Hospital Area: [facility] [[id number]]  Level of Care: Telemetry Medical [104]  May admit patient to [facility] or [facility] if equivalent level of care is available:: Yes  Covid Evaluation: Asymptomatic - no recent exposure (last 10 days) testing not required  Diagnosis: Septic joint of right wrist [facility]) [[id number]]  Admitting Physician: Ginnie Smart [[id number]]  Attending Physician: Ninetta Lights, JEFFREY C [[id number]]  Certification:: I certify this patient will need inpatient services for at least 2 midnights  Expected Medical Readiness: 03/09/2023          B Medical/Surgery History Past Medical History:  Diagnosis Date   Allergic rhinitis    Arthritis of both knees    Arthritis of both shoulder regions    Bilateral carpal tunnel syndrome    Cataract    Chronic low back pain    CKD (chronic kidney disease), stage III (HCC)    Decreased vision    Diabetes mellitus    HTN (hypertension)    Hyperlipidemia    Morbid obesity (HCC)    Thyroid disease    Past Surgical History:  Procedure Laterality Date   CATARACT EXTRACTION     COLONOSCOPY     COLONOSCOPY N/A 09/15/2020   Procedure: COLONOSCOPY;   Surgeon: Corbin Ade, MD;  Location: [facility];  Service: Endoscopy;  Laterality: N/A;  ASA II / PM procedure   POLYPECTOMY  09/15/2020   Procedure: POLYPECTOMY;  Surgeon: Corbin Ade, MD;  Location: [facility];  Service: Endoscopy;;   TUBAL LIGATION       A IV Location/Drains/Wounds Patient Lines/Drains/Airways Status     Active Line/Drains/Airways     Name Placement date Placement time Site Days   Peripheral IV 03/07/23 20 G Left;Posterior Hand 03/07/23  1133  Hand  less than 1            Intake/Output Last 24 hours No intake or output data in the 24 hours ending 03/07/23 1811  Labs/Imaging Results for orders placed or performed during the hospital encounter of 03/07/23 (from the past 48 hour(s))  CBC with Differential     Status: Abnormal   Collection Time: 03/07/23 11:03 AM  Result Value Ref Range   WBC 8.7 4.0 - 10.5 K/uL   RBC 2.88 (L) 3.87 - 5.11 MIL/uL   Hemoglobin 7.6 (L) 12.0 - 15.0 g/dL   HCT 57.8 (L) 46.9 - 62.9 %   MCV 88.9 80.0 - 100.0 fL   MCH 26.4 26.0 - 34.0 pg   MCHC 29.7 (L) 30.0 - 36.0 g/dL   RDW 52.8 (H) 41.3 - 24.4 %   Platelets 657 (H) 150 - 400 K/uL  nRBC 0.0 0.0 - 0.2 %   Neutrophils Relative % 74 %   Neutro Abs 6.5 1.7 - 7.7 K/uL   Lymphocytes Relative 16 %   Lymphs Abs 1.4 0.7 - 4.0 K/uL   Monocytes Relative 9 %   Monocytes Absolute 0.8 0.1 - 1.0 K/uL   Eosinophils Relative 0 %   Eosinophils Absolute 0.0 0.0 - 0.5 K/uL   Basophils Relative 0 %   Basophils Absolute 0.0 0.0 - 0.1 K/uL   Immature Granulocytes 1 %   Abs Immature Granulocytes 0.04 0.00 - 0.07 K/uL    Comment: Performed at [facility], [address], [place], [place] [address]  Comprehensive metabolic panel     Status: Abnormal   Collection Time: 03/07/23 11:03 AM  Result Value Ref Range   Sodium 140 135 - 145 mmol/L   Potassium 2.5 (LL) 3.5 - 5.1 mmol/L    Comment: CRITICAL RESULT CALLED TO, READ BACK BY AND VERIFIED WITH C.WILLIAMS RN @1229   09.25.2024 E.AHMED   Chloride 103 98 - 111 mmol/L   CO2 23 22 - 32 mmol/L   Glucose, Bld 166 (H) 70 - 99 mg/dL    Comment: Glucose reference range applies only to samples taken after fasting for at least 8 hours.   BUN <5 (L) 8 - 23 mg/dL   Creatinine, Ser 4.40 0.44 - 1.00 mg/dL   Calcium 8.8 (L) 8.9 - 10.3 mg/dL   Total Protein 6.8 6.5 - 8.1 g/dL   Albumin 2.4 (L) 3.5 - 5.0 g/dL   AST 12 (L) 15 - 41 U/L   ALT 9 0 - 44 U/L   Alkaline Phosphatase 76 38 - 126 U/L   Total Bilirubin 1.2 0.3 - 1.2 mg/dL   GFR, Estimated >34 >74 mL/min    Comment: (NOTE) Calculated using the CKD-EPI Creatinine Equation (2021)    Anion gap 14 5 - 15    Comment: Performed at [facility], [address], [place], [place] [address]  TSH     Status: Abnormal   Collection Time: 03/07/23 11:03 AM  Result Value Ref Range   TSH <0.010 (L) 0.350 - 4.500 uIU/mL    Comment: Performed by a 3rd Generation assay with a functional sensitivity of <=0.01 uIU/mL. Performed at [facility], [address], [place], [place] [address]   T4, free     Status: Abnormal   Collection Time: 03/07/23 11:03 AM  Result Value Ref Range   Free T4 >5.50 (H) 0.61 - 1.12 ng/dL    Comment: (NOTE) Biotin ingestion may interfere with free T4 tests. If the results are inconsistent with the TSH level, previous test results, or the clinical presentation, then consider biotin interference. If needed, order repeat testing after stopping biotin. Performed at [facility], [address], [place], [place] [address]   C-reactive protein     Status: Abnormal   Collection Time: 03/07/23 11:03 AM  Result Value Ref Range   CRP 20.2 (H) <1.0 mg/dL    Comment: Performed at [facility], [address] [place], [place] [address]  Sedimentation rate     Status: Abnormal   Collection Time: 03/07/23  1:05 PM  Result Value Ref Range   Sed Rate 95 (H) 0 - 22 mm/hr    Comment: Performed at [facility], [address], [place], [place] [address]   DG Wrist Complete Right  Result Date: 03/07/2023 CLINICAL DATA:  Wrist pain  and swelling EXAM: RIGHT WRIST - COMPLETE 3+ VIEW COMPARISON:  None Available. FINDINGS: No evidence of acute fracture. No joint malalignment. 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 IMPRESSION: 1. No acute fracture or joint malalignment. 2. Moderate triscaphe and radiocarpal degenerative change with Electronically Signed   By: Feliberto Harts M.D.   On: 03/07/2023 12:26    Pending Labs Unresulted Labs (From admission, onward)     Start     Ordered   03/08/23 0500  CBC  Tomorrow morning,   R        03/07/23 1718   03/08/23 0500  Basic metabolic panel  Tomorrow morning,   R        03/07/23 1718   03/07/23 1717  Magnesium  Once,   STAT        03/07/23 1716   03/07/23 1028  Blood culture (routine x 2)  BLOOD CULTURE X 2,   R (with STAT occurrences)      03/07/23 1028            Vitals/Pain Today's Vitals   03/07/23 1204 03/07/23 1419 03/07/23 1600 03/07/23 1800  BP: (!) 188/103 (!) 198/95 (!) 180/109 (!) 179/102  Pulse: (!) 113 (!) 115 (!) 118 (!) 122  Resp: 15 (!) 23 19 19   Temp: 99.8 F (37.7 C)   98.2 F (36.8 C)  TempSrc:    Oral  SpO2: 93% 100% 100% 99%  Weight:      Height:      PainSc:        Isolation Precautions No active isolations  Medications Medications  potassium chloride 10 mEq in 100 mL IVPB (10 mEq Intravenous New Bag/Given 03/07/23 1717)  enoxaparin (LOVENOX) injection 30 mg (30 mg Subcutaneous Patient Refused/Not Given 03/07/23 1738)  acetaminophen (TYLENOL) tablet 650 mg (has no administration in time range)    Or  acetaminophen (TYLENOL) suppository 650 mg (has no administration in time range)  oxyCODONE (Oxy IR/ROXICODONE) immediate release tablet 5 mg (has no administration in time range)  bisacodyl (DULCOLAX) EC tablet 5 mg (has no administration in time range)  insulin aspart (novoLOG) injection  0-9 Units ( Subcutaneous Not Given 03/07/23 1738)  potassium chloride SA (KLOR-CON M) CR tablet 40 mEq (has no administration in time range)  metoprolol tartrate (LOPRESSOR) tablet 50 mg (has no administration in time range)  lactated ringers bolus 500 mL (0 mLs Intravenous Stopped 03/07/23 1242)  potassium chloride SA (KLOR-CON M) CR tablet 40 mEq (40 mEq Oral Given 03/07/23 1410)  gadobutrol (GADAVIST) 1 MMOL/ML injection 7 mL (7 mLs Intravenous Contrast Given 03/07/23 1707)    Mobility walks with device     Focused Assessments Cardiac Assessment Handoff:    Lab Results  Component Value Date   TROPONINI <0.03 09/06/2015   No results found for: "DDIMER" Does the Patient currently have chest pain? No    R Recommendations: See Admitting Provider Note  Report given to:   Additional Notes: pain 10/10 when moving right hand

## 2023-03-08 ENCOUNTER — Inpatient Hospital Stay (HOSPITAL_COMMUNITY): Payer: Medicare HMO

## 2023-03-08 ENCOUNTER — Encounter (HOSPITAL_COMMUNITY)
Admission: EM | Disposition: A | Discharge status: Home Health | Payer: Self-pay | Source: Home / Self Care | Attending: Infectious Diseases

## 2023-03-08 ENCOUNTER — Encounter (HOSPITAL_COMMUNITY): Payer: Self-pay | Admitting: Anesthesiology

## 2023-03-08 LAB — CULTURE, BLOOD (ROUTINE X 2)

## 2023-03-08 LAB — SYNOVIAL CELL COUNT + DIFF, W/ CRYSTALS
Crystals, Fluid: NONE SEEN
Eosinophils-Synovial: 0 % (ref 0–1)
Lymphocytes-Synovial Fld: 20 % (ref 0–20)
Monocyte-Macrophage-Synovial Fluid: 11 % — ABNORMAL LOW (ref 50–90)
Neutrophil, Synovial: 68 % — ABNORMAL HIGH (ref 0–25)
WBC, Synovial: 19 /mm3 (ref 0–200)

## 2023-03-08 LAB — TECHNOLOGIST SMEAR REVIEW: Plt Morphology: INCREASED

## 2023-03-08 LAB — CBC
HCT: 26 % — ABNORMAL LOW (ref 36.0–46.0)
Hemoglobin: 7.7 g/dL — ABNORMAL LOW (ref 12.0–15.0)
MCH: 25.9 pg — ABNORMAL LOW (ref 26.0–34.0)
MCHC: 29.6 g/dL — ABNORMAL LOW (ref 30.0–36.0)
MCV: 87.5 fL (ref 80.0–100.0)
Platelets: 666 10*3/uL — ABNORMAL HIGH (ref 150–400)
RBC: 2.97 MIL/uL — ABNORMAL LOW (ref 3.87–5.11)
RDW: 20.8 % — ABNORMAL HIGH (ref 11.5–15.5)
WBC: 8.5 10*3/uL (ref 4.0–10.5)
nRBC: 0 % (ref 0.0–0.2)

## 2023-03-08 LAB — HEMOGLOBIN A1C
Hgb A1c MFr Bld: 7.1 % — ABNORMAL HIGH (ref 4.8–5.6)
Mean Plasma Glucose: 157.07 mg/dL

## 2023-03-08 LAB — CBC WITH DIFFERENTIAL/PLATELET
Abs Immature Granulocytes: 0.03 10*3/uL (ref 0.00–0.07)
Basophils Absolute: 0 10*3/uL (ref 0.0–0.1)
Basophils Relative: 0 %
Eosinophils Absolute: 0.1 10*3/uL (ref 0.0–0.5)
Eosinophils Relative: 1 %
HCT: 26.6 % — ABNORMAL LOW (ref 36.0–46.0)
Hemoglobin: 7.7 g/dL — ABNORMAL LOW (ref 12.0–15.0)
Immature Granulocytes: 0 %
Lymphocytes Relative: 33 %
Lymphs Abs: 2.8 10*3/uL (ref 0.7–4.0)
MCH: 25.2 pg — ABNORMAL LOW (ref 26.0–34.0)
MCHC: 28.9 g/dL — ABNORMAL LOW (ref 30.0–36.0)
MCV: 86.9 fL (ref 80.0–100.0)
Monocytes Absolute: 0.7 10*3/uL (ref 0.1–1.0)
Monocytes Relative: 8 %
Neutro Abs: 5 10*3/uL (ref 1.7–7.7)
Neutrophils Relative %: 58 %
Platelets: 692 10*3/uL — ABNORMAL HIGH (ref 150–400)
RBC: 3.06 MIL/uL — ABNORMAL LOW (ref 3.87–5.11)
RDW: 20.9 % — ABNORMAL HIGH (ref 11.5–15.5)
WBC: 8.6 10*3/uL (ref 4.0–10.5)
nRBC: 0.2 % (ref 0.0–0.2)

## 2023-03-08 LAB — RETICULOCYTES
Immature Retic Fract: 33.2 % — ABNORMAL HIGH (ref 2.3–15.9)
RBC.: 3 MIL/uL — ABNORMAL LOW (ref 3.87–5.11)
Retic Count, Absolute: 128.7 10*3/uL (ref 19.0–186.0)
Retic Ct Pct: 4.3 % — ABNORMAL HIGH (ref 0.4–3.1)

## 2023-03-08 LAB — BASIC METABOLIC PANEL
Anion gap: 11 (ref 5–15)
BUN: 6 mg/dL — ABNORMAL LOW (ref 8–23)
CO2: 23 mmol/L (ref 22–32)
Calcium: 9.1 mg/dL (ref 8.9–10.3)
Chloride: 106 mmol/L (ref 98–111)
Creatinine, Ser: 0.71 mg/dL (ref 0.44–1.00)
GFR, Estimated: >60 mL/min (ref >60)
Glucose, Bld: 144 mg/dL — ABNORMAL HIGH (ref 70–99)
Potassium: 3.3 mmol/L — ABNORMAL LOW (ref 3.5–5.1)
Sodium: 140 mmol/L (ref 135–145)

## 2023-03-08 LAB — GLUCOSE, CAPILLARY
Glucose-Capillary: 144 mg/dL — ABNORMAL HIGH (ref 70–99)
Glucose-Capillary: 143 mg/dL — ABNORMAL HIGH (ref 70–99)
Glucose-Capillary: 218 mg/dL — ABNORMAL HIGH (ref 70–99)
Glucose-Capillary: 150 mg/dL — ABNORMAL HIGH (ref 70–99)
Glucose-Capillary: 196 mg/dL — ABNORMAL HIGH (ref 70–99)
Glucose-Capillary: 187 mg/dL — ABNORMAL HIGH (ref 70–99)

## 2023-03-08 LAB — MAGNESIUM: Magnesium: 1.9 mg/dL (ref 1.7–2.4)

## 2023-03-08 LAB — ANAEROBIC CULTURE W GRAM STAIN

## 2023-03-08 LAB — URIC ACID: Uric Acid, Serum: 6.5 mg/dL (ref 2.5–7.1)

## 2023-03-08 SURGERY — IRRIGATION AND DEBRIDEMENT EXTREMITY
Anesthesia: Choice | Laterality: Right

## 2023-03-08 MED ORDER — POTASSIUM CHLORIDE CRYS ER 20 MEQ PO TBCR
40.0000 meq | EXTENDED_RELEASE_TABLET | Freq: Two times a day (BID) | ORAL | Status: DC
  Administered 2023-03-08 – 2023-03-09 (×4): 40 meq via ORAL
  Filled 2023-03-08 (×4): qty 2

## 2023-03-08 MED ORDER — SODIUM CHLORIDE (PF) 0.9% IJ SOLUTION - NO CHARGE
2.0000 mL | Freq: Once | INTRAMUSCULAR | Status: AC
  Administered 2023-03-08: 2 mL

## 2023-03-08 MED ORDER — IOHEXOL 180 MG/ML  SOLN
3.0000 mL | Freq: Once | INTRAMUSCULAR | Status: AC | PRN
  Administered 2023-03-08: 3 mL via INTRA_ARTERIAL

## 2023-03-08 MED ORDER — LIDOCAINE HCL (PF) 1 % IJ SOLN
5.0000 mL | Freq: Once | INTRAMUSCULAR | Status: AC
  Administered 2023-03-08: 5 mL via INTRADERMAL

## 2023-03-08 NOTE — Hospital Course (Addendum)
Samantha [name] is a 77 y.o. 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   #AGMAEuglycemic DKA, Hx T2DM HbA1c 7.1% Patient has a history of T2DM managed with metformin. She had a previous episode of euglycemic DKA (01/2023). Her vitals have remained tachycardic, hypertensive and tachypneic. Anion gap of 16 (9/27 AM), verified anion gap 17 (9/27 PM). Beta hydroxybutyric acid 3.36. Glucose 169. Lactic acid 1.6. She endorsed nausea without abdominal pain. This morning anion gap remains 16 (9/28). Likely etiology of her AGMA and elevated BHB is euglycemic DKA. During this hospitalization and prior to admission, she has had poor oral intake and decreased appetite likely contributing to the development of euglycemic DKA. Of note, her potassium was elevated 5.6 (9/28). Potassium infusion was stopped. Suspect this will resolve with the insulin she is receiving for DKA treatment.  -Endotool until AG closes x2 -NPO unitl AG closes x2 -BMP q4h until AG closes x2 -Hold potassium. If K<4 restart K.  -Continue D5LR 151mL/hr -Hold SSI and night time coverage for now   #Swollen Right Wrist Patient presented with 2 days of worsening right wrist pain and swelling. She experienced something similar on her left hand which resolved spontaneously. She denied trauma. She was afebrile, hypertensive, tachycardic, but other vitals were stable. On physical exam her right hand, wrist and forearm were swollen, non-erythematous and warm. She did not have pain at rest, but there was pain with active range of motion. She had decreased range of motion in her wrist and fingers. ESR and CRP elevated, uric acid normal. RF and CCP Ortho was consulted and recommended IR aspiration and wrist splint for comfort. Aspiration showed WBC 19, PMN 68%, no organisms on gram stain or  crystals. Hand surgery (Dr. Kerry [name]) was consulted for joint washout. Patient was concerned about heart rate and blood pressure and opted out of the washout and they agreed it was not urgent based on the aspiration results. Dr. Kerry [name] recommended follow-up in 1 week if there is no resolution. Etiology of the wrist swelling is unclear. Not likely septic arthritis, gout, pseudogout or rheumatoid arthritis. Possibly an osteoarthritis flare given arthropathy seen on MRI.    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 She was tachycardic (110's-120's) and hypertensive (180's/90's-100's) during admission. TSH <0.01 and free T4 >5.5. She had a prescription for methimazole that was never filled. Started methimazole 10mg  three times daily, increased metoprolol 50mg  twice daily and started Losartan 50mg  daily. She requires outpatient follow-up with an endocrinologist.    #T2DM HbA1c 7.1% Patients home regimen is metformin 500mg  daily. During admission she was on moderate SSI with nighttime coverage. Restarted metformin 500mg  on discharge.   #Anemia  Hemoglobin low, but increasing, 8.4 (9/27), 7.7 (9/26). Iron 8, TIBC 192, %saturation 4, ferritin 337, reticulocyte % 4.3. She denied shortness of breath or lightheadedness. No overt signs of bleeding. Labs are consistent with an acute phase anemia, in the context of recent urosepsis.   #Hx of Atrial Fibrillation  Patient has a history of atrial fibrillation. Home medication is Eliquis 5mg  twice daily which was started during hospitalization.   #Hypokalemia  Hypomagnesemia  On admission patient had a potassium of 2.5 without EKG changes which was repleted with K. K 3.3 (9/26). She requied oral potassium twice daily  for 2 days. Magnesium 1.4 (9/25), repleted with 2g Mg, now 1.9 (9/26). On discharge, electrolyte abnormalities were  resolved.    #Thrombocytosis Patients platelets elevated 774 (9/27), 692 (9/26). Smear revealed normal platelet morphology with increased platelets. Likely etiology is due to an inflammatory reaction. -Monitor CBC  Discharge instructions PCP f/u on anxiety  Endocrine appointment Samantha Lunch, MD [facility] [address] [place], [place] [address] Phone: [phone]  Fax: [phone]   Hand appt Samantha Pollack, MD Hand & Upper Extremity Surgery [facility] [phone] PT home health    CBC and BMP at PCP to follow up on anemia and hypokalemia  Total contrast received omnipaque 3mL of 180mg /mL, fluoroscopy 24 seconds (0.4 mGy)      SOB with exertion None at rest  Feel ok  No CP Vomiting Yes: dry heaving? Phlem   Wrist, no pain anymore

## 2023-03-08 NOTE — Evaluation (Signed)
Physical Therapy Evaluation Patient Details Name: Samantha [name] MRN: [id number] DOB: Nov 08, 1945 Today's Date: 03/08/2023  History of Present Illness  77yo female who presented to [facility] ED 03/07/23 with R wrist pain and swelling. Admitted for w/u. Of note did have recent hospitalization in August 2024 which resulted in rehab stay prior to return home. 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 Able to mobilize with with increased time, ambulated in room with PT with general supervision and RW but session was limited by elevated HR. She was hospitalized in August this year, went to rehab and recently returned home and had been working with HHPT- she is agreeable to continue with them after DC from this facility. Left in bed with all needs met this morning.         If plan is discharge home, recommend the following: A little help with walking and/or transfers;Assistance with cooking/housework;Assist for transportation;A little help with bathing/dressing/bathroom;Help with stairs or ramp for entrance   Can travel by private vehicle        Equipment Recommendations None recommended by PT (well equipped)  Recommendations for Other Services       Functional Status Assessment Patient has had a recent decline in their functional status and demonstrates the ability to make significant improvements in function in a reasonable and predictable amount of time.     Precautions / Restrictions Precautions Precautions: Fall;Other (comment) Precaution Comments: watch HR Restrictions Weight Bearing Restrictions: No      Mobility  Bed Mobility Overal bed mobility: Modified Independent             General bed mobility comments: HOB elevated, increased time and effort, use of rails    Transfers Overall transfer level: Needs assistance Equipment used:  Rolling walker (2 wheels) Transfers: Sit to/from Stand Sit to Stand: Supervision           General transfer comment: close S and assist for line management, no physical assist given    Ambulation/Gait Ambulation/Gait assistance: Supervision Gait Distance (Feet): 20 Feet Assistive device: Rolling walker (2 wheels) Gait Pattern/deviations: Step-through pattern, Trunk flexed, Decreased step length - right, Decreased step length - left Gait velocity: decreased     General Gait Details: slow but steady with RW, gait distance limited by tachycardia up to 117bpm (from resting rate of low 100s)- she reports this is chronic and PCP had been trying medicines for HR  Stairs            Wheelchair Mobility     Tilt Bed    Modified Rankin (Stroke Patients Only)       Balance Overall balance assessment: Mild deficits observed, not formally tested                                           Pertinent Vitals/Pain Pain Assessment Pain Assessment: No/denies pain    Home Living Family/patient expects to be discharged to:: Private residence Living Arrangements: Other relatives Available Help at Discharge: Family;Available PRN/intermittently;Other (Comment) Type of Home: House Home Access: Ramped entrance;Stairs to enter   Entrance Stairs-Number of Steps: 1   Home Layout: One level Home Equipment: Agricultural consultant (2 wheels) Additional Comments: Pt lives at home with grandson who works during the day. Recently went to  rehab after last hospital stay, function improved but still needed a little help walking/still weak    Prior Function Prior Level of Function : Independent/Modified Independent             Mobility Comments: pt mod I in the home with RW, cane .  Could go to the bathroom, fix something to eat etc. ADLs Comments: pt was I with ADL's,     Extremity/Trunk Assessment   Upper Extremity Assessment Upper Extremity Assessment: Generalized  weakness    Lower Extremity Assessment Lower Extremity Assessment: Generalized weakness    Cervical / Trunk Assessment Cervical / Trunk Assessment: Kyphotic  Communication   Communication Communication: No apparent difficulties Cueing Techniques: Verbal cues  Cognition Arousal: Alert Behavior During Therapy: [facility] for tasks assessed/performed                                            General Comments      Exercises     Assessment/Plan    PT Assessment Patient needs continued PT services  PT Problem List Decreased strength;Decreased balance;Decreased mobility;Decreased knowledge of use of DME;Cardiopulmonary status limiting activity;Decreased activity tolerance;Decreased coordination       PT Treatment Interventions DME instruction;Therapeutic activities;Gait training;Therapeutic exercise;Patient/family education;Stair training;Balance training;Functional mobility training;Neuromuscular re-education    PT Goals (Current goals can be found in the Care Plan section)  Acute Rehab PT Goals Patient Stated Goal: go home PT Goal Formulation: With patient Time For Goal Achievement: 03/22/23 Potential to Achieve Goals: Good    Frequency Min 1X/week     Co-evaluation               AM-PAC PT "6 Clicks" Mobility  Outcome Measure Help needed turning from your back to your side while in a flat bed without using bedrails?: A Little Help needed moving from lying on your back to sitting on the side of a flat bed without using bedrails?: A Little Help needed moving to and from a bed to a chair (including a wheelchair)?: A Little Help needed standing up from a chair using your arms (e.g., wheelchair or bedside chair)?: A Little Help needed to walk in hospital room?: A Little Help needed climbing 3-5 steps with a railing? : A Lot 6 Click Score: 17    End of Session Equipment Utilized During Treatment: Gait belt Activity Tolerance: Patient tolerated  treatment well Patient left: in bed;with call bell/phone within reach Nurse Communication: Mobility status PT Visit Diagnosis: Muscle weakness (generalized) (M62.81);Difficulty in walking, not elsewhere classified (R26.2);Unsteadiness on feet (R26.81)    Time: 3086-5784 PT Time Calculation (min) (ACUTE ONLY): 11 min   Charges:   PT Evaluation $PT Eval Moderate Complexity: 1 Mod   PT General Charges $$ ACUTE PT VISIT: 1 Visit        Nedra Hai, PT, DPT 03/08/23 11:56 AM

## 2023-03-08 NOTE — Progress Notes (Signed)
Orthopedic Tech Progress Note Patient Details:  Samantha [name] 10-18-1945 [id number]  Ortho Devices Type of Ortho Device: Velcro wrist splint Ortho Device/Splint Location: RUE Ortho Device/Splint Interventions: Ordered, Application, Adjustment, Removal   Post Interventions Patient Tolerated: Well Instructions Provided: Adjustment of device  Kratos Ruscitti OTR/L 03/08/2023, 10:38 AM

## 2023-03-08 NOTE — Progress Notes (Signed)
Mobility Specialist: Progress Note   03/08/23 1650  Mobility  Activity Ambulated with assistance to bathroom  Level of Assistance Standby assist, set-up cues, supervision of patient - no hands on  Assistive Device Front wheel walker  Distance Ambulated (ft) 20 ft  Activity Response Tolerated well  Mobility Referral Yes  $Mobility charge 1 Mobility  Mobility Specialist Start Time (ACUTE ONLY) 1526  Mobility Specialist Stop Time (ACUTE ONLY) 1541  Mobility Specialist Time Calculation (min) (ACUTE ONLY) 15 min    During Mobility: HR 123-140  Pt denied hallway ambulation but requested to go to BR - received in bed. SV throughout. Completed void and BM and performed peri care independently. Had c/o feeling SOB and became tachycardic during session. Left in bed with all needs met, call bell in reach.   Maurene Capes Mobility Specialist Please contact via SecureChat or Rehab office at [phone]

## 2023-03-08 NOTE — Consult Note (Signed)
Reason for Consult:Right wrist pain Referring Physician: Johny Sax Time called: [id number] Time at bedside: 0902   Samantha [name] is an 77 y.o. female.  HPI: Samantha [name] came to the ED yesterday with right wrist pain and swelling. It started 3d ago and happened out of the blue. She had a recent similar issue with the left wrist that resolved spontaneously. Outside of that she denies any prior hx/o similar. 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 She is RHD and doesn't work.  Past Medical History:  Diagnosis Date   Allergic rhinitis    Arthritis of both knees    Arthritis of both shoulder regions    Bilateral carpal tunnel syndrome    Cataract    Chronic low back pain    CKD (chronic kidney disease), stage III (HCC)    Decreased vision    Diabetes mellitus    HTN (hypertension)    Hyperlipidemia    Morbid obesity (HCC)    Thyroid disease     Past Surgical History:  Procedure Laterality Date   CATARACT EXTRACTION     COLONOSCOPY     COLONOSCOPY N/A 09/15/2020   Procedure: COLONOSCOPY;  Surgeon: Corbin Ade, MD;  [facility];  Service: Endoscopy;  Laterality: N/A;  ASA II / PM procedure   POLYPECTOMY  09/15/2020   Procedure: POLYPECTOMY;  Surgeon: Corbin Ade, MD;  [facility];  Service: Endoscopy;;   TUBAL LIGATION      Family History  Problem Relation Age of Onset   Hypertension Father    Hypertension Mother    Diabetes Mother    Breast cancer Mother    Hypertension Sister    Cancer Sister        pancreatic   Cancer Sister        pancreatic   Liver disease Sister     Social History:  reports that she has never smoked. She has never used smokeless tobacco. She reports current alcohol use. She reports that she does not use drugs.  Allergies:  Allergies  Allergen Reactions   Jardiance [Empagliflozin] Other (See Comments)    Euglycemic DKA    Medications: I have reviewed the  patient's current medications.  Results for orders placed or performed during the hospital encounter of 03/07/23 (from the past 48 hour(s))  CBC with Differential     Status: Abnormal   Collection Time: 03/07/23 11:03 AM  Result Value Ref Range   WBC 8.7 4.0 - 10.5 K/uL   RBC 2.88 (L) 3.87 - 5.11 MIL/uL   Hemoglobin 7.6 (L) 12.0 - 15.0 g/dL   HCT 62.1 (L) 30.8 - 65.7 %   MCV 88.9 80.0 - 100.0 fL   MCH 26.4 26.0 - 34.0 pg   MCHC 29.7 (L) 30.0 - 36.0 g/dL   RDW 84.6 (H) 96.2 - 95.2 %   Platelets 657 (H) 150 - 400 K/uL   nRBC 0.0 0.0 - 0.2 %   Neutrophils Relative % 74 %   Neutro Abs 6.5 1.7 - 7.7 K/uL   Lymphocytes Relative 16 %   Lymphs Abs 1.4 0.7 - 4.0 K/uL   Monocytes Relative 9 %   Monocytes Absolute 0.8 0.1 - 1.0 K/uL   Eosinophils Relative 0 %   Eosinophils Absolute 0.0 0.0 - 0.5 K/uL   Basophils Relative 0 %   Basophils Absolute 0.0 0.0 - 0.1 K/uL   Immature Granulocytes  1 %   Abs Immature Granulocytes 0.04 0.00 - 0.07 K/uL    Comment: Performed at [facility], [address], [place], [place] [address]  Comprehensive metabolic panel     Status: Abnormal   Collection Time: 03/07/23 11:03 AM  Result Value Ref Range   Sodium 140 135 - 145 mmol/L   Potassium 2.5 (LL) 3.5 - 5.1 mmol/L    Comment: CRITICAL RESULT CALLED TO, READ BACK BY AND VERIFIED WITH C.WILLIAMS RN @1229  09.25.2024 E.AHMED   Chloride 103 98 - 111 mmol/L   CO2 23 22 - 32 mmol/L   Glucose, Bld 166 (H) 70 - 99 mg/dL    Comment: Glucose reference range applies only to samples taken after fasting for at least 8 hours.   BUN <5 (L) 8 - 23 mg/dL   Creatinine, Ser 6.04 0.44 - 1.00 mg/dL   Calcium 8.8 (L) 8.9 - 10.3 mg/dL   Total Protein 6.8 6.5 - 8.1 g/dL   Albumin 2.4 (L) 3.5 - 5.0 g/dL   AST 12 (L) 15 - 41 U/L   ALT 9 0 - 44 U/L   Alkaline Phosphatase 76 38 - 126 U/L   Total Bilirubin 1.2 0.3 - 1.2 mg/dL   GFR, Estimated >54 >09 mL/min    Comment: (NOTE) Calculated using the CKD-EPI Creatinine  Equation (2021)    Anion gap 14 5 - 15    Comment: Performed at [facility], [address] [place], [place] [address]  TSH     Status: Abnormal   Collection Time: 03/07/23 11:03 AM  Result Value Ref Range   TSH <0.010 (L) 0.350 - 4.500 uIU/mL    Comment: Performed by a 3rd Generation assay with a functional sensitivity of <=0.01 uIU/mL. Performed at [facility], [address], [place], [place] [address]   T4, free     Status: Abnormal   Collection Time: 03/07/23 11:03 AM  Result Value Ref Range   Free T4 >5.50 (H) 0.61 - 1.12 ng/dL    Comment: (NOTE) Biotin ingestion may interfere with free T4 tests. If the results are inconsistent with the TSH level, previous test results, or the clinical presentation, then consider biotin interference. If needed, order repeat testing after stopping biotin. Performed at [facility], [address] [place], [place] [address]   C-reactive protein     Status: Abnormal   Collection Time: 03/07/23 11:03 AM  Result Value Ref Range   CRP 20.2 (H) <1.0 mg/dL    Comment: Performed at [facility], [address] [place], [place] [address]  Blood culture (routine x 2)     Status: None (Preliminary result)   Collection Time: 03/07/23 11:03 AM   Specimen: BLOOD LEFT HAND  Result Value Ref Range   Specimen Description BLOOD LEFT HAND    Special Requests      BOTTLES DRAWN AEROBIC AND ANAEROBIC Blood Culture results may not be optimal due to an inadequate volume of blood received in culture bottles   Culture      NO GROWTH < 24 HOURS Performed at [facility], [address] [place], [place] [address]    Report Status PENDING   Blood culture (routine x 2)     Status: None (Preliminary result)   Collection Time: 03/07/23 11:30 AM   Specimen: BLOOD LEFT HAND  Result Value Ref Range   Specimen Description BLOOD LEFT HAND    Special Requests  BOTTLES DRAWN AEROBIC AND ANAEROBIC Blood Culture results may not be  optimal due to an excessive volume of blood received in culture bottles   Culture      NO GROWTH < 24 HOURS Performed at [facility], [address], [place], [place] [address]    Report Status PENDING   Magnesium     Status: Abnormal   Collection Time: 03/07/23 11:32 AM  Result Value Ref Range   Magnesium 1.4 (L) 1.7 - 2.4 mg/dL    Comment: Performed at [facility], [address], [place], [place] [address]  Ferritin     Status: Abnormal   Collection Time: 03/07/23 11:32 AM  Result Value Ref Range   Ferritin 337 (H) 11 - 307 ng/mL    Comment: Performed at [facility], [address], [place], [place] [address]  Iron and TIBC     Status: Abnormal   Collection Time: 03/07/23 11:32 AM  Result Value Ref Range   Iron 8 (L) 28 - 170 ug/dL   TIBC 657 (L) 846 - 962 ug/dL   Saturation Ratios 4 (L) 10.4 - 31.8 %   UIBC 184 ug/dL    Comment: Performed at [facility], [address] [place], [place] [address]  Sedimentation rate     Status: Abnormal   Collection Time: 03/07/23  1:05 PM  Result Value Ref Range   Sed Rate 95 (H) 0 - 22 mm/hr    Comment: Performed at [facility], [address], [place], [place] [address]  Glucose, capillary     Status: Abnormal   Collection Time: 03/07/23  5:30 PM  Result Value Ref Range   Glucose-Capillary 144 (H) 70 - 99 mg/dL    Comment: Glucose reference range applies only to samples taken after fasting for at least 8 hours.   Comment 1 QC Due   Basic metabolic panel     Status: Abnormal   Collection Time: 03/07/23  7:33 PM  Result Value Ref Range   Sodium 138 135 - 145 mmol/L   Potassium 2.9 (L) 3.5 - 5.1 mmol/L   Chloride 102 98 - 111 mmol/L   CO2 22 22 - 32 mmol/L   Glucose, Bld 183 (H) 70 - 99 mg/dL    Comment: Glucose reference range applies only to samples taken after fasting for at least 8 hours.   BUN <5 (L) 8 - 23 mg/dL   Creatinine, Ser 0.10 0.44 - 1.00 mg/dL   Calcium 8.7 (L) 8.9 - 10.3 mg/dL    GFR, Estimated >27 >25 mL/min    Comment: (NOTE) Calculated using the CKD-EPI Creatinine Equation (2021)    Anion gap 14 5 - 15    Comment: Performed at [facility], [address] [place], [place] [address]  Glucose, capillary     Status: Abnormal   Collection Time: 03/07/23  8:50 PM  Result Value Ref Range   Glucose-Capillary 180 (H) 70 - 99 mg/dL    Comment: Glucose reference range applies only to samples taken after fasting for at least 8 hours.  Glucose, capillary     Status: Abnormal   Collection Time: 03/07/23 11:35 PM  Result Value Ref Range   Glucose-Capillary 142 (H) 70 - 99 mg/dL    Comment: Glucose reference range applies only to samples taken after fasting for at least 8 hours.  Glucose, capillary     Status: Abnormal   Collection Time: 03/08/23  3:51  AM  Result Value Ref Range   Glucose-Capillary 143 (H) 70 - 99 mg/dL    Comment: Glucose reference range applies only to samples taken after fasting for at least 8 hours.  CBC     Status: Abnormal   Collection Time: 03/08/23  3:56 AM  Result Value Ref Range   WBC 8.5 4.0 - 10.5 K/uL   RBC 2.97 (L) 3.87 - 5.11 MIL/uL   Hemoglobin 7.7 (L) 12.0 - 15.0 g/dL   HCT 69.6 (L) 78.9 - 38.1 %   MCV 87.5 80.0 - 100.0 fL   MCH 25.9 (L) 26.0 - 34.0 pg   MCHC 29.6 (L) 30.0 - 36.0 g/dL   RDW 01.7 (H) 51.0 - 25.8 %   Platelets 666 (H) 150 - 400 K/uL   nRBC 0.0 0.0 - 0.2 %    Comment: Performed at [facility], [address] [place], [place] [address]  Basic metabolic panel     Status: Abnormal   Collection Time: 03/08/23  3:56 AM  Result Value Ref Range   Sodium 140 135 - 145 mmol/L   Potassium 3.3 (L) 3.5 - 5.1 mmol/L   Chloride 106 98 - 111 mmol/L   CO2 23 22 - 32 mmol/L   Glucose, Bld 144 (H) 70 - 99 mg/dL    Comment: Glucose reference range applies only to samples taken after fasting for at least 8 hours.   BUN 6 (L) 8 - 23 mg/dL   Creatinine, Ser 2.42 0.44 - 1.00 mg/dL   Calcium 9.1 8.9 - 35.3 mg/dL   GFR,  Estimated >61 >44 mL/min    Comment: (NOTE) Calculated using the CKD-EPI Creatinine Equation (2021)    Anion gap 11 5 - 15    Comment: Performed at [facility], [address] [place], [place] [address]  Reticulocytes     Status: Abnormal   Collection Time: 03/08/23  3:56 AM  Result Value Ref Range   Retic Ct Pct 4.3 (H) 0.4 - 3.1 %   RBC. 3.00 (L) 3.87 - 5.11 MIL/uL   Retic Count, Absolute 128.7 19.0 - 186.0 K/uL   Immature Retic Fract 33.2 (H) 2.3 - 15.9 %    Comment: Performed at [facility], [address] [place] [address] [place], [place] [address]  Hemoglobin A1c     Status: Abnormal   Collection Time: 03/08/23  3:56 AM  Result Value Ref Range   Hgb A1c MFr Bld 7.1 (H) 4.8 - 5.6 %    Comment: (NOTE) Pre diabetes:          5.7%-6.4%  Diabetes:              >6.4%  Glycemic control for   <7.0% adults with diabetes    Mean Plasma Glucose 157.07 mg/dL    Comment: Performed at [facility], [address], [place], [place] [address]  Magnesium     Status: None   Collection Time: 03/08/23  3:56 AM  Result Value Ref Range   Magnesium 1.9 1.7 - 2.4 mg/dL    Comment: Performed at [facility], [address] [place], [place] [address]  CBC with Differential/Platelet     Status: Abnormal   Collection Time: 03/08/23  3:56 AM  Result Value Ref Range   WBC 8.6 4.0 - 10.5 K/uL   RBC 3.06 (L) 3.87 - 5.11 MIL/uL   Hemoglobin 7.7 (L) 12.0 - 15.0 g/dL   HCT 24.5 (L) 80.9 - 98.3 %  MCV 86.9 80.0 - 100.0 fL   MCH 25.2 (L) 26.0 - 34.0 pg   MCHC 28.9 (L) 30.0 - 36.0 g/dL   RDW 16.1 (H) 09.6 - 04.5 %   Platelets 692 (H) 150 - 400 K/uL   nRBC 0.2 0.0 - 0.2 %   Neutrophils Relative % 58 %   Neutro Abs 5.0 1.7 - 7.7 K/uL   Lymphocytes Relative 33 %   Lymphs Abs 2.8 0.7 - 4.0 K/uL   Monocytes Relative 8 %   Monocytes Absolute 0.7 0.1 - 1.0 K/uL   Eosinophils Relative 1 %   Eosinophils Absolute 0.1 0.0 - 0.5 K/uL   Basophils Relative 0 %   Basophils Absolute 0.0 0.0 - 0.1  K/uL   Immature Granulocytes 0 %   Abs Immature Granulocytes 0.03 0.00 - 0.07 K/uL    Comment: Performed at [facility], [address] [place], [place] [address]  Glucose, capillary     Status: Abnormal   Collection Time: 03/08/23  8:17 AM  Result Value Ref Range   Glucose-Capillary 218 (H) 70 - 99 mg/dL    Comment: Glucose reference range applies only to samples taken after fasting for at least 8 hours.    MR WRIST RIGHT W WO CONTRAST  Result Date: 03/07/2023 CLINICAL DATA:  Wrist pain, chronic, inflammatory arthritis suspected. ER patient with right wrist pain and swelling since yesterday. Clinical concern for septic arthritis. No recent injury or fever. EXAM: MR OF THE RIGHT WRIST WITHOUT AND WITH CONTRAST TECHNIQUE: Multiplanar multisequence MR imaging of the right wrist was performed both before and after the administration of intravenous contrast. CONTRAST:  7mL GADAVIST GADOBUTROL 1 MMOL/ML IV SOLN COMPARISON:  Radiographs 03/07/2023 and 08/07/2021. No other comparison studies. FINDINGS: Despite efforts by the technologist and patient, mild motion artifact is present on today's exam and could not be eliminated. This reduces exam sensitivity and specificity. Ligaments: The scapholunate and lunotriquetral ligaments appear intact. Triangular fibrocartilage: The triangular fibrocartilage complex appears intact. The ulnar variance is neutral. Tendons: The flexor and extensor tendons appear unremarkable. No significant tenosynovitis or tendon sheath synovial enhancement. Carpal tunnel/median nerve: Unremarkable. Guyon's canal: Unremarkable. Joint/cartilage: There are large radiocarpal, midcarpal and distal radioulnar joint effusions with diffuse synovial enhancement following contrast. As seen radiographically, there are multifocal arthropathic changes throughout the carpal bones with joint space narrowing, osteophytes and subchondral cyst formation, most notably adjacent to the 1st  carpometacarpal and scaphotrapeziotrapezoidal joints. Bones/carpal alignment: The carpal bone alignment is normal. Above, multifocal arthropathy with osteophytes and subchondral cyst formation. Subchondral cysts are also present within the distal radius. No cortical destruction, suspicious marrow edema or enhancement to suggest osteomyelitis. Other: Generalized soft tissue swelling around the wrist with heterogeneous soft tissue enhancement following contrast. No focal periarticular fluid collections. There is edema and enhancement within the visualized hand musculature, especially within the thenar eminence. IMPRESSION: 1. Large radiocarpal, midcarpal and distal radioulnar joint effusions with diffuse synovial enhancement following contrast consistent with synovitis. 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 Joint aspiration recommended. 2. No evidence of osteomyelitis or periarticular abscess. Probable cellulitis. 3. Multifocal arthropathy throughout the wrist, most advanced adjacent to the 1st carpometacarpal and scaphotrapeziotrapezoidal joints. 4. Nonspecific edema and enhancement within the visualized hand musculature, especially within the thenar eminence. Electronically Signed   By: Carey Bullocks M.D.   On: 03/07/2023 19:22   DG Wrist Complete Right  Result Date: 03/07/2023 CLINICAL DATA:  Wrist pain and  swelling EXAM: RIGHT WRIST - COMPLETE 3+ VIEW COMPARISON:  None Available. FINDINGS: No evidence of acute fracture. No joint malalignment. 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 IMPRESSION: 1. No acute fracture or joint malalignment. 2. Moderate triscaphe and radiocarpal degenerative change with Electronically Signed   By: Feliberto Harts M.D.   On: 03/07/2023 12:26    Review of Systems  Constitutional:  Negative for chills, diaphoresis and fever.  HENT:  Negative for ear discharge,  ear pain, hearing loss and tinnitus.   Eyes:  Negative for photophobia and pain.  Respiratory:  Negative for cough and shortness of breath.   Cardiovascular:  Negative for chest pain.  Gastrointestinal:  Negative for abdominal pain, nausea and vomiting.  Genitourinary:  Negative for dysuria, flank pain, frequency and urgency.  Musculoskeletal:  Positive for arthralgias (Right wrist). Negative for back pain, myalgias and neck pain.  Neurological:  Negative for dizziness and headaches.  Hematological:  Does not bruise/bleed easily.  Psychiatric/Behavioral:  The patient is not nervous/anxious.    Blood pressure (!) 167/95, pulse (!) 126, temperature 98.9 F (37.2 C), temperature source Oral, resp. rate 13, height 5\' 1"  (1.549 m), weight 68.4 kg, SpO2 96%. Physical Exam Constitutional:      General: She is not in acute distress.    Appearance: She is well-developed. She is not diaphoretic.  HENT:     Head: Normocephalic and atraumatic.  Eyes:     General: No scleral icterus.       Right eye: No discharge.        Left eye: No discharge.     Conjunctiva/sclera: Conjunctivae normal.  Cardiovascular:     Rate and Rhythm: Normal rate and regular rhythm.  Pulmonary:     Effort: Pulmonary effort is normal. No respiratory distress.  Musculoskeletal:     Cervical back: Normal range of motion.     Comments: Right shoulder, elbow, wrist, digits- no skin wounds, mod TTP wrist, 5 degrees AROM, 10-15 degrees PROM, no instability, no blocks to motion  Sens  Ax/R/M/U intact  Mot   Ax/ R/ PIN/ M/ AIN/ U intact  Rad 2+  Skin:    General: Skin is warm and dry.  Neurological:     Mental Status: She is alert.  Psychiatric:        Mood and Affect: Mood normal.        Behavior: Behavior normal.     Assessment/Plan: Right wrist pain -- Will ask IR to tap. Removable wrist splint for comfort. Have made NPO until we get results of tap.    Freeman Caldron, PA-C Orthopedic  Surgery [phone] 03/08/2023, 9:19 AM

## 2023-03-08 NOTE — Procedures (Signed)
Pre procedural Dx: Right wrist pain Post procedural Dx: Same  Technically successful fluoro guided lavage aspiration of the right radio-carpal joint yielding less than 1 cc of serous joint fluid All aspirated fluid was capped and sent to the laboratory for analysis.    EBL: Trace Complications: None immediate  Katherina Right, MD Pager #: [phone]

## 2023-03-08 NOTE — Plan of Care (Signed)
  Problem: Fluid Volume: Goal: Ability to achieve a balanced intake and output will improve Outcome: Progressing   Problem: Nutritional: Goal: Maintenance of adequate nutrition will improve Outcome: Progressing   Problem: Coping: Goal: Ability to adjust to condition or change in health will improve Outcome: Progressing

## 2023-03-08 NOTE — Anesthesia Preprocedure Evaluation (Signed)
Anesthesia Evaluation  Patient identified by MRN, date of birth, ID band Patient awake    Reviewed: Allergy & Precautions, H&P , NPO status , Patient's Chart, lab work & pertinent test results  Airway Mallampati: II   Neck ROM: full    Dental   Pulmonary neg pulmonary ROS   breath sounds clear to auscultation       Cardiovascular hypertension,  Rhythm:regular Rate:Normal     Neuro/Psych  Neuromuscular disease    GI/Hepatic   Endo/Other  diabetes, Type 2 Hyperthyroidism   Renal/GU Renal InsufficiencyRenal disease     Musculoskeletal  (+) Arthritis ,    Abdominal   Peds  Hematology   Anesthesia Other Findings   Reproductive/Obstetrics                             Anesthesia Physical Anesthesia Plan  ASA: 3  Anesthesia Plan: General   Post-op Pain Management:    Induction: Intravenous  PONV Risk Score and Plan: 3 and Ondansetron, Dexamethasone and Treatment may vary due to age or medical condition  Airway Management Planned: LMA  Additional Equipment:   Intra-op Plan:   Post-operative Plan: Extubation in OR  Informed Consent: I have reviewed the patients History and Physical, chart, labs and discussed the procedure including the risks, benefits and alternatives for the proposed anesthesia with the patient or authorized representative who has indicated his/her understanding and acceptance.     Dental advisory given  Plan Discussed with: CRNA, Anesthesiologist and Surgeon  Anesthesia Plan Comments:        Anesthesia Quick Evaluation

## 2023-03-08 NOTE — Progress Notes (Addendum)
Subjective:   Summary: Samantha [name] is a 77 y.o. 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   Overnight Events: Required Acetaminophen 650mg  once overnight for pain. Mg 1.4 repleted with 2g overnight.   This morning she reports she is feeling well. Her wrist and hand do not hurt at rest, only with movement. She denies chest pain, shortness of breath, palpitations, nausea, vomiting, fevers or chills. We removed the 3 rings on her right hand today, placed them at her bedside in a clear medicine cup. Patient and her daughter Samantha [name] are aware. She was tearful and a bit overwhelmed with everything that has been happening.   Objective:  Vital signs in last 24 hours: Vitals:   03/07/23 2048 03/08/23 0353 03/08/23 0500 03/08/23 0816  BP: (!) 162/92 (!) 177/102  (!) 167/95  Pulse: 100 (!) 110  (!) 126  Resp: 18 18  13   Temp: 99.4 F (37.4 C) 98.7 F (37.1 C)  98.9 F (37.2 C)  TempSrc: Oral Oral  Oral  SpO2: 98% 96%  96%  Weight:   68.4 kg   Height:       Supplemental O2: Room Air SpO2: 96 %   Physical Exam:  Constitutional: well-appearing sitting in bed, in no acute distress. Tearful about all that u=is happening. Cardiovascular: Tachycardic, regular rhythm, no murmurs, rubs or gallops Pulmonary/Chest: normal work of breathing on room air, lungs clear to auscultation bilaterally Abdominal: soft, non-tender, non-distended Skin: warm and dry Extremities: upper extremity pulses 2+. Right hand, wrist and forearm swollen, but not erythematous. No wounds present. Right wrist pain with active ROM.   Filed Weights   03/07/23 0958 03/08/23 0500  Weight: 70.3 kg 68.4 kg     Intake/Output Summary (Last 24 hours) at 03/08/2023 1051 Last data filed at 03/07/2023 1824 Gross per 24 hour  Intake 100 ml  Output --   Net 100 ml   Net IO Since Admission: 100 mL [03/08/23 1051]  Pertinent Labs:    Latest Ref Rng & Units 03/08/2023    3:56 AM 03/07/2023   11:03 AM 01/29/2023    2:39 AM  CBC  WBC 4.0 - 10.5 K/uL 4.0 - 10.5 K/uL 8.5    8.6  8.7  7.2   Hemoglobin 12.0 - 15.0 g/dL 16.1 - 09.6 g/dL 7.7    7.7  7.6  8.8   Hematocrit 36.0 - 46.0 % 36.0 - 46.0 % 26.0    26.6  25.6  30.0   Platelets 150 - 400 K/uL 150 - 400 K/uL 666    692  657  153        Latest Ref Rng & Units 03/08/2023    3:56 AM 03/07/2023    7:33 PM 03/07/2023   11:03 AM  CMP  Glucose 70 - 99 mg/dL 045  409  811   BUN 8 - 23 mg/dL 6  <5  <5   Creatinine 0.44 - 1.00 mg/dL 9.14  7.82  9.56   Sodium 135 - 145 mmol/L 140  138  140   Potassium 3.5 - 5.1 mmol/L 3.3  2.9  2.5   Chloride 98 - 111 mmol/L 106  102  103   CO2 22 - 32 mmol/L 23  22  23  Calcium 8.9 - 10.3 mg/dL 9.1  8.7  8.8   Total Protein 6.5 - 8.1 g/dL   6.8   Total Bilirubin 0.3 - 1.2 mg/dL   1.2   Alkaline Phos 38 - 126 U/L   76   AST 15 - 41 U/L   12   ALT 0 - 44 U/L   9     I personally reviewed interval labs and pertinent results include: Hemoglobin 7.7, platelts 692, potassium 3.3.  Imaging: MR WRIST RIGHT W WO CONTRAST  Result Date: 03/07/2023 CLINICAL DATA:  Wrist pain, chronic, inflammatory arthritis suspected. ER patient with right wrist pain and swelling since yesterday. Clinical concern for septic arthritis. No recent injury or fever. EXAM: MR OF THE RIGHT WRIST WITHOUT AND WITH CONTRAST TECHNIQUE: Multiplanar multisequence MR imaging of the right wrist was performed both before and after the administration of intravenous contrast. CONTRAST:  7mL GADAVIST GADOBUTROL 1 MMOL/ML IV SOLN COMPARISON:  Radiographs 03/07/2023 and 08/07/2021. No other comparison studies. FINDINGS: Despite efforts by the technologist and patient, mild motion artifact is present on today's exam and could not be eliminated. This reduces exam sensitivity and specificity.  Ligaments: The scapholunate and lunotriquetral ligaments appear intact. Triangular fibrocartilage: The triangular fibrocartilage complex appears intact. The ulnar variance is neutral. Tendons: The flexor and extensor tendons appear unremarkable. No significant tenosynovitis or tendon sheath synovial enhancement. Carpal tunnel/median nerve: Unremarkable. Guyon's canal: Unremarkable. Joint/cartilage: There are large radiocarpal, midcarpal and distal radioulnar joint effusions with diffuse synovial enhancement following contrast. As seen radiographically, there are multifocal arthropathic changes throughout the carpal bones with joint space narrowing, osteophytes and subchondral cyst formation, most notably adjacent to the 1st carpometacarpal and scaphotrapeziotrapezoidal joints. Bones/carpal alignment: The carpal bone alignment is normal. Above, multifocal arthropathy with osteophytes and subchondral cyst formation. Subchondral cysts are also present within the distal radius. No cortical destruction, suspicious marrow edema or enhancement to suggest osteomyelitis. Other: Generalized soft tissue swelling around the wrist with heterogeneous soft tissue enhancement following contrast. No focal periarticular fluid collections. There is edema and enhancement within the visualized hand musculature, especially within the thenar eminence. IMPRESSION: 1. Large radiocarpal, midcarpal and distal radioulnar joint effusions with diffuse synovial enhancement following contrast consistent with synovitis. 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 Joint aspiration recommended. 2. No evidence of osteomyelitis or periarticular abscess. Probable cellulitis. 3. Multifocal arthropathy throughout the wrist, most advanced adjacent to the 1st carpometacarpal and scaphotrapeziotrapezoidal joints. 4. Nonspecific edema and enhancement within the visualized hand  musculature, especially within the thenar eminence. Electronically Signed   By: Carey Bullocks M.D.   On: 03/07/2023 19:22   I personally reviewed interval imaging and my interpretation is as follows: No new imaging  EKG: My EKG interpretation is as follows: No new EKG  Assessment/Plan:   Principal Problem:   Septic joint of right wrist [facility])   Patient Summary: LE INGS is a 77 y.o. 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    #Swollen Wrist Possible septic arthritis  Patient presented with 2 days of worsening right wrist pain and swelling. This morning her swelling is stable. She has decreased range of motion and pain with active range of motion. She continues to be hypertensive, tachycardic and tachypneic. She remains afebrile and without leukocytosis. Ortho was consulted, reviewed MRI and saw  the patient. They recommended IR aspiration and a wrist splint for comfort. Etiology of wrist pain and swelling is possibly an inflammatory arthritis such as septic arthritis or pseudogout. Patient does report a history of pseudogout and her left wrist was recently swollen, but resolved on his own. Currently, there is not a clear etiology of the arthritis. -Ortho following, appreciate recommendations  -IR consulted for aspiration -NPO for aspiration   -Holding eliquis for aspiration. Will restart once aspiration complete.  -Wrist splint for comfort  -Tylenol 650mg  q6h as needed   -Oxycodone 5mg  q4h as needed   #Hypokalemia  Hypomagnesemia resolved On admission patient had a potassium of 2.5 without EKG changes which was repleted with K. K 3.3 (9/26). Magnesium 1.4 (9/25), repleted with 2g Mg, now 1.9 (9/26).  -Oral Potassium twice daily   #Anemia  Hemoglobin stable, 7.7 (9/26), 7.6 (9/25). Iron 8, TIBC 192,  %saturation 4, ferritin 337, reticulocyte % 4.3. She denies shortness of breath or lightheadedness. No overt signs of bleeding. Labs are consistent with an acute phase anemia, given the recent urosepsis and possible septic arthritis.  -CBC am  -Transfuse if hemoglobin <7  #Hyperthyroidism Hx of radioactive iodine ablation (11/2022) Patient has a history of toxic multinodular hyperthyroidism. She remains tachycardic in the 100'-120's. She denies palpitations, but can tell her heart is beating fast. TSH <0.01 and free T4 >5.5.  -Continue methimazole 10mg  three times daily  -Ensure outpatient follow-up with endocrinologist at discharge  #T2DM HbA1c 7.1% Patients home regimen is metformin 500mg  daily.  -Hold metformin  -Sensitive SSI   -Monitor CBGs  #Tachycardia Hx SVT Patient has remained tachycardic during admission, 100's-120's. Telemetry sinus tachycardia.mShe denies shortness of breath, chest pain, palpitations or lightheadedness. It is possible that she is tachycardic from her hyperthyroidism or a possible septic arthritis.  -Metoprolol 50mg  twice daily  #Hx of Atrial Fibrillation  Patient has a history of atrial fibrillation. Home medication is Eliquis 5mg  twice daily. She denies heart palpitations. Telemetry sinus tachycardia.  -Holding eliquis 5mg  twice daily for aspiration. Will restart after the aspiration.    Diet: Carb-Modified IVF: None,None VTE: SCDs  Code: Full PT/OT recs: Pending, none. Family Update: Called her daughter, Samantha [name], to update her about the joint aspiration. Samantha [name] will be coming to the hospital around 12.     Dispo: Anticipated discharge to Home in 2 days pending evaluation and treatment of possible septic arthritis .   Kristie Cowman, MS3 Pager Number [phone] Please contact the on call pager after 5 pm and on weekends at [phone].   I personally was present and re-performed the history, physical exam and medical decision-making activities of this  service and have verified that the service and findings are accurately documented in the student's note.   Haidynn Almendarez Colbert Coyer, MD PGY-Internal Medicine Teaching Service Pager: [phone] After 5pm on weekdays and 1pm on weekends: On Call pager [phone].

## 2023-03-08 NOTE — Progress Notes (Signed)
PT Cancellation Note  Patient Details Name: Samantha [name] MRN: [id number] DOB: Jun 18, 1945   Cancelled Treatment:    Reason Eval/Treat Not Completed: Other (comment) other floor staff actively working with pt/she was unavailable, will attempt to return if time/schedule allow   Nedra Hai, PT, DPT 03/08/23 11:31 AM

## 2023-03-08 NOTE — Progress Notes (Signed)
Ortho Progress note  Right wrist pain started 4 days ago.  Evaluated patient this evening, she reports her wrist is doing better after the aspiration.  No pain in wrist until attempting to move.  She has not had any antibiotics at this time.  She has been persistently tachycardic and hypertensive during admission.  She is very worried about her HTN and tachycardia and does not want to undergo surgery at this time as she is concerned for cardiac complications. Normal serum WBC but elevated ESR.  No CRP obtained.  Right wrist ROM approximately 20 flexion and 20 extension.  Wrist joint is warm to palpation, mild to moderately tender.    Aspiration results: Absolute white count 19 cubic mm Cultures pending but gram stain negative for organisms. Crystals negative  Based on typical absolute white count in septic arthritis in native joints of > 50,000 cubic mm, the likelihood of her having septic arthritis is exceedingly low.  We discussed that her exam is concerning but given the aspiration results, she likely does not have septic arthritis.  Swelling may be rheumatologic in nature given the synovitis on MRI or osteoarthritis flare. -recommend following cultures -can consider IV antibiotics if concern for cellulitis or infection elsewhere in body -consider rheumatologic work up to account for elevated ESR, synovitis on MRI -ice, elevate for pain reduction, can consider splint for comfort -pain medications per primary -no surgery at this time.  Patient can follow up with me in clinic in 1 week if symptoms have not resolved.   Shaune Pollack, MD Hand & Upper Extremity Surgery [facility] [phone]

## 2023-03-09 DIAGNOSIS — M02331 Reiter's disease, right wrist: Principal | ICD-10-CM

## 2023-03-09 DIAGNOSIS — E0590 Thyrotoxicosis, unspecified without thyrotoxic crisis or storm: Secondary | ICD-10-CM

## 2023-03-09 LAB — CBC
HCT: 28.5 % — ABNORMAL LOW (ref 36.0–46.0)
Hemoglobin: 8.4 g/dL — ABNORMAL LOW (ref 12.0–15.0)
MCH: 25.5 pg — ABNORMAL LOW (ref 26.0–34.0)
MCHC: 29.5 g/dL — ABNORMAL LOW (ref 30.0–36.0)
MCV: 86.4 fL (ref 80.0–100.0)
Platelets: 744 10*3/uL — ABNORMAL HIGH (ref 150–400)
RBC: 3.3 MIL/uL — ABNORMAL LOW (ref 3.87–5.11)
RDW: 21.2 % — ABNORMAL HIGH (ref 11.5–15.5)
WBC: 8.5 10*3/uL (ref 4.0–10.5)
nRBC: 0.4 % — ABNORMAL HIGH (ref 0.0–0.2)

## 2023-03-09 LAB — BASIC METABOLIC PANEL
Anion gap: 16 — ABNORMAL HIGH (ref 5–15)
Anion gap: 17 — ABNORMAL HIGH (ref 5–15)
BUN: 9 mg/dL (ref 8–23)
BUN: 12 mg/dL (ref 8–23)
CO2: 18 mmol/L — ABNORMAL LOW (ref 22–32)
CO2: 16 mmol/L — ABNORMAL LOW (ref 22–32)
Calcium: 9.2 mg/dL (ref 8.9–10.3)
Calcium: 9.5 mg/dL (ref 8.9–10.3)
Chloride: 106 mmol/L (ref 98–111)
Chloride: 107 mmol/L (ref 98–111)
Creatinine, Ser: 0.84 mg/dL (ref 0.44–1.00)
Creatinine, Ser: 0.9 mg/dL (ref 0.44–1.00)
GFR, Estimated: >60 mL/min (ref >60)
GFR, Estimated: >60 mL/min (ref >60)
Glucose, Bld: 193 mg/dL — ABNORMAL HIGH (ref 70–99)
Glucose, Bld: 218 mg/dL — ABNORMAL HIGH (ref 70–99)
Potassium: 3.9 mmol/L (ref 3.5–5.1)
Potassium: 4.4 mmol/L (ref 3.5–5.1)
Sodium: 140 mmol/L (ref 135–145)
Sodium: 140 mmol/L (ref 135–145)

## 2023-03-09 LAB — GLUCOSE, CAPILLARY
Glucose-Capillary: 154 mg/dL — ABNORMAL HIGH (ref 70–99)
Glucose-Capillary: 169 mg/dL — ABNORMAL HIGH (ref 70–99)
Glucose-Capillary: 170 mg/dL — ABNORMAL HIGH (ref 70–99)
Glucose-Capillary: 197 mg/dL — ABNORMAL HIGH (ref 70–99)
Glucose-Capillary: 206 mg/dL — ABNORMAL HIGH (ref 70–99)
Glucose-Capillary: 215 mg/dL — ABNORMAL HIGH (ref 70–99)

## 2023-03-09 LAB — LACTIC ACID, PLASMA: Lactic Acid, Venous: 1.6 mmol/L (ref 0.5–1.9)

## 2023-03-09 MED ORDER — LOSARTAN POTASSIUM 50 MG PO TABS
50.0000 mg | ORAL_TABLET | Freq: Every day | ORAL | Status: DC
  Administered 2023-03-09 – 2023-03-12 (×4): 50 mg via ORAL
  Filled 2023-03-09 (×4): qty 1

## 2023-03-09 MED ORDER — AMLODIPINE BESYLATE 5 MG PO TABS
5.0000 mg | ORAL_TABLET | Freq: Every day | ORAL | Status: DC
  Administered 2023-03-09 – 2023-03-12 (×4): 5 mg via ORAL
  Filled 2023-03-09 (×4): qty 1

## 2023-03-09 MED ORDER — METHIMAZOLE 10 MG PO TABS
10.0000 mg | ORAL_TABLET | Freq: Three times a day (TID) | ORAL | Status: DC
  Administered 2023-03-09 – 2023-03-12 (×11): 10 mg via ORAL
  Filled 2023-03-09 (×12): qty 1

## 2023-03-09 MED ORDER — APIXABAN 5 MG PO TABS
5.0000 mg | ORAL_TABLET | Freq: Two times a day (BID) | ORAL | Status: DC
  Administered 2023-03-09 – 2023-03-12 (×7): 5 mg via ORAL
  Filled 2023-03-09 (×7): qty 1

## 2023-03-09 MED ORDER — INSULIN ASPART 100 UNIT/ML IJ SOLN: 0.0000 [IU] | Freq: Every day | INTRAMUSCULAR | Status: DC

## 2023-03-09 MED ORDER — INSULIN ASPART 100 UNIT/ML IJ SOLN
0.0000 [IU] | Freq: Three times a day (TID) | INTRAMUSCULAR | Status: DC
  Administered 2023-03-09 (×2): 5 [IU] via SUBCUTANEOUS

## 2023-03-09 MED ORDER — LOSARTAN POTASSIUM 50 MG PO TABS: 25.0000 mg | ORAL_TABLET | Freq: Every day | ORAL | Status: DC

## 2023-03-09 NOTE — Progress Notes (Signed)
Mobility Specialist: Progress Note   03/09/23 1411  Mobility  Activity Dangled on edge of bed  Level of Assistance Modified independent, requires aide device or extra time  Assistive Device None  Activity Response Tolerated fair  Mobility Referral Yes  $Mobility charge 1 Mobility  Mobility Specialist Start Time (ACUTE ONLY) 1034  Mobility Specialist Stop Time (ACUTE ONLY) 1049  Mobility Specialist Time Calculation (min) (ACUTE ONLY) 15 min    Pt was agreeable to mobility session with encouragement - received in bed. ModI for bed mobility (extra time) but wasn't feeling well today and very tired. Had c/o abdominal pain/discomfort that made her stomach feel "hollow like there weren't any organs it" Sat up EOB but requested to lay back down; denied further mobility but said to check back in later. Left in bed with all needs met, call bell in reach.   MS returned around 2pm but BP a little high (Pre Mobility: 169/99) so ambulation was deferred. Pt was motivated to walk but just not feeling well. Left in bed with all needs met, call bell in reach.   Maurene Capes Mobility Specialist Please contact via SecureChat or Rehab office at [phone]

## 2023-03-09 NOTE — Care Management Important Message (Signed)
Important Message  Patient Details  Name: Samantha [name] MRN: [id number] Date of Birth: July 22, 1945   Important Message Given:  Yes - Medicare IM     Sherilyn Banker 03/09/2023, 2:56 PM

## 2023-03-09 NOTE — Inpatient Diabetes Management (Signed)
Inpatient Diabetes Program Recommendations  AACE/ADA: New Consensus Statement on Inpatient Glycemic Control (2015)  Target Ranges:  Prepandial:   less than 140 mg/dL      Peak postprandial:   less than 180 mg/dL (1-2 hours)      Critically ill patients:  140 - 180 mg/dL   Lab Results  Component Value Date   GLUCAP 197 (H) 03/09/2023   HGBA1C 7.1 (H) 03/08/2023    Review of Glycemic Control  Latest Reference Range & Units 03/09/23 00:06 03/09/23 04:19 03/09/23 08:37  Glucose-Capillary 70 - 99 mg/dL 098 (H) 119 (H) 147 (H)   Diabetes history: DM  Outpatient Diabetes medications:  Metformin 500 mg daily Semglee 12 units- NOT TAKING  Current orders for Inpatient glycemic control:  Novolog 0-9 units q 4 hours Inpatient Diabetes Program Recommendations:    Consider reducing Novolog sensitive frequency to tid with meals.  Also consider adding Semglee 8 units daily.   Thanks,  Beryl Meager, RN, BC-ADM Inpatient Diabetes Coordinator Pager [phone]  (8a-5p)

## 2023-03-09 NOTE — Progress Notes (Addendum)
Subjective:   Summary: Samantha [name] is a 77 y.o. 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   Overnight Events: IV labetalol 10mg  given @1731  for BP 182/97  Patient had a hard night last night. She woke up in a hot sweat early this morning and also feels like she "needs to get something up". She has not had anything like this before. She has not vomited, but her stomach is unsettled. She endorses feeling her heart beat fast.She denies hand and wrist pain at rest. She denies headache, blurry vision, chest pain, shortness of breath, vomiting or abdominal pain. She is anxious about her blood pressure and heart rate.  Objective:  Vital signs in last 24 hours: Vitals:   03/08/23 2038 03/09/23 0415 03/09/23 0833 03/09/23 0911  BP: (!) 180/101 (!) 172/101 (!) 162/114   Pulse: 100 (!) 114 (!) 124   Resp: (!) 21 16 20 20   Temp: 98.4 F (36.9 C)  98.6 F (37 C)   TempSrc: Oral  Oral   SpO2: 95% 95% 97%   Weight:      Height:       Supplemental O2: Room Air SpO2: 97 %   Physical Exam:  Constitutional: anxious-appearing sitting in bed, in no acute distress. Anxious about all events in the hospital and appears overwhelmed.  Cardiovascular: Tachycardic, regular rhythm, no murmurs, rubs or gallops Pulmonary/Chest: normal work of breathing on room air, lungs clear to auscultation bilaterally Abdominal: soft, non-tender, non-distended Skin: warm and dry Extremities: upper extremity pulses 2+, no lower extremity edema present. Decreased ROM of right wrist and fingers. Able to open and close her fist slowly. Decreased fine movements in right fingers.   Filed Weights   03/07/23 0958 03/08/23 0500  Weight: 70.3 kg 68.4 kg    No intake or output data in the 24 hours ending 03/09/23 1001 Net IO Since Admission:  100 mL [03/09/23 1001]  Pertinent Labs:    Latest Ref Rng & Units 03/09/2023    8:52 AM 03/08/2023    3:56 AM 03/07/2023   11:03 AM  CBC  WBC 4.0 - 10.5 K/uL 8.5  8.5    8.6  8.7   Hemoglobin 12.0 - 15.0 g/dL 8.4  7.7    7.7  7.6   Hematocrit 36.0 - 46.0 % 28.5  26.0    26.6  25.6   Platelets 150 - 400 K/uL 744  666    692  657        Latest Ref Rng & Units 03/09/2023    8:52 AM 03/08/2023    3:56 AM 03/07/2023    7:33 PM  CMP  Glucose 70 - 99 mg/dL 161  096  045   BUN 8 - 23 mg/dL 9  6  <5   Creatinine 4.09 - 1.00 mg/dL 8.11  9.14  7.82   Sodium 135 - 145 mmol/L 140  140  138   Potassium 3.5 - 5.1 mmol/L 3.9  3.3  2.9   Chloride 98 - 111 mmol/L 106  106  102   CO2 22 - 32 mmol/L 18  23  22    Calcium 8.9 - 10.3 mg/dL 9.2  9.1  8.7    Smear 9/26 Component 1 d ago  WBC MORPHOLOGY  MORPHOLOGY UNREMARKABLE  RBC MORPHOLOGY MORPHOLOGY UNREMARKABLE  Plt Morphology PLATELETS APPEAR INCREASED  Clinical Information new thrombocytosis  Comment: Performed at [facility], [address], [place], [place] [address]   I personally reviewed interval labs and pertinent results include:  Hemoglobin 8.4, Platelet 744 Glucose 193, CO2 18, Anion gap 16 (will get repeat PM CMP)  Imaging: DG FL ASP/INJ INTER (WRIST, ELBOW, ANKLE)  Result Date: 03/08/2023 INDICATION: Right-sided wrist pain with MRI demonstrating right wrist joint effusions. Please perform image guided aspiration for diagnostic purposes. EXAM: DG ASPIRATION/INJ MEDIUM JOINT COMPARISON:  None Available. MEDICATIONS: None FLUOROSCOPY TIME:  24 seconds (0.4 mGy COMPLICATIONS: None immediate TECHNIQUE: Informed written consent was obtained from the patient after a discussion of the risks, benefits and alternatives to treatment. The patient was placed supine on the fluoroscopy table and the right upper extremity placed by the patient's side, palm down. A rolled towel was placed about the palmar aspect of the wrist to encourage a  minimal amount of wrist flexion. The radial carpal joint at the level of the proximal scaphoid was marked fluoroscopically. The dorsal aspect of the wrist were prepped and draped in usual sterile fashion. A timeout was performed prior to the initiation of the procedure. After the overlying soft tissues were anesthetized with 1% lidocaine, a 27 gauge needle was advanced into the radiocarpal joint targeting the inferior 1/3 of the scaphoid. Limited contrast injection confirms intra-articular positioning. Several spot fluoroscopic images were saved for procedural documentation purposes. Next, lavage aspiration was performed yielding less than 1 cc of serous appearing joint fluid. The procedure was terminated. The needle was withdrawn and a dressing was applied. The patient tolerated the procedure well without immediate post procedural complication. The patient was escorted to MRI. FINDINGS: Fluoroscopic imaging demonstrates successful intra-articular injection of contrast. IMPRESSION: Technically successful fluoroscopic guided lavage aspiration of the right wrist yielding less than 1 cc of serous appearing joint fluid. Electronically Signed   By: Simonne Come M.D.   On: 03/08/2023 15:26   I personally reviewed interval imaging and my interpretation is as follows: No new Imaging   EKG: My EKG interpretation is as follows: No new EKG  Assessment/Plan:   Principal Problem:   Septic joint of right wrist [facility])   Patient Summary: Samantha [name] is a 77 y.o. 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    #Swollen Right Wrist  Patient is improving today. The swelling is decreased s/p joint aspiration by IR yesterday 9/26. There is no pain to palpation or at rest. There is decreased ROM of the right wrist, and fingers. She will not rotate or flex her  wrist at all and slowly opens and closes her fingers. She continues to be hypertensive, tachycardic and tachypneic. She remains afebrile and without leukocytosis. Her platelets are increasing, 744 (9/27) and 692 (9/26). Aspiration WBC 32mm3, PMN 68%, monocyte 11%, no crystals and no organisms seen on gram stain. Hand surgery was consulted and after review of the aspiration results decided they did not feel strongly regarding a joint irrigation and debridement urgently or to be started on antibiotics. Per patient , she has a wrist brace that she can use for comfort. OT consulted due to limited movement of fingers and wrist, appreciate any recommendations. Given the results of the aspiration, it is unlikely that the etiology is septic  arthritis or infectious in nature. Unlikely that this is gout or pseudogout without crystals in aspiration. Clinical picture is less consistent with rheumatoid arthritis as she is not presenting with a symmetric polyarthritis with morning stiffness and low WBC on aspiration, despite elevated CRP and ESR. There is evidence of arthropathy in the wrist MRI so it is possible that this is an osteoarthritis flare. The etiology however, remains unclear.  -Tylenol 650mg  q6h as needed  -Oxycodone 5mg  q4h as needed.  -Wrist splint for comfort  -Aspiration culture no growth at <24 hours  -Follow-up in 1 week with Dr. 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 She remains tachycardic (110's-120's) and hypertensive (180's/90's-100's). TSH <0.01 and free T4 >5.5. She reports waking up in a hot sweat this morning. She denies palpitations, chest pain, headache or blurry vision. Methimazole was thought to have been started yesterday, however it was not.  -Methimazole 10mg  three times daily started today 9/27 -Metoprolol 50mg  twice daily   -Started Losartan 50mg  daily today 9/27 -Ensure outpatient follow-up with endocrinologist at discharge  #Hypokalemia resolved Hypomagnesemia resolved K 3.9 (9/27), 3.3 (9/26). Goal potassium >4.  -Oral Potassium twice daily  -Recheck BMP this afternoon  #T2DM HbA1c 7.1%  Patients home regimen is metformin 500mg  daily. Due to blood sugars in the low 200's we increased the SSI sensitivity to moderate. Since the patient has not been on insulin outpatient and we do not intend to send her home on insulin we did not start basal coverage. Additionally, she has had a decreased appetite during this admission and has not eaten much.  -Hold metformin  -Moderate SSI   -Night time coverage added -Monitor CBGs  #Hx of Atrial Fibrillation  Patient has a history of atrial fibrillation. Home medication is Eliquis 5mg  twice daily. She denies heart palpitations. Telemetry sinus tachycardia.  -Started Eliquis 5mg  twice daily 9/27  #Anemia  Hemoglobin increased today, 8.4 (9/27), 7.7 (9/26). Iron 8, TIBC 192, %saturation 4, ferritin 337, reticulocyte % 4.3. She denies shortness of breath or lightheadedness. No overt signs of bleeding. Labs are consistent with an acute phase anemia, given the recent urosepsis.  -CBC am  -Transfuse if hemoglobin <7  Diet: Carb-Modified IVF: None,None VTE:  Eliquis Code: Full PT/OT recs: Home Health, none. Family Update:Daughter, Selena Batten, was called and updated with today's plan.    Dispo: Anticipated discharge to Home in 1 days pending improvement of hypertension and tachycardia.   Kristie Cowman, MS3  Pager Number [phone] Please contact the on call pager after 5 pm and on weekends at [phone].   I personally was present and re-performed the history, physical exam and medical decision-making activities of this service and have verified that the service and findings are accurately documented in the student's note.   Royann Wildasin Colbert Coyer, MD PGY-Internal  Medicine Teaching Service Pager: [phone] After 5pm on weekdays and 1pm on weekends: On Call pager [phone]

## 2023-03-09 NOTE — Plan of Care (Signed)
  Problem: Education: Goal: Ability to describe self-care measures that may prevent or decrease complications (Diabetes Survival Skills Education) will improve Outcome: Progressing Goal: Individualized Educational Video(s) Outcome: Progressing   Problem: Cardiac: Goal: Ability to maintain an adequate cardiac output will improve Outcome: Progressing

## 2023-03-09 NOTE — Evaluation (Addendum)
Occupational Therapy Evaluation Patient Details Name: Samantha [name] MRN: [id number] DOB: Jan 05, 1946 Today's Date: 03/09/2023   History of Present Illness 77yo female who presented to [facility] ED 03/07/23 with R wrist pain and swelling. Admitted for w/u. Of note did have recent hospitalization in August 2024 which resulted in rehab stay prior to return home. 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 Pt needs further assessment to determine how functional her wrist is during ADL performance. Educated pt on exercises to promote improved ROM but during the exercises she reported concerning symptoms of feeling faint and very hot. OT to follow-up with pt when able to reinforce the education and introduce the benefits of modalities before/after ROM. Ended session just slightly early due to concerns of pt symptoms, notified RN and pt later seemed to be improving. No post acute OT needed.         If plan is discharge home, recommend the following: Assistance with cooking/housework    Functional Status Assessment  Patient has had a recent decline in their functional status and demonstrates the ability to make significant improvements in function in a reasonable and predictable amount of time.  Equipment Recommendations  None recommended by OT    Recommendations for Other Services       Precautions / Restrictions Precautions Precautions: Fall;Other (comment) Precaution Comments: watch HR Restrictions Weight Bearing Restrictions: No      Mobility Bed Mobility Overal bed mobility: Modified Independent             General bed mobility comments: cues to use rails to assist with scooting up in bed    Transfers                          Balance Overall balance assessment: Mild deficits observed, not formally  tested                                         ADL either performed or assessed with clinical judgement   ADL       Grooming: Sitting;Set up           Upper Body Dressing : Sitting;Set up   Lower Body Dressing: Minimal assistance;Sitting/lateral leans       Toileting- Clothing Manipulation and Hygiene: Set up         General ADL Comments: needs further ADL assessment, following exercises pt experiencing strange syptoms (see general comments)     Vision         Perception         Praxis         Pertinent Vitals/Pain Pain Assessment Pain Assessment: Faces Faces Pain Scale: Hurts a little bit Pain Location: R wrist with ROM Pain Descriptors / Indicators: Aching, Operative site guarding Pain Intervention(s): Monitored during session, Repositioned     Extremity/Trunk Assessment Upper Extremity Assessment Upper Extremity Assessment: Generalized weakness;RUE deficits/detail;LUE deficits/detail RUE Deficits / Details: R wrist ext about 20* AROM and flexion limited to 15-20* AROM but limited by pain during PROM assessment. Able to acheive 50* ext with AAROM and PROM. Arthritis hx, 1" from full composite fist but during tendon glides she was able to make a full fist. Weak gross  grasp LUE Deficits / Details: Arthritis hx, 1" from full composite fist but during tendon glides she was able to make a full fist.   Lower Extremity Assessment Lower Extremity Assessment: Generalized weakness   Cervical / Trunk Assessment Cervical / Trunk Assessment: Kyphotic   Communication Communication Communication: No apparent difficulties Cueing Techniques: Verbal cues   Cognition Arousal: Alert Behavior During Therapy: WFL for tasks assessed/performed Overall Cognitive Status: Within Functional Limits for tasks assessed                                       General Comments  Pt reports feeling excessively hot and faint following bed level  exercises. HR in the 120s throughout session, BP check prior to session at around 180/99. Pt reports symptoms resolved with rest and use of fan. RN notified of event and report this has frequently been going on with pt.    Exercises General Exercises - Upper Extremity Wrist Flexion: AAROM, Right Wrist Extension: AAROM, Right Other Exercises Other Exercises: Blue therasponge squeezes Other Exercises: tendon glides Other Exercises: AAROM/AROM radial and ulnar deviation RUE   Shoulder Instructions      Home Living Family/patient expects to be discharged to:: Private residence Living Arrangements: Other relatives Available Help at Discharge: Family;Available PRN/intermittently;Other (Comment) Type of Home: House Home Access: Ramped entrance;Stairs to enter Entrance Stairs-Number of Steps: 1   Home Layout: One level     Bathroom Shower/Tub: Tub/shower unit;Curtain   Bathroom Toilet: Standard Bathroom Accessibility: Yes   Home Equipment: Agricultural consultant (2 wheels)   Additional Comments: Pt lives at home with grandson who works during the day. Recently went to rehab after last hospital stay, function improved but still needed a little help walking/still weak      Prior Functioning/Environment Prior Level of Function : Independent/Modified Independent             Mobility Comments: pt mod I in the home with RW, cane .  Could go to the bathroom, fix something to eat etc. ADLs Comments: pt was I with ADL's,        OT Problem List: Pain;Decreased range of motion      OT Treatment/Interventions: Self-care/ADL training;Therapeutic exercise;Therapeutic activities;Patient/family education    OT Goals(Current goals can be found in the care plan section) Acute Rehab OT Goals Patient Stated Goal: none stated OT Goal Formulation: With patient Time For Goal Achievement: 03/23/23 Potential to Achieve Goals: Good ADL Goals Pt Will Perform Grooming: with modified  independence;standing Pt Will Perform Upper Body Dressing: with modified independence;sitting Pt Will Perform Lower Body Dressing: with modified independence;sit to/from stand Additional ADL Goal #1: Pt will demonstrate independent compliance with AROM/AAROM exercises for R wrist to improve ROM within funtional norms  OT Frequency: Min 2X/week    Co-evaluation              AM-PAC OT "6 Clicks" Daily Activity     Outcome Measure Help from another person eating meals?: None Help from another person taking care of personal grooming?: A Little Help from another person toileting, which includes using toliet, bedpan, or urinal?: A Little Help from another person bathing (including washing, rinsing, drying)?: None Help from another person to put on and taking off regular upper body clothing?: A Little Help from another person to put on and taking off regular lower body clothing?: A Little 6 Click Score: 20   End of  Session Nurse Communication: Mobility status  Activity Tolerance: Other (comment) (Limited by pt tolerance, onset of unwell feeling) Patient left: in bed;with call bell/phone within reach  OT Visit Diagnosis: Pain Pain - Right/Left: Right Pain - part of body: Hand (specifically wrist)                Time: 1741-1757 OT Time Calculation (min): 16 min Charges:  OT General Charges $OT Visit: 1 Visit OT Evaluation $OT Eval Low Complexity: 1 Low  03/09/2023  AB, OTR/L  Acute Rehabilitation Services  Office: [phone]   Tristan Schroeder 03/09/2023, 6:24 PM

## 2023-03-09 NOTE — Discharge Instructions (Addendum)
Samantha [name],  You were recently admitted to [facility] for swelling in your right wrist.   Continue taking your home medications with the following changes  Start taking Methimazole 10mg  three times daily  Losartan 50mg  daily  Metoprolol 50mg  twice daily  Metformin ER 500mg  twice daily  Ferrous sulfate 325mg  daily  Continue taking Apixaban (Eliquis) 5mg  twice daily    Please schedule a follow-up appointment with your endocrinologist, Dr. Marquis Lunch at [facility]: Marquis Lunch, MD [facility] [address] [place], [place] [address] Phone: [phone]  Please schedule a follow-up appointment with [facility] if the swelling in your right wrist does not improve within 1 week: Shaune Pollack, MD [facility] [address]  [place], [place] [address] Phone: [phone]  Be on the lookout for a call from home health physical therapy to schedule at home therapy.   You should seek further medical care if you have a headache that does not go away with Tylenol, experience blurry vision, swelling in your wrist is warm, red and painful to move, shortness of breath or dizziness with standing.  We recommend that you see your primary care doctor in about a week to make sure that you continue to improve. We are so glad that you are feeling better.  Sincerely, Dr. Justin Mend, Kristie Cowman (medical student), and the internal medicine team

## 2023-03-10 DIAGNOSIS — M25531 Pain in right wrist: Secondary | ICD-10-CM

## 2023-03-10 DIAGNOSIS — E8729 Other acidosis: Secondary | ICD-10-CM | POA: Diagnosis not present

## 2023-03-10 DIAGNOSIS — E0590 Thyrotoxicosis, unspecified without thyrotoxic crisis or storm: Secondary | ICD-10-CM | POA: Diagnosis not present

## 2023-03-10 LAB — BASIC METABOLIC PANEL
Anion gap: 16 — ABNORMAL HIGH (ref 5–15)
Anion gap: 12 (ref 5–15)
Anion gap: 17 — ABNORMAL HIGH (ref 5–15)
Anion gap: 11 (ref 5–15)
Anion gap: 10 (ref 5–15)
BUN: 13 mg/dL (ref 8–23)
BUN: 11 mg/dL (ref 8–23)
BUN: 11 mg/dL (ref 8–23)
BUN: 11 mg/dL (ref 8–23)
BUN: 9 mg/dL (ref 8–23)
CO2: 16 mmol/L — ABNORMAL LOW (ref 22–32)
CO2: 21 mmol/L — ABNORMAL LOW (ref 22–32)
CO2: 22 mmol/L (ref 22–32)
CO2: 21 mmol/L — ABNORMAL LOW (ref 22–32)
CO2: 22 mmol/L (ref 22–32)
Calcium: 9.4 mg/dL (ref 8.9–10.3)
Calcium: 9.5 mg/dL (ref 8.9–10.3)
Calcium: 9.6 mg/dL (ref 8.9–10.3)
Calcium: 9.4 mg/dL (ref 8.9–10.3)
Calcium: 9 mg/dL (ref 8.9–10.3)
Chloride: 108 mmol/L (ref 98–111)
Chloride: 108 mmol/L (ref 98–111)
Chloride: 106 mmol/L (ref 98–111)
Chloride: 111 mmol/L (ref 98–111)
Chloride: 110 mmol/L (ref 98–111)
Creatinine, Ser: 1.06 mg/dL — ABNORMAL HIGH (ref 0.44–1.00)
Creatinine, Ser: 0.84 mg/dL (ref 0.44–1.00)
Creatinine, Ser: 0.8 mg/dL (ref 0.44–1.00)
Creatinine, Ser: 0.76 mg/dL (ref 0.44–1.00)
Creatinine, Ser: 0.85 mg/dL (ref 0.44–1.00)
GFR, Estimated: 54 mL/min — ABNORMAL LOW (ref >60)
GFR, Estimated: >60 mL/min (ref >60)
GFR, Estimated: >60 mL/min (ref >60)
GFR, Estimated: >60 mL/min (ref >60)
GFR, Estimated: >60 mL/min (ref >60)
Glucose, Bld: 244 mg/dL — ABNORMAL HIGH (ref 70–99)
Glucose, Bld: 249 mg/dL — ABNORMAL HIGH (ref 70–99)
Glucose, Bld: 136 mg/dL — ABNORMAL HIGH (ref 70–99)
Glucose, Bld: 144 mg/dL — ABNORMAL HIGH (ref 70–99)
Glucose, Bld: 137 mg/dL — ABNORMAL HIGH (ref 70–99)
Potassium: 5.6 mmol/L — ABNORMAL HIGH (ref 3.5–5.1)
Potassium: 3.9 mmol/L (ref 3.5–5.1)
Potassium: 3.9 mmol/L (ref 3.5–5.1)
Potassium: 4 mmol/L (ref 3.5–5.1)
Potassium: 3.6 mmol/L (ref 3.5–5.1)
Sodium: 140 mmol/L (ref 135–145)
Sodium: 141 mmol/L (ref 135–145)
Sodium: 145 mmol/L (ref 135–145)
Sodium: 143 mmol/L (ref 135–145)
Sodium: 142 mmol/L (ref 135–145)

## 2023-03-10 LAB — CBC
HCT: 26.6 % — ABNORMAL LOW (ref 36.0–46.0)
Hemoglobin: 7.6 g/dL — ABNORMAL LOW (ref 12.0–15.0)
MCH: 24.6 pg — ABNORMAL LOW (ref 26.0–34.0)
MCHC: 28.6 g/dL — ABNORMAL LOW (ref 30.0–36.0)
MCV: 86.1 fL (ref 80.0–100.0)
Platelets: 709 10*3/uL — ABNORMAL HIGH (ref 150–400)
RBC: 3.09 MIL/uL — ABNORMAL LOW (ref 3.87–5.11)
RDW: 21.2 % — ABNORMAL HIGH (ref 11.5–15.5)
WBC: 10.2 10*3/uL (ref 4.0–10.5)
nRBC: 0.6 % — ABNORMAL HIGH (ref 0.0–0.2)

## 2023-03-10 LAB — GLUCOSE, CAPILLARY
Glucose-Capillary: 177 mg/dL — ABNORMAL HIGH (ref 70–99)
Glucose-Capillary: 195 mg/dL — ABNORMAL HIGH (ref 70–99)
Glucose-Capillary: 203 mg/dL — ABNORMAL HIGH (ref 70–99)
Glucose-Capillary: 225 mg/dL — ABNORMAL HIGH (ref 70–99)
Glucose-Capillary: 206 mg/dL — ABNORMAL HIGH (ref 70–99)
Glucose-Capillary: 186 mg/dL — ABNORMAL HIGH (ref 70–99)
Glucose-Capillary: 121 mg/dL — ABNORMAL HIGH (ref 70–99)
Glucose-Capillary: 90 mg/dL (ref 70–99)
Glucose-Capillary: 162 mg/dL — ABNORMAL HIGH (ref 70–99)
Glucose-Capillary: 72 mg/dL (ref 70–99)
Glucose-Capillary: 125 mg/dL — ABNORMAL HIGH (ref 70–99)
Glucose-Capillary: 132 mg/dL — ABNORMAL HIGH (ref 70–99)
Glucose-Capillary: 122 mg/dL — ABNORMAL HIGH (ref 70–99)
Glucose-Capillary: 134 mg/dL — ABNORMAL HIGH (ref 70–99)
Glucose-Capillary: 133 mg/dL — ABNORMAL HIGH (ref 70–99)
Glucose-Capillary: 138 mg/dL — ABNORMAL HIGH (ref 70–99)
Glucose-Capillary: 150 mg/dL — ABNORMAL HIGH (ref 70–99)
Glucose-Capillary: 133 mg/dL — ABNORMAL HIGH (ref 70–99)
Glucose-Capillary: 130 mg/dL — ABNORMAL HIGH (ref 70–99)
Glucose-Capillary: 137 mg/dL — ABNORMAL HIGH (ref 70–99)
Glucose-Capillary: 127 mg/dL — ABNORMAL HIGH (ref 70–99)

## 2023-03-10 LAB — BETA-HYDROXYBUTYRIC ACID: Beta-Hydroxybutyric Acid: 3.36 mmol/L — ABNORMAL HIGH (ref 0.05–0.27)

## 2023-03-10 LAB — MRSA NEXT GEN BY PCR, NASAL: MRSA by PCR Next Gen: NOT DETECTED

## 2023-03-10 MED ORDER — POTASSIUM CHLORIDE 10 MEQ/100ML IV SOLN
10.0000 meq | INTRAVENOUS | Status: AC
  Administered 2023-03-10: 10 meq via INTRAVENOUS
  Filled 2023-03-10 (×2): qty 100

## 2023-03-10 MED ORDER — DEXTROSE IN LACTATED RINGERS 5 % IV SOLN: INTRAVENOUS | Status: DC

## 2023-03-10 MED ORDER — ENSURE ENLIVE PO LIQD
237.0000 mL | Freq: Two times a day (BID) | ORAL | Status: DC
  Administered 2023-03-11: 237 mL via ORAL

## 2023-03-10 MED ORDER — LACTATED RINGERS IV SOLN: INTRAVENOUS | Status: DC

## 2023-03-10 MED ORDER — INSULIN REGULAR(HUMAN) IN NACL 100-0.9 UT/100ML-% IV SOLN
INTRAVENOUS | Status: DC
  Administered 2023-03-10: 5.5 [IU]/h via INTRAVENOUS
  Filled 2023-03-10: qty 100

## 2023-03-10 MED ORDER — INSULIN GLARGINE-YFGN 100 UNIT/ML ~~LOC~~ SOLN
12.0000 [IU] | Freq: Every day | SUBCUTANEOUS | Status: DC
  Administered 2023-03-10 – 2023-03-11 (×2): 12 [IU] via SUBCUTANEOUS
  Filled 2023-03-10 (×4): qty 0.12

## 2023-03-10 MED ORDER — DEXTROSE 50 % IV SOLN: 0.0000 mL | INTRAVENOUS | Status: DC | PRN

## 2023-03-10 MED ORDER — POTASSIUM CHLORIDE 10 MEQ/100ML IV SOLN
10.0000 meq | Freq: Once | INTRAVENOUS | Status: DC
  Administered 2023-03-10: 10 meq via INTRAVENOUS

## 2023-03-10 MED ORDER — CARMEX CLASSIC LIP BALM EX OINT
TOPICAL_OINTMENT | CUTANEOUS | Status: DC | PRN
  Filled 2023-03-10: qty 10

## 2023-03-10 MED ORDER — INSULIN ASPART 100 UNIT/ML IJ SOLN
0.0000 [IU] | Freq: Three times a day (TID) | INTRAMUSCULAR | Status: DC
  Administered 2023-03-11: 2 [IU] via SUBCUTANEOUS
  Administered 2023-03-11 – 2023-03-12 (×2): 3 [IU] via SUBCUTANEOUS

## 2023-03-10 MED ORDER — DEXTROSE 50 % IV SOLN
1.0000 | Freq: Once | INTRAVENOUS | Status: AC
  Administered 2023-03-10: 50 mL via INTRAVENOUS
  Filled 2023-03-10: qty 50

## 2023-03-10 NOTE — Progress Notes (Signed)
Occupational Therapy Treatment Patient Details Name: Samantha [name] MRN: [id number] DOB: 01-01-1946 Today's Date: 03/10/2023   History of present illness 77yo female who presented to [facility] ED 03/07/23 with R wrist pain and swelling. Admitted for w/u. Of note did have recent hospitalization in August 2024 which resulted in rehab stay prior to return home. 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 Seen for skilled OT treatment session.  Agreeable to participation but does report she is very tired from not having much rest last night.  Pt. Reports decreased pain from previous session in wrist.  Able to return demo from handouts with good tech. Able to utilize foam for squeezes.  Encouraged completing these throughout the day as able.  Provided examples of incorporating functional use of hand/wrist while completing ADLs as other ways of maintaining strength and use.  Cont. With acute OT POC.  Agree with current d/c recommendations.      If plan is discharge home, recommend the following:      Equipment Recommendations       Recommendations for Other Services      Precautions / Restrictions Precautions Precautions: Fall;Other (comment) Precaution Comments: watch HR       Mobility Bed Mobility                    Transfers                         Balance                                           ADL either performed or assessed with clinical judgement   ADL                                              Extremity/Trunk Assessment              Vision       Perception     Praxis      Cognition Arousal: Lethargic Behavior During Therapy: WFL for tasks assessed/performed Overall Cognitive Status: Within Functional Limits for tasks assessed                                          Exercises General Exercises - Upper  Extremity Wrist Flexion: AAROM, Right Wrist Extension: AAROM, Right Other Exercises Other Exercises: Blue therasponge squeezes Other Exercises: tendon glides Other Exercises: AAROM/AROM radial and ulnar deviation RUE Other Exercises: reviewed handouts provided at previous session, pt. reports decreased pain and was able to return demo with good tech.    Shoulder Instructions       General Comments      Pertinent Vitals/ Pain       Pain Assessment Pain Assessment: No/denies pain  Home Living                                          Prior Functioning/Environment  Frequency  Min 2X/week        Progress Toward Goals  OT Goals(current goals can now be found in the care plan section)  Progress towards OT goals: Progressing toward goals     Plan      Co-evaluation                 AM-PAC OT "6 Clicks" Daily Activity     Outcome Measure   Help from another person eating meals?: None Help from another person taking care of personal grooming?: A Little Help from another person toileting, which includes using toliet, bedpan, or urinal?: A Little Help from another person bathing (including washing, rinsing, drying)?: None Help from another person to put on and taking off regular upper body clothing?: A Little Help from another person to put on and taking off regular lower body clothing?: A Little 6 Click Score: 20    End of Session    OT Visit Diagnosis: Pain Pain - Right/Left: Right Pain - part of body: Hand   Activity Tolerance Patient tolerated treatment well   Patient Left in bed;with call bell/phone within reach;with bed alarm set   Nurse Communication Other (comment) (rn present at beginning of session providing meds, states ok to work with pt. today)        Time: 5643-3295 OT Time Calculation (min): 14 min  Charges: OT General Charges $OT Visit: 1 Visit OT Treatments $Therapeutic Exercise: 8-22  mins  Boneta Lucks, COTA/L Acute Rehabilitation [phone]   Alessandra Bevels Lorraine-COTA/L 03/10/2023, 10:19 AM

## 2023-03-10 NOTE — Progress Notes (Signed)
Inpatient Diabetes Program Recommendations  AACE/ADA: New Consensus Statement on Inpatient Glycemic Control (2015)  Target Ranges:  Prepandial:   less than 140 mg/dL      Peak postprandial:   less than 180 mg/dL (1-2 hours)      Critically ill patients:  140 - 180 mg/dL   Lab Results  Component Value Date   GLUCAP 121 (H) 03/10/2023   HGBA1C 7.1 (H) 03/08/2023    Review of Glycemic Control  Latest Reference Range & Units 03/10/23 07:38 03/10/23 08:39  Glucose-Capillary 70 - 99 mg/dL 130 (H) 865 (H)  (H): Data is abnormally high Diabetes history: DM  Outpatient Diabetes medications:  Metformin 500 mg daily Semglee 12 units- NOT TAKING Current orders for Inpatient glycemic control: IV insulin- DKA order set  Inpatient Diabetes Program Recommendations:   Note events overnight.  Now on insulin drip.  May need low dose basal at d/c of insulin drip once acidosis cleared.   Thanks  Beryl Meager, RN, BC-ADM Inpatient Diabetes Coordinator Pager [phone]  (8a-5p)

## 2023-03-10 NOTE — Progress Notes (Addendum)
  \     Ms Archibald was seen at bedside to explain her abnormal labs and the need to transfer her to another unit for insulin infusions due to concerns for DKA ,and also answer any questions she might have.  Patient had anion gap of 17 with bicarb of 16. A follow up beta-hydroxybutyric acid came back as 3.36 with a Capillary glucose of 169 concerning for euglycemic  DKA.Patient is not confused and answering questions appropriately.Denies any abdominal pain and bowel sounds were active.  Of note,patient was admitted and discharged on 8/21 with a diagnosis of Euglycemic DKA that was thought to be from her Jardiance. Pt was discharged on insulin and Jardiance was discontinued, low suspicion for SGLT induced DKA at this time. I wonder if this DKA is a result of starvation from not eating but patient reports eating about 10% of her meals. Differentials are , if she is having euglycemic  DKA vs starvation ketosis. Patient denies any chest pain,less concern for MI at this time. Lactic acid is normal, less concern for infections. With her Graves disease and persistent tachycardia, she is probably spending more energy than normal which could lead to a DKA as well.  It is hard to distinguish between  euglycemic  DKA vs starvation ketosis for this patient at this time.However, both are managed  with D5 infusions so I am starting patient on D5 infusions and have her BMP rechecked about every 4 hours and reassess. Patient is agreeable to the plan.    Kathleen Lime, MD  Internal Medicine Resident Pager: [phone]  03/10/2023, 2:18 AM

## 2023-03-10 NOTE — Progress Notes (Signed)
Subjective:   Summary: Samantha [name] is a 77 y.o. 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   Overnight Events: Anion Gap of 16 in late afternoon. Repeated to verify. Anion gap 17 on repeat with beta-hydroxybutyric acid 3.36. Endotool started.   Patients night was disrupted by starting Endotool and moving to a different floor. She denied chest pain, nausea, and abdominal pain. She reported shortness of breath with exertion, but not with rest. She also endorsed decreased appetite and 1 episode of vomiting phlegm. The decreased appetite is not new however, she has been experiencing this for quite some time. She denies pain in her wrist at rest and feels like it is improving. She has increased motion in her wrist. She remains anxious about her blood pressure and heart rate. We talked today about how her elevated blood pressure, heart rate and decreased appetite are related to her hyperthyroidism. She is aware that she has been started on a medication for hyperthyroidism, but that it will take a few weeks before it has a full effect.   Objective:  Vital signs in last 24 hours: Vitals:   03/10/23 0304 03/10/23 0413 03/10/23 0600 03/10/23 0730  BP: (!) 172/99 (!) 168/102 (!) 154/105 (!) 158/92  Pulse: (!) 105  (!) 115   Resp:  (!) 24 (!) 25   Temp: (!) 79.9 F (26.6 C) 98.6 F (37 C)  97.7 F (36.5 C)  TempSrc: Oral Oral  Oral  SpO2: 95%  93%   Weight: 63.7 kg     Height: 5\' 1"  (1.549 m)      Supplemental O2: Room Air SpO2: 93 %   Physical Exam:  Constitutional: anxious-appearing sitting in bed, in no acute distress Cardiovascular: Tachycardic, regular rhythm, no murmurs, rubs or gallops Pulmonary/Chest: normal work of breathing on room air, lungs clear to auscultation. Decreased bibasilar breath  sounds Abdominal: soft, non-tender, non-distended Skin: warm and dry Extremities: upper extremity pulses 2+, no lower extremity edema present  Filed Weights   03/07/23 0958 03/08/23 0500 03/10/23 0304  Weight: 70.3 kg 68.4 kg 63.7 kg    No intake or output data in the 24 hours ending 03/10/23 0907 Net IO Since Admission: 100 mL [03/10/23 0907]  Pertinent Labs:    Latest Ref Rng & Units 03/10/2023    5:07 AM 03/09/2023    8:52 AM 03/08/2023    3:56 AM  CBC  WBC 4.0 - 10.5 K/uL 10.2  8.5  8.5    8.6   Hemoglobin 12.0 - 15.0 g/dL 7.6  8.4  7.7    7.7   Hematocrit 36.0 - 46.0 % 26.6  28.5  26.0    26.6   Platelets 150 - 400 K/uL 709  744  666    692        Latest Ref Rng & Units 03/10/2023    5:07 AM 03/09/2023    6:58 PM 03/09/2023    8:52 AM  CMP  Glucose 70 - 99 mg/dL 161  096  045   BUN 8 - 23 mg/dL 13  12  9    Creatinine 0.44 - 1.00 mg/dL 4.09  8.11  9.14   Sodium 135 - 145 mmol/L 140  140  140   Potassium 3.5 - 5.1  mmol/L 5.6  4.4  3.9   Chloride 98 - 111 mmol/L 108  107  106   CO2 22 - 32 mmol/L 16  16  18    Calcium 8.9 - 10.3 mg/dL 9.4  9.5  9.2     I personally reviewed interval labs and pertinent results include: CBC: Hemoglobin 7.6, Platelets 709 CMP: Glucose 244, Creatinine 1.06, Potassium 5.6, Bicarb 16  Imaging: I personally reviewed interval imaging and my interpretation is as follows: No new imaging   EKG: My EKG interpretation is as follows: Sinus tachycardia. No evidence of hyperkalemia (no peaked T waves, widened QRS or lengthened PR interval). Anterolateral T wave inversions   Assessment/Plan:   Principal Problem:   Septic joint of right wrist (HCC) Active Problems:   Reactive arthritis of right wrist [facility])   Patient Summary: Samantha [name] is a 77 y.o. 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    #AGMAEuglycemic DKA, Hx T2DM HbA1c 7.1% Patient has a history of T2DM managed with metformin and euglycemic DKA (01/2023). Her vitals have remained tachycardic, hypertensive and tachypneic. Anion gap of 16 (9/27 AM), verified anion gap 17 (9/27 PM). Beta hydroxybutyric acid 3.36. Glucose 169. Lactic acid 1.6. She endorsed nausea without abdominal pain. This morning anion gap remains 16 (9/28). Likely etiology of her AGMA and elevated BHB is euglycemic DKA. During this hospitalization and prior to admission, she has had poor oral intake and decreased appetite likely contributing to the development of euglycemic DKA. Of note, her potassium was elevated 5.6 (9/28). Potassium infusion was stopped. Suspect this will resolve with the insulin she is receiving for DKA treatment.  -Endotool until AG closes  -NPO unitl AG closes  -BMP q4h until AG closes  -Hold potassium. If K<4 restart K.  -Continue D5LR 14mL/hr -Hold SSI and night time coverage for now  #Hyperthyroidism Hx of radioactive iodine ablation (11/2022), Hx SVT   Patient has a history of toxic multinodular hyperthyroidism. Her vitals are improved, but she remains tachycardic (110's-120's) and hypertensive (150's-170's/90's-100's). TSH <0.01 and free T4 >5.5. She denies palpitations, chest pain, headache or blurry vision. She did endorse shortness of breath with walking. Likely cause of tachycardia and hypertension is her currently uncontrolled hyperthyroidism. This was discussed with her as this has been a source of anxiety during this stay. She understands her vitals will most likely not normalize prior to discharge  -Methimazole 10mg  three times daily started today 9/27 -Metoprolol 50mg  twice daily  -Losartan 50mg  daily  -Amlodipine 5mg  daily  -Ensure outpatient follow-up with endocrinologist at discharge  #Swollen right wrist  Swelling remains improved today. She has no pain with palpation or rest. She has increased  flexion, extension, radial and ulnar deviation. Her fingers are still slow to open and close. She has required no pain medication since admission. Her vitals remain elevated, but she is afebrile and without leukocytosis. Her platelets are down trending 709 (9/28), 744 (9/27). Peripheral smear showed normal morphology. OT was consulted due to reduced motion of her wrist. OT did not recommend any therapy at discharge. Unlikely caused by septic arthritis, gout or pseudogout given aspiration results.The clinical picture is less consistent with rheumatoid, but given elevated ESR and CRP along with similar findings in her left wrist last week, RF and anti-CCP were ordered. It is possible that this is an osteoarthritis flare given  the MRI evidence of arthropathy. Etiology of her swollen right wrist is still unclear. -Tylenol 650 q6h as needed  -Oxycodone 5mg  q4h as needed  -Wrist splint for comfort  -Aspiration culture no growth to date  -Follow-up in 1 week with Dr. Kerry [facility] [place]) if there is no resolution   #Anemia  Hemoglobin decreased today 7.7 (9/28), 8.4 (9/27). Iron 8, TIBC 192, %saturation 4, ferritin 337, reticulocyte % 4.3. She did endorse shortness of breath with exertion. Suspect this is related more to her hyperthyroidism than her anemia. No overt signs of bleeding. Labs are consistent with an acute phase anemia.  -CBC am  -Transfuse if hemoglobin <7  #Hx of Atrial Fibrillation  Patient has a history of atrial fibrillation. Home medication is Eliquis 5mg  twice daily. She denies heart palpitations. Telemetry sinus tachycardia.  -Eliquis 5mg  twice daily 9/27    Diet: NPO IVF:  D5LR ,125cc/hr VTE:  Eliquis Code: Full PT/OT recs: Home Health, none. Family Update:   Dispo: Anticipated discharge to Home in 2 days pending resolution of euglycemic DKA.   Samantha [name], MS3 Pager Number [phone] Please contact the on call pager after 5 pm and on weekends at  [phone].

## 2023-03-10 NOTE — Progress Notes (Signed)
Mobility Specialist Progress Note:   03/10/23 1342  Mobility  Activity Ambulated with assistance in hallway  Level of Assistance Contact guard assist, steadying assist  Assistive Device Four wheel walker  Distance Ambulated (ft) 95 ft  Activity Response Tolerated fair  Mobility Referral Yes  $Mobility charge 1 Mobility  Mobility Specialist Start Time (ACUTE ONLY) 1220  Mobility Specialist Stop Time (ACUTE ONLY) 1240  Mobility Specialist Time Calculation (min) (ACUTE ONLY) 20 min   Pre Mobility: 104 HR , 92% SpO2 During Mobility: 122 HR , 94% SpO2 Post Mobility: 103 HR , 95% SpO2  Pt received in bed, agreeable to mobility. Upon standing pt c/o legs feeling wobbly. Pt able to ambulate in hallway with CG for safety. Seated rest break required d/t dizziness and nausea. RN notified. Pt stated that she feels more nauseous when ambulating but is motivated to walk. Pt returned to room and and left in bed with all needs met. RN present in room.   Leory Plowman  Mobility Specialist Please contact via [employer] office at [phone]

## 2023-03-11 LAB — BASIC METABOLIC PANEL
Anion gap: 11 (ref 5–15)
BUN: 9 mg/dL (ref 8–23)
CO2: 22 mmol/L (ref 22–32)
Calcium: 9.1 mg/dL (ref 8.9–10.3)
Chloride: 111 mmol/L (ref 98–111)
Creatinine, Ser: 0.84 mg/dL (ref 0.44–1.00)
GFR, Estimated: >60 mL/min (ref >60)
Glucose, Bld: 131 mg/dL — ABNORMAL HIGH (ref 70–99)
Potassium: 3.8 mmol/L (ref 3.5–5.1)
Sodium: 144 mmol/L (ref 135–145)

## 2023-03-11 LAB — CBC WITH DIFFERENTIAL/PLATELET
Abs Immature Granulocytes: 0.04 10*3/uL (ref 0.00–0.07)
Basophils Absolute: 0 10*3/uL (ref 0.0–0.1)
Basophils Relative: 0 %
Eosinophils Absolute: 0.2 10*3/uL (ref 0.0–0.5)
Eosinophils Relative: 2 %
HCT: 26.1 % — ABNORMAL LOW (ref 36.0–46.0)
Hemoglobin: 7.7 g/dL — ABNORMAL LOW (ref 12.0–15.0)
Immature Granulocytes: 1 %
Lymphocytes Relative: 23 %
Lymphs Abs: 1.8 10*3/uL (ref 0.7–4.0)
MCH: 25.5 pg — ABNORMAL LOW (ref 26.0–34.0)
MCHC: 29.5 g/dL — ABNORMAL LOW (ref 30.0–36.0)
MCV: 86.4 fL (ref 80.0–100.0)
Monocytes Absolute: 0.7 10*3/uL (ref 0.1–1.0)
Monocytes Relative: 9 %
Neutro Abs: 5.1 10*3/uL (ref 1.7–7.7)
Neutrophils Relative %: 65 %
Platelets: 678 10*3/uL — ABNORMAL HIGH (ref 150–400)
RBC: 3.02 MIL/uL — ABNORMAL LOW (ref 3.87–5.11)
RDW: 20.9 % — ABNORMAL HIGH (ref 11.5–15.5)
WBC: 7.8 10*3/uL (ref 4.0–10.5)
nRBC: 1.5 % — ABNORMAL HIGH (ref 0.0–0.2)

## 2023-03-11 LAB — GLUCOSE, CAPILLARY
Glucose-Capillary: 116 mg/dL — ABNORMAL HIGH (ref 70–99)
Glucose-Capillary: 138 mg/dL — ABNORMAL HIGH (ref 70–99)
Glucose-Capillary: 150 mg/dL — ABNORMAL HIGH (ref 70–99)
Glucose-Capillary: 154 mg/dL — ABNORMAL HIGH (ref 70–99)
Glucose-Capillary: 166 mg/dL — ABNORMAL HIGH (ref 70–99)

## 2023-03-11 LAB — RHEUMATOID FACTOR: Rheumatoid fact SerPl-aCnc: 17.3 [IU]/mL — ABNORMAL HIGH (ref <14.0)

## 2023-03-11 MED ORDER — DICLOFENAC SODIUM 1 % EX GEL
4.0000 g | Freq: Four times a day (QID) | CUTANEOUS | Status: DC
  Administered 2023-03-11 (×2): 4 g via TOPICAL
  Filled 2023-03-11: qty 100

## 2023-03-11 MED ORDER — ORAL CARE MOUTH RINSE: 15.0000 mL | OROMUCOSAL | Status: DC | PRN

## 2023-03-11 MED ORDER — FERROUS SULFATE 325 (65 FE) MG PO TABS
325.0000 mg | ORAL_TABLET | Freq: Every day | ORAL | Status: DC
  Administered 2023-03-11 – 2023-03-12 (×2): 325 mg via ORAL
  Filled 2023-03-11 (×2): qty 1

## 2023-03-11 NOTE — Plan of Care (Signed)
  Problem: Education: Goal: Ability to describe self-care measures that may prevent or decrease complications (Diabetes Survival Skills Education) will improve Outcome: Progressing Goal: Individualized Educational Video(s) Outcome: Progressing   Problem: Cardiac: Goal: Ability to maintain an adequate cardiac output will improve Outcome: Progressing   Problem: Health Behavior/Discharge Planning: Goal: Ability to identify and utilize available resources and services will improve Outcome: Progressing Goal: Ability to manage health-related needs will improve Outcome: Progressing   Problem: Fluid Volume: Goal: Ability to achieve a balanced intake and output will improve Outcome: Progressing   Problem: Metabolic: Goal: Ability to maintain appropriate glucose levels will improve Outcome: Progressing   Problem: Nutritional: Goal: Maintenance of adequate nutrition will improve Outcome: Progressing Goal: Maintenance of adequate weight for body size and type will improve Outcome: Progressing   Problem: Respiratory: Goal: Will regain and/or maintain adequate ventilation Outcome: Progressing   Problem: Urinary Elimination: Goal: Ability to achieve and maintain adequate renal perfusion and functioning will improve Outcome: Progressing   Problem: Education: Goal: Ability to describe self-care measures that may prevent or decrease complications (Diabetes Survival Skills Education) will improve Outcome: Progressing Goal: Individualized Educational Video(s) Outcome: Progressing   Problem: Coping: Goal: Ability to adjust to condition or change in health will improve Outcome: Progressing   Problem: Fluid Volume: Goal: Ability to maintain a balanced intake and output will improve Outcome: Progressing   Problem: Health Behavior/Discharge Planning: Goal: Ability to identify and utilize available resources and services will improve Outcome: Progressing Goal: Ability to manage  health-related needs will improve Outcome: Progressing   Problem: Metabolic: Goal: Ability to maintain appropriate glucose levels will improve Outcome: Progressing   Problem: Nutritional: Goal: Maintenance of adequate nutrition will improve Outcome: Progressing Goal: Progress toward achieving an optimal weight will improve Outcome: Progressing   Problem: Skin Integrity: Goal: Risk for impaired skin integrity will decrease Outcome: Progressing   Problem: Tissue Perfusion: Goal: Adequacy of tissue perfusion will improve Outcome: Progressing   Problem: Education: Goal: Knowledge of General Education information will improve Description: Including pain rating scale, medication(s)/side effects and non-pharmacologic comfort measures Outcome: Progressing   Problem: Health Behavior/Discharge Planning: Goal: Ability to manage health-related needs will improve Outcome: Progressing   Problem: Clinical Measurements: Goal: Ability to maintain clinical measurements within normal limits will improve Outcome: Progressing Goal: Will remain free from infection Outcome: Progressing Goal: Diagnostic test results will improve Outcome: Progressing Goal: Respiratory complications will improve Outcome: Progressing Goal: Cardiovascular complication will be avoided Outcome: Progressing   Problem: Activity: Goal: Risk for activity intolerance will decrease Outcome: Progressing   Problem: Nutrition: Goal: Adequate nutrition will be maintained Outcome: Progressing   Problem: Coping: Goal: Level of anxiety will decrease Outcome: Progressing   Problem: Elimination: Goal: Will not experience complications related to bowel motility Outcome: Progressing Goal: Will not experience complications related to urinary retention Outcome: Progressing   Problem: Pain Managment: Goal: General experience of comfort will improve Outcome: Progressing   Problem: Safety: Goal: Ability to remain free  from injury will improve Outcome: Progressing

## 2023-03-11 NOTE — Plan of Care (Signed)
  Problem: Health Behavior/Discharge Planning: Goal: Ability to manage health-related needs will improve Outcome: Progressing   Problem: Education: Goal: Knowledge of General Education information will improve Description: Including pain rating scale, medication(s)/side effects and non-pharmacologic comfort measures Outcome: Progressing   Problem: Health Behavior/Discharge Planning: Goal: Ability to manage health-related needs will improve Outcome: Progressing   Problem: Activity: Goal: Risk for activity intolerance will decrease Outcome: Progressing   Problem: Nutrition: Goal: Adequate nutrition will be maintained Outcome: Progressing

## 2023-03-11 NOTE — Progress Notes (Signed)
Subjective:  Summary: Samantha [name] is a 77 y.o. 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   Overnight Events: Patient able to transition from EndoTool, back on carb modified diet. Overnight team added basal insulin to regimen.   Patient endorses feeling better this morning. Denies any current anxiety, not interested in trying any PRN for anxiety today. Has not had any repeat vomiting of phlegm and feels enough of an appetite to eat some breakfast. Endorses improved right hand pain (very minimal) and believes ROM is improving. Denied CP, SOB, heart palpitations, N/V, abdominal pain, or other acute pain. Reassured patient vitals improving. Patient encouraged to increase PO intake today and to ambulate with assistance.   Objective:  Vital signs in last 24 hours: Vitals:   03/10/23 1923 03/10/23 2101 03/10/23 2311 03/11/23 0400  BP: (!) 154/97 (!) 172/106 (!) 158/103 (!) 156/91  Pulse: (!) 109 (!) 111 96 (!) 108  Resp: (!) 23  19 (!) 24  Temp:      TempSrc:      SpO2: 92%  91% 93%  Weight:    65 kg  Height:       Supplemental O2: Room Air SpO2: 93 %  Physical Exam:  Constitutional: Patient resting comfortably in bed in no acute distress. Able to answer and ask questions appropriately using full sentences.  Cardiovascular: RRR, no r/m/g, no edema observed. Pulmonary/Chest: Clear lungs bilaterally, normal respiratory effort on room air.  Abdominal: Soft, non-tender, non-distended; positive bowel sounds.  MSK: Right hand seems back to baseline (no swelling), pulses and sensation intact, improved right wrist ROM.   Skin: Warm and dry.    Filed Weights   03/08/23 0500 03/10/23 0304 03/11/23 0400  Weight: 68.4 kg 63.7 kg 65 kg    No intake or output data in the 24 hours ending 03/11/23 0640 Net IO Since  Admission: 100 mL [03/11/23 0640]  Pertinent Labs:    Latest Ref Rng & Units 03/11/2023    2:32 AM 03/10/2023    5:07 AM 03/09/2023    8:52 AM  CBC  WBC 4.0 - 10.5 K/uL 7.8  10.2  8.5   Hemoglobin 12.0 - 15.0 g/dL 7.7  7.6  8.4   Hematocrit 36.0 - 46.0 % 26.1  26.6  28.5   Platelets 150 - 400 K/uL 678  709  744        Latest Ref Rng & Units 03/11/2023    2:32 AM 03/10/2023    8:58 PM 03/10/2023    5:42 PM  CMP  Glucose 70 - 99 mg/dL 098  119  147   BUN 8 - 23 mg/dL 9  9  11    Creatinine 0.44 - 1.00 mg/dL 8.29  5.62  1.30   Sodium 135 - 145 mmol/L 144  142  143   Potassium 3.5 - 5.1 mmol/L 3.8  3.6  4.0   Chloride 98 - 111 mmol/L 111  110  111   CO2 22 - 32 mmol/L 22  22  21    Calcium 8.9 - 10.3 mg/dL 9.1  9.0  9.4   I personally reviewed interval labs and pertinent results include: CBC: Hemoglobin 7.7, Platelets 678 CMP: Glucose 166, Creatinine 0.84, Potassium 3.8, Bicarb 22  RF: in process CCP:  in process Aspiration culture with no growth in 2 days  Imaging: No new imaging.   EKG: My EKG interpretation is as follows: Sinus tachycardia. No evidence of hyperkalemia (no peaked T waves, widened QRS or lengthened PR interval). Anterolateral T wave inversions   Assessment/Plan:   Principal Problem:   Septic joint of right wrist (HCC) Active Problems:   Reactive arthritis of right wrist (HCC)   Right wrist pain   Increased anion gap metabolic acidosis   Patient Summary: Samantha [name] is a 77 y.o. 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   #AGMAEuglycemic DKA, Hx T2DM HbA1c 7.1% (resolved) Patient has a history of T2DM managed with metformin and euglycemic DKA (01/2023). Anion gap of 16 (9/27 AM), verified anion gap 17 (9/27 PM). Beta hydroxybutyric acid 3.36. Glucose 169. Lactic acid 1.6. She endorsed nausea  without abdominal pain. Likely etiology of her AGMA and elevated BHB is euglycemic DKA. During this hospitalization and prior to admission, she has had poor oral intake and decreased appetite likely contributing to the development of euglycemic DKA. Morning anion gap from 9/29 closed x 2 (11, 10) and patient was transitioned off Endotool. Morning potassium 3.8 today 9/29. Fasting CBG 166. Encouraged PO intake today.   - Back on CM diet supplemented by ensure shakes  - Monitor daily BMP - Supplement K as needed - Restarted SSI, added basal insulin (semglee 12 units)      #Hyperthyroidism Hx of radioactive iodine ablation (11/2022), Hx SVT   Patient has a history of toxic multinodular hyperthyroidism. Her vitals are improved, but she remains tachycardic (110's-120's) and hypertensive (150's-170's/90's-100's). TSH <0.01 and free T4 >5.5. She denies palpitations, chest pain, headache or blurry vision. She did endorse shortness of breath with walking. Likely cause of tachycardia and hypertension is her currently uncontrolled hyperthyroidism. This was discussed with her as this has been a source of anxiety during this stay. She understands her vitals will most likely not normalize prior to discharge. No PRN labetalol overnight.  - Continue Methimazole 10mg  three times daily (started 9/27) - Continue Metoprolol 50mg  twice daily  - Continue Losartan 50mg  daily  - Continue Amlodipine 5mg  daily  - Ensure outpatient follow-up with endocrinologist at discharge  #Swollen right wrist (improved) Swelling remains improved. She has no pain with palpation or rest. She has increased flexion, extension, radial and ulnar deviation. Still having limited ROM of right wrist with some discomfort. Will have wrist splint in place. No OT follow up needed at discharge.   - Added voltaren gel PRN for joint pain  - Tylenol 650 q6h as needed  - Oxycodone 5mg  q4h as needed  - Wrist splint for comfort  - Aspiration culture no  growth to date  - Follow-up in 1 week with Dr. Kerry [facility] [place]) if there is no resolution   #Anemia (stable) Hemoglobin 7.7 stable, 7.6 yesterday. Iron panel labs consistent with an acute phase anemia. Patient does endorse shortness of breath with exertion. Suspect this is related more to her hyperthyroidism than her anemia. Iron deficit calculated at 1171 mg. Will consider starting IV vs PO iron replacement.  - Monitor daily   - Transfuse if hemoglobin <7  #Hx of Atrial Fibrillation  Patient has a history of atrial fibrillation. Home medication is Eliquis 5mg  twice daily. She denies heart palpitations. Telemetry sinus tachycardia.  -  Continue Eliquis 5mg  twice daily  Diet: CM + ensure shakes VTE:  Eliquis Code: Full PT/OT recs: Home Health, none.  Dispo: Anticipated discharge to Home in 1-2 days pending resolution of euglycemic DKA.   Vedh Ptacek Colbert Coyer, MD PGY-1 Internal Medicine Teaching Service Please contact the on call pager after 5 pm and on weekends at [phone].

## 2023-03-11 NOTE — Progress Notes (Signed)
Mobility Specialist Progress Note:   03/11/23 1457  Mobility  Activity Ambulated with assistance in hallway  Level of Assistance Contact guard assist, steadying assist  Assistive Device Four wheel walker  Distance Ambulated (ft) 145 ft  Activity Response Tolerated well  Mobility Referral Yes  $Mobility charge 1 Mobility  Mobility Specialist Start Time (ACUTE ONLY) 1400  Mobility Specialist Stop Time (ACUTE ONLY) 1420  Mobility Specialist Time Calculation (min) (ACUTE ONLY) 20 min   Pre Mobility: 105 HR , 129/77 BP , 94% SpO2 During Mobility: 126 HR , 88%-94% SpO2 Post Mobility: 115 HR , 93% SpO2  Pt received in bed, agreeable to mobility. Walked with rollator but prefers RW during ambulation. 2x seated rest break required d/t fatigue and DOE. Pursed lip breathing encouraged. Pt denied any dizziness during ambulation. When returning to room pt requesting to use BR. Pt left in BR asymptomatic with all needs met. NT notified.   Leory Plowman  Mobility Specialist Please contact via [employer] office at [phone]

## 2023-03-12 ENCOUNTER — Other Ambulatory Visit (HOSPITAL_COMMUNITY): Payer: Self-pay

## 2023-03-12 LAB — GLUCOSE, CAPILLARY
Glucose-Capillary: 90 mg/dL (ref 70–99)
Glucose-Capillary: 162 mg/dL — ABNORMAL HIGH (ref 70–99)

## 2023-03-12 LAB — CULTURE, BLOOD (ROUTINE X 2)
Culture: NO GROWTH
Culture: NO GROWTH

## 2023-03-12 LAB — BODY FLUID CULTURE W GRAM STAIN: Culture: NO GROWTH

## 2023-03-12 LAB — BASIC METABOLIC PANEL
Anion gap: 9 (ref 5–15)
BUN: 11 mg/dL (ref 8–23)
CO2: 22 mmol/L (ref 22–32)
Calcium: 8.6 mg/dL — ABNORMAL LOW (ref 8.9–10.3)
Chloride: 109 mmol/L (ref 98–111)
Creatinine, Ser: 1 mg/dL (ref 0.44–1.00)
GFR, Estimated: 58 mL/min — ABNORMAL LOW (ref >60)
Glucose, Bld: 90 mg/dL (ref 70–99)
Potassium: 3.2 mmol/L — ABNORMAL LOW (ref 3.5–5.1)
Sodium: 140 mmol/L (ref 135–145)

## 2023-03-12 LAB — RETICULOCYTES
Immature Retic Fract: 35.8 % — ABNORMAL HIGH (ref 2.3–15.9)
RBC.: 3.13 MIL/uL — ABNORMAL LOW (ref 3.87–5.11)
Retic Count, Absolute: 143.7 10*3/uL (ref 19.0–186.0)
Retic Ct Pct: 4.6 % — ABNORMAL HIGH (ref 0.4–3.1)

## 2023-03-12 LAB — CBC
HCT: 24.5 % — ABNORMAL LOW (ref 36.0–46.0)
Hemoglobin: 7.3 g/dL — ABNORMAL LOW (ref 12.0–15.0)
MCH: 25.3 pg — ABNORMAL LOW (ref 26.0–34.0)
MCHC: 29.8 g/dL — ABNORMAL LOW (ref 30.0–36.0)
MCV: 85.1 fL (ref 80.0–100.0)
Platelets: 591 10*3/uL — ABNORMAL HIGH (ref 150–400)
RBC: 2.88 MIL/uL — ABNORMAL LOW (ref 3.87–5.11)
RDW: 21.1 % — ABNORMAL HIGH (ref 11.5–15.5)
WBC: 6.9 10*3/uL (ref 4.0–10.5)
nRBC: 1.4 % — ABNORMAL HIGH (ref 0.0–0.2)

## 2023-03-12 LAB — HEMOGLOBIN AND HEMATOCRIT, BLOOD
HCT: 28.2 % — ABNORMAL LOW (ref 36.0–46.0)
Hemoglobin: 8.1 g/dL — ABNORMAL LOW (ref 12.0–15.0)

## 2023-03-12 LAB — CYCLIC CITRUL PEPTIDE ANTIBODY, IGG/IGA: CCP Antibodies IgG/IgA: 7 U (ref 0–19)

## 2023-03-12 MED ORDER — POTASSIUM CHLORIDE CRYS ER 20 MEQ PO TBCR
40.0000 meq | EXTENDED_RELEASE_TABLET | Freq: Once | ORAL | Status: AC
  Administered 2023-03-12: 40 meq via ORAL
  Filled 2023-03-12: qty 2

## 2023-03-12 MED ORDER — AMLODIPINE BESYLATE 5 MG PO TABS
5.0000 mg | ORAL_TABLET | Freq: Every day | ORAL | 0 refills | Status: DC
  Filled 2023-03-12: qty 30, 30d supply, fill #0

## 2023-03-12 MED ORDER — MAGNESIUM SULFATE 2 GM/50ML IV SOLN
2.0000 g | Freq: Once | INTRAVENOUS | Status: AC
  Administered 2023-03-12: 2 g via INTRAVENOUS
  Filled 2023-03-12: qty 50

## 2023-03-12 MED ORDER — METFORMIN HCL ER 500 MG PO TB24
500.0000 mg | ORAL_TABLET | Freq: Two times a day (BID) | ORAL | 0 refills | Status: DC
  Filled 2023-03-12: qty 60, 30d supply, fill #0

## 2023-03-12 MED ORDER — DICLOFENAC SODIUM 1 % EX GEL
4.0000 g | Freq: Four times a day (QID) | CUTANEOUS | 0 refills | Status: DC
Start: 2023-03-12 — End: 2023-08-15
  Filled 2023-03-12: qty 50, 3d supply, fill #0

## 2023-03-12 MED ORDER — FERROUS SULFATE 325 (65 FE) MG PO TABS
325.0000 mg | ORAL_TABLET | Freq: Every day | ORAL | 0 refills | Status: DC
  Filled 2023-03-12: qty 30, 30d supply, fill #0

## 2023-03-12 MED ORDER — LOSARTAN POTASSIUM 50 MG PO TABS
50.0000 mg | ORAL_TABLET | Freq: Every day | ORAL | 0 refills | Status: DC
  Filled 2023-03-12: qty 30, 30d supply, fill #0

## 2023-03-12 MED ORDER — METHIMAZOLE 10 MG PO TABS
10.0000 mg | ORAL_TABLET | Freq: Three times a day (TID) | ORAL | 0 refills | Status: DC
  Filled 2023-03-12: qty 90, 30d supply, fill #0

## 2023-03-12 NOTE — Discharge Summary (Incomplete)
Name: Samantha [name] MRN: [id number] DOB: 1945-09-22 77 y.o. PCP: Hillery Aldo, NP  Date of Admission: 03/07/2023  9:47 AM Date of Discharge: 03/12/2023 1:36 PM Attending Physician: Dr. Ninetta Lights  Discharge Diagnosis: Principal Problem:   Septic joint of right wrist [name]) Active Problems:   Reactive arthritis of right wrist (HCC)   Right wrist pain   Increased anion gap metabolic acidosis    Discharge Medications: Allergies as of 03/12/2023       Reactions   Jardiance [empagliflozin] Other (See Comments)   Euglycemic DKA     Med Rec must be completed prior to using this [facility]***       Disposition and follow-up:   Samantha [name] was discharged from [facility] in {DISCHARGE CONDITION:19696} condition.  At the hospital follow up visit please address:  1.  Follow-up:  * -   * -   * -   * -     2.  Labs / imaging needed at time of follow-up: {Labs:13245}  3.  Pending labs/ test needing follow-up: ***  4.  Medication Changes  STOPPED  -  -  -   ADDED  -  -  -   MODIFIED  -  -   -   Follow-up Appointments:  Follow-up Information     [facility] up.   Specialty: Home Health Services Contact information: [address] [place] [address] [phone]                 Hospital Course by problem list: Samantha [name] is a 77 y.o. 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   #AGMAEuglycemic DKA, Hx T2DM HbA1c 7.1% Patient has a history of T2DM managed with metformin. She had a previous episode of euglycemic DKA (01/2023). Her vitals have remained tachycardic, hypertensive and tachypneic. Anion gap of 16 (9/27 AM), verified anion gap 17 (9/27 PM). Beta hydroxybutyric acid 3.36. Glucose 169. Lactic acid 1.6. She endorsed  nausea without abdominal pain. Anion gap closed x 2 (9/29) and she was transitioned to subcutaneous insulin. Likely etiology of her AGMA and elevated BHB is euglycemic DKA. During this hospitalization and prior to admission, she has had poor oral intake and decreased appetite likely contributing to the development of euglycemic DKA. Insulin regimen during hospitalization was moderate SSI and insulin glargine 12 units. She was discharged on metformin ER 500mg .    #Swollen Right Wrist (improving) Etiology of the wrist swelling is unclear. Not likely septic arthritis, gout, pseudogout or rheumatoid arthritis. Possibly an osteoarthritis flare given arthropathy seen on MRI. RF slightly elevated at 17 and CCP is still pending. Seems to be back to baseline with no swelling on physical exam today, right wrist ROM continues to improve.   #HTN  Mostly hypertensive throughout admission. Previously on metoprolol 25 mg daily, increased to metoprolol 50 mg BID, added losartan 50 mg daily on 9/27 as well as amlodipine 5 mg daily.   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 She was tachycardic (110's-120's) and hypertensive (180's/90's-100's) during admission. TSH <0.01 and free T4 >5.5. She had a prescription for methimazole that was never filled. Started methimazole 10mg  three times daily, increased metoprolol 50mg  twice daily and started Losartan 50mg  daily.  She requires outpatient follow-up with an endocrinologist.    #T2DM HbA1c 7.1% Patients home regimen is metformin 500mg  daily. During admission she was on moderate SSI with nighttime coverage. Restarted metformin 500mg  on discharge.   #Anemia  Hemoglobin low, but increasing, 8.4 (9/27), 7.7 (9/26). Iron 8, TIBC 192, %saturation 4, ferritin 337, reticulocyte % 4.3. She denied shortness of breath or lightheadedness. No overt signs of bleeding. Labs  are consistent with an acute phase anemia, in the context of recent urosepsis.   #Hx of Atrial Fibrillation  Patient has a history of atrial fibrillation. Home medication is Eliquis 5mg  twice daily which was started during hospitalization.   #Hypokalemia  Hypomagnesemia  On admission patient had a potassium of 2.5 without EKG changes which was repleted with K. K 3.3 (9/26). She requied oral potassium twice daily for 2 days. Magnesium 1.4 (9/25), repleted with 2g Mg, now 1.9 (9/26). She had fluctuations in her potassium during treatment of euglycemic DKA. On discharge, electrolyte abnormalities were resolved.    #Thrombocytosis Patients platelets elevated on admission 657 (9/25), peaked 744 (9/27) and decreased to 591 (9/30).. Smear revealed normal platelet morphology with increased platelets. Likely etiology is due to an inflammatory reaction.     Discharge instructions PCP f/u on anxiety  Endocrine appointment Marquis Lunch, MD [facility] [address] [place], [place] [address] Phone: [phone]  Fax: [phone]   Hand appt Shaune Pollack, MD [facility] [phone] PT home health    CBC and BMP at PCP to follow up on anemia and hypokalemia  Follow up on CCP lab which is pending   Total contrast received omnipaque 3mL of 180mg /mL, fluoroscopy 24 seconds (0.4 mGy)        Discharge Subjective:   Discharge Exam:   Blood pressure (!) 145/74, pulse 92, temperature 97.8 F (36.6 C), temperature source Oral, resp. rate (!) 22, height 5\' 1"  (1.549 m), weight 65.3 kg, SpO2 94%.  Constitutional:***ll-appearing *** sitting in ***, in no acute distress HENT: normocephalic atraumatic, mucous membranes moist Cardiovascular: regular rate and rhythm, no m/r/g, *** JVD Pulmonary/Chest: normal work of breathing on room air, lungs clear to auscultation  bilaterally. ***crackles  Abdominal: soft, non-tender, non-distended. ***fluid wave ***asterixis Neurological: alert & oriented x 3 MSK: no gross abnormalities. ***pitting edema Skin: warm and dry Psych: Normal mood and affect  Pertinent Labs, Studies, and Procedures:     Latest Ref Rng & Units 03/12/2023   11:40 AM 03/12/2023    3:06 AM 03/11/2023    2:32 AM  CBC  WBC 4.0 - 10.5 K/uL  6.9  7.8   Hemoglobin 12.0 - 15.0 g/dL 8.1  7.3  7.7   Hematocrit 36.0 - 46.0 % 28.2  24.5  26.1   Platelets 150 - 400 K/uL  591  678        Latest Ref Rng & Units 03/12/2023    3:06 AM 03/11/2023    2:32 AM 03/10/2023    8:58 PM  CMP  Glucose 70 - 99 mg/dL 90  962  952   BUN 8 - 23 mg/dL 11  9  9    Creatinine 0.44 - 1.00 mg/dL 8.41  3.24  4.01   Sodium 135 - 145 mmol/L 140  144  142   Potassium 3.5 - 5.1 mmol/L 3.2  3.8  3.6   Chloride 98 - 111 mmol/L 109  111  110   CO2  22 - 32 mmol/L 22  22  22    Calcium 8.9 - 10.3 mg/dL 8.6  9.1  9.0     DG FL ASP/INJ INTER (WRIST, ELBOW, ANKLE)  Result Date: 03/08/2023 INDICATION: Right-sided wrist pain with MRI demonstrating right wrist joint effusions. Please perform image guided aspiration for diagnostic purposes. EXAM: DG ASPIRATION/INJ MEDIUM JOINT COMPARISON:  None Available. MEDICATIONS: None FLUOROSCOPY TIME:  24 seconds (0.4 mGy COMPLICATIONS: None immediate TECHNIQUE: Informed written consent was obtained from the patient after a discussion of the risks, benefits and alternatives to treatment. The patient was placed supine on the fluoroscopy table and the right upper extremity placed by the patient's side, palm down. A rolled towel was placed about the palmar aspect of the wrist to encourage a minimal amount of wrist flexion. The radial carpal joint at the level of the proximal scaphoid was marked fluoroscopically. The dorsal aspect of the wrist were prepped and draped in usual sterile fashion. A timeout was performed prior to the initiation of the  procedure. After the overlying soft tissues were anesthetized with 1% lidocaine, a 27 gauge needle was advanced into the radiocarpal joint targeting the inferior 1/3 of the scaphoid. Limited contrast injection confirms intra-articular positioning. Several spot fluoroscopic images were saved for procedural documentation purposes. Next, lavage aspiration was performed yielding less than 1 cc of serous appearing joint fluid. The procedure was terminated. The needle was withdrawn and a dressing was applied. The patient tolerated the procedure well without immediate post procedural complication. The patient was escorted to MRI. FINDINGS: Fluoroscopic imaging demonstrates successful intra-articular injection of contrast. IMPRESSION: Technically successful fluoroscopic guided lavage aspiration of the right wrist yielding less than 1 cc of serous appearing joint fluid. Electronically Signed   By: Simonne Come M.D.   On: 03/08/2023 15:26   MR WRIST RIGHT W WO CONTRAST  Result Date: 03/07/2023 CLINICAL DATA:  Wrist pain, chronic, inflammatory arthritis suspected. ER patient with right wrist pain and swelling since yesterday. Clinical concern for septic arthritis. No recent injury or fever. EXAM: MR OF THE RIGHT WRIST WITHOUT AND WITH CONTRAST TECHNIQUE: Multiplanar multisequence MR imaging of the right wrist was performed both before and after the administration of intravenous contrast. CONTRAST:  7mL GADAVIST GADOBUTROL 1 MMOL/ML IV SOLN COMPARISON:  Radiographs 03/07/2023 and 08/07/2021. No other comparison studies. FINDINGS: Despite efforts by the technologist and patient, mild motion artifact is present on today's exam and could not be eliminated. This reduces exam sensitivity and specificity. Ligaments: The scapholunate and lunotriquetral ligaments appear intact. Triangular fibrocartilage: The triangular fibrocartilage complex appears intact. The ulnar variance is neutral. Tendons: The flexor and extensor tendons  appear unremarkable. No significant tenosynovitis or tendon sheath synovial enhancement. Carpal tunnel/median nerve: Unremarkable. Guyon's canal: Unremarkable. Joint/cartilage: There are large radiocarpal, midcarpal and distal radioulnar joint effusions with diffuse synovial enhancement following contrast. As seen radiographically, there are multifocal arthropathic changes throughout the carpal bones with joint space narrowing, osteophytes and subchondral cyst formation, most notably adjacent to the 1st carpometacarpal and scaphotrapeziotrapezoidal joints. Bones/carpal alignment: The carpal bone alignment is normal. Above, multifocal arthropathy with osteophytes and subchondral cyst formation. Subchondral cysts are also present within the distal radius. No cortical destruction, suspicious marrow edema or enhancement to suggest osteomyelitis. Other: Generalized soft tissue swelling around the wrist with heterogeneous soft tissue enhancement following contrast. No focal periarticular fluid collections. There is edema and enhancement within the visualized hand musculature, especially within the thenar eminence. IMPRESSION: 1. Large radiocarpal, midcarpal and distal radioulnar joint  effusions with diffuse synovial enhancement following contrast consistent with synovitis. 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 Joint aspiration recommended. 2. No evidence of osteomyelitis or periarticular abscess. Probable cellulitis. 3. Multifocal arthropathy throughout the wrist, most advanced adjacent to the 1st carpometacarpal and scaphotrapeziotrapezoidal joints. 4. Nonspecific edema and enhancement within the visualized hand musculature, especially within the thenar eminence. Electronically Signed   By: Carey Bullocks M.D.   On: 03/07/2023 19:22   DG Wrist Complete Right  Result Date: 03/07/2023 CLINICAL DATA:  Wrist pain and swelling EXAM: RIGHT  WRIST - COMPLETE 3+ VIEW COMPARISON:  None Available. FINDINGS: No evidence of acute fracture. No joint malalignment. 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 IMPRESSION: 1. No acute fracture or joint malalignment. 2. Moderate triscaphe and radiocarpal degenerative change with Electronically Signed   By: Feliberto Harts M.D.   On: 03/07/2023 12:26     Discharge Instructions:   Signed: Quinci Gavidia Colbert Coyer, MD [facility] - PGY1 Pager: [phone] 03/12/2023, 1:36 PM    Please contact the on call pager after 5 pm and on weekends at [phone].

## 2023-03-12 NOTE — Plan of Care (Signed)
  Problem: Cardiac: Goal: Ability to maintain an adequate cardiac output will improve Outcome: Progressing   Problem: Health Behavior/Discharge Planning: Goal: Ability to manage health-related needs will improve Outcome: Progressing   Problem: Coping: Goal: Ability to adjust to condition or change in health will improve Outcome: Progressing   Problem: Fluid Volume: Goal: Ability to maintain a balanced intake and output will improve Outcome: Progressing   Problem: Metabolic: Goal: Ability to maintain appropriate glucose levels will improve Outcome: Progressing   Problem: Nutritional: Goal: Maintenance of adequate nutrition will improve Outcome: Progressing Goal: Progress toward achieving an optimal weight will improve Outcome: Progressing   Problem: Education: Goal: Knowledge of General Education information will improve Description: Including pain rating scale, medication(s)/side effects and non-pharmacologic comfort measures Outcome: Progressing   Problem: Health Behavior/Discharge Planning: Goal: Ability to manage health-related needs will improve Outcome: Progressing   Problem: Clinical Measurements: Goal: Ability to maintain clinical measurements within normal limits will improve Outcome: Progressing Goal: Will remain free from infection Outcome: Progressing Goal: Diagnostic test results will improve Outcome: Progressing   Problem: Nutrition: Goal: Adequate nutrition will be maintained Outcome: Progressing

## 2023-03-12 NOTE — Progress Notes (Signed)
Mobility Specialist Progress Note:   03/12/23 1014  Therapy Vitals  Pulse Rate (!) 111  BP (!) 152/85  Mobility  Activity Ambulated with assistance in hallway  Level of Assistance Contact guard assist, steadying assist  Assistive Device Front wheel walker  Distance Ambulated (ft) 160 ft  Activity Response Tolerated well  Mobility Referral Yes  $Mobility charge 1 Mobility  Mobility Specialist Start Time (ACUTE ONLY) 1013  Mobility Specialist Stop Time (ACUTE ONLY) 1043  Mobility Specialist Time Calculation (min) (ACUTE ONLY) 30 min    Pre Mobility: 111 HR, 152/87 (104) BP,  97% SpO2 During Mobility: 135 HR,  94% SpO2 Post Mobility:  120 HR,  95% SpO2  Pt received in bed, agreeable to mobility. Had BM in bathroom before hallway ambulation. Took x1 seated rest break d/t fatigue and DOE. VSS throughout. Pt left in chair with call bell and all needs met.  Samantha [name] Mobility Specialist Please contact via Special educational needs teacher or Rehab office at [phone]

## 2023-03-12 NOTE — Inpatient Diabetes Management (Addendum)
Inpatient Diabetes Program Recommendations  AACE/ADA: New Consensus Statement on Inpatient Glycemic Control   Target Ranges:  Prepandial:   less than 140 mg/dL      Peak postprandial:   less than 180 mg/dL (1-2 hours)      Critically ill patients:  140 - 180 mg/dL    Latest Reference Range & Units 03/10/23 20:51 03/10/23 21:57 03/10/23 23:01 03/11/23 00:10 03/11/23 06:13 03/11/23 11:26 03/11/23 16:17 03/11/23 21:08 03/12/23 06:03  Glucose-Capillary 70 - 99 mg/dL 161 (H) 096 (H) 045 (H) 138 (H) 166 (H) 150 (H) 116 (H) 154 (H) 90    Latest Reference Range & Units 03/08/23 03:56 03/09/23 08:52 03/09/23 18:58 03/10/23 05:07 03/10/23 11:51  CO2 22 - 32 mmol/L 23 18 (L) 16 (L) 16 (L) 21 (L)  Glucose 70 - 99 mg/dL 409 (H) 811 (H) 914 (H) 244 (H) 249 (H)  Anion gap 5 - 15  11 16  (H) 17 (H) 16 (H) 12    Latest Reference Range & Units 01/23/23 18:51 03/08/23 03:56  Hemoglobin A1C 4.8 - 5.6 % 9.8 (H) 7.1 (H)    Latest Reference Range & Units 01/09/23 11:09  HbA1c, POC (controlled diabetic range) 0.0 - 7.0 % 8.7 !   Review of Glycemic Control  Diabetes history: DM2 Outpatient Diabetes medications: Metformin 250 mg daily Current orders for Inpatient glycemic control: Semglee 12 units at bedtime, Novolog 0-15 units TID with meals  Inpatient Diabetes Program Recommendations:    Insulin: Fasting CBG 90 mg/dl today. May want to consider decreasing Semglee to 10 units at bedtime.   Outpatient DM medications: Patient had been taking Jardiance and Metformin for DM but Jardiance was stopped (due to DKA) on 02/01/23 when patient discharged to SNF and was prescribed Semglee 12 units daily, Novolog 0-15 units TID with meals, and Metformin 500 mg daily for DM.  Patient has been taking Metformin 250 mg daily since discharged from rehab. Patient states she would be willing to take additional affordable oral DM medications if prescribed at discharge but she does not want to take insulin as an outpatient.    Outpatient DM supplies: Patient reports she needs Rx for glucose test strips at discharge.  NOTE: Patient admitted on 03/07/23 with septic joint of right wrist, hyperthyroidism, hypokalemia, anemia, and tachycardia. Patient was noted to be in ketoacidosis on 03/10/23 (per note by Dr. Mickie Bail on 03/10/23 (question if acidosis from DKA versus starvation ketosis) and started on IV insulin.  Patient was transitioned from IV to SQ insulin on 03/11/23.  In reviewing chart, noted patient sees Dr. Fransico Him (Endocrinologist) and was last seen on 01/09/23 and per office note A1C was 8.7% on 01/09/23 and it was noted patient had been on Ozempic but it was stopped due to weight loss and patient was prescribed Metformin XR 500 mg daily and Jardiance 25 mg daily. Patient was last inpatient 01/23/23-02/01/23 (had been on Metformin 500 mg daily and Jardiance 25 mg daily prior to admission) and patient was discharged to SNF on Semglee 12 units daily, Novolog 0-15 units TID with meals, and Metformin 500 mg daily (Jardiance stopped due to euglycemic DKA).   Will plan to talk with patient today.  Addendum 03/12/23@13 :22- Spoke with patient about diabetes and home regimen for diabetes control. Patient reports that she is followed by PCP and Dr. Fransico Him for diabetes management and currently taking Metformin 250 mg once a day as an outpatient for diabetes control.  Patient confirms that after she was discharged from the hospital  on 02/01/23 and went to rehab that she was given insulin at the rehab but she reports it was only correction insulin when needed. She states when she was discharged from rehab she was prescribed Metformin 500 mg daily but she states she was given Metformin 250 mg so she takes 1 pill a day.   Patient reports checking glucose daily and glucose has been running okay at home.   Inquired about prior A1C and patient reports last A1C value was in the 8% range. Discussed A1C results (7.1% on 03/08/23 ) and explained that current A1C  indicates an average glucose of 157 mg/dl over the past 2-3 months. Prior A1C was 9.8% on 01/23/23 during prior admission. Discussed glucose and A1C goals. Discussed ketoacidosis and treatment with IV insulin and then transition to SQ insulin. Discussed that she likely needs more than Metformin for DM control as an outpatient. Patient states that she would be willing to take additional DM medications but she prefers not to take insulin if possible and willing to use additional affordable oral DM medications.   Patient verbalized understanding of information discussed and reports no further questions at this time related to diabetes.  Thanks, Orlando Penner, RN, MSN, CDCES Diabetes Coordinator Inpatient Diabetes Program [phone] (Team Pager from 8am to 5pm)

## 2023-03-12 NOTE — Progress Notes (Signed)
Occupational Therapy Treatment Patient Details Name: Samantha [name] MRN: [id number] DOB: 01/16/1946 Today's Date: 03/12/2023   History of present illness 77yo female who presented to [facility] ED 03/07/23 with R wrist pain and swelling. Admitted for w/u. Of note did have recent hospitalization in August 2024 which resulted in rehab stay prior to return home. PMH hyperthyroidism, multinodular goiter s/p recent ablation 6/24, UTI, HTN, DM, HLD, chronic back pain, CKD   OT comments  Patient states her wrist feels much better, she is using it functionally without incident.  OT reviewed HEP issued prior, discussed elevation to minimize swelling.  Patient verbalized understanding, and she is hoping to discharge home today.  OT can follow along to ensure continued progression, and no post acute OT is anticipated.        If plan is discharge home, recommend the following:  Assist for transportation   Equipment Recommendations  None recommended by OT    Recommendations for Other Services      Precautions / Restrictions Precautions Precautions: Fall Restrictions Weight Bearing Restrictions: No       Mobility Bed Mobility               General bed mobility comments: up in the recliner Patient Response: Cooperative  Transfers Overall transfer level: Needs assistance Equipment used: Rolling walker (2 wheels) Transfers: Sit to/from Stand, Bed to chair/wheelchair/BSC Sit to Stand: Modified independent (Device/Increase time)                 Balance Overall balance assessment: Mild deficits observed, not formally tested                                         ADL either performed or assessed with clinical judgement   ADL       Grooming: Modified independent;Sitting           Upper Body Dressing : Modified independent;Sitting   Lower Body Dressing: Modified independent;Sit to/from stand                      Extremity/Trunk Assessment Upper  Extremity Assessment Upper Extremity Assessment: Overall WFL for tasks assessed RUE Deficits / Details: Residual discomfort to R hand and wrist, but no functional or AROM deficits. RUE Sensation: WNL RUE Coordination: WNL LUE Sensation: WNL LUE Coordination: WNL   Lower Extremity Assessment Lower Extremity Assessment: Defer to PT evaluation        Vision Patient Visual Report: No change from baseline     Perception Perception Perception: Not tested   Praxis Praxis Praxis: Not tested    Cognition Arousal: Alert Behavior During Therapy: [facility] for tasks assessed/performed Overall Cognitive Status: Within Functional Limits for tasks assessed                                                             Pertinent Vitals/ Pain       Pain Assessment Pain Assessment: No/denies pain Pain Intervention(s): Monitored during session  Frequency  Min 1X/week        Progress Toward Goals  OT Goals(current goals can now be found in the care plan section)  Progress towards OT goals: Progressing toward goals  Acute Rehab OT Goals OT Goal Formulation: With patient Time For Goal Achievement: 03/23/23 Potential to Achieve Goals: Good  Plan      Co-evaluation                 AM-PAC OT "6 Clicks" Daily Activity     Outcome Measure   Help from another person eating meals?: None Help from another person taking care of personal grooming?: None Help from another person toileting, which includes using toliet, bedpan, or urinal?: None Help from another person bathing (including washing, rinsing, drying)?: None Help from another person to put on and taking off regular upper body clothing?: None Help from another person to put on and taking off regular lower body clothing?: None 6 Click Score: 24    End of Session Equipment Utilized During Treatment: Rolling walker (2  wheels)  OT Visit Diagnosis: Pain Pain - Right/Left: Right Pain - part of body: Hand   Activity Tolerance Patient tolerated treatment well   Patient Left in chair;with call bell/phone within reach   Nurse Communication Mobility status        Time: 1209-1221 OT Time Calculation (min): 12 min  Charges: OT General Charges $OT Visit: 1 Visit OT Treatments $Self Care/Home Management : 8-22 mins  03/12/2023  RP, OTR/L  Acute Rehabilitation Services  Office:  [phone]   Suzanna Obey 03/12/2023, 1:03 PM

## 2023-03-12 NOTE — Discharge Summary (Signed)
Name: Samantha [name] MRN: [id number] DOB: 06-Mar-1946 77 y.o. PCP: Hillery Aldo, NP  Date of Admission: 03/07/2023  9:47 AM Date of Discharge: 03/12/2023 Attending Physician: Ginnie Smart, MD  Discharge Diagnosis: Principal Problem:   Septic joint of right wrist [facility]) Active Problems:   Reactive arthritis of right wrist (HCC)   Right wrist pain   Increased anion gap metabolic acidosis Hyperthyroidism Hypertension Normocytic Anemia Thrombocytosis Paroxysmal Atrial fibrillation Hypokalemia Hypomagnesemia  Discharge Medications: Allergies as of 03/12/2023       Reactions   Jardiance [empagliflozin] Other (See Comments)   Euglycemic DKA        Medication List     STOP taking these medications    insulin aspart 100 UNIT/ML injection Commonly known as: novoLOG   insulin glargine-yfgn 100 UNIT/ML injection Commonly known as: SEMGLEE   loperamide HCl 1 MG/7.5ML suspension Commonly known as: IMODIUM   ondansetron 4 MG tablet Commonly known as: ZOFRAN   OVER THE COUNTER MEDICATION       TAKE these medications    acetaminophen 500 MG tablet Commonly known as: TYLENOL Take 500-1,000 mg by mouth every 6 (six) hours as needed for mild pain.   amLODipine 5 MG tablet Commonly known as: NORVASC Take 1 tablet (5 mg total) by mouth daily. Start taking on: March 13, 2023   Eliquis 5 MG Tabs tablet Generic drug: apixaban Take 1 tablet (5 mg total) by mouth 2 (two) times daily.   feeding supplement Liqd Take 237 mLs by mouth 2 (two) times daily between meals.   FeroSul 325 (65 Fe) MG tablet Generic drug: ferrous sulfate Take 1 tablet (325 mg total) by mouth daily with breakfast. Start taking on: March 13, 2023   losartan 50 MG tablet Commonly known as: COZAAR Take 1 tablet (50 mg total) by mouth daily. Start taking on: March 13, 2023   melatonin 3 MG Tabs tablet Take 1 tablet (3 mg total) by mouth at bedtime as needed (insomnia).   metFORMIN 500  MG 24 hr tablet Commonly known as: GLUCOPHAGE-XR Take 1 tablet (500 mg total) by mouth 2 (two) times daily with a meal. What changed: when to take this   methimazole 10 MG tablet Commonly known as: TAPAZOLE Take 1 tablet (10 mg total) by mouth 3 (three) times daily. What changed: when to take this   metoprolol tartrate 50 MG tablet Commonly known as: LOPRESSOR Take 1 tablet (50 mg total) by mouth 2 (two) times daily.   Voltaren Arthritis Pain 1 % Gel Generic drug: diclofenac Sodium Apply 4 g topically 4 (four) times daily.        Disposition and follow-up:   Ms.Samantha [name] was discharged from [facility] in Stable condition.  At the hospital follow up visit please address:  Hyperthyroidism: ensure pt is taking methimazole 10 mg TID. Ensure she has follow up with endocrinology for her hyperthyroidism.  Hand Pain: Follow up CCP level given RF was elevated.   Hypertension: Ensure her regimen controls her BP adequately.  Thrombocytosis: Ensure it has resolved.  Anemia: Repeat CBC to ensure Hgb improved. Pt was discharged on oral iron. Discontinue when appropriate.   DMII: There was concern for euglycemic DKA. Her metformin was changed to BID. She had slight acidosis and was started on insulin gtt but unsure if this was entirely DKA as she was feeling unwell and could be component of starvation ketoacidosis. Evaluate for any insulin needs at OP follow up. Currently she does  not need it.   2.  Labs / imaging needed at time of follow-up: CBC, BMP, TSH in 4-6 weeks.  3.  Pending labs/ test needing follow-up: CCP  Follow-up Appointments:  Follow-up Information     [facility].   Specialty: Home Health Services Contact information: [address] [place] [address] [phone]                 Hospital Course by problem list: HPI per Dr. Justin Mend, Samantha [name] is a 77 y.o. female with a history of CKD, DM, HTN, HLD,  hyperthyroidism s/p iodine ablation June 2024, and paroxysmal atrial fibrillation on eliquis, and SVT tachycardia who presents to the ED for right wrist pain and swelling. Patient's daughter, Samantha [name], is at bedside.    Per patient:  - Pain and swelling on right hand started 2 days ago and has been worsening. Difficult to remove rings on right hand.  - Experienced something similar on left hand weeks ago but did nothing and swelling went down on its own.  - Does not recall any injury or bug bite to the hand.  - Patient denies any history of gout or pseudogout.  - Endorses warmth on hand but no discharge, overlying skin changes, or changes in sensation.  - Swelling and pain are limiting movement in right hand and arm, with patient unable to make a fist.  - Patient denies fevers, chills, sick contacts, CP, SOB, or acute pain or swelling elsewhere in her body.  - Endorses some fatigue and feeling like her heart is racing.  - Endorses one episode of vomiting in the ED, but believes this was related to taking her top denture out feeling nauseated after.    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 Underwent iodine ablation June 2024 and has been following with PCP since to get thyroid levels under control. Given prescription for methimazole about a month ago when TSH 0.028, free T4 17.5 and free T3 of 7.7, Patent did not start it, seemed unaware that she needed to take it. Plan was to follow up with endocrinology, as not yet.    - Patient also recently hospitalized from 8/13-8/22 for sepsis secondary to UTI with Klebsiella pneumonia. Patient was discharged to SNF for PT/OT. SNF was [facility] in [place], where patient stayed for 2 weeks before being discharged home. Per patient and daughter Samantha [name], patient may knot have been taking all indicated medications consistently after discharge.   ED  course:  Patient arrived to ED afebrile, tachycardic (HR 114), with an elevated blood pressure of 192/111, with normal respiratory effort on room air. Pain rated 6 out of 10. CBC showed a Hgb 7.5, platelets 657, K 2.5, TSH < 0.010, free T4 > 5.5, CRP 20.1, Mg 1.4. DG right wrist showing no acute fracture, and moderate degenerative changes. EKG sinus tachycardia HR 114. Patient given LR 500 mL bolus, PO potassium tablet 40 mEq, potassium chloride 10 mEq x2, and 1 dose of metoprolol 50 mg. Charma Igo [facility] PA was consulted in the ED who recommended MRI wrist to further assess for effusions.   #AGMAStarvation Ketoacidosis vs Euglycemic DKA, Hx T2DM HbA1c 7.1% Patient has a history of T2DM managed with metformin. She had a previous episode of euglycemic DKA (01/2023). Her vitals have remained tachycardic, hypertensive and tachypneic. Anion gap of 16 (9/27 AM), verified anion gap 17 (9/27  PM). Beta hydroxybutyric acid 3.36. Glucose 169. Lactic acid 1.6. She endorsed nausea without abdominal pain. Anion gap closed x 2 (9/29) and she was transitioned to subcutaneous insulin. Likely etiology of her AGMA and elevated BHB is starvation ketoacidosis vs euglycemic DKA. During this hospitalization and prior to admission, she has had poor oral intake and decreased appetite likely contributing to the development of her acidosis. Insulin regimen during hospitalization was moderate SSI and insulin glargine 12 units. Given good A1c this visit at 7.1 will hold off on any insulin administration at this time.   #Swollen Right Wrist (improving) Etiology of the wrist swelling is unclear. Not likely septic arthritis, gout, pseudogout or rheumatoid arthritis. Possibly an osteoarthritis flare given arthropathy seen on MRI. Aspiration was indeterminate. RF slightly elevated at 17 and CCP is still pending. Seems to be back to baseline with no swelling on physical exam today, right wrist ROM continues to improve.   #HTN  Mostly  hypertensive throughout admission. Previously on metoprolol 25 mg daily, increased to metoprolol 50 mg BID, added losartan 50 mg daily on 9/27 as well as amlodipine 5 mg daily.   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 She was tachycardic (110's-120's) and hypertensive (180's/90's-100's) during admission. TSH <0.01 and free T4 >5.5. She had a prescription for methimazole that was never filled. Started methimazole 10mg  three times daily, increased metoprolol 50mg  twice daily and started Losartan 50mg  daily. She requires outpatient follow-up with an endocrinologist.     #Anemia  Hemoglobin low, but increasing, 8.4 (9/27), 7.7 (9/26). Iron 8, TIBC 192, %saturation 4, ferritin 337, reticulocyte % 4.3. She denied shortness of breath or lightheadedness. No overt signs of bleeding. Labs are consistent with an acute phase anemia, in the context of recent urosepsis.   #Hx of Atrial Fibrillation  Patient has a history of atrial fibrillation. Home medication is Eliquis 5mg  twice daily which was started during hospitalization.   #Hypokalemia  Hypomagnesemia  On admission patient had a potassium of 2.5 without EKG changes which was repleted with K. K 3.3 (9/26). She requied oral potassium twice daily for 2 days. Magnesium 1.4 (9/25), repleted with 2g Mg, now 1.9 (9/26). She had fluctuations in her potassium during treatment of euglycemic DKA. On discharge, electrolyte abnormalities were resolved.    #Thrombocytosis Patients platelets elevated on admission 657 (9/25), peaked 744 (9/27) and decreased to 591 (9/30).. Smear revealed normal platelet morphology with increased platelets. Likely etiology is due to an inflammatory reaction. Discharge Subjective: Feeling well. No acute concerns.   Discharge Exam:  BP (!) 145/74 (BP Location: Left Arm)   Pulse 92   Temp 97.8 F (36.6 C)  (Oral)   Resp (!) 22   Ht 5\' 1"  (1.549 m)   Wt 65.3 kg   SpO2 94%   BMI 27.20 kg/m  Physical Exam: Constitutional: Patient resting comfortably in bed in no acute distress. She is cooperative and is using complete sentences to ask and answer questions.  Cardiovascular: RRR, no r/m/g, no lower leg edema. Pulmonary/chest: Clear lungs bilaterally, normal respiratory effort on room air.  Abdominal: Soft, non-tender, non-distended.  MSK: No swelling observed in right hand. Improved range of motion, including flexion and extension, of right wrist with minimal discomfort compared to previous physical exams.  Skin: Warm and dry.  Psych: Normal mood and affect.   Pertinent Labs, Studies, and Procedures:  DG FL ASP/INJ INTER (WRIST, ELBOW,  ANKLE)  Result Date: 03/08/2023 INDICATION: Right-sided wrist pain with MRI demonstrating right wrist joint effusions. Please perform image guided aspiration for diagnostic purposes. EXAM: DG ASPIRATION/INJ MEDIUM JOINT COMPARISON:  None IMPRESSION: Technically successful fluoroscopic guided lavage aspiration of the right wrist yielding less than 1 cc of serous appearing joint fluid. Electronically Signed   By: Simonne Come M.D.   On: 03/08/2023 15:26   MR WRIST RIGHT W WO CONTRAST  Result Date: 03/07/2023 CLINICAL DATA:  Wrist pain, chronic, inflammatory arthritis suspected. ER patient with right wrist pain and swelling since yesterday. Clinical concern for septic arthritis. No recent injury or fever. EXAM: MR OF THE RIGHT WRIST WITHOUT AND WITH CONTRAST TECHNIQUE:  IMPRESSION: 1. Large radiocarpal, midcarpal and distal radioulnar joint effusions with diffuse synovial enhancement following contrast consistent with synovitis. 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 Joint aspiration recommended. 2. No evidence of osteomyelitis or periarticular abscess. Probable cellulitis. 3. Multifocal  arthropathy throughout the wrist, most advanced adjacent to the 1st carpometacarpal and scaphotrapeziotrapezoidal joints. 4. Nonspecific edema and enhancement within the visualized hand musculature, especially within the thenar eminence. Electronically Signed   By: Carey Bullocks M.D.   On: 03/07/2023 19:22   DG Wrist Complete Right  Result Date: 03/07/2023 CLINICAL DATA:  Wrist pain and swelling EXAM: RIGHT WRIST - COMPLETE 3+ VIEW COMPARISON:  None Available. FINDINGS: No evidence of acute fracture. No joint malalignment. 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 IMPRESSION: 1. No acute fracture or joint malalignment. 2. Moderate triscaphe and radiocarpal degenerative change with Electronically Signed   By: Feliberto Harts M.D.   On: 03/07/2023 12:26     Discharge Instructions: Discharge Instructions     Call MD for:  difficulty breathing, headache or visual disturbances   Complete by: As directed    Call MD for:  extreme fatigue   Complete by: As directed    Call MD for:  hives   Complete by: As directed    Call MD for:  persistant dizziness or light-headedness   Complete by: As directed    Call MD for:  persistant nausea and vomiting   Complete by: As directed    Call MD for:  redness, tenderness, or signs of infection (pain, swelling, redness, odor or green/yellow discharge around incision site)   Complete by: As directed    Call MD for:  severe uncontrolled pain   Complete by: As directed    Call MD for:  temperature >100.4   Complete by: As directed    Diet Carb Modified   Complete by: As directed    Discharge instructions   Complete by: As directed    Miguel Dibble,  You were recently admitted to [facility] for swelling in your right wrist.   Continue taking your home medications with the following changes  1. Start taking  a. Methimazole 10mg  three times daily   b. Losartan 50mg  daily   c. Metoprolol 50mg  twice daily    d. Metformin ER 500mg  twice daily   e. Ferrous sulfate 325mg  daily  2. Continue taking  a. Apixaban (Eliquis) 5mg  twice daily    Please schedule a follow-up appointment with your endocrinologist, Dr. Marquis Lunch at [facility]: Marquis Lunch, MD [facility] [address] [place], [place] [address] Phone: [phone]  Please schedule a follow-up appointment with [facility] if the swelling in your right wrist  does not improve within 1 week: Shaune Pollack, MD [facility] [address]  [place], [place] [address] Phone: [phone]  Be on the lookout for a call from home health physical therapy to schedule at home therapy.   You should seek further medical care if you have a headache that does not go away with Tylenol, experience blurry vision, swelling in your wrist is warm, red and painful to move, shortness of breath or dizziness with standing.  We recommend that you see your primary care doctor in about a week to make sure that you continue to improve. We are so glad that you are feeling better.  Sincerely, Dr. Justin Mend, Kristie Cowman (medical student) and Internal medicine team   Increase activity slowly   Complete by: As directed        Signed: Gwenevere Abbot, MD Eligha Bridegroom. [facility] Internal Medicine Residency, PGY-2 03/12/2023, 3:41 PM

## 2023-03-12 NOTE — TOC Initial Note (Signed)
Transition of Care (TOC) - Initial/Assessment Note   Spoke to patient at bedside. From home, grandson is there at night. Patient has a shower chair at home .   Patient is active with a home health agency but unsure the name, believes it may be [facility]. Called Lynette with [facility], patient is not active with them.   Called Kelly with [facility] patient is active with them for HHPT/OT . Will need resumption of care orders . Entered and asked MD to sign  Patient Details  Name: Samantha [name] MRN: [id number] Date of Birth: 10-24-1945  Transition of Care [facility]) CM/SW Contact:    Kingsley Plan, RN Phone Number: 03/12/2023, 11:16 AM  Clinical Narrative:                   Expected Discharge Plan: Home w Home Health Services Barriers to Discharge: Continued Medical Work up   Patient Goals and CMS Choice Patient states their goals for this hospitalization and ongoing recovery are:: to return to home CMS Medicare.gov Compare Post Acute Care list provided to:: Patient Choice offered to / list presented to : Patient      Expected Discharge Plan and Services   Discharge Planning Services: CM Consult Post Acute Care Choice: Home Health                   DME Arranged: N/A         HH Arranged: PT, OT HH Agency: [facility] Date [facility] Agency Contacted: 03/12/23 Time HH Agency Contacted: 1115 Representative spoke with at [facility] Agency: Tresa Endo  Prior Living Arrangements/Services   Lives with:: Self (grandson is there at night) Patient language and need for interpreter reviewed:: Yes Do you feel safe going back to the place where you live?: Yes      Need for Family Participation in Patient Care: Yes (Comment) Care giver support system in place?: Yes (comment) Current home services: DME Criminal Activity/Legal Involvement Pertinent to Current Situation/Hospitalization: No - Comment as needed  Activities of Daily Living   ADL Screening (condition at time of admission) Does  the patient have a NEW difficulty with bathing/dressing/toileting/self-feeding that is expected to last >3 days?: No Does the patient have a NEW difficulty with getting in/out of bed, walking, or climbing stairs that is expected to last >3 days?: No Does the patient have a NEW difficulty with communication that is expected to last >3 days?: No Is the patient deaf or have difficulty hearing?: No Does the patient have difficulty seeing, even when wearing glasses/contacts?: No Does the patient have difficulty concentrating, remembering, or making decisions?: No  Permission Sought/Granted   Permission granted to share information with : Yes, Verbal Permission Granted     Permission granted to share info w AGENCY: home health        Emotional Assessment Appearance:: Appears stated age Attitude/Demeanor/Rapport: Engaged Affect (typically observed): Accepting Orientation: : Oriented to Self, Oriented to Place, Oriented to  Time, Oriented to Situation Alcohol / Substance Use: Not Applicable Psych Involvement: No (comment)  Admission diagnosis:  Hyperthyroidism [E05.90] Right wrist pain [M25.531] Septic joint of right wrist [facility]) [M00.9] Patient Active Problem List   Diagnosis Date Noted   Right wrist pain 03/10/2023   Increased anion gap metabolic acidosis 03/10/2023   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   Septic joint of right wrist (HCC) 03/07/2023   SVT (supraventricular tachycardia) 01/26/2023   Paroxysmal atrial fibrillation (HCC) 01/25/2023   Malnutrition of moderate degree  01/24/2023   Sepsis secondary to UTI (HCC) 01/23/2023   Vaginal dryness 11/16/2022   Normal breast exam 11/16/2022   Vaginal discharge 11/16/2022   Vulvar itching 11/16/2022   Screening examination for STD (sexually transmitted disease) 11/16/2022   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   Type 2 diabetes mellitus without complication, without long-term current use of insulin (HCC) 10/06/2022    Hyperthyroidism 10/05/2022   Pain in joint of right knee 06/20/2022   KNEE, ARTHRITIS, DEGEN./OSTEO 12/16/2007   DERANGEMENT MENISCUS 12/16/2007   HLD (hyperlipidemia) 12/18/2006   HYPERLIPIDEMIA 05/07/2006   OBESITY NOS 05/07/2006   ANEMIA-NOS 05/07/2006   CARPAL TUNNEL SYNDROME 05/07/2006   Essential hypertension, benign 05/07/2006   PCP:  Hillery Aldo, NP Pharmacy:   [facility] [phone] - [place], [place] - [address] AT [facility] [address] [place] [address] Phone: [phone] Fax: [phone]  [facility] [address] [place] [address] Phone: [phone] Fax: [phone]     Social Determinants of Health (SDOH) Social History: SDOH Screenings   Food Insecurity: No Food Insecurity (03/11/2023)  Housing: Patient Declined (03/11/2023)  Transportation Needs: No Transportation Needs (03/11/2023)  Utilities: Not At Risk (03/11/2023)  Alcohol Screen: Low Risk  (11/16/2022)  Depression (PHQ2-9): Medium Risk (11/16/2022)  Financial Resource Strain: Low Risk  (11/16/2022)  Physical Activity: Inactive (11/16/2022)  Social Connections: Moderately Integrated (11/16/2022)  Stress: No Stress Concern Present (11/16/2022)  Tobacco Use: Low Risk  (03/07/2023)   SDOH Interventions:     Readmission Risk Interventions     No data to display

## 2023-03-13 LAB — ANAEROBIC CULTURE W GRAM STAIN

## 2023-03-28 ENCOUNTER — Ambulatory Visit (INDEPENDENT_AMBULATORY_CARE_PROVIDER_SITE_OTHER): Payer: Medicare HMO | Admitting: "Endocrinology

## 2023-03-28 ENCOUNTER — Encounter: Payer: Self-pay | Admitting: "Endocrinology

## 2023-03-28 VITALS — BP 132/66 | HR 96 | Ht 61.0 in | Wt 138.2 lb

## 2023-03-28 DIAGNOSIS — E0590 Thyrotoxicosis, unspecified without thyrotoxic crisis or storm: Secondary | ICD-10-CM

## 2023-03-28 NOTE — Progress Notes (Signed)
03/28/2023, 2:44 PM   Endocrinology follow-up note  Subjective:    Patient ID: Samantha [name], female    DOB: January 09, 1946, PCP Hillery Aldo, NP   Past Medical History:  Diagnosis Date   Allergic rhinitis    Arthritis of both knees    Arthritis of both shoulder regions    Bilateral carpal tunnel syndrome    Cataract    Chronic low back pain    CKD (chronic kidney disease), stage III (HCC)    Decreased vision    Diabetes mellitus    HTN (hypertension)    Hyperlipidemia    Morbid obesity (HCC)    Thyroid disease    Past Surgical History:  Procedure Laterality Date   CATARACT EXTRACTION     COLONOSCOPY     COLONOSCOPY N/A 09/15/2020   Procedure: COLONOSCOPY;  Surgeon: Corbin Ade, MD;  [facility];  Service: Endoscopy;  Laterality: N/A;  ASA II / PM procedure   POLYPECTOMY  09/15/2020   Procedure: POLYPECTOMY;  Surgeon: Corbin Ade, MD;  [facility];  Service: Endoscopy;;   TUBAL LIGATION     Social History   Socioeconomic History   Marital status: Widowed    Spouse name: Not on file   Number of children: Not on file   Years of education: Not on file   Highest education level: Not on file  Occupational History   Not on file  Tobacco Use   Smoking status: Never   Smokeless tobacco: Never  Vaping Use   Vaping status: Never Used  Substance and Sexual Activity   Alcohol use: Yes    Comment: occasionally   Drug use: No   Sexual activity: Yes    Birth control/protection: Surgical    Comment: tubal  Other Topics Concern   Not on file  Social History Narrative   Not on file   Social Determinants of Health   Financial Resource Strain: Low Risk  (11/16/2022)   Overall Financial Resource Strain (CARDIA)    Difficulty of Paying Living Expenses: Not very hard  Food Insecurity: No Food Insecurity (03/11/2023)   Hunger Vital Sign    Worried About Running Out of Food in the Last Year: Never  true    Ran Out of Food in the Last Year: Never true  Transportation Needs: No Transportation Needs (03/11/2023)   PRAPARE - Administrator, Civil Service (Medical): No    Lack of Transportation (Non-Medical): No  Physical Activity: Inactive (11/16/2022)   Exercise Vital Sign    Days of Exercise per Week: 0 days    Minutes of Exercise per Session: 0 min  Stress: No Stress Concern Present (11/16/2022)   [employer] of Occupational Health - Occupational Stress Questionnaire    Feeling of Stress : Only a little  Social Connections: Moderately Integrated (11/16/2022)   Social Connection and Isolation Panel [NHANES]    Frequency of Communication with Friends and Family: Three times a week    Frequency of Social Gatherings with Friends and Family: Once a week    Attends Religious Services: More than 4 times per year    Active Member of Clubs or Organizations: Yes  Attends Banker Meetings: 1 to 4 times per year    Marital Status: Widowed   Family History  Problem Relation Age of Onset   Hypertension Father    Hypertension Mother    Diabetes Mother    Breast cancer Mother    Hypertension Sister    Cancer Sister        pancreatic   Cancer Sister        pancreatic   Liver disease Sister    Outpatient Encounter Medications as of 03/28/2023  Medication Sig   acetaminophen (TYLENOL) 500 MG tablet Take 500-1,000 mg by mouth every 6 (six) hours as needed for mild pain.   amLODipine (NORVASC) 5 MG tablet Take 1 tablet (5 mg total) by mouth daily.   apixaban (ELIQUIS) 5 MG TABS tablet Take 1 tablet (5 mg total) by mouth 2 (two) times daily.   diclofenac Sodium (VOLTAREN) 1 % GEL Apply 4 g topically 4 (four) times daily.   feeding supplement (ENSURE ENLIVE / ENSURE PLUS) LIQD Take 237 mLs by mouth 2 (two) times daily between meals. (Patient not taking: Reported on 03/07/2023)   ferrous sulfate 325 (65 FE) MG tablet Take 1 tablet (325 mg total) by mouth daily with  breakfast.   losartan (COZAAR) 50 MG tablet Take 1 tablet (50 mg total) by mouth daily.   melatonin 3 MG TABS tablet Take 1 tablet (3 mg total) by mouth at bedtime as needed (insomnia).   metFORMIN (GLUCOPHAGE-XR) 500 MG 24 hr tablet Take 1 tablet (500 mg total) by mouth 2 (two) times daily with a meal.   methimazole (TAPAZOLE) 10 MG tablet Take 1 tablet (10 mg total) by mouth 3 (three) times daily.   metoprolol tartrate (LOPRESSOR) 50 MG tablet Take 1 tablet (50 mg total) by mouth 2 (two) times daily.   No facility-administered encounter medications on file as of 03/28/2023.   ALLERGIES: Allergies  Allergen Reactions   Jardiance [Empagliflozin] Other (See Comments)    Euglycemic DKA    VACCINATION STATUS: Immunization History  Administered Date(s) Administered   H1N1 06/30/2008   Influenza Whole 03/12/2006, 06/03/2007   Moderna Sars-Covid-2 Vaccination 07/20/2019, 08/20/2019   Pneumococcal Polysaccharide-23 01/22/2007    HPI Samantha [name] is 77 y.o. female who presents today with a medical history as above. she is being seen in follow-up after she was seen in consultation for hyperthyroidism requested by Hillery Aldo, NP.  -Patient was accompanied by her daughter to clinic. She was diagnosed with primary hypothyroidism.  On thyroid uptake and scan in May 2024.  After reviewing her options, she was treated with radioactive iodine treatment on November 28, 2022.  Her subsequent thyroid function tests did not confirm treatment response.  She was never initiated on thyroid hormone supplement due to the fact that she did not develop hypothyroidism. During her recent hospitalization for unrelated condition, she was found to have persistently elevated thyroid hormone and undetectable TSH.    -She was started on methimazole 10 mg p.o. 3 times daily which she remains with subtle clinical response.   She did not have repeat labs since then. As it was the case even prior to her first treatment, she  denies symptoms specifically denying palpitations, tremors, heat intolerance.  She denies dysphagia, shortness of breath, nor voice change.   She denies family history of thyroid problems.   She does not have time family history of thyroid malignancy.  She presents with 5 more pounds of weight loss since her  last visit.    Her other medical problems include hypertension on treatment with amlodipine and lisinopril.  She remains on metformin 500 mg p.o. twice daily for type 2 diabetes  with recent A1c of 7.1%.   Review of Systems  Constitutional: +mildly fluctuating body weight with recent weight  loss, no fatigue, no subjective hyperthermia, no subjective hypothermia Eyes: no blurry vision, no xerophthalmia  Neurological: no tremors, no numbness, no tingling, no dizziness Psychiatric: no depression, + anxiety  Objective:       03/28/2023    1:22 PM 03/12/2023   11:14 AM 03/12/2023   10:14 AM  Vitals with BMI  Height 5\' 1"     Weight 138 lbs 3 oz    BMI 26.13    Systolic 132 145 161  Diastolic 66 74 85  Pulse 96 92 111    BP 132/66   Pulse 96   Ht 5\' 1"  (1.549 m)   Wt 138 lb 3.2 oz (62.7 kg)   BMI 26.11 kg/m   Wt Readings from Last 3 Encounters:  03/28/23 138 lb 3.2 oz (62.7 kg)  03/12/23 143 lb 15.4 oz (65.3 kg)  02/01/23 144 lb 13.5 oz (65.7 kg)    Physical Exam  Constitutional:  Body mass index is 26.11 kg/m.,  not in acute distress, + anxious state of mind.  She ambulates with a walker due to disequilibrium. Eyes: PERRLA, EOMI, no exophthalmos    CMP ( most recent) CMP     Component Value Date/Time   NA 140 03/12/2023 0306   K 3.2 (L) 03/12/2023 0306   CL 109 03/12/2023 0306   CO2 22 03/12/2023 0306   GLUCOSE 90 03/12/2023 0306   BUN 11 03/12/2023 0306   CREATININE 1.00 03/12/2023 0306   CALCIUM 8.6 (L) 03/12/2023 0306   PROT 6.8 03/07/2023 1103   ALBUMIN 2.4 (L) 03/07/2023 1103   AST 12 (L) 03/07/2023 1103   ALT 9 03/07/2023 1103   ALKPHOS 76  03/07/2023 1103   BILITOT 1.2 03/07/2023 1103   GFRNONAA 58 (L) 03/12/2023 0306   GFRAA 55 (L) 05/08/2018 2247     Diabetic Labs (most recent): Lab Results  Component Value Date   HGBA1C 7.1 (H) 03/08/2023   HGBA1C 9.8 (H) 01/23/2023   HGBA1C 8.7 (A) 01/09/2023   MICROALBUR 1.54 02/09/2009   MICROALBUR 0.64 01/16/2008   MICROALBUR 1.69 12/03/2006     Lipid Panel ( most recent) Lipid Panel     Component Value Date/Time   CHOL 166 02/09/2009 2026   TRIG 86 02/09/2009 2026   HDL 60 02/09/2009 2026   CHOLHDL 2.8 Ratio 02/09/2009 2026   VLDL 17 02/09/2009 2026   LDLCALC 89 02/09/2009 2026      Lab Results  Component Value Date   TSH <0.010 (L) 03/07/2023   TSH 0.028 (L) 01/23/2023   TSH <0.010 (L) 01/04/2023   TSH <0.01 (L) 10/06/2022   TSH 1.527 02/09/2009   FREET4 >5.50 (H) 03/07/2023   FREET4 4.47 (H) 01/23/2023   FREET4 >5.50 (H) 01/04/2023   FREET4 2.6 (H) 10/06/2022     Thyroid uptake and scan on Oct 20, 2022 FINDINGS: Unremarkable LEFT thyroid lobe.   Enlarged RIGHT thyroid lobe containing warm and cold nodules.   Patient has previously undergone biopsy of an indeterminate nodule in the RIGHT lobe.   4 hour I-123 uptake = 29.9% (normal 5-20%), 24 hour I-123 uptake = 59.4% (normal 10-30%)   IMPRESSION: Enlarged multinodular thyroid gland especially RIGHT lobe. Elevated  4 hour and 24 hour radio iodine uptakes consistent with hyperthyroidism.  Nuclear medicine radioactive iodine therapy for hyperthyroidism on November 28, 2022 IMPRESSION: Per oral administration of I-131 sodium iodide for the treatment of hyperthyroidism.    Assessment & Plan:   1. Hyperthyroidism 2.  Toxic multinodular goiter 3.  Type 2 diabetes  -After appropriate workup showed primary hyperthyroidism as a result of toxic multinodular goiter, she was treated with radioactive iodine treatment on November 28, 2022.  Her subsequent thyroid function tests were consistent with likely  treatment failure.  She was initiated on methimazole as a breathing treatment during her recent hospitalization.    She will be sent for repeat labs for TSH, free T4 and free T3.  If her labs are showing controlled thyroid, she will be considered for tapering off of methimazole in preparation for thyroid uptake and scan and thyroid patient for repeat treatment with radioactive iodine treatment. She is aware of the fact that the radioactive iodine treatment was expected to cause hypothyroidism and still if repeated the outcome would be hypothyroidism which will need lifelong replacement with thyroid hormone. -She is also on metoprolol 50 mg p.o. twice daily, advised to continue. -She will return in 6 weeks for reevaluation.    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  -She is advised to follow-up with her PMD for diabetes care, currently on metformin 500 mg p.o. twice daily with recent A1c of 7.1%  - she is advised to maintain close follow up with Hillery Aldo, NP for primary care needs.   I spent  22  minutes in the care of the patient today including review of labs from Thyroid Function, CMP, and other relevant labs ; imaging/biopsy records (current and previous including abstractions from other facilities); face-to-face time discussing  her lab results and symptoms, medications doses, her options of short and long term treatment based on the latest standards of care / guidelines;   and documenting the encounter.  Samantha [name]  participated in the discussions, expressed understanding, and voiced agreement with the above plans.  All questions were answered to her satisfaction. she is encouraged to contact clinic should she have any questions or concerns prior to her return visit.   Follow up plan: Return in about 6 weeks (around 05/09/2023) for Labs Today- Non-Fasting Ok.   Marquis Lunch, MD [facility] [address] [place], [place] [address] Phone: [phone]  Fax: [phone]     03/28/2023, 2:44 PM  This note was partially dictated with voice recognition software. Similar sounding words can be transcribed inadequately or may not  be corrected upon review.

## 2023-03-29 ENCOUNTER — Telehealth: Payer: Self-pay

## 2023-03-29 LAB — CBC WITH DIFFERENTIAL/PLATELET
Basophils Absolute: 0 10*3/uL (ref 0.0–0.2)
Basos: 0 %
EOS (ABSOLUTE): 0.1 10*3/uL (ref 0.0–0.4)
Eos: 1 %
Hematocrit: 28.9 % — ABNORMAL LOW (ref 34.0–46.6)
Hemoglobin: 8.5 g/dL — ABNORMAL LOW (ref 11.1–15.9)
Immature Grans (Abs): 0 10*3/uL (ref 0.0–0.1)
Immature Granulocytes: 0 %
Lymphocytes Absolute: 2.3 10*3/uL (ref 0.7–3.1)
Lymphs: 30 %
MCH: 25.1 pg — ABNORMAL LOW (ref 26.6–33.0)
MCHC: 29.4 g/dL — ABNORMAL LOW (ref 31.5–35.7)
MCV: 85 fL (ref 79–97)
Monocytes Absolute: 0.5 10*3/uL (ref 0.1–0.9)
Monocytes: 6 %
Neutrophils Absolute: 4.6 10*3/uL (ref 1.4–7.0)
Neutrophils: 63 %
Platelets: 561 10*3/uL — ABNORMAL HIGH (ref 150–450)
RBC: 3.39 x10E6/uL — ABNORMAL LOW (ref 3.77–5.28)
RDW: 17.3 % — ABNORMAL HIGH (ref 11.7–15.4)
WBC: 7.5 10*3/uL (ref 3.4–10.8)

## 2023-03-29 LAB — COMPREHENSIVE METABOLIC PANEL
ALT: 5 [IU]/L (ref 0–32)
AST: 13 [IU]/L (ref 0–40)
Albumin: 3.4 g/dL — ABNORMAL LOW (ref 3.8–4.8)
Alkaline Phosphatase: 96 [IU]/L (ref 44–121)
BUN/Creatinine Ratio: 7 — ABNORMAL LOW (ref 12–28)
BUN: 7 mg/dL — ABNORMAL LOW (ref 8–27)
Bilirubin Total: 0.3 mg/dL (ref 0.0–1.2)
CO2: 22 mmol/L (ref 20–29)
Calcium: 9.7 mg/dL (ref 8.7–10.3)
Chloride: 100 mmol/L (ref 96–106)
Creatinine, Ser: 0.99 mg/dL (ref 0.57–1.00)
Globulin, Total: 3.1 g/dL (ref 1.5–4.5)
Glucose: 227 mg/dL — ABNORMAL HIGH (ref 70–99)
Potassium: 3.6 mmol/L (ref 3.5–5.2)
Sodium: 140 mmol/L (ref 134–144)
Total Protein: 6.5 g/dL (ref 6.0–8.5)
eGFR: 59 mL/min/{1.73_m2} — ABNORMAL LOW (ref >59)

## 2023-03-29 LAB — TSH: TSH: <0.005 u[IU]/mL — ABNORMAL LOW (ref 0.450–4.500)

## 2023-03-29 LAB — T3, FREE: T3, Free: 3.7 pg/mL (ref 2.0–4.4)

## 2023-03-29 LAB — T4, FREE: Free T4: 1.94 ng/dL — ABNORMAL HIGH (ref 0.82–1.77)

## 2023-03-29 NOTE — Telephone Encounter (Signed)
Spoke with pt's daughter [name]) advising pt needs to decrease her methimazole to 10 mg daily and repeat lab work prior to her next appointment per Dr.Nida's orders. Understanding voiced.

## 2023-03-29 NOTE — Telephone Encounter (Signed)
Tried to call pt but did not receive an answer and was unable to leave a message.

## 2023-03-29 NOTE — Telephone Encounter (Signed)
-----   Message from Marquis Lunch sent at 03/29/2023  9:18 AM EDT ----- Ander Slade, Would you please call and advise this patient to lower her methimazole to 10 mg once a day and repeat labs before her next visit? TSH, Free t4, free t3. Thanks. ----- Message ----- From: Nell Range Lab Results In Sent: 03/29/2023   5:37 AM EDT To: Roma Kayser, MD

## 2023-04-18 NOTE — Progress Notes (Signed)
CARDIOLOGY CONSULT NOTE       Patient ID: Samantha [name] MRN: [id number] DOB/AGE: [birth year] 77 y.o.  Primary Physician: Hillery Aldo, NP Primary Cardiologist: Eden Emms    HPI:  77 y.o. referred by Dr Allena Katz for CAD risk. First seen by me in 2021 I have not seen since Followed by NP/PA History of DM, HTN, HLD and obesity Has CRF with Cr around 1.8 ml/dl.  She has no documented vascular disease. She is somewhat sedentary but walks with some tightness in chest which may be related to dyspnea. Tightness is exertional. 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 She has 2 older children and and grandson that lives with her She is widowed over last 12 years. She does not smoke No history of TIA, claudication. Baseline Cr is 1.8 from HTN renal disease   Hyperthyroid on methimazole and lopressor 50 mg bid She had iodine ablation June 2024  But not compliant with methimazole or beta blocker  03/28/23 TSH < .005 and T4 elevated at 1.94 Admitted 03/12/23 with Had some PAF in this setting on eliquis CHADVASC 5  Right hand arthritis ? Pseudogout with joint effusion and arthritis on MRI  RF positive 17.3   Myovue done 05/27/20 normal EF 74%  TTE 01/24/23 with hyperdynamic EF 75% chordal SAM some LVOT turbulence no MR  She still has not arranged repeat Iodine ablation   ROS All other systems reviewed and negative except as noted above  Past Medical History:  Diagnosis Date   Allergic rhinitis    Arthritis of both knees    Arthritis of both shoulder regions    Bilateral carpal tunnel syndrome    Cataract    Chronic low back pain    CKD (chronic kidney disease), stage III (HCC)    Decreased vision    Diabetes mellitus    HTN (hypertension)    Hyperlipidemia    Morbid obesity (HCC)    Thyroid disease     Family History  Problem Relation Age of Onset   Hypertension Father    Hypertension Mother    Diabetes Mother    Breast cancer Mother    Hypertension Sister     Cancer Sister        pancreatic   Cancer Sister        pancreatic   Liver disease Sister     Social History   Socioeconomic History   Marital status: Widowed    Spouse name: Not on file   Number of children: Not on file   Years of education: Not on file   Highest education level: Not on file  Occupational History   Not on file  Tobacco Use   Smoking status: Never   Smokeless tobacco: Never  Vaping Use   Vaping status: Never Used  Substance and Sexual Activity   Alcohol use: Yes    Comment: occasionally   Drug use: No   Sexual activity: Yes    Birth control/protection: Surgical    Comment: tubal  Other Topics Concern   Not on file  Social History Narrative   Not on file   Social Determinants of Health   Financial Resource Strain: Low Risk  (11/16/2022)   Overall Financial Resource Strain (CARDIA)    Difficulty of Paying Living Expenses: Not very hard  Food Insecurity: No Food Insecurity (03/11/2023)   Hunger Vital Sign    Worried About Running Out of Food in the Last Year: Never true  Ran Out of Food in the Last Year: Never true  Transportation Needs: No Transportation Needs (03/11/2023)   PRAPARE - Administrator, Civil Service (Medical): No    Lack of Transportation (Non-Medical): No  Physical Activity: Inactive (11/16/2022)   Exercise Vital Sign    Days of Exercise per Week: 0 days    Minutes of Exercise per Session: 0 min  Stress: No Stress Concern Present (11/16/2022)   [employer] of Occupational Health - Occupational Stress Questionnaire    Feeling of Stress : Only a little  Social Connections: Moderately Integrated (11/16/2022)   Social Connection and Isolation Panel [NHANES]    Frequency of Communication with Friends and Family: Three times a week    Frequency of Social Gatherings with Friends and Family: Once a week    Attends Religious Services: More than 4 times per year    Active Member of [employer] or Organizations: Yes    Attends Tax inspector Meetings: 1 to 4 times per year    Marital Status: Widowed  Intimate Partner Violence: Not At Risk (03/11/2023)   Humiliation, Afraid, Rape, and Kick questionnaire    Fear of Current or Ex-Partner: No    Emotionally Abused: No    Physically Abused: No    Sexually Abused: No    Past Surgical History:  Procedure Laterality Date   CATARACT EXTRACTION     COLONOSCOPY     COLONOSCOPY N/A 09/15/2020   Procedure: COLONOSCOPY;  Surgeon: Corbin Ade, MD;  [facility];  Service: Endoscopy;  Laterality: N/A;  ASA II / PM procedure   POLYPECTOMY  09/15/2020   Procedure: POLYPECTOMY;  Surgeon: Corbin Ade, MD;  [facility];  Service: Endoscopy;;   TUBAL LIGATION        Current Outpatient Medications:    acetaminophen (TYLENOL) 500 MG tablet, Take 500-1,000 mg by mouth every 6 (six) hours as needed for mild pain., Disp: , Rfl:    amLODipine (NORVASC) 10 MG tablet, Take 10 mg by mouth daily., Disp: , Rfl:    apixaban (ELIQUIS) 5 MG TABS tablet, Take 1 tablet (5 mg total) by mouth 2 (two) times daily., Disp: 60 tablet, Rfl: 1   diclofenac Sodium (VOLTAREN) 1 % GEL, Apply 4 g topically 4 (four) times daily., Disp: 400 g, Rfl: 0   ferrous sulfate 325 (65 FE) MG tablet, Take 1 tablet (325 mg total) by mouth daily with breakfast., Disp: 30 tablet, Rfl: 0   losartan (COZAAR) 50 MG tablet, Take 1 tablet (50 mg total) by mouth daily., Disp: 30 tablet, Rfl: 0   melatonin 3 MG TABS tablet, Take 1 tablet (3 mg total) by mouth at bedtime as needed (insomnia)., Disp: , Rfl:    metFORMIN (GLUCOPHAGE-XR) 500 MG 24 hr tablet, Take 1 tablet (500 mg total) by mouth 2 (two) times daily with a meal., Disp: 60 tablet, Rfl: 0   methimazole (TAPAZOLE) 10 MG tablet, Take 1 tablet (10 mg total) by mouth 3 (three) times daily., Disp: 90 tablet, Rfl: 0   metoprolol tartrate (LOPRESSOR) 50 MG tablet, Take 1 tablet (50 mg total) by mouth 2 (two) times daily., Disp: , Rfl:     Physical  Exam: Blood pressure (!) 142/80, pulse 62, height 5\' 1"  (1.549 m), weight 144 lb 9.6 oz (65.6 kg), SpO2 99%.    Affect appropriate Obese black female  Poor vision  Lungs clear with no wheezing and good diaphragmatic motion Heart:  S1/S2  no murmur, no rub, gallop or click PMI normal Abdomen: benighn, BS positve, no tenderness, no AAA no bruit.  No HSM or HJR Distal pulses intact with no bruits No edema Neuro non-focal Skin warm and dry No muscular weakness  Labs:   Lab Results  Component Value Date   WBC 7.5 03/28/2023   HGB 8.5 (L) 03/28/2023   HCT 28.9 (L) 03/28/2023   MCV 85 03/28/2023   PLT 561 (H) 03/28/2023   No results for input(s): "NA", "K", "CL", "CO2", "BUN", "CREATININE", "CALCIUM", "PROT", "BILITOT", "ALKPHOS", "ALT", "AST", "GLUCOSE" in the last 168 hours.  Invalid input(s): "LABALBU" Lab Results  Component Value Date   TROPONINI <0.03 09/06/2015    Lab Results  Component Value Date   CHOL 166 02/09/2009   CHOL 196 09/29/2008   CHOL 185 01/16/2008   Lab Results  Component Value Date   HDL 60 02/09/2009   HDL 74 09/29/2008   HDL 75 01/16/2008   Lab Results  Component Value Date   LDLCALC 89 02/09/2009   LDLCALC 100 (H) 09/29/2008   LDLCALC 100 (H) 01/16/2008   Lab Results  Component Value Date   TRIG 86 02/09/2009   TRIG 111 09/29/2008   TRIG 52 01/16/2008   Lab Results  Component Value Date   CHOLHDL 2.8 Ratio 02/09/2009   CHOLHDL 2.6 Ratio 09/29/2008   CHOLHDL 2.5 Ratio 01/16/2008   No results found for: "LDLDIRECT"    Radiology: No results found.  EKG: SST rate 113 anterolateral T wave changes    ASSESSMENT AND PLAN:   1. HTN:  Well controlled.  Continue current medications and low sodium Dash type diet.   2. DM:  Discussed low carb diet.  Target hemoglobin A1c is 6.5 or less.  Continue current medications. 3. Thyroid:  f/u endocrine multi nodular goiter post Iodine ablation on methimazole and lopressor TSH still quite  suppressed f/u endocrine Dr Fransico Him  4. CKD:  Baseline CR 1.8 f/u primary  5. CAD risk normal myovue 05/27/20  6. Arthritis:  right wrist positive RF f/u rheumatology  7. PAF:  in setting of hyperthyroidism continue eliquis and lopressor      F/U in 6 months    Signed: Charlton Haws 04/23/2023, 2:15 PM

## 2023-04-23 ENCOUNTER — Encounter: Payer: Self-pay | Admitting: Cardiovascular Disease

## 2023-04-23 ENCOUNTER — Ambulatory Visit: Payer: Medicare HMO | Attending: Cardiovascular Disease | Admitting: Cardiovascular Disease

## 2023-04-23 VITALS — BP 140/86 | HR 62 | Ht 61.0 in | Wt 144.6 lb

## 2023-04-23 DIAGNOSIS — E782 Mixed hyperlipidemia: Secondary | ICD-10-CM

## 2023-04-23 DIAGNOSIS — E0590 Thyrotoxicosis, unspecified without thyrotoxic crisis or storm: Secondary | ICD-10-CM

## 2023-04-23 DIAGNOSIS — R002 Palpitations: Secondary | ICD-10-CM

## 2023-04-23 DIAGNOSIS — I10 Essential (primary) hypertension: Secondary | ICD-10-CM

## 2023-04-23 DIAGNOSIS — I480 Paroxysmal atrial fibrillation: Secondary | ICD-10-CM

## 2023-04-23 NOTE — Patient Instructions (Signed)
Medication Instructions:  Your physician recommends that you continue on your current medications as directed. Please refer to the Current Medication list given to you today.  *If you need a refill on your cardiac medications before your next appointment, please call your pharmacy*   Lab Work: NONE   If you have labs (blood work) drawn today and your tests are completely normal, you will receive your results only by: MyChart Message (if you have MyChart) OR A paper copy in the mail If you have any lab test that is abnormal or we need to change your treatment, we will call you to review the results.   Testing/Procedures: NONE    Follow-Up: At [facility], you and your health needs are our priority.  As part of our continuing mission to provide you with exceptional heart care, we have created designated Provider Care Teams.  These Care Teams include your primary Cardiologist (physician) and Advanced Practice Providers (APPs -  Physician Assistants and Nurse Practitioners) who all work together to provide you with the care you need, when you need it.  We recommend signing up for the patient portal called "MyChart".  Sign up information is provided on this After Visit Summary.  MyChart is used to connect with patients for Virtual Visits (Telemedicine).  Patients are able to view lab/test results, encounter notes, upcoming appointments, etc.  Non-urgent messages can be sent to your provider as well.   To learn more about what you can do with MyChart, go to [email or web address].    Your next appointment:   6 month(s)  Provider:   You may see Peter Nishan, MD or one of the following Advanced Practice Providers on your designated Care Team:   Brittany Strader, PA-C  Michele Lenze, PA-C     Other Instructions Thank you for choosing [facility]!    

## 2023-05-09 ENCOUNTER — Ambulatory Visit: Payer: Medicare HMO | Admitting: "Endocrinology

## 2023-05-30 ENCOUNTER — Encounter (HOSPITAL_COMMUNITY): Payer: Self-pay | Admitting: Emergency Medicine

## 2023-05-30 ENCOUNTER — Other Ambulatory Visit: Payer: Self-pay

## 2023-05-30 ENCOUNTER — Emergency Department (HOSPITAL_COMMUNITY)
Admission: EM | Admit: 2023-05-30 | Discharge: 2023-05-30 | Disposition: A | Discharge status: Home / Self Care | Payer: Medicare HMO | Attending: Emergency Medicine | Admitting: Emergency Medicine

## 2023-05-30 DIAGNOSIS — Z7984 Long term (current) use of oral hypoglycemic drugs: Secondary | ICD-10-CM | POA: Insufficient documentation

## 2023-05-30 DIAGNOSIS — Z7901 Long term (current) use of anticoagulants: Secondary | ICD-10-CM | POA: Insufficient documentation

## 2023-05-30 DIAGNOSIS — Z79899 Other long term (current) drug therapy: Secondary | ICD-10-CM | POA: Insufficient documentation

## 2023-05-30 DIAGNOSIS — E119 Type 2 diabetes mellitus without complications: Secondary | ICD-10-CM | POA: Insufficient documentation

## 2023-05-30 DIAGNOSIS — I10 Essential (primary) hypertension: Secondary | ICD-10-CM | POA: Insufficient documentation

## 2023-05-30 LAB — BASIC METABOLIC PANEL
Anion gap: 12 (ref 5–15)
BUN: 9 mg/dL (ref 8–23)
CO2: 21 mmol/L — ABNORMAL LOW (ref 22–32)
Calcium: 9.4 mg/dL (ref 8.9–10.3)
Chloride: 107 mmol/L (ref 98–111)
Creatinine, Ser: 0.91 mg/dL (ref 0.44–1.00)
GFR, Estimated: >60 mL/min (ref >60)
Glucose, Bld: 133 mg/dL — ABNORMAL HIGH (ref 70–99)
Potassium: 3.3 mmol/L — ABNORMAL LOW (ref 3.5–5.1)
Sodium: 140 mmol/L (ref 135–145)

## 2023-05-30 LAB — CBC
HCT: 35.7 % — ABNORMAL LOW (ref 36.0–46.0)
Hemoglobin: 10.4 g/dL — ABNORMAL LOW (ref 12.0–15.0)
MCH: 23.9 pg — ABNORMAL LOW (ref 26.0–34.0)
MCHC: 29.1 g/dL — ABNORMAL LOW (ref 30.0–36.0)
MCV: 81.9 fL (ref 80.0–100.0)
Platelets: 355 10*3/uL (ref 150–400)
RBC: 4.36 MIL/uL (ref 3.87–5.11)
RDW: 17.8 % — ABNORMAL HIGH (ref 11.5–15.5)
WBC: 4.9 10*3/uL (ref 4.0–10.5)
nRBC: 0 % (ref 0.0–0.2)

## 2023-05-30 LAB — TROPONIN I (HIGH SENSITIVITY): Troponin I (High Sensitivity): 5 ng/L (ref <18)

## 2023-05-30 MED ORDER — POTASSIUM CHLORIDE CRYS ER 20 MEQ PO TBCR
40.0000 meq | EXTENDED_RELEASE_TABLET | Freq: Once | ORAL | Status: AC
  Administered 2023-05-30: 40 meq via ORAL
  Filled 2023-05-30: qty 2

## 2023-05-30 MED ORDER — HYDRALAZINE HCL 25 MG PO TABS
50.0000 mg | ORAL_TABLET | Freq: Once | ORAL | Status: AC
  Administered 2023-05-30: 50 mg via ORAL
  Filled 2023-05-30: qty 2

## 2023-05-30 MED ORDER — HYDRALAZINE HCL 25 MG PO TABS: 50.0000 mg | ORAL_TABLET | Freq: Four times a day (QID) | ORAL | 0 refills | Status: DC

## 2023-05-30 NOTE — Discharge Instructions (Addendum)
You were seen for your high blood pressure in the emergency department.   At home, please take your blood pressure daily. Start taking the hydralazine we prescribed you 4 times daily instead of the 3 times daily.    Check your MyChart online for the results of any tests that had not resulted by the time you left the emergency department.   Follow-up with your primary doctor in 2-3 days regarding your visit.    Return immediately to the emergency department if you experience any of the following: chest pain, shortness of breath, severe headache, or any other concerning symptoms.    Thank you for visiting our Emergency Department. It was a pleasure taking care of you today.

## 2023-05-30 NOTE — ED Triage Notes (Signed)
Bp has been high per pt. Seen pcp Monday and increased her bp med. States it is still up. C/o some dizziness intermittent since. Nad. Ambulatory with steady gait. Has taken bp meds today and is 192/94 in triage.

## 2023-05-30 NOTE — ED Provider Notes (Signed)
[place] EMERGENCY DEPARTMENT AT [facility] Provider Note   CSN: [id number] Arrival date & time: 05/30/23  1025     History {Add pertinent medical, surgical, social history, OB history to HPI:1} Chief Complaint  Patient presents with   Hypertension    Samantha [name] is a 77 y.o. female.  77 year old female with history of hypertension, thyroid disease on methimazole, hyperlipidemia, diabetes who presents to the emergency department with elevated blood pressures.  Has been struggling with elevated blood pressures at home.  She has also been changing her blood pressure medication and discontinued her amlodipine recently and is currently on losartan 50 mg daily, metoprolol 50 mg twice daily, and hydralazine 50 mg 3 times daily.  Says that she has felt somewhat dizzy like her equilibrium is off but no headache, chest pain, vision changes, numbness or weakness of her arms or legs.       Home Medications Prior to Admission medications   Medication Sig Start Date End Date Taking? Authorizing Provider  acetaminophen (TYLENOL) 500 MG tablet Take 500-1,000 mg by mouth every 6 (six) hours as needed for mild pain.    [provider]  amLODipine (NORVASC) 10 MG tablet Take 10 mg by mouth daily. 04/19/23   [provider]  apixaban (ELIQUIS) 5 MG TABS tablet Take 1 tablet (5 mg total) by mouth 2 (two) times daily. 02/01/23   Ghimire, Werner Lean, MD  diclofenac Sodium (VOLTAREN) 1 % GEL Apply 4 g topically 4 (four) times daily. 03/12/23   Philomena Doheny, MD  ferrous sulfate 325 (65 FE) MG tablet Take 1 tablet (325 mg total) by mouth daily with breakfast. 03/13/23   Colbert Coyer, Priscila, MD  losartan (COZAAR) 50 MG tablet Take 1 tablet (50 mg total) by mouth daily. 03/13/23   Philomena Doheny, MD  melatonin 3 MG TABS tablet Take 1 tablet (3 mg total) by mouth at bedtime as needed (insomnia). 01/31/23   Ghimire, Werner Lean, MD  metFORMIN (GLUCOPHAGE-XR)  500 MG 24 hr tablet Take 1 tablet (500 mg total) by mouth 2 (two) times daily with a meal. 03/12/23   Colbert Coyer, Priscila, MD  methimazole (TAPAZOLE) 10 MG tablet Take 1 tablet (10 mg total) by mouth 3 (three) times daily. 03/12/23   Philomena Doheny, MD  metoprolol tartrate (LOPRESSOR) 50 MG tablet Take 1 tablet (50 mg total) by mouth 2 (two) times daily. 01/31/23   Ghimire, Werner Lean, MD      Allergies    Patient has no active allergies.    Review of Systems   Review of Systems  Physical Exam Updated Vital Signs BP (!) 192/94 (BP Location: Left Arm)   Pulse 64   Temp 97.8 F (36.6 C) (Oral)   Resp 17   SpO2 100%  Physical Exam Vitals and nursing note reviewed.  Constitutional:      General: She is not in acute distress.    Appearance: She is well-developed.  HENT:     Head: Normocephalic and atraumatic.     Right Ear: External ear normal.     Left Ear: External ear normal.     Nose: Nose normal.  Eyes:     Extraocular Movements: Extraocular movements intact.     Conjunctiva/sclera: Conjunctivae normal.     Pupils: Pupils are equal, round, and reactive to light.  Cardiovascular:     Rate and Rhythm: Normal rate and regular rhythm.     Heart sounds: No murmur heard. Pulmonary:  Effort: Pulmonary effort is normal. No respiratory distress.     Breath sounds: Normal breath sounds.  Musculoskeletal:     Cervical back: Normal range of motion and neck supple.     Right lower leg: No edema.     Left lower leg: No edema.  Skin:    General: Skin is warm and dry.  Neurological:     Mental Status: She is alert and oriented to person, place, and time. Mental status is at baseline.     Cranial Nerves: No cranial nerve deficit.     Sensory: No sensory deficit.     Motor: No weakness.     Gait: Gait normal.     Comments: Able to ambulate without assistance  Psychiatric:        Mood and Affect: Mood normal.     ED Results / Procedures / Treatments   Labs (all  labs ordered are listed, but only abnormal results are displayed) Labs Reviewed - No data to display  EKG None  Radiology No results found.  Procedures Procedures  {Document cardiac monitor, telemetry assessment procedure when appropriate:1}  Medications Ordered in ED Medications - No data to display  ED Course/ Medical Decision Making/ A&P   {   Click here for ABCD2, HEART and other calculatorsREFRESH Note before signing :1}                              Medical Decision Making Amount and/or Complexity of Data Reviewed Labs: ordered.  Risk Prescription drug management.   Samantha [name] is a 77 y.o. female with comorbidities that complicate the patient evaluation including hypertension, thyroid disease on methimazole, hyperlipidemia, diabetes who presents to the emergency department with elevated blood pressures.    Initial Ddx:  ***   MDM/Course:  *** Upon re-evaluation ***  This patient presents to the ED for concern of complaints listed in HPI, this involves an extensive number of treatment options, and is a complaint that carries with it a high risk of complications and morbidity. Disposition including potential need for admission considered.   Dispo: {Disposition:28069}  Additional history obtained from {Additional History:28067} Records reviewed {Records Reviewed:28068} The following labs were independently interpreted: {labs interpreted:28064} and show {lab findings:28250} I independently reviewed the following imaging with scope of interpretation limited to determining acute life threatening conditions related to emergency care: {imaging interpreted:28065} and agree with the radiologist interpretation with the following exceptions: none I personally reviewed and interpreted cardiac monitoring: {cardiac monitoring:28251} I personally reviewed and interpreted the pt's EKG: see above for interpretation  I have reviewed the patients home medications and made  adjustments as needed Consults: {Consultants:28063} Social Determinants of health:  ***  Portions of this note were generated with Scientist, clinical (histocompatibility and immunogenetics). Dictation errors may occur despite best attempts at proofreading.    {Document your decision making why or why not admission, treatments were needed:1} Final Clinical Impression(s) / ED Diagnoses Final diagnoses:  None    Rx / DC Orders ED Discharge Orders     None

## 2023-06-20 LAB — TSH: TSH: <0.005 u[IU]/mL — ABNORMAL LOW (ref 0.450–4.500)

## 2023-06-20 LAB — T3, FREE: T3, Free: 4 pg/mL (ref 2.0–4.4)

## 2023-06-20 LAB — T4, FREE: Free T4: 2.04 ng/dL — ABNORMAL HIGH (ref 0.82–1.77)

## 2023-06-21 ENCOUNTER — Encounter: Payer: Self-pay | Admitting: "Endocrinology

## 2023-06-21 ENCOUNTER — Ambulatory Visit: Payer: Medicare Other | Admitting: "Endocrinology

## 2023-06-21 VITALS — BP 132/68 | HR 68 | Ht 61.0 in | Wt 141.6 lb

## 2023-06-21 DIAGNOSIS — E0590 Thyrotoxicosis, unspecified without thyrotoxic crisis or storm: Secondary | ICD-10-CM | POA: Diagnosis not present

## 2023-06-21 MED ORDER — METHIMAZOLE 10 MG PO TABS: 20.0000 mg | ORAL_TABLET | Freq: Two times a day (BID) | ORAL | 3 refills | Status: DC

## 2023-06-21 NOTE — Progress Notes (Signed)
 06/21/2023, 2:13 PM   Endocrinology follow-up note  Subjective:    Patient ID: Samantha [name], female    DOB: [birth year], PCP Campbell Reynolds, NP   Past Medical History:  Diagnosis Date   Allergic rhinitis    Arthritis of both knees    Arthritis of both shoulder regions    Bilateral carpal tunnel syndrome    Cataract    Chronic low back pain    CKD (chronic kidney disease), stage III (HCC)    Decreased vision    Diabetes mellitus    HTN (hypertension)    Hyperlipidemia    Morbid obesity (HCC)    Thyroid  disease    Past Surgical History:  Procedure Laterality Date   CATARACT EXTRACTION     COLONOSCOPY     COLONOSCOPY N/A 09/15/2020   Procedure: COLONOSCOPY;  Surgeon: Shaaron Lamar HERO, MD;  Location: [facility];  Service: Endoscopy;  Laterality: N/A;  ASA II / PM procedure   POLYPECTOMY  09/15/2020   Procedure: POLYPECTOMY;  Surgeon: Shaaron Lamar HERO, MD;  Location: [facility] SUITE;  Service: Endoscopy;;   TUBAL LIGATION     Social History   Socioeconomic History   Marital status: Widowed    Spouse name: Not on file   Number of children: Not on file   Years of education: Not on file   Highest education level: Not on file  Occupational History   Not on file  Tobacco Use   Smoking status: Never   Smokeless tobacco: Never  Vaping Use   Vaping status: Never Used  Substance and Sexual Activity   Alcohol use: Yes    Comment: occasionally   Drug use: No   Sexual activity: Yes    Birth control/protection: Surgical    Comment: tubal  Other Topics Concern   Not on file  Social History Narrative   Not on file   Social Drivers of Health   Financial Resource Strain: Low Risk  (11/16/2022)   Overall Financial Resource Strain (CARDIA)    Difficulty of Paying Living Expenses: Not very hard  Food Insecurity: No Food Insecurity (03/11/2023)   Hunger Vital Sign    Worried About Running Out of Food in the Last Year: Never true     Ran Out of Food in the Last Year: Never true  Transportation Needs: No Transportation Needs (03/11/2023)   PRAPARE - Administrator, Civil Service (Medical): No    Lack of Transportation (Non-Medical): No  Physical Activity: Inactive (11/16/2022)   Exercise Vital Sign    Days of Exercise per Week: 0 days    Minutes of Exercise per Session: 0 min  Stress: No Stress Concern Present (11/16/2022)   [employer] of Occupational Health - Occupational Stress Questionnaire    Feeling of Stress : Only a little  Social Connections: Moderately Integrated (11/16/2022)   Social Connection and Isolation Panel [NHANES]    Frequency of Communication with Friends and Family: Three times a week    Frequency of Social Gatherings with Friends and Family: Once a week    Attends Religious Services: More than 4 times per year    Active Member of Clubs or Organizations: Yes  Attends Banker Meetings: 1 to 4 times per year    Marital Status: Widowed   Family History  Problem Relation Age of Onset   Hypertension Father    Hypertension Mother    Diabetes Mother    Breast cancer Mother    Hypertension Sister    Cancer Sister        pancreatic   Cancer Sister        pancreatic   Liver disease Sister    Outpatient Encounter Medications as of 06/21/2023  Medication Sig   acetaminophen  (TYLENOL ) 500 MG tablet Take 500-1,000 mg by mouth every 6 (six) hours as needed for mild pain.   amLODipine  (NORVASC ) 10 MG tablet Take 10 mg by mouth daily.   apixaban  (ELIQUIS ) 5 MG TABS tablet Take 1 tablet (5 mg total) by mouth 2 (two) times daily.   diclofenac  Sodium (VOLTAREN ) 1 % GEL Apply 4 g topically 4 (four) times daily.   ferrous sulfate  325 (65 FE) MG tablet Take 1 tablet (325 mg total) by mouth daily with breakfast.   hydrALAZINE  (APRESOLINE ) 25 MG tablet Take 2 tablets (50 mg total) by mouth 4 (four) times daily.   losartan  (COZAAR ) 50 MG tablet Take 1 tablet (50 mg total) by mouth  daily.   melatonin 3 MG TABS tablet Take 1 tablet (3 mg total) by mouth at bedtime as needed (insomnia).   metFORMIN  (GLUCOPHAGE -XR) 500 MG 24 hr tablet Take 1 tablet (500 mg total) by mouth 2 (two) times daily with a meal.   methimazole  (TAPAZOLE ) 10 MG tablet Take 2 tablets (20 mg total) by mouth 2 (two) times daily with a meal.   metoprolol  tartrate (LOPRESSOR ) 50 MG tablet Take 1 tablet (50 mg total) by mouth 2 (two) times daily.   [DISCONTINUED] methimazole  (TAPAZOLE ) 10 MG tablet Take 1 tablet (10 mg total) by mouth 3 (three) times daily.   No facility-administered encounter medications on file as of 06/21/2023.   ALLERGIES: No Active Allergies   VACCINATION STATUS: Immunization History  Administered Date(s) Administered   H1N1 06/30/2008   Influenza Whole 03/12/2006, 06/03/2007   Moderna Sars-Covid-2 Vaccination 07/20/2019, 08/20/2019   Pneumococcal Polysaccharide-23 01/22/2007    HPI Samantha [name] is 78 y.o. female who presents today with a medical history as above. she is being seen in follow-up after she was seen in consultation for hyperthyroidism requested by Campbell Reynolds, NP.  -Patient was accompanied by her daughter to clinic. She was diagnosed with primary hypothyroidism.  On thyroid  uptake and scan in May 2024.  After reviewing her options, she was treated with radioactive iodine treatment on November 28, 2022.  Her subsequent thyroid  function tests did not confirm treatment response.  She was never initiated on thyroid  hormone supplement due to the fact that she did not develop hypothyroidism. During her recent hospitalization for unrelated condition, she was found to have persistently elevated thyroid  hormone and undetectable TSH.    -She was started on methimazole  10 mg p.o. 3 times daily which was reinforced during her last visit on March 28, 2023.  Patient returns with similar findings suggestive of primary hypothyroidism.  However, this patient has not been consistent  taking her methimazole  consistently.    As it was the case even prior to her first treatment, she denies symptoms specifically denying palpitations, tremors, heat intolerance.  She denies dysphagia, shortness of breath, nor voice change.   She denies family history of thyroid  problems.   She does not have time  family history of thyroid  malignancy.  She presents with steady weight .  Her other medical problems include hypertension on treatment with amlodipine  and lisinopril.  She remains on metformin  500 mg p.o. twice daily for type 2 diabetes  with recent A1c of 7.1%.   Review of Systems  Constitutional: +mildly fluctuating body weight with recent weight  loss, no fatigue, no subjective hyperthermia, no subjective hypothermia Eyes: no blurry vision, no xerophthalmia  Neurological: no tremors, no numbness, no tingling, no dizziness Psychiatric: no depression, + anxiety  Objective:       06/21/2023   10:56 AM 05/30/2023    1:07 PM 05/30/2023   12:15 PM  Vitals with BMI  Height 5' 1    Weight 141 lbs 10 oz    BMI 26.77    Systolic 132 158 835  Diastolic 68 78 100  Pulse 68 61 64    BP 132/68   Pulse 68   Ht 5' 1 (1.549 m)   Wt 141 lb 9.6 oz (64.2 kg)   BMI 26.76 kg/m   Wt Readings from Last 3 Encounters:  06/21/23 141 lb 9.6 oz (64.2 kg)  04/23/23 144 lb 9.6 oz (65.6 kg)  03/28/23 138 lb 3.2 oz (62.7 kg)    Physical Exam  Constitutional:  Body mass index is 26.76 kg/m.,  not in acute distress, + anxious state of mind.  She ambulates with a walker due to disequilibrium. Eyes: PERRLA, EOMI, no exophthalmos    CMP ( most recent) CMP     Component Value Date/Time   NA 140 05/30/2023 1151   NA 140 03/28/2023 1359   K 3.3 (L) 05/30/2023 1151   CL 107 05/30/2023 1151   CO2 21 (L) 05/30/2023 1151   GLUCOSE 133 (H) 05/30/2023 1151   BUN 9 05/30/2023 1151   BUN 7 (L) 03/28/2023 1359   CREATININE 0.91 05/30/2023 1151   CALCIUM  9.4 05/30/2023 1151   PROT 6.5  03/28/2023 1359   ALBUMIN 3.4 (L) 03/28/2023 1359   AST 13 03/28/2023 1359   ALT 5 03/28/2023 1359   ALKPHOS 96 03/28/2023 1359   BILITOT 0.3 03/28/2023 1359   GFRNONAA >60 05/30/2023 1151   GFRAA 55 (L) 05/08/2018 2247     Diabetic Labs (most recent): Lab Results  Component Value Date   HGBA1C 7.1 (H) 03/08/2023   HGBA1C 9.8 (H) 01/23/2023   HGBA1C 8.7 (A) 01/09/2023   MICROALBUR 1.54 02/09/2009   MICROALBUR 0.64 01/16/2008   MICROALBUR 1.69 12/03/2006     Lipid Panel ( most recent) Lipid Panel     Component Value Date/Time   CHOL 166 02/09/2009 2026   TRIG 86 02/09/2009 2026   HDL 60 02/09/2009 2026   CHOLHDL 2.8 Ratio 02/09/2009 2026   VLDL 17 02/09/2009 2026   LDLCALC 89 02/09/2009 2026      Lab Results  Component Value Date   TSH <0.005 (L) 06/19/2023   TSH <0.005 (L) 03/28/2023   TSH <0.010 (L) 03/07/2023   TSH 0.028 (L) 01/23/2023   TSH <0.010 (L) 01/04/2023   FREET4 2.04 (H) 06/19/2023   FREET4 1.94 (H) 03/28/2023   FREET4 >5.50 (H) 03/07/2023   FREET4 4.47 (H) 01/23/2023   FREET4 >5.50 (H) 01/04/2023     Thyroid  uptake and scan on Oct 20, 2022 FINDINGS: Unremarkable LEFT thyroid  lobe.   Enlarged RIGHT thyroid  lobe containing warm and cold nodules.   Patient has previously undergone biopsy of an indeterminate nodule in the RIGHT lobe.  4 hour I-123 uptake = 29.9% (normal 5-20%), 24 hour I-123 uptake = 59.4% (normal 10-30%)   IMPRESSION: Enlarged multinodular thyroid  gland especially RIGHT lobe. Elevated 4 hour and 24 hour radio iodine uptakes consistent with hyperthyroidism.  Nuclear medicine radioactive iodine therapy for hyperthyroidism on November 28, 2022 IMPRESSION: Per oral administration of I-131 sodium iodide for the treatment of hyperthyroidism.    Assessment & Plan:   1. Hyperthyroidism 2.  Toxic multinodular goiter 3.  Type 2 diabetes  -After appropriate workup showed primary hyperthyroidism as a result of toxic multinodular  goiter, she was treated with radioactive iodine treatment on November 28, 2022.  Her subsequent thyroid  function tests were consistent with likely treatment failure.  This patient is highly suspicious for nonadherence, reports inconsistent intake of her methimazole .   Her thyroid  function tests remain consistent with primary hyperthyroidism.  It is likely that she will need repeat RAI treatment after reasonable control of her thyroid  towards target.  -I discussed and increase her methimazole  to 20 mg p.o. twice daily with breakfast and supper with plan to repeat thyroid  function test and office visit in 4-5 weeks.  Complications of untreated hyperthyroidism were discussed in detail with her including the risk of thyroid  storm.  -She is also on metoprolol  50 mg p.o. twice daily, advised to continue.  -She is advised to follow-up with her PMD for diabetes care, currently on metformin  500 mg p.o. twice daily with recent A1c of 7.1%  - she is advised to maintain close follow up with Campbell Reynolds, NP for primary care needs.   I spent  22  minutes in the care of the patient today including review of labs from Thyroid  Function, CMP, and other relevant labs ; imaging/biopsy records (current and previous including abstractions from other facilities); face-to-face time discussing  her lab results and symptoms, medications doses, her options of short and long term treatment based on the latest standards of care / guidelines;   and documenting the encounter.  Samantha [name]  participated in the discussions, expressed understanding, and voiced agreement with the above plans.  All questions were answered to her satisfaction. she is encouraged to contact clinic should she have any questions or concerns prior to her return visit.   Follow up plan: Return in about 5 weeks (around 07/26/2023) for F/U with Pre-visit Labs.   Ranny Earl, MD [facility] [address] [place], [place] [address] Phone: [phone]  Fax: [phone]     06/21/2023, 2:13 PM  This note was partially dictated with voice recognition software. Similar sounding words can be transcribed inadequately or may not  be corrected upon review.

## 2023-07-04 ENCOUNTER — Encounter: Payer: Self-pay | Admitting: Cardiovascular Disease

## 2023-07-04 NOTE — Telephone Encounter (Signed)
Error

## 2023-07-05 ENCOUNTER — Ambulatory Visit: Payer: Medicare Other | Attending: Adult Health | Admitting: Adult Health

## 2023-07-05 NOTE — Progress Notes (Deleted)
  Cardiology Office Note:  .   Date:  07/05/2023  ID:  Samantha, [name] 08/24/1945, MRN [id number] PCP: Hillery Aldo, NP  [facility] Providers Cardiologist:  Charlton Haws, MD  }   History of Present Illness: . With   Samantha [name] is a 78 y.o. 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 Eden Emms on 04/23/2023/   ROS: ***  Studies Reviewed: .        *** EKG Interpretation Date/Time:    Ventricular Rate:    PR Interval:    QRS Duration:    QT Interval:    QTC Calculation:   R Axis:      Text Interpretation:      Physical Exam:   VS:  There were no vitals taken for this visit.   Wt Readings from Last 3 Encounters:  06/21/23 141 lb 9.6 oz (64.2 kg)  04/23/23 144 lb 9.6 oz (65.6 kg)  03/28/23 138 lb 3.2 oz (62.7 kg)    GEN: Well nourished, well developed in no acute distress NECK: No JVD; No carotid bruits CARDIAC: ***RRR, no murmurs, rubs, gallops RESPIRATORY:  Clear to auscultation without rales, wheezing or rhonchi  ABDOMEN: Soft, non-tender, non-distended EXTREMITIES:  No edema; No deformity   ASSESSMENT AND PLAN: .   ***    {Are you ordering a CV Procedure (e.g. stress test, cath, DCCV, TEE, etc)?   Press F2        :[id number]}    Signed, Bettey Mare. Liborio Nixon, ANP, AACC

## 2023-07-05 NOTE — Progress Notes (Signed)
Cardiology Office Note:  .   Date:  07/06/2023  ID:  Samantha [name], Samantha [name] September 03, 1945, MRN [id number] PCP: Hillery Aldo, NP  [facility] Providers Cardiologist:  Charlton Haws, MD Cardiology APP:  Sharlene Dory, NP  History of Present Illness: Samantha [name] Kitchen   Samantha [name] is a 78 y.o. female with a past medical history of palpitations/SVT, paroxysmal atrial fibrillation, HTN, HLD, type 2 DM, CKD stage IIIb, hyperthyroidism s/p iodine ablation in 11/2022, chronic back pain. Patient is followed by Dr. Eden Emms and presents today for BP management.   Patient previously had a nuclear stress test in 05/2020 that was a low risk study without evidence of ischemia or infarction. 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 She was referred to endocrinology and underwent neuroactive iodine thyroid ablation in 11/2022. She was later admitted from 8/13--02/01/23 after she presented for evaluation of altered mental status and weakness. Upon arrival to the ED, she was tachycardic and BP was intermittently soft. EKG showed sinus tachycardia. She was admitted for acute metabolic encephalopathy due to AKI and hypernatremia. She was treated with IV fluids. While admitted, patient developed atrial fibrillation with RVR. She was started on IV diltiazem drip and was given IV lopressor. Cardiology was consulted. Found to have TSH low at 0.028 and free T4 elevated to 17.5. Echocardiogram on 01/24/23 showed EF >75%, no regional wall motion abnormalities, mild LVH, grade I DD, normal RV function. There was elevated LVOT peak velocity of 4 m/s, likely due to hyperdynamic LV function and chordal SAM. Patient converted to NSR and was discharged on eliquis 5 mg BID, metoprolol tartrate 50 mg BID.   Patient was last seen by Dr. Eden Emms on 04/23/23. At that time, her BP was well controlled. She remained in NSR on lopressor and eliquis. Of note, she was not completed compliant with methimazole or beta  blocker. Instructed to be compliant with her medications and follow up with endocrinology.   Today, patient presents with concerns about uncontrolled blood pressure despite medication compliance. She reports that her blood pressure readings at home, even after medication, range from 160-170/80-90. She notes minimal improvement after medication, with a decrease of only about 10 points. She expresses frustration with her current medication regimen, feeling that despite taking multiple medications, her health issues persists. She notes that her thyroid is still overactive, despite medication adjustments and an iodine ablation in the past. She is due for a follow-up with her endocrinologist in February. In addition to these issues, the patient mentioned that her PCP found protein in her urine. She denies any symptoms of chest pain, headaches, palpitations, or fluttering in her chest since starting metoprolol. She also denies any symptoms of dizziness or passing out. She reports no swelling in her ankles. Her BP is elevated today in clinic, but she did not take her BP medications yet today.   The patient expresses frustration with her current health status and a desire for a more effective treatment plan. She is open to medication changes and is willing to try new treatments to better manage her conditions.  ROS: Patient denies chest pain, palpitations, syncope, near syncope, shortness of breath, headaches   Studies Reviewed: .   Cardiac Studies & Procedures     STRESS TESTS  MYOCARDIAL PERFUSION IMAGING 05/27/2020  Narrative  Nuclear stress EF: 74%. The left ventricular ejection fraction is hyperdynamic (>65%).  The patient walked on a standard Bruce protocol treadmill test for 4 minutes.  She achieved  a peak heart rate of 162 which is 110% predicted maximal heart rate.  At peak exercise there were no ST or T wave changes to suggest ischemia. There was no QRS widening at peak exercise.  This is  a low risk study. The patient has fair exercise capacity. There is no evidence of infarction or ischemia.  ECHOCARDIOGRAM  ECHOCARDIOGRAM COMPLETE 01/24/2023  Narrative ECHOCARDIOGRAM REPORT    Patient Name:   Samantha [name] Date of Exam: 01/24/2023 Medical Rec #:  [id number]   Height:       61.0 in Accession #:    [id number]  Weight:       140.2 lb Date of Birth:  Dec 02, 1945   BSA:          1.624 m Patient Age:    77 years    BP:           142/79 mmHg Patient Gender: F           HR:           122 bpm. Exam Location:  Inpatient  Procedure: 2D Echo, Cardiac Doppler and Color Doppler  Indications:    Elevated troponin  History:        Patient has no prior history of Echocardiogram examinations. CKD, Signs/Symptoms:Fatigue; Risk Factors:Dyslipidemia, Hypertension and Diabetes. Sepsis.  Sonographer:    Wallie Char Referring Phys: Skip Mayer, A  IMPRESSIONS   1. Elevated LVOT peak velocity of 4 m/s likely due to hyperdynamic LV function and chordal SAM. 2. Left ventricular ejection fraction, by estimation, is >75%. The left ventricle has hyperdynamic function. The left ventricle has no regional wall motion abnormalities. There is mild concentric left ventricular hypertrophy. Left ventricular diastolic parameters are consistent with Grade I diastolic dysfunction (impaired relaxation). 3. Right ventricular systolic function is normal. The right ventricular size is normal. 4. The mitral valve is normal in structure. No evidence of mitral valve regurgitation. No evidence of mitral stenosis. 5. The aortic valve is normal in structure. Aortic valve regurgitation is not visualized. No aortic stenosis is present. 6. The inferior vena cava is dilated in size with >50% respiratory variability, suggesting right atrial pressure of 8 mmHg.  Comparison(s): No prior Echocardiogram.  FINDINGS Left Ventricle: Left ventricular ejection fraction, by estimation, is >75%. The left ventricle has  hyperdynamic function. The left ventricle has no regional wall motion abnormalities. The left ventricular internal cavity size was normal in size. There is mild concentric left ventricular hypertrophy. Left ventricular diastolic parameters are consistent with Grade I diastolic dysfunction (impaired relaxation).  Right Ventricle: The right ventricular size is normal. Right ventricular systolic function is normal.  Left Atrium: Left atrial size was normal in size.  Right Atrium: Right atrial size was normal in size.  Pericardium: Trivial pericardial effusion is present.  Mitral Valve: The mitral valve is normal in structure. No evidence of mitral valve regurgitation. No evidence of mitral valve stenosis. MV peak gradient, 6.2 mmHg. The mean mitral valve gradient is 3.0 mmHg.  Tricuspid Valve: The tricuspid valve is normal in structure. Tricuspid valve regurgitation is mild . No evidence of tricuspid stenosis.  Aortic Valve: The aortic valve is normal in structure. Aortic valve regurgitation is not visualized. No aortic stenosis is present. Aortic valve mean gradient measures 10.0 mmHg. Aortic valve peak gradient measures 17.3 mmHg. Aortic valve area, by VTI measures 1.57 cm.  Pulmonic Valve: The pulmonic valve was normal in structure. Pulmonic valve regurgitation is not visualized. No evidence of pulmonic stenosis.  Aorta: The aortic root is normal in size and structure.  Venous: The inferior vena cava is dilated in size with greater than 50% respiratory variability, suggesting right atrial pressure of 8 mmHg.  IAS/Shunts: No atrial level shunt detected by color flow Doppler.  Additional Comments: Elevated LVOT peak velocity of 4 m/s likely due to hyperdynamic LV function and chordal SAM.   LEFT VENTRICLE PLAX 2D LVIDd:         3.50 cm     Diastology LVIDs:         2.00 cm     LV e' medial:    6.81 cm/s LV PW:         0.90 cm     LV E/e' medial:  13.4 LV IVS:        0.70 cm     LV e'  lateral:   6.73 cm/s LVOT diam:     1.50 cm     LV E/e' lateral: 13.6 LV SV:         45 LV SV Index:   28 LVOT Area:     1.77 cm  LV Volumes (MOD) LV vol d, MOD A2C: 42.3 ml LV vol d, MOD A4C: 57.2 ml LV vol s, MOD A2C: 11.1 ml LV vol s, MOD A4C: 12.6 ml LV SV MOD A2C:     31.2 ml LV SV MOD A4C:     57.2 ml LV SV MOD BP:      37.5 ml  RIGHT VENTRICLE             IVC RV Basal diam:  2.90 cm     IVC diam: 2.30 cm RV S prime:     21.00 cm/s TAPSE (M-mode): 2.1 cm  LEFT ATRIUM             Index        RIGHT ATRIUM          Index LA diam:        3.20 cm 1.97 cm/m   RA Area:     8.47 cm LA Vol (A2C):   36.6 ml 22.54 ml/m  RA Volume:   15.80 ml 9.73 ml/m LA Vol (A4C):   37.9 ml 23.34 ml/m LA Biplane Vol: 39.4 ml 24.26 ml/m AORTIC VALVE AV Area (Vmax):    1.60 cm AV Area (Vmean):   1.55 cm AV Area (VTI):     1.57 cm AV Vmax:           208.00 cm/s AV Vmean:          145.500 cm/s AV VTI:            0.288 m AV Peak Grad:      17.3 mmHg AV Mean Grad:      10.0 mmHg LVOT Vmax:         188.00 cm/s LVOT Vmean:        127.500 cm/s LVOT VTI:          0.256 m LVOT/AV VTI ratio: 0.89  MITRAL VALVE                TRICUSPID VALVE MV Area (PHT): 3.89 cm     TR Peak grad:   36.5 mmHg MV Area VTI:   2.18 cm     TR Vmax:        302.00 cm/s MV Peak grad:  6.2 mmHg MV Mean grad:  3.0 mmHg     SHUNTS MV Vmax:  1.24 m/s     Systemic VTI:  0.26 m MV Vmean:      74.9 cm/s    Systemic Diam: 1.50 cm MV Decel Time: 195 msec MV E velocity: 91.30 cm/s MV A velocity: 136.00 cm/s MV E/A ratio:  0.67  Olga Millers MD Electronically signed by Olga Millers MD Signature Date/Time: 01/24/2023/10:45:00 AM    Final             Risk Assessment/Calculations:    CHA2DS2-VASc Score = 5   This indicates a 7.2% annual risk of stroke. The patient's score is based upon: CHF History: 0 HTN History: 1 Diabetes History: 1 Stroke History: 0 Vascular Disease History: 0 Age Score:  2 Gender Score: 1       Physical Exam:   VS:  BP (!) 160/96 (BP Location: Left Arm, Patient Position: Sitting, Cuff Size: Normal) Comment: Pt hasn't taking meds today  Pulse 65   Ht 5\' 1"  (1.549 m)   Wt 149 lb 12.8 oz (67.9 kg)   SpO2 100%   BMI 28.30 kg/m    Wt Readings from Last 3 Encounters:  07/06/23 149 lb 12.8 oz (67.9 kg)  06/21/23 141 lb 9.6 oz (64.2 kg)  04/23/23 144 lb 9.6 oz (65.6 kg)    GEN: Well nourished, well developed in no acute distress. Sitting comfortably on the exam table  NECK: No JVD; No carotid bruits CARDIAC: RRR, no murmurs, rubs, gallops. Radial pulses 2+ bilaterally  RESPIRATORY:  Clear to auscultation without rales, wheezing or rhonchi. Normal WOB on room air   ABDOMEN: Soft, non-tender, non-distended EXTREMITIES:  No edema in BLE; No deformity   ASSESSMENT AND PLAN: .   HTN  - Current medications include hydralazine 150 mg BID, losartan 100 mg daily, metoprolol tartrate 100 mg BID. Was previously on amlodipine, but developed ankle edema  - Suspect BP is in part elevated due to ongoing hyperthyroidism. Her PCP also told her she had protein in her urine - Labwork from 05/30/23 showed K 3.3, creatinine 0.91  - Patient has had multiple instances of hypokalemia in the past- I wonder if she may had some degree of hyperaldosteronism  - Start spironolactone 12.5 mg daily  - Stop losartan, start valsartan 160 mg daily  - Continue current doses of hydralazine and metoprolol  - Ordered BMP today, again in 2 weeks after medication adjustments  - Instructed patient to keep a BP log, checking BP twice daily for the next 2 weeks.  - Discussed low sodium diets  - If BP remains elevated at follow up, could consider increasing valsartan vs transitioning from metoprolol to carvedilol.   Paroxysmal Atrial Fibrillation  - Patient first diagnosed with PAF in 01/2023, had recurrence in 02/2023. Note, episodes occurred in the setting of uncontrolled hyperthyroidism  - EKG  today shows NSR. Patient denies palpitations.  - Continue eliquis 5 mg BID  - Continue metoprolol tartrate 100 mg BID   Hyperthyroidism  - Followed by endocrinology. Previously had neuroactive iodine thyroid ablation in 11/2022 - TSH <0.005, free T4 2.04, and T3 4.0 on 06/19/2023  - On methimazole  - Discussed relationship between afib and hyperthyroidism, hypertension. Encouraged consistent endocrine follow up and compliance with medications   CKD stage IIIb  - Patient carries diagnosis of CKD. However, looking through her recent lab works, appears that her baseline creatinine is around 0.8-0.9. eGFR 58-60  - Ordered repeat BMP today   Type 2 DM  - A1c 7.1 in 02/2023 - Followed  by PCP    Dispo: Follow up in 4-6 weeks   Signed, Jonita Albee, PA-C

## 2023-07-06 ENCOUNTER — Ambulatory Visit: Payer: Medicare Other | Attending: Cardiology | Admitting: Cardiology

## 2023-07-06 ENCOUNTER — Encounter: Payer: Self-pay | Admitting: Cardiology

## 2023-07-06 VITALS — BP 160/96 | HR 65 | Ht 61.0 in | Wt 149.8 lb

## 2023-07-06 DIAGNOSIS — I480 Paroxysmal atrial fibrillation: Secondary | ICD-10-CM

## 2023-07-06 DIAGNOSIS — I10 Essential (primary) hypertension: Secondary | ICD-10-CM

## 2023-07-06 DIAGNOSIS — E0590 Thyrotoxicosis, unspecified without thyrotoxic crisis or storm: Secondary | ICD-10-CM

## 2023-07-06 DIAGNOSIS — E119 Type 2 diabetes mellitus without complications: Secondary | ICD-10-CM | POA: Diagnosis not present

## 2023-07-06 DIAGNOSIS — N1832 Chronic kidney disease, stage 3b: Secondary | ICD-10-CM

## 2023-07-06 DIAGNOSIS — Z794 Long term (current) use of insulin: Secondary | ICD-10-CM

## 2023-07-06 MED ORDER — VALSARTAN 160 MG PO TABS: 160.0000 mg | ORAL_TABLET | Freq: Every day | ORAL | 2 refills | Status: DC

## 2023-07-06 MED ORDER — SPIRONOLACTONE 25 MG PO TABS: 12.5000 mg | ORAL_TABLET | Freq: Every day | ORAL | 3 refills | Status: DC

## 2023-07-06 NOTE — Patient Instructions (Signed)
Medication Instructions:  Stop Losartan. Start Valsartan 160 mg ( Take 1 Tablet Daily). Start Spirolactone 12.5 mg ( Take 1/2 of 25 mg Tablet Daily). *If you need a refill on your cardiac medications before your next appointment, please call your pharmacy*   Lab Work: BMET : Today Repeat BMET : To Be Done In 2 Weeks. If you have labs (blood work) drawn today and your tests are completely normal, you will receive your results only by: MyChart Message (if you have MyChart) OR A paper copy in the mail If you have any lab test that is abnormal or we need to change your treatment, we will call you to review the results.   Testing/Procedures: No Testing   Follow-Up: At [facility], you and your health needs are our priority.  As part of our continuing mission to provide you with exceptional heart care, we have created designated Provider Care Teams.  These Care Teams include your primary Cardiologist (physician) and Advanced Practice Providers (APPs -  Physician Assistants and Nurse Practitioners) who all work together to provide you with the care you need, when you need it.  We recommend signing up for the patient portal called "MyChart".  Sign up information is provided on this After Visit Summary.  MyChart is used to connect with patients for Virtual Visits (Telemedicine).  Patients are able to view lab/test results, encounter notes, upcoming appointments, etc.  Non-urgent messages can be sent to your provider as well.   To learn more about what you can do with MyChart, go to [email or web address].    Your next appointment:   4 week(s)  Provider:   Robet Leu, PA-C       Other Instructions

## 2023-07-07 ENCOUNTER — Other Ambulatory Visit: Payer: Self-pay

## 2023-07-07 ENCOUNTER — Emergency Department (HOSPITAL_COMMUNITY): Payer: Medicare Other

## 2023-07-07 ENCOUNTER — Emergency Department (HOSPITAL_COMMUNITY)
Admission: EM | Admit: 2023-07-07 | Discharge: 2023-07-07 | Disposition: A | Discharge status: Home / Self Care | Payer: Medicare Other | Attending: Emergency Medicine | Admitting: Emergency Medicine

## 2023-07-07 ENCOUNTER — Encounter (HOSPITAL_COMMUNITY): Payer: Self-pay

## 2023-07-07 DIAGNOSIS — S0990XA Unspecified injury of head, initial encounter: Secondary | ICD-10-CM | POA: Diagnosis present

## 2023-07-07 DIAGNOSIS — Z7982 Long term (current) use of aspirin: Secondary | ICD-10-CM | POA: Insufficient documentation

## 2023-07-07 DIAGNOSIS — W06XXXA Fall from bed, initial encounter: Secondary | ICD-10-CM | POA: Diagnosis not present

## 2023-07-07 DIAGNOSIS — Z7901 Long term (current) use of anticoagulants: Secondary | ICD-10-CM | POA: Insufficient documentation

## 2023-07-07 LAB — CBC
HCT: 33.3 % — ABNORMAL LOW (ref 36.0–46.0)
Hemoglobin: 10.2 g/dL — ABNORMAL LOW (ref 12.0–15.0)
MCH: 23.8 pg — ABNORMAL LOW (ref 26.0–34.0)
MCHC: 30.6 g/dL (ref 30.0–36.0)
MCV: 77.6 fL — ABNORMAL LOW (ref 80.0–100.0)
Platelets: 313 10*3/uL (ref 150–400)
RBC: 4.29 MIL/uL (ref 3.87–5.11)
RDW: 18 % — ABNORMAL HIGH (ref 11.5–15.5)
WBC: 6.3 10*3/uL (ref 4.0–10.5)
nRBC: 0 % (ref 0.0–0.2)

## 2023-07-07 LAB — BASIC METABOLIC PANEL
Anion gap: 12 (ref 5–15)
BUN/Creatinine Ratio: 15 (ref 12–28)
BUN: 16 mg/dL (ref 8–27)
BUN: 13 mg/dL (ref 8–23)
CO2: 21 mmol/L (ref 20–29)
CO2: 21 mmol/L — ABNORMAL LOW (ref 22–32)
Calcium: 9.9 mg/dL (ref 8.7–10.3)
Calcium: 9.6 mg/dL (ref 8.9–10.3)
Chloride: 105 mmol/L (ref 96–106)
Chloride: 105 mmol/L (ref 98–111)
Creatinine, Ser: 1.08 mg/dL — ABNORMAL HIGH (ref 0.57–1.00)
Creatinine, Ser: 0.94 mg/dL (ref 0.44–1.00)
GFR, Estimated: >60 mL/min (ref >60)
Glucose, Bld: 128 mg/dL — ABNORMAL HIGH (ref 70–99)
Glucose: 99 mg/dL (ref 70–99)
Potassium: 4.4 mmol/L (ref 3.5–5.2)
Potassium: 3.5 mmol/L (ref 3.5–5.1)
Sodium: 142 mmol/L (ref 134–144)
Sodium: 138 mmol/L (ref 135–145)
eGFR: 53 mL/min/{1.73_m2} — ABNORMAL LOW (ref >59)

## 2023-07-07 NOTE — ED Notes (Signed)
Pt ambulated around nursing station Pt needed no assistance or assistive devices Pt has no complaints ambulating  Pt did states she still feels a little weak, but much better than when arrived Pt states she would like to go home

## 2023-07-07 NOTE — ED Triage Notes (Signed)
Pt here from home after having a spell of weakness while trying to get into bed and falling. Pt did hit her head but denies LOC. Pt currently taking eliquis. A&Ox4

## 2023-07-07 NOTE — Discharge Instructions (Addendum)
Follow-up with your primary care physician.  If you develop new or worsening headache, chest pain, shortness of breath, dizziness, or any other new/concerning symptoms then return to the ER or call 911.

## 2023-07-07 NOTE — ED Notes (Signed)
Pt ate saltines and drank water with no issues

## 2023-07-07 NOTE — ED Provider Notes (Signed)
[place] EMERGENCY DEPARTMENT AT [facility] Provider Note   CSN: [id number] Arrival date & time: 07/07/23  1549     History  Chief Complaint  Patient presents with   Marletta Lor    Samantha [name] is a 78 y.o. female.  HPI 78 year old female presents with a fall and head injury.  She states that she was going to her bed and felt acutely woozy while walking and ended up falling and hitting the back of her head on a nightstand.  She states the injury was pretty minor and she has only minimal discomfort.  No LOC.  No syncope.  She feels a little bit woozy though not as bad right now.  No palpitations, chest pain, shortness of breath.  No recent illness or feeling bad prior to this.  No other injuries.  She is on Eliquis for A-fib.  She tells me she has not eaten all day and has not drink as much as normal.  Home Medications Prior to Admission medications   Medication Sig Start Date End Date Taking? Authorizing Provider  acetaminophen (TYLENOL) 500 MG tablet Take 500-1,000 mg by mouth every 6 (six) hours as needed for mild pain.   Yes [provider]  apixaban (ELIQUIS) 5 MG TABS tablet Take 1 tablet (5 mg total) by mouth 2 (two) times daily. 02/01/23  Yes Ghimire, Werner Lean, MD  aspirin EC 81 MG tablet Take 81 mg by mouth daily. Swallow whole.   Yes [provider]  diclofenac Sodium (VOLTAREN) 1 % GEL Apply 4 g topically 4 (four) times daily. 03/12/23  Yes Philomena Doheny, MD  ferrous sulfate 325 (65 FE) MG tablet Take 1 tablet (325 mg total) by mouth daily with breakfast. 03/13/23  Yes Colbert Coyer, Priscila, MD  folic acid (FOLVITE) 1 MG tablet Take 1 mg by mouth every morning. 06/01/23  Yes [provider]  hydrALAZINE (APRESOLINE) 100 MG tablet Take 150 mg by mouth 2 (two) times daily. 1 1/2 tablet 06/29/23  Yes [provider]  hydrochlorothiazide (HYDRODIURIL) 12.5 MG tablet Take 12.5 mg by mouth daily as needed. 05/07/23  Yes [provider]  metFORMIN (GLUCOPHAGE-XR) 500 MG 24 hr tablet Take 1 tablet (500 mg total) by mouth 2 (two) times daily with a meal. 03/12/23  Yes Colbert Coyer, Priscila, MD  methimazole (TAPAZOLE) 10 MG tablet Take 2 tablets (20 mg total) by mouth 2 (two) times daily with a meal. 06/21/23  Yes Nida, Denman George, MD  metoprolol tartrate (LOPRESSOR) 100 MG tablet Take 100 mg by mouth 2 (two) times daily. 06/21/23  Yes [provider]  spironolactone (ALDACTONE) 25 MG tablet Take 0.5 tablets (12.5 mg total) by mouth daily. 07/06/23 10/04/23  Jonita Albee, PA-C  valsartan (DIOVAN) 160 MG tablet Take 1 tablet (160 mg total) by mouth daily. 07/06/23   Jonita Albee, PA-C      Allergies    Patient has no active allergies.    Review of Systems   Review of Systems  Constitutional:  Negative for fever.  Respiratory:  Negative for shortness of breath.   Cardiovascular:  Negative for chest pain and palpitations.  Gastrointestinal:  Negative for vomiting.  Musculoskeletal:  Negative for neck pain.  Neurological:  Positive for light-headedness. Negative for syncope, weakness and numbness.    Physical Exam Updated Vital Signs BP (!) 156/70   Pulse (!) 56   Temp 98.1 F (36.7 C) (Oral)   Resp 14   Ht  5\' 1"  (1.549 m)   Wt 67.9 kg   SpO2 100%   BMI 28.28 kg/m  Physical Exam Vitals and nursing note reviewed.  Constitutional:      General: She is not in acute distress.    Appearance: She is well-developed. She is not ill-appearing or diaphoretic.  HENT:     Head: Normocephalic and atraumatic. No abrasion, contusion, masses or laceration.  Eyes:     Extraocular Movements: Extraocular movements intact.     Pupils: Pupils are equal, round, and reactive to light.  Cardiovascular:     Rate and Rhythm: Normal rate and regular rhythm.     Heart sounds: Normal heart sounds.  Pulmonary:     Effort: Pulmonary effort is normal.     Breath sounds: Normal breath sounds.   Abdominal:     General: There is no distension.     Palpations: Abdomen is soft.     Tenderness: There is no abdominal tenderness.  Musculoskeletal:     Cervical back: No spinous process tenderness or muscular tenderness.  Skin:    General: Skin is warm and dry.  Neurological:     Mental Status: She is alert.     Comments: CN 3-12 grossly intact. 5/5 strength in all 4 extremities. Grossly normal sensation. Normal finger to nose.      ED Results / Procedures / Treatments   Labs (all labs ordered are listed, but only abnormal results are displayed) Labs Reviewed  CBC - Abnormal; Notable for the following components:      Result Value   Hemoglobin 10.2 (*)    HCT 33.3 (*)    MCV 77.6 (*)    MCH 23.8 (*)    RDW 18.0 (*)    All other components within normal limits  BASIC METABOLIC PANEL - Abnormal; Notable for the following components:   CO2 21 (*)    Glucose, Bld 128 (*)    All other components within normal limits    EKG None  Radiology CT Head Wo Contrast Result Date: 07/07/2023 CLINICAL DATA:  Fall out of bed. Blunt head trauma. On anticoagulation. EXAM: CT HEAD WITHOUT CONTRAST TECHNIQUE: Contiguous axial images were obtained from the base of the skull through the vertex without intravenous contrast. RADIATION DOSE REDUCTION: This exam was performed according to the departmental dose-optimization program which includes automated exposure control, adjustment of the mA and/or kV according to patient size and/or use of iterative reconstruction technique. COMPARISON:  01/23/2023 FINDINGS: Brain: No evidence of intracranial hemorrhage, acute infarction, hydrocephalus, extra-axial collection, or mass lesion/mass effect. Mild chronic small vessel disease is again noted. Vascular:  No hyperdense vessel or other acute findings. Skull: No evidence of fracture or other significant bone abnormality. Sinuses/Orbits:  No acute findings. Other: None. IMPRESSION: No acute intracranial  abnormality. Mild chronic small vessel disease. Electronically Signed   By: Danae Orleans M.D.   On: 07/07/2023 17:29    Procedures Procedures    Medications Ordered in ED Medications - No data to display  ED Course/ Medical Decision Making/ A&P                                 Medical Decision Making Amount and/or Complexity of Data Reviewed Labs: ordered.    Details: Unremarkable electrolytes.  Chronic anemia. Radiology: ordered and independent interpretation performed.    Details: No head bleed ECG/medicine tests: ordered and independent interpretation performed.    Details:  Sinus bradycardia, unchanged from baseline   Patient presents with a minor head injury after a fall.  CT head is negative.  Vital signs are overall reassuring, has some mild bradycardia but no pathologic arrhythmia.  She is feeling well after eating and drinking here and able to ambulate without any recurrence of symptoms.  She did not actually pass out and I think she is stable for an outpatient follow-up with her PCP and/your cardiologist.  Will give return precautions, appears stable for discharge.       Final Clinical Impression(s) / ED Diagnoses Final diagnoses:  Minor head injury, initial encounter    Rx / DC Orders ED Discharge Orders     None         Pricilla Loveless, MD 07/07/23 2014

## 2023-07-10 ENCOUNTER — Telehealth: Payer: Self-pay

## 2023-07-10 NOTE — Telephone Encounter (Signed)
Left message to call back

## 2023-07-10 NOTE — Telephone Encounter (Signed)
-----   Message from Jonita Albee sent at 07/09/2023  4:10 PM EST ----- Please tell patient that her lab work showed sable kidney function, normal electrolytes. Remind her to come in for repeat BMP in 2 weeks (already ordered).   Also- it was not on her physical med list, but on the computer list there is PRN hydrochlorothiazide. If she has been taking hydrochlorothiazide, she should stop it with start of spironolactone.   Thanks [employer]

## 2023-07-11 NOTE — Telephone Encounter (Signed)
Left message to call back

## 2023-07-12 NOTE — Telephone Encounter (Signed)
Patient identification verified by 2 forms. Marilynn Rail, RN    Called and spoke to patient  Relayed provider result message below  Patient states:   -PCP gave her Rx for hydrochlorothiazide to take as needed for swelling   -she picked up spironolactone Rx yesterday   -will need samples for Eliquis   -cost of Eliquis right now is $302  Informed patient:   -D/C hydrochlorothiazide, she is unable to take this with spironolactone   -likely increased cost is due to deductible   -contact insurance, review plan and inquire what cost of Rx will be after deductible is met  -if deducible is not the issue contact office for assistance  Patient verbalized understanding, no questions at this time

## 2023-07-12 NOTE — Telephone Encounter (Signed)
Patient is returning call and is requesting call back.   She would also like to discuss getting samples for: apixaban (ELIQUIS) 5 MG TABS tablet

## 2023-07-12 NOTE — Addendum Note (Signed)
Addended by: Marilynn Rail on: 07/12/2023 09:50 AM   Modules accepted: Orders

## 2023-07-25 LAB — COMPREHENSIVE METABOLIC PANEL
ALT: 10 [IU]/L (ref 0–32)
AST: 14 [IU]/L (ref 0–40)
Albumin: 4.1 g/dL (ref 3.8–4.8)
Alkaline Phosphatase: 127 [IU]/L — ABNORMAL HIGH (ref 44–121)
BUN/Creatinine Ratio: 15 (ref 12–28)
BUN: 17 mg/dL (ref 8–27)
Bilirubin Total: 0.2 mg/dL (ref 0.0–1.2)
CO2: 24 mmol/L (ref 20–29)
Calcium: 9.6 mg/dL (ref 8.7–10.3)
Chloride: 106 mmol/L (ref 96–106)
Creatinine, Ser: 1.11 mg/dL — ABNORMAL HIGH (ref 0.57–1.00)
Globulin, Total: 2.5 g/dL (ref 1.5–4.5)
Glucose: 101 mg/dL — ABNORMAL HIGH (ref 70–99)
Potassium: 4.2 mmol/L (ref 3.5–5.2)
Sodium: 142 mmol/L (ref 134–144)
Total Protein: 6.6 g/dL (ref 6.0–8.5)
eGFR: 52 mL/min/{1.73_m2} — ABNORMAL LOW (ref >59)

## 2023-07-25 LAB — T4, FREE: Free T4: 0.49 ng/dL — ABNORMAL LOW (ref 0.82–1.77)

## 2023-07-25 LAB — TSH: TSH: 3.78 u[IU]/mL (ref 0.450–4.500)

## 2023-07-25 LAB — T3, FREE: T3, Free: 1.8 pg/mL — ABNORMAL LOW (ref 2.0–4.4)

## 2023-07-26 ENCOUNTER — Encounter: Payer: Self-pay | Admitting: "Endocrinology

## 2023-07-26 ENCOUNTER — Ambulatory Visit: Payer: Medicare Other | Admitting: "Endocrinology

## 2023-07-26 VITALS — BP 138/82 | HR 60 | Ht 61.0 in | Wt 148.6 lb

## 2023-07-26 DIAGNOSIS — E0520 Thyrotoxicosis with toxic multinodular goiter without thyrotoxic crisis or storm: Secondary | ICD-10-CM | POA: Diagnosis not present

## 2023-07-26 DIAGNOSIS — Z7984 Long term (current) use of oral hypoglycemic drugs: Secondary | ICD-10-CM

## 2023-07-26 DIAGNOSIS — I10 Essential (primary) hypertension: Secondary | ICD-10-CM | POA: Diagnosis not present

## 2023-07-26 DIAGNOSIS — E0590 Thyrotoxicosis, unspecified without thyrotoxic crisis or storm: Secondary | ICD-10-CM | POA: Diagnosis not present

## 2023-07-26 DIAGNOSIS — E782 Mixed hyperlipidemia: Secondary | ICD-10-CM

## 2023-07-26 DIAGNOSIS — E119 Type 2 diabetes mellitus without complications: Secondary | ICD-10-CM | POA: Diagnosis not present

## 2023-07-26 MED ORDER — METHIMAZOLE 10 MG PO TABS: 10.0000 mg | ORAL_TABLET | Freq: Every day | ORAL | 1 refills | Status: DC

## 2023-07-26 NOTE — Progress Notes (Signed)
07/26/2023, 10:50 AM   Endocrinology follow-up note  Subjective:    Patient ID: Samantha [name], female    DOB: 28-Jul-1945, PCP Hillery Aldo, NP   Past Medical History:  Diagnosis Date   Allergic rhinitis    Arthritis of both knees    Arthritis of both shoulder regions    Bilateral carpal tunnel syndrome    Cataract    Chronic low back pain    CKD (chronic kidney disease), stage III (HCC)    Decreased vision    Diabetes mellitus    HTN (hypertension)    Hyperlipidemia    Morbid obesity (HCC)    Thyroid disease    Past Surgical History:  Procedure Laterality Date   CATARACT EXTRACTION     COLONOSCOPY     COLONOSCOPY N/A 09/15/2020   Procedure: COLONOSCOPY;  Surgeon: Corbin Ade, MD;  Location: [facility];  Service: Endoscopy;  Laterality: N/A;  ASA II / PM procedure   POLYPECTOMY  09/15/2020   Procedure: POLYPECTOMY;  Surgeon: Corbin Ade, MD;  Location: [facility];  Service: Endoscopy;;   TUBAL LIGATION     Social History   Socioeconomic History   Marital status: Widowed    Spouse name: Not on file   Number of children: Not on file   Years of education: Not on file   Highest education level: Not on file  Occupational History   Not on file  Tobacco Use   Smoking status: Never   Smokeless tobacco: Never  Vaping Use   Vaping status: Never Used  Substance and Sexual Activity   Alcohol use: Yes    Comment: occasionally   Drug use: No   Sexual activity: Yes    Birth control/protection: Surgical    Comment: tubal  Other Topics Concern   Not on file  Social History Narrative   Not on file   Social Drivers of Health   Financial Resource Strain: Low Risk  (11/16/2022)   Overall Financial Resource Strain (CARDIA)    Difficulty of Paying Living Expenses: Not very hard  Food Insecurity: No Food Insecurity (03/11/2023)   Hunger Vital Sign    Worried About Running Out of Food in the Last Year: Never true     Ran Out of Food in the Last Year: Never true  Transportation Needs: No Transportation Needs (03/11/2023)   PRAPARE - Administrator, Civil Service (Medical): No    Lack of Transportation (Non-Medical): No  Physical Activity: Inactive (11/16/2022)   Exercise Vital Sign    Days of Exercise per Week: 0 days    Minutes of Exercise per Session: 0 min  Stress: No Stress Concern Present (11/16/2022)   [employer] of Occupational Health - Occupational Stress Questionnaire    Feeling of Stress : Only a little  Social Connections: Moderately Integrated (11/16/2022)   Social Connection and Isolation Panel [NHANES]    Frequency of Communication with Friends and Family: Three times a week    Frequency of Social Gatherings with Friends and Family: Once a week    Attends Religious Services: More than 4 times per year    Active Member of Clubs or Organizations: Yes  Attends Banker Meetings: 1 to 4 times per year    Marital Status: Widowed   Family History  Problem Relation Age of Onset   Hypertension Father    Hypertension Mother    Diabetes Mother    Breast cancer Mother    Hypertension Sister    Cancer Sister        pancreatic   Cancer Sister        pancreatic   Liver disease Sister    Outpatient Encounter Medications as of 07/26/2023  Medication Sig   acetaminophen (TYLENOL) 500 MG tablet Take 500-1,000 mg by mouth every 6 (six) hours as needed for mild pain.   apixaban (ELIQUIS) 5 MG TABS tablet Take 1 tablet (5 mg total) by mouth 2 (two) times daily.   aspirin EC 81 MG tablet Take 81 mg by mouth daily. Swallow whole.   diclofenac Sodium (VOLTAREN) 1 % GEL Apply 4 g topically 4 (four) times daily.   ferrous sulfate 325 (65 FE) MG tablet Take 1 tablet (325 mg total) by mouth daily with breakfast.   folic acid (FOLVITE) 1 MG tablet Take 1 mg by mouth every morning.   hydrALAZINE (APRESOLINE) 100 MG tablet Take 150 mg by mouth 2 (two) times daily. 1 1/2 tablet    metFORMIN (GLUCOPHAGE-XR) 500 MG 24 hr tablet Take 1 tablet (500 mg total) by mouth 2 (two) times daily with a meal. (Patient taking differently: Take 500 mg by mouth daily with breakfast.)   methimazole (TAPAZOLE) 10 MG tablet Take 1 tablet (10 mg total) by mouth daily with breakfast.   metoprolol tartrate (LOPRESSOR) 100 MG tablet Take 100 mg by mouth 2 (two) times daily.   spironolactone (ALDACTONE) 25 MG tablet Take 0.5 tablets (12.5 mg total) by mouth daily.   valsartan (DIOVAN) 160 MG tablet Take 1 tablet (160 mg total) by mouth daily.   [DISCONTINUED] methimazole (TAPAZOLE) 10 MG tablet Take 2 tablets (20 mg total) by mouth 2 (two) times daily with a meal.   No facility-administered encounter medications on file as of 07/26/2023.   ALLERGIES: No Known Allergies   VACCINATION STATUS: Immunization History  Administered Date(s) Administered   H1N1 06/30/2008   Influenza Whole 03/12/2006, 06/03/2007   Moderna Sars-Covid-2 Vaccination 07/20/2019, 08/20/2019   Pneumococcal Polysaccharide-23 01/22/2007    HPI Samantha [name] is 78 y.o. female who presents today with a medical history as above. she is being seen in follow-up after she was seen in consultation for hyperthyroidism requested by Hillery Aldo, NP.   She was diagnosed with primary hypothyroidism.  On thyroid uptake and scan in May 2024.  After reviewing her options, she was treated with radioactive iodine treatment on November 28, 2022.  Her subsequent thyroid function tests did not confirm treatment response.  She was never initiated on thyroid hormone supplement due to the fact that she did not develop hypothyroidism. During her recent hospitalization for unrelated condition, she was found to have persistently elevated thyroid hormone and undetectable TSH.    -She was started on methimazole which was increased to 20 mg p.o. twice daily during her last visit.  She presents with evidence of treatment response.   She reports taking  her methimazole consistently. As it was the case even prior to her first treatment, she denies symptoms specifically denying palpitations, tremors, heat or cold intolerance.  She denies dysphagia, shortness of breath, nor voice change.   She denies family history of thyroid problems.   She does not  have  family history of thyroid malignancy.  She presents with 5 pounds of weight gain.    Her other medical problems include hypertension on treatment with amlodipine and lisinopril.  She remains on metformin 500 mg p.o. twice daily for type 2 diabetes  with recent A1c of 7.1%.   Review of Systems  Constitutional: +mildly fluctuating body weight with recent weight gain, no fatigue, no subjective hyperthermia, no subjective hypothermia Eyes: no blurry vision, no xerophthalmia  Neurological: no tremors, no numbness, no tingling, no dizziness Psychiatric: no depression,  Objective:       07/26/2023    9:52 AM 07/07/2023    8:18 PM 07/07/2023    5:45 PM  Vitals with BMI  Height 5\' 1"     Weight 148 lbs 10 oz    BMI 28.09    Systolic 138 155 161  Diastolic 82 72 70  Pulse 60 58 56    BP 138/82   Pulse 60   Ht 5\' 1"  (1.549 m)   Wt 148 lb 9.6 oz (67.4 kg)   BMI 28.08 kg/m   Wt Readings from Last 3 Encounters:  07/26/23 148 lb 9.6 oz (67.4 kg)  07/07/23 149 lb 11.1 oz (67.9 kg)  07/06/23 149 lb 12.8 oz (67.9 kg)    Physical Exam  Constitutional:  Body mass index is 28.08 kg/m.,  not in acute distress, + anxious state of mind.  She ambulates with a walker due to disequilibrium. Eyes: PERRLA, EOMI, no exophthalmos    CMP ( most recent) CMP     Component Value Date/Time   NA 142 07/24/2023 1531   K 4.2 07/24/2023 1531   CL 106 07/24/2023 1531   CO2 24 07/24/2023 1531   GLUCOSE 101 (H) 07/24/2023 1531   GLUCOSE 128 (H) 07/07/2023 1711   BUN 17 07/24/2023 1531   CREATININE 1.11 (H) 07/24/2023 1531   CALCIUM 9.6 07/24/2023 1531   PROT 6.6 07/24/2023 1531   ALBUMIN 4.1  07/24/2023 1531   AST 14 07/24/2023 1531   ALT 10 07/24/2023 1531   ALKPHOS 127 (H) 07/24/2023 1531   BILITOT 0.2 07/24/2023 1531   GFRNONAA >60 07/07/2023 1711   GFRAA 55 (L) 05/08/2018 2247     Diabetic Labs (most recent): Lab Results  Component Value Date   HGBA1C 7.1 (H) 03/08/2023   HGBA1C 9.8 (H) 01/23/2023   HGBA1C 8.7 (A) 01/09/2023   MICROALBUR 1.54 02/09/2009   MICROALBUR 0.64 01/16/2008   MICROALBUR 1.69 12/03/2006     Lipid Panel ( most recent) Lipid Panel     Component Value Date/Time   CHOL 166 02/09/2009 2026   TRIG 86 02/09/2009 2026   HDL 60 02/09/2009 2026   CHOLHDL 2.8 Ratio 02/09/2009 2026   VLDL 17 02/09/2009 2026   LDLCALC 89 02/09/2009 2026      Lab Results  Component Value Date   TSH 3.780 07/24/2023   TSH <0.005 (L) 06/19/2023   TSH <0.005 (L) 03/28/2023   TSH <0.010 (L) 03/07/2023   TSH 0.028 (L) 01/23/2023   FREET4 0.49 (L) 07/24/2023   FREET4 2.04 (H) 06/19/2023   FREET4 1.94 (H) 03/28/2023   FREET4 >5.50 (H) 03/07/2023   FREET4 4.47 (H) 01/23/2023     Thyroid uptake and scan on Oct 20, 2022 FINDINGS: Unremarkable LEFT thyroid lobe.   Enlarged RIGHT thyroid lobe containing warm and cold nodules.   Patient has previously undergone biopsy of an indeterminate nodule in the RIGHT lobe.   4 hour I-123  uptake = 29.9% (normal 5-20%), 24 hour I-123 uptake = 59.4% (normal 10-30%)   IMPRESSION: Enlarged multinodular thyroid gland especially RIGHT lobe. Elevated 4 hour and 24 hour radio iodine uptakes consistent with hyperthyroidism.  Nuclear medicine radioactive iodine therapy for hyperthyroidism on November 28, 2022 IMPRESSION: Per oral administration of I-131 sodium iodide for the treatment of hyperthyroidism.    Assessment & Plan:   1. Hyperthyroidism 2.  Toxic multinodular goiter 3.  Type 2 diabetes  -After appropriate workup showed primary hyperthyroidism as a result of toxic multinodular goiter, she was treated with  radioactive iodine treatment on November 28, 2022.  Her subsequent thyroid function tests were consistent with likely treatment failure.  This patient is highly suspicious for nonadherence, reports inconsistent intake of her methimazole.   Her thyroid function tests remain consistent with primary hyperthyroidism.  It is likely that she will need repeat RAI treatment after reasonable control of her thyroid towards target.  -Since she has responded to high-dose methimazole, I advised her to lower her methimazole to 10 mg p.o. daily at breakfast until next measurement in 7 weeks.     Complications of untreated hyperthyroidism were discussed in detail with her including the risk of thyroid storm.  -She is also on metoprolol 50 mg p.o. twice daily, advised to continue. -If she presents with a near target thyroid function test, she will be considered for repeat thyroid uptake and scan in preparation for possible subsequent repeat radioactive iodine thyroid ablation.  -She is advised to follow-up with her PMD for diabetes care, currently on metformin 500 mg p.o. twice daily with recent A1c of 7.1%  - she is advised to maintain close follow up with Hillery Aldo, NP for primary care needs.    I spent  25  minutes in the care of the patient today including review of labs from Thyroid Function, CMP, and other relevant labs ; imaging/biopsy records (current and previous including abstractions from other facilities); face-to-face time discussing  her lab results and symptoms, medications doses, her options of short and long term treatment based on the latest standards of care / guidelines;   and documenting the encounter.  Samantha [name]  participated in the discussions, expressed understanding, and voiced agreement with the above plans.  All questions were answered to her satisfaction. she is encouraged to contact clinic should she have any questions or concerns prior to her return visit.   Follow up  plan: Return in about 7 weeks (around 09/13/2023) for F/U with Pre-visit Labs.   Marquis Lunch, MD [facility] [address] [place], [place] [address] Phone: [phone]  Fax: [phone]     07/26/2023, 10:50 AM  This note was partially dictated with voice recognition software. Similar sounding words can be transcribed inadequately or may not  be corrected upon review.

## 2023-07-29 ENCOUNTER — Encounter (HOSPITAL_COMMUNITY): Payer: Self-pay | Admitting: *Deleted

## 2023-07-29 ENCOUNTER — Emergency Department (HOSPITAL_COMMUNITY)
Admission: EM | Admit: 2023-07-29 | Discharge: 2023-07-30 | Disposition: A | Discharge status: Home / Self Care | Payer: Medicare Other | Attending: Emergency Medicine | Admitting: Emergency Medicine

## 2023-07-29 ENCOUNTER — Other Ambulatory Visit: Payer: Self-pay

## 2023-07-29 ENCOUNTER — Emergency Department (HOSPITAL_COMMUNITY): Payer: Medicare Other

## 2023-07-29 DIAGNOSIS — Z7901 Long term (current) use of anticoagulants: Secondary | ICD-10-CM | POA: Insufficient documentation

## 2023-07-29 DIAGNOSIS — I129 Hypertensive chronic kidney disease with stage 1 through stage 4 chronic kidney disease, or unspecified chronic kidney disease: Secondary | ICD-10-CM | POA: Diagnosis not present

## 2023-07-29 DIAGNOSIS — G529 Cranial nerve disorder, unspecified: Secondary | ICD-10-CM

## 2023-07-29 DIAGNOSIS — H532 Diplopia: Secondary | ICD-10-CM | POA: Insufficient documentation

## 2023-07-29 DIAGNOSIS — Z7984 Long term (current) use of oral hypoglycemic drugs: Secondary | ICD-10-CM | POA: Insufficient documentation

## 2023-07-29 DIAGNOSIS — N183 Chronic kidney disease, stage 3 unspecified: Secondary | ICD-10-CM | POA: Diagnosis not present

## 2023-07-29 DIAGNOSIS — E119 Type 2 diabetes mellitus without complications: Secondary | ICD-10-CM | POA: Diagnosis not present

## 2023-07-29 DIAGNOSIS — Z7982 Long term (current) use of aspirin: Secondary | ICD-10-CM | POA: Insufficient documentation

## 2023-07-29 DIAGNOSIS — Z79899 Other long term (current) drug therapy: Secondary | ICD-10-CM | POA: Insufficient documentation

## 2023-07-29 DIAGNOSIS — D8682 Multiple cranial nerve palsies in sarcoidosis: Secondary | ICD-10-CM | POA: Insufficient documentation

## 2023-07-29 DIAGNOSIS — H538 Other visual disturbances: Secondary | ICD-10-CM | POA: Diagnosis present

## 2023-07-29 LAB — I-STAT CHEM 8, ED
BUN: 16 mg/dL (ref 8–23)
Calcium, Ion: 1.2 mmol/L (ref 1.15–1.40)
Chloride: 107 mmol/L (ref 98–111)
Creatinine, Ser: 1.3 mg/dL — ABNORMAL HIGH (ref 0.44–1.00)
Glucose, Bld: 165 mg/dL — ABNORMAL HIGH (ref 70–99)
HCT: 35 % — ABNORMAL LOW (ref 36.0–46.0)
Hemoglobin: 11.9 g/dL — ABNORMAL LOW (ref 12.0–15.0)
Potassium: 3.7 mmol/L (ref 3.5–5.1)
Sodium: 141 mmol/L (ref 135–145)
TCO2: 24 mmol/L (ref 22–32)

## 2023-07-29 LAB — COMPREHENSIVE METABOLIC PANEL
ALT: 12 U/L (ref 0–44)
AST: 16 U/L (ref 15–41)
Albumin: 3.5 g/dL (ref 3.5–5.0)
Alkaline Phosphatase: 101 U/L (ref 38–126)
Anion gap: 11 (ref 5–15)
BUN: 15 mg/dL (ref 8–23)
CO2: 24 mmol/L (ref 22–32)
Calcium: 9.6 mg/dL (ref 8.9–10.3)
Chloride: 106 mmol/L (ref 98–111)
Creatinine, Ser: 1.32 mg/dL — ABNORMAL HIGH (ref 0.44–1.00)
GFR, Estimated: 42 mL/min — ABNORMAL LOW (ref >60)
Glucose, Bld: 165 mg/dL — ABNORMAL HIGH (ref 70–99)
Potassium: 3.8 mmol/L (ref 3.5–5.1)
Sodium: 141 mmol/L (ref 135–145)
Total Bilirubin: 0.5 mg/dL (ref 0.0–1.2)
Total Protein: 7 g/dL (ref 6.5–8.1)

## 2023-07-29 LAB — CBC
HCT: 35.3 % — ABNORMAL LOW (ref 36.0–46.0)
Hemoglobin: 10.4 g/dL — ABNORMAL LOW (ref 12.0–15.0)
MCH: 23.4 pg — ABNORMAL LOW (ref 26.0–34.0)
MCHC: 29.5 g/dL — ABNORMAL LOW (ref 30.0–36.0)
MCV: 79.3 fL — ABNORMAL LOW (ref 80.0–100.0)
Platelets: 359 10*3/uL (ref 150–400)
RBC: 4.45 MIL/uL (ref 3.87–5.11)
RDW: 18.1 % — ABNORMAL HIGH (ref 11.5–15.5)
WBC: 5.9 10*3/uL (ref 4.0–10.5)
nRBC: 0 % (ref 0.0–0.2)

## 2023-07-29 LAB — ETHANOL: Alcohol, Ethyl (B): <10 mg/dL (ref <10)

## 2023-07-29 LAB — DIFFERENTIAL
Abs Immature Granulocytes: 0.01 10*3/uL (ref 0.00–0.07)
Basophils Absolute: 0 10*3/uL (ref 0.0–0.1)
Basophils Relative: 0 %
Eosinophils Absolute: 0.1 10*3/uL (ref 0.0–0.5)
Eosinophils Relative: 2 %
Immature Granulocytes: 0 %
Lymphocytes Relative: 35 %
Lymphs Abs: 2.1 10*3/uL (ref 0.7–4.0)
Monocytes Absolute: 0.4 10*3/uL (ref 0.1–1.0)
Monocytes Relative: 6 %
Neutro Abs: 3.4 10*3/uL (ref 1.7–7.7)
Neutrophils Relative %: 57 %

## 2023-07-29 LAB — URINALYSIS, ROUTINE W REFLEX MICROSCOPIC
Bacteria, UA: NONE SEEN
Bilirubin Urine: NEGATIVE
Glucose, UA: NEGATIVE mg/dL
Hgb urine dipstick: NEGATIVE
Ketones, ur: NEGATIVE mg/dL
Leukocytes,Ua: NEGATIVE
Nitrite: NEGATIVE
Protein, ur: 100 mg/dL — AB
Specific Gravity, Urine: 1.014 (ref 1.005–1.030)
pH: 6 (ref 5.0–8.0)

## 2023-07-29 LAB — PROTIME-INR
INR: 1.1 (ref 0.8–1.2)
Prothrombin Time: 14.2 s (ref 11.4–15.2)

## 2023-07-29 LAB — C-REACTIVE PROTEIN: CRP: 1.1 mg/dL — ABNORMAL HIGH (ref <1.0)

## 2023-07-29 LAB — CBG MONITORING, ED: Glucose-Capillary: 166 mg/dL — ABNORMAL HIGH (ref 70–99)

## 2023-07-29 LAB — APTT: aPTT: 33 s (ref 24–36)

## 2023-07-29 MED ORDER — LORAZEPAM 1 MG PO TABS
0.5000 mg | ORAL_TABLET | Freq: Once | ORAL | Status: AC
  Administered 2023-07-30: 0.5 mg via ORAL
  Filled 2023-07-29: qty 1

## 2023-07-29 NOTE — ED Triage Notes (Signed)
She fell 2 weeks ago and struck her head and she is on eliquis for af

## 2023-07-29 NOTE — ED Notes (Signed)
The pa had ordered a c-t head

## 2023-07-29 NOTE — ED Notes (Signed)
 To ct

## 2023-07-29 NOTE — ED Triage Notes (Signed)
The pt was having double vision since yesterday   the pt has a visual cut with her rt eye and she cannot follow to the rt side with her rt eye

## 2023-07-30 ENCOUNTER — Emergency Department (HOSPITAL_COMMUNITY): Payer: Medicare Other

## 2023-07-30 ENCOUNTER — Telehealth: Payer: Self-pay

## 2023-07-30 LAB — SEDIMENTATION RATE: Sed Rate: 24 mm/h — ABNORMAL HIGH (ref 0–22)

## 2023-07-30 NOTE — ED Provider Notes (Signed)
[place] EMERGENCY DEPARTMENT AT [facility] Provider Note   CSN: [id number] Arrival date & time: 07/29/23  2137     History  Chief Complaint  Patient presents with   Blurred Vision    Samantha [name] is a 78 y.o. female.  The history is provided by the patient and a relative.  Patient w/history of hypertension, diabetes, atrial fibrillation on anticoagulation presents with visual changes.  She reports approximately 24 hours ago she started noticing double vision and a visual field cut in the right eye.  She has had a mild headache but no other complaints.  No slurred speech or confusion.  No arm or leg weakness.  Reports distant history of cataract surgery Her symptoms continued throughout the day She was seen by her eye specialist this afternoon and was sent to the ER to rule out a stroke. She reports compliance with her anticoagulation  Patient reports she fell last month with minor head injury, had negative CT head Past Medical History:  Diagnosis Date   Allergic rhinitis    Arthritis of both knees    Arthritis of both shoulder regions    Bilateral carpal tunnel syndrome    Cataract    Chronic low back pain    CKD (chronic kidney disease), stage III (HCC)    Decreased vision    Diabetes mellitus    HTN (hypertension)    Hyperlipidemia    Morbid obesity (HCC)    Thyroid disease     Home Medications Prior to Admission medications   Medication Sig Start Date End Date Taking? Authorizing Provider  acetaminophen (TYLENOL) 500 MG tablet Take 500-1,000 mg by mouth every 6 (six) hours as needed for mild pain.    [provider]  apixaban (ELIQUIS) 5 MG TABS tablet Take 1 tablet (5 mg total) by mouth 2 (two) times daily. 02/01/23   Maretta Bees, MD  aspirin EC 81 MG tablet Take 81 mg by mouth daily. Swallow whole.    [provider]  diclofenac Sodium (VOLTAREN) 1 % GEL Apply 4 g topically 4 (four) times daily. 03/12/23   Philomena Doheny, MD  ferrous sulfate 325 (65 FE) MG tablet Take 1 tablet (325 mg total) by mouth daily with breakfast. 03/13/23   Colbert Coyer, Priscila, MD  folic acid (FOLVITE) 1 MG tablet Take 1 mg by mouth every morning. 06/01/23   [provider]  hydrALAZINE (APRESOLINE) 100 MG tablet Take 150 mg by mouth 2 (two) times daily. 1 1/2 tablet 06/29/23   [provider]  metFORMIN (GLUCOPHAGE-XR) 500 MG 24 hr tablet Take 1 tablet (500 mg total) by mouth 2 (two) times daily with a meal. Patient taking differently: Take 500 mg by mouth daily with breakfast. 03/12/23   Colbert Coyer, Alyson Locket, MD  methimazole (TAPAZOLE) 10 MG tablet Take 1 tablet (10 mg total) by mouth daily with breakfast. 07/26/23   Roma Kayser, MD  metoprolol tartrate (LOPRESSOR) 100 MG tablet Take 100 mg by mouth 2 (two) times daily. 06/21/23   [provider]  spironolactone (ALDACTONE) 25 MG tablet Take 0.5 tablets (12.5 mg total) by mouth daily. 07/06/23 10/04/23  Jonita Albee, PA-C  valsartan (DIOVAN) 160 MG tablet Take 1 tablet (160 mg total) by mouth daily. 07/06/23   Jonita Albee, PA-C      Allergies    Patient has no known allergies.    Review of Systems   Review of Systems  Constitutional:  Negative  for fever.  Eyes:  Positive for visual disturbance.  Neurological:  Positive for headaches. Negative for speech difficulty and weakness.    Physical Exam Updated Vital Signs BP (!) 186/95   Pulse (!) 55   Temp 98 F (36.7 C) (Oral)   Resp (!) 21   Ht 1.549 m (5\' 1" )   Wt 67.4 kg   SpO2 100%   BMI 28.08 kg/m  Physical Exam CONSTITUTIONAL: Elderly, no acute distress HEAD: Normocephalic/atraumatic, no temporal tenderness EYES: Pupils are equal and reactive to light.  ?Visual field cut in the right eye.  No nystagmus Difficulty looking laterally with right eye  No Corneal haziness or conjunctival injection Patient reports visual changes improve covering left eye ENMT:  Mucous membranes moist NECK: supple no meningeal signs, no bruits CV: S1/S2 noted, no murmurs/rubs/gallops noted LUNGS: Lungs are clear to auscultation bilaterally, no apparent distress NEURO:Awake/alert, face symmetric, no arm or leg drift is noted Equal 5/5 strength with shoulder abduction, elbow flex/extension, wrist flex/extension in upper extremities and equal hand grips bilaterally Equal 5/5 strength with hip flexion,knee flex/extension, foot dorsi/plantar flexion Cranial nerve VI palsy is noted Sensation to light touch intact in all extremities EXTREMITIES: pulses normal, full ROM SKIN: warm, color normal PSYCH: no abnormalities of mood noted   ED Results / Procedures / Treatments   Labs (all labs ordered are listed, but only abnormal results are displayed) Labs Reviewed  CBC - Abnormal; Notable for the following components:      Result Value   Hemoglobin 10.4 (*)    HCT 35.3 (*)    MCV 79.3 (*)    MCH 23.4 (*)    MCHC 29.5 (*)    RDW 18.1 (*)    All other components within normal limits  COMPREHENSIVE METABOLIC PANEL - Abnormal; Notable for the following components:   Glucose, Bld 165 (*)    Creatinine, Ser 1.32 (*)    GFR, Estimated 42 (*)    All other components within normal limits  URINALYSIS, ROUTINE W REFLEX MICROSCOPIC - Abnormal; Notable for the following components:   Protein, ur 100 (*)    All other components within normal limits  SEDIMENTATION RATE - Abnormal; Notable for the following components:   Sed Rate 24 (*)    All other components within normal limits  C-REACTIVE PROTEIN - Abnormal; Notable for the following components:   CRP 1.1 (*)    All other components within normal limits  I-STAT CHEM 8, ED - Abnormal; Notable for the following components:   Creatinine, Ser 1.30 (*)    Glucose, Bld 165 (*)    Hemoglobin 11.9 (*)    HCT 35.0 (*)    All other components within normal limits  CBG MONITORING, ED - Abnormal; Notable for the following  components:   Glucose-Capillary 166 (*)    All other components within normal limits  PROTIME-INR  APTT  DIFFERENTIAL  ETHANOL    EKG EKG Interpretation Date/Time:  Sunday July 29 2023 22:07:44 EST Ventricular Rate:  54 PR Interval:  128 QRS Duration:  76 QT Interval:  430 QTC Calculation: 407 R Axis:   56  Text Interpretation: Sinus bradycardia Cannot rule out Anterior infarct , age undetermined Abnormal ECG No significant change since last tracing Confirmed by Zadie Rhine ([id number]) on 07/29/2023 11:34:54 PM  Radiology MR BRAIN WO CONTRAST Result Date: 07/30/2023 CLINICAL DATA:  Initial evaluation for acute diplopia. EXAM: MRI HEAD WITHOUT CONTRAST MRA HEAD WITHOUT CONTRAST TECHNIQUE: Multiplanar, multi-echo pulse sequences of  the brain and surrounding structures were acquired without intravenous contrast. Angiographic images of the Circle of Willis were acquired using MRA technique without intravenous contrast. COMPARISON:  CT from 07/29/2023. FINDINGS: MRI HEAD FINDINGS Brain: Cerebral volume within normal limits. Scattered patchy T2/FLAIR hyperintensity seen involving the periventricular, deep, and subcortical white matter both cerebral hemispheres. Findings are nonspecific, but most commonly related to chronic microvascular ischemic disease. Changes are mild for age. No evidence for acute or subacute infarct. Gray-white matter differentiation maintained. No acute intracranial hemorrhage. Single punctate chronic microhemorrhage noted within the right cerebellum. No mass lesion, midline shift or mass effect. No hydrocephalus or extra-axial fluid collection. Pituitary gland and suprasellar region within normal limits. Vascular: Major intracranial vascular flow voids are maintained. Skull and upper cervical spine: Craniocervical junction within normal limits. Bone marrow signal intensity normal. No scalp soft tissue abnormality. Sinuses/Orbits: Prior bilateral ocular lens replacement.  Paranasal sinuses are largely clear. No mastoid effusion. Other: None. MRA HEAD FINDINGS Anterior circulation: Examination mildly degraded by motion. Both internal carotid arteries are patent through the siphons without significant stenosis or other abnormality. A1 segments patent bilaterally. Right A1 diminutive. Normal anterior communicating artery complex. Anterior cerebral arteries patent without significant stenosis. Left ACA dominant with bihemispheric supply. M1 segments widely patent. Distal MCA branches well perfused and symmetric. Posterior circulation: Visualized V4 segments widely patent. Partially visualized PICA patent. Basilar patent without stenosis. Superior cerebellar and posterior cerebral arteries patent bilaterally. Anatomic variants: As above.  No aneurysm. IMPRESSION: MRI HEAD: 1. No acute intracranial abnormality. 2. Mild cerebral white matter disease, nonspecific, but most commonly related to chronic microvascular ischemic disease. MRA HEAD: Negative intracranial MRA for large vessel occlusion or other emergent finding. No hemodynamically significant or correctable stenosis. Electronically Signed   By: Rise Mu M.D.   On: 07/30/2023 03:11   MR ANGIO HEAD WO CONTRAST Result Date: 07/30/2023 CLINICAL DATA:  Initial evaluation for acute diplopia. EXAM: MRI HEAD WITHOUT CONTRAST MRA HEAD WITHOUT CONTRAST TECHNIQUE: Multiplanar, multi-echo pulse sequences of the brain and surrounding structures were acquired without intravenous contrast. Angiographic images of the Circle of Willis were acquired using MRA technique without intravenous contrast. COMPARISON:  CT from 07/29/2023. FINDINGS: MRI HEAD FINDINGS Brain: Cerebral volume within normal limits. Scattered patchy T2/FLAIR hyperintensity seen involving the periventricular, deep, and subcortical white matter both cerebral hemispheres. Findings are nonspecific, but most commonly related to chronic microvascular ischemic disease.  Changes are mild for age. No evidence for acute or subacute infarct. Gray-white matter differentiation maintained. No acute intracranial hemorrhage. Single punctate chronic microhemorrhage noted within the right cerebellum. No mass lesion, midline shift or mass effect. No hydrocephalus or extra-axial fluid collection. Pituitary gland and suprasellar region within normal limits. Vascular: Major intracranial vascular flow voids are maintained. Skull and upper cervical spine: Craniocervical junction within normal limits. Bone marrow signal intensity normal. No scalp soft tissue abnormality. Sinuses/Orbits: Prior bilateral ocular lens replacement. Paranasal sinuses are largely clear. No mastoid effusion. Other: None. MRA HEAD FINDINGS Anterior circulation: Examination mildly degraded by motion. Both internal carotid arteries are patent through the siphons without significant stenosis or other abnormality. A1 segments patent bilaterally. Right A1 diminutive. Normal anterior communicating artery complex. Anterior cerebral arteries patent without significant stenosis. Left ACA dominant with bihemispheric supply. M1 segments widely patent. Distal MCA branches well perfused and symmetric. Posterior circulation: Visualized V4 segments widely patent. Partially visualized PICA patent. Basilar patent without stenosis. Superior cerebellar and posterior cerebral arteries patent bilaterally. Anatomic variants: As above.  No aneurysm.  IMPRESSION: MRI HEAD: 1. No acute intracranial abnormality. 2. Mild cerebral white matter disease, nonspecific, but most commonly related to chronic microvascular ischemic disease. MRA HEAD: Negative intracranial MRA for large vessel occlusion or other emergent finding. No hemodynamically significant or correctable stenosis. Electronically Signed   By: Rise Mu M.D.   On: 07/30/2023 03:11   CT HEAD WO CONTRAST Result Date: 07/29/2023 CLINICAL DATA:  Double vision. EXAM: CT HEAD WITHOUT  CONTRAST TECHNIQUE: Contiguous axial images were obtained from the base of the skull through the vertex without intravenous contrast. RADIATION DOSE REDUCTION: This exam was performed according to the departmental dose-optimization program which includes automated exposure control, adjustment of the mA and/or kV according to patient size and/or use of iterative reconstruction technique. COMPARISON:  07/07/2023 FINDINGS: Brain: No acute intracranial abnormality. Specifically, no hemorrhage, hydrocephalus, mass lesion, acute infarction, or significant intracranial injury. Mild age related volume loss. Vascular: No hyperdense vessel or unexpected calcification. Skull: No acute calvarial abnormality. Sinuses/Orbits: No acute findings Other: None IMPRESSION: No acute intracranial abnormality. Electronically Signed   By: Charlett Nose M.D.   On: 07/29/2023 23:02    Procedures Procedures    Medications Ordered in ED Medications  LORazepam (ATIVAN) tablet 0.5 mg (0.5 mg Oral Given 07/30/23 0038)    ED Course/ Medical Decision Making/ A&P Clinical Course as of 07/30/23 0425  Sun Jul 29, 2023  2335 Glucose(!): 165 Hyperglycemia [DW]  2335 Creatinine(!): 1.32 Renal insufficiency [DW]  Mon Jul 30, 2023  0008 Patient is had symptoms for over 24 hours of diplopia and reports visual field cut.  She reports already being seen by her eye specialist and sent for further evaluation for CVA. Sed rate is 24, low suspicion for temporal arteritis Will proceed with MRI brain [DW]  0424 Overall unremarkable including negative MRI Patient has remained stable.  No new changes.  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  Will be referred to neurology, and she already has an ophthalmologist that she can follow-up with. [DW]    Clinical Course User Index [DW] Zadie Rhine, MD                                 Medical Decision Making Amount and/or Complexity of Data Reviewed Labs:  Decision-making details  documented in ED Course. Radiology: ordered.  Risk Prescription drug management.   This patient presents to the ED for concern of visual changes, this involves an extensive number of treatment options, and is a complaint that carries with it a high risk of complications and morbidity.  The differential diagnosis includes but is not limited to CVA, glaucoma, temporal arteritis, aneurysm, cranial nerve palsy  Comorbidities that complicate the patient evaluation: Patient's presentation is complicated by their history of atrial fibrillation  Social Determinants of Health: Patient's  multiple ER visits   increases the complexity of managing their presentation  Additional history obtained: Additional history obtained from family Records reviewed previous admission documents  Lab Tests: I Ordered, and personally interpreted labs.  The pertinent results include: Renal insufficiency  Imaging Studies ordered: I ordered imaging studies including CT scan head and MRI brain   I independently visualized and interpreted imaging which showed no acute findings I agree with the radiologist interpretation   Reevaluation: After the interventions noted above, I reevaluated the patient and found that they have :improved  Complexity of problems addressed: Patient's presentation is most consistent with  acute presentation with potential  threat to life or bodily function  Disposition: After consideration of the diagnostic results and the patient's response to treatment,  I feel that the patent would benefit from discharge   .           Final Clinical Impression(s) / ED Diagnoses Final diagnoses:  Diplopia  Cranial nerve palsy    Rx / DC Orders ED Discharge Orders          Ordered    Ambulatory referral to Neurology       Comments: An appointment is requested in approximately: 2 weeks   07/30/23 0424              Zadie Rhine, MD 07/30/23 [id number]

## 2023-07-30 NOTE — Telephone Encounter (Signed)
Pt called stating she was seen in the ER yesterday due to experiencing headache and double vision. Pt states they did rule out stroke. It does look like from the note they have referred her to neurology. Pt wanted your opinion regarding if you felt her thyroid could contribute. Pt is taking methimazole 10mg  daily. Please advise.

## 2023-07-30 NOTE — ED Provider Notes (Incomplete)
[place] EMERGENCY DEPARTMENT AT [facility] Provider Note   CSN: [id number] Arrival date & time: 07/29/23  2137     History {Add pertinent medical, surgical, social history, OB history to HPI:1} Chief Complaint  Patient presents with  . Blurred Vision    Samantha [name] is a 78 y.o. female.  The history is provided by the patient and a relative.  Patient w/history of hypertension, diabetes, atrial fibrillation on anticoagulation presents with visual changes.  She reports approximately 24 hours ago she started noticing double vision and a visual field cut in the right eye.  She has had a mild headache but no other complaints.  No slurred speech or confusion.  No arm or leg weakness.  Reports distant history of cataract surgery Her symptoms continued throughout the day She was seen by her eye specialist this afternoon and was sent to the ER to rule out a stroke. She reports compliance with her anticoagulation   Past Medical History:  Diagnosis Date  . Allergic rhinitis   . Arthritis of both knees   . Arthritis of both shoulder regions   . Bilateral carpal tunnel syndrome   . Cataract   . Chronic low back pain   . CKD (chronic kidney disease), stage III (HCC)   . Decreased vision   . Diabetes mellitus   . HTN (hypertension)   . Hyperlipidemia   . Morbid obesity (HCC)   . Thyroid disease     Home Medications Prior to Admission medications   Medication Sig Start Date End Date Taking? Authorizing Provider  acetaminophen (TYLENOL) 500 MG tablet Take 500-1,000 mg by mouth every 6 (six) hours as needed for mild pain.    [provider]  apixaban (ELIQUIS) 5 MG TABS tablet Take 1 tablet (5 mg total) by mouth 2 (two) times daily. 02/01/23   Maretta Bees, MD  aspirin EC 81 MG tablet Take 81 mg by mouth daily. Swallow whole.    [provider]  diclofenac Sodium (VOLTAREN) 1 % GEL Apply 4 g topically 4 (four) times daily. 03/12/23   Philomena Doheny, MD  ferrous sulfate 325 (65 FE) MG tablet Take 1 tablet (325 mg total) by mouth daily with breakfast. 03/13/23   Colbert Coyer, Priscila, MD  folic acid (FOLVITE) 1 MG tablet Take 1 mg by mouth every morning. 06/01/23   [provider]  hydrALAZINE (APRESOLINE) 100 MG tablet Take 150 mg by mouth 2 (two) times daily. 1 1/2 tablet 06/29/23   [provider]  metFORMIN (GLUCOPHAGE-XR) 500 MG 24 hr tablet Take 1 tablet (500 mg total) by mouth 2 (two) times daily with a meal. Patient taking differently: Take 500 mg by mouth daily with breakfast. 03/12/23   Colbert Coyer, Alyson Locket, MD  methimazole (TAPAZOLE) 10 MG tablet Take 1 tablet (10 mg total) by mouth daily with breakfast. 07/26/23   Roma Kayser, MD  metoprolol tartrate (LOPRESSOR) 100 MG tablet Take 100 mg by mouth 2 (two) times daily. 06/21/23   [provider]  spironolactone (ALDACTONE) 25 MG tablet Take 0.5 tablets (12.5 mg total) by mouth daily. 07/06/23 10/04/23  Jonita Albee, PA-C  valsartan (DIOVAN) 160 MG tablet Take 1 tablet (160 mg total) by mouth daily. 07/06/23   Jonita Albee, PA-C      Allergies    Patient has no known allergies.    Review of Systems   Review of Systems  Constitutional:  Negative for fever.  Eyes:  Positive for visual disturbance.  Neurological:  Positive for headaches. Negative for speech difficulty and weakness.    Physical Exam Updated Vital Signs BP (!) 188/91 (BP Location: Right Arm)   Pulse (!) 57   Temp 98.1 F (36.7 C) (Oral)   Resp 18   Ht 1.549 m (5\' 1" )   Wt 67.4 kg   SpO2 100%   BMI 28.08 kg/m  Physical Exam CONSTITUTIONAL: Elderly, no acute distress HEAD: Normocephalic/atraumatic EYES: EOMI/PERRL, no nystagmus, no visual field deficit  no ptosis*** ENMT: Mucous membranes moist NECK: supple no meningeal signs, no bruits*** CV: S1/S2 noted, no murmurs/rubs/gallops noted LUNGS: Lungs are clear to auscultation bilaterally, no  apparent distress ABDOMEN: soft, nontender, no rebound or guarding GU:no cva tenderness NEURO:Awake/alert, face symmetric, no arm or leg drift is noted Equal 5/5 strength with shoulder abduction, elbow flex/extension, wrist flex/extension in upper extremities and equal hand grips bilaterally*** Equal 5/5 strength with hip flexion,knee flex/extension, foot dorsi/plantar flexion Cranial nerves 3/4/5/6/12/18/08/11/12 tested and intact Gait normal without ataxia No past pointing Sensation to light touch intact in all extremities*** NIHSS=*** EXTREMITIES: pulses normal, full ROM SKIN: warm, color normal PSYCH: no abnormalities of mood noted   ED Results / Procedures / Treatments   Labs (all labs ordered are listed, but only abnormal results are displayed) Labs Reviewed  CBC - Abnormal; Notable for the following components:      Result Value   Hemoglobin 10.4 (*)    HCT 35.3 (*)    MCV 79.3 (*)    MCH 23.4 (*)    MCHC 29.5 (*)    RDW 18.1 (*)    All other components within normal limits  COMPREHENSIVE METABOLIC PANEL - Abnormal; Notable for the following components:   Glucose, Bld 165 (*)    Creatinine, Ser 1.32 (*)    GFR, Estimated 42 (*)    All other components within normal limits  URINALYSIS, ROUTINE W REFLEX MICROSCOPIC - Abnormal; Notable for the following components:   Protein, ur 100 (*)    All other components within normal limits  C-REACTIVE PROTEIN - Abnormal; Notable for the following components:   CRP 1.1 (*)    All other components within normal limits  I-STAT CHEM 8, ED - Abnormal; Notable for the following components:   Creatinine, Ser 1.30 (*)    Glucose, Bld 165 (*)    Hemoglobin 11.9 (*)    HCT 35.0 (*)    All other components within normal limits  CBG MONITORING, ED - Abnormal; Notable for the following components:   Glucose-Capillary 166 (*)    All other components within normal limits  PROTIME-INR  APTT  DIFFERENTIAL  ETHANOL  SEDIMENTATION RATE     EKG EKG Interpretation Date/Time:  Sunday July 29 2023 22:07:44 EST Ventricular Rate:  54 PR Interval:  128 QRS Duration:  76 QT Interval:  430 QTC Calculation: 407 R Axis:   56  Text Interpretation: Sinus bradycardia Cannot rule out Anterior infarct , age undetermined Abnormal ECG No significant change since last tracing Confirmed by Zadie Rhine ([id number]) on 07/29/2023 11:34:54 PM  Radiology CT HEAD WO CONTRAST Result Date: 07/29/2023 CLINICAL DATA:  Double vision. EXAM: CT HEAD WITHOUT CONTRAST TECHNIQUE: Contiguous axial images were obtained from the base of the skull through the vertex without intravenous contrast. RADIATION DOSE REDUCTION: This exam was performed according to the departmental dose-optimization program which includes automated exposure control, adjustment of the mA and/or kV according to patient size and/or use of iterative reconstruction  technique. COMPARISON:  07/07/2023 FINDINGS: Brain: No acute intracranial abnormality. Specifically, no hemorrhage, hydrocephalus, mass lesion, acute infarction, or significant intracranial injury. Mild age related volume loss. Vascular: No hyperdense vessel or unexpected calcification. Skull: No acute calvarial abnormality. Sinuses/Orbits: No acute findings Other: None IMPRESSION: No acute intracranial abnormality. Electronically Signed   By: Charlett Nose M.D.   On: 07/29/2023 23:02    Procedures Procedures  {Document cardiac monitor, telemetry assessment procedure when appropriate:1}  Medications Ordered in ED Medications  LORazepam (ATIVAN) tablet 0.5 mg (has no administration in time range)    ED Course/ Medical Decision Making/ A&P Clinical Course as of 07/30/23 0000  Sun Jul 29, 2023  2335 Glucose(!): 165 Hyperglycemia [DW]  2335 Creatinine(!): 1.32 Renal insufficiency [DW]    Clinical Course User Index [DW] Zadie Rhine, MD   {   Click here for ABCD2, HEART and other calculatorsREFRESH Note before  signing :1}                              Medical Decision Making Amount and/or Complexity of Data Reviewed Labs:  Decision-making details documented in ED Course. Radiology: ordered.  Risk Prescription drug management.   ***  {Document critical care time when appropriate:1} {Document review of labs and clinical decision tools ie heart score, Chads2Vasc2 etc:1}  {Document your independent review of radiology images, and any outside records:1} {Document your discussion with family members, caretakers, and with consultants:1} {Document social determinants of health affecting pt's care:1} {Document your decision making why or why not admission, treatments were needed:1} Final Clinical Impression(s) / ED Diagnoses Final diagnoses:  None    Rx / DC Orders ED Discharge Orders     None

## 2023-07-31 NOTE — Telephone Encounter (Signed)
 Left a message requesting pt return call to the office.

## 2023-07-31 NOTE — Telephone Encounter (Signed)
Spoke with pt advising her that her thyroid condition was not a contributor to her symptoms and to follow the recommendations of the neurologist once she is seen per Dr.Nida. Pt voiced understanding.

## 2023-08-07 NOTE — Progress Notes (Signed)
 Cardiology Office Note:  .   Date:  08/14/2023  ID:  Samantha [name], Samantha [name] 07/22/1945, MRN [id number] PCP: Samantha Aldo, NP  [facility] Providers Cardiologist:  Charlton Haws, MD Cardiology APP:  Samantha Dory, NP   History of Present Illness: Samantha [name] Kitchen   Samantha [name] is a 78 y.o. female with a past medical history of palpitations/SVT, paroxysmal atrial fibrillation, HTN, HLD, type 2 DM, CKD stage IIIb, hyperthyroidism s/p iodine ablation in 11/2022, chronic back pain. Patient is followed by Samantha [name] and presents today for a 6 week follow up appointment.    Patient previously had a nuclear stress test in 05/2020 that was a low risk study without evidence of ischemia or infarction. 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 She was referred to endocrinology and underwent neuroactive iodine thyroid ablation in 11/2022. She was later admitted from 8/13--02/01/23 after she presented for evaluation of altered mental status and weakness. Upon arrival to the ED, she was tachycardic and BP was intermittently soft. EKG showed sinus tachycardia. She was admitted for acute metabolic encephalopathy due to AKI and hypernatremia. She was treated with IV fluids. While admitted, patient developed atrial fibrillation with RVR. She was started on IV diltiazem drip and was given IV lopressor. Cardiology was consulted. Found to have TSH low at 0.028 and free T4 elevated to 17.5. Echocardiogram on 01/24/23 showed EF >75%, no regional wall motion abnormalities, mild LVH, grade I DD, normal RV function. There was elevated LVOT peak velocity of 4 m/s, likely due to hyperdynamic LV function and chordal SAM. Patient converted to NSR and was discharged on eliquis 5 mg BID, metoprolol tartrate 50 mg BID.    Patient was last seen by Samantha [name] on 04/23/23. At that time, her BP was well controlled. She remained in NSR on lopressor and eliquis. Of note, she was not completed compliant with  methimazole or beta blocker. Instructed to be compliant with her medications and follow up with endocrinology.   I saw patient on 07/06/23. At that time, patient's BP was elevated at home, oven to the 160s-170s systolic. She also reported that her thyroid was still overactive despite medication adjustments. She was started on spironolactone 12.5 mg daily and was transitioned from losartan to valsartan. EKG in clinic showed NSR. Remained on eliquis.   Today, patient presents for HTN follow up. Despite medication adjustments, her home blood pressure readings remain elevated, typically in the 140s/70s-80s. Previously, her BP had been in the 160s-170s systolic, so BP has improved a bit. She reports no associated symptoms such as chest pain, palpitations, or swelling. No dizziness, syncope, near syncope. She has a history of atrial fibrillation, denies known recurrences/symptoms.   Approximately three weeks ago, she developed double vision. She has sought care from both an optometrist and an ophthalmologist. The ophthalmologist suggested that the issue may resolve on its own and mentioned the possibility of surgery, but did not recommend immediate intervention. Her hyperthyroidism is managed by an endocrinologist, who recently decreased her methimazole dosage due to improvement. However, she may require another round of radioactive iodine treatment. She reports no pain or abnormalities with her thyroid.  The patient also has a history of proteinuria and early-stage chronic kidney disease. She expresses concern about her kidney function due to a family history of dialysis. Her PCP regularly checks her kidney function and it has been stable   ROS: per HPI   Studies Reviewed: .   Cardiac Studies & Procedures  ______________________________________________________________________________________________   STRESS TESTS  MYOCARDIAL PERFUSION IMAGING 05/27/2020  Narrative  Nuclear stress EF: 74%. The left  ventricular ejection fraction is hyperdynamic (>65%).  The patient walked on a standard Bruce protocol treadmill test for 4 minutes.  She achieved a peak heart rate of 162 which is 110% predicted maximal heart rate.  At peak exercise there were no ST or T wave changes to suggest ischemia. There was no QRS widening at peak exercise.  This is a low risk study. The patient has fair exercise capacity. There is no evidence of infarction or ischemia.   ECHOCARDIOGRAM  ECHOCARDIOGRAM COMPLETE 01/24/2023  Narrative ECHOCARDIOGRAM REPORT    Patient Name:   Samantha [name] Date of Exam: 01/24/2023 Medical Rec #:  [id number]   Height:       61.0 in Accession #:    [id number]  Weight:       140.2 lb Date of Birth:  1945-09-18   BSA:          1.624 m Patient Age:    77 years    BP:           142/79 mmHg Patient Gender: F           HR:           122 bpm. Exam Location:  Inpatient  Procedure: 2D Echo, Cardiac Doppler and Color Doppler  Indications:    Elevated troponin  History:        Patient has no prior history of Echocardiogram examinations. CKD, Signs/Symptoms:Fatigue; Risk Factors:Dyslipidemia, Hypertension and Diabetes. Sepsis.  Sonographer:    Wallie Char Referring Phys: Skip Mayer, A  IMPRESSIONS   1. Elevated LVOT peak velocity of 4 m/s likely due to hyperdynamic LV function and chordal SAM. 2. Left ventricular ejection fraction, by estimation, is >75%. The left ventricle has hyperdynamic function. The left ventricle has no regional wall motion abnormalities. There is mild concentric left ventricular hypertrophy. Left ventricular diastolic parameters are consistent with Grade I diastolic dysfunction (impaired relaxation). 3. Right ventricular systolic function is normal. The right ventricular size is normal. 4. The mitral valve is normal in structure. No evidence of mitral valve regurgitation. No evidence of mitral stenosis. 5. The aortic valve is normal in structure.  Aortic valve regurgitation is not visualized. No aortic stenosis is present. 6. The inferior vena cava is dilated in size with >50% respiratory variability, suggesting right atrial pressure of 8 mmHg.  Comparison(s): No prior Echocardiogram.  FINDINGS Left Ventricle: Left ventricular ejection fraction, by estimation, is >75%. The left ventricle has hyperdynamic function. The left ventricle has no regional wall motion abnormalities. The left ventricular internal cavity size was normal in size. There is mild concentric left ventricular hypertrophy. Left ventricular diastolic parameters are consistent with Grade I diastolic dysfunction (impaired relaxation).  Right Ventricle: The right ventricular size is normal. Right ventricular systolic function is normal.  Left Atrium: Left atrial size was normal in size.  Right Atrium: Right atrial size was normal in size.  Pericardium: Trivial pericardial effusion is present.  Mitral Valve: The mitral valve is normal in structure. No evidence of mitral valve regurgitation. No evidence of mitral valve stenosis. MV peak gradient, 6.2 mmHg. The mean mitral valve gradient is 3.0 mmHg.  Tricuspid Valve: The tricuspid valve is normal in structure. Tricuspid valve regurgitation is mild . No evidence of tricuspid stenosis.  Aortic Valve: The aortic valve is normal in structure. Aortic valve regurgitation is not visualized. No aortic stenosis is present.  Aortic valve mean gradient measures 10.0 mmHg. Aortic valve peak gradient measures 17.3 mmHg. Aortic valve area, by VTI measures 1.57 cm.  Pulmonic Valve: The pulmonic valve was normal in structure. Pulmonic valve regurgitation is not visualized. No evidence of pulmonic stenosis.  Aorta: The aortic root is normal in size and structure.  Venous: The inferior vena cava is dilated in size with greater than 50% respiratory variability, suggesting right atrial pressure of 8 mmHg.  IAS/Shunts: No atrial level  shunt detected by color flow Doppler.  Additional Comments: Elevated LVOT peak velocity of 4 m/s likely due to hyperdynamic LV function and chordal SAM.   LEFT VENTRICLE PLAX 2D LVIDd:         3.50 cm     Diastology LVIDs:         2.00 cm     LV e' medial:    6.81 cm/s LV PW:         0.90 cm     LV E/e' medial:  13.4 LV IVS:        0.70 cm     LV e' lateral:   6.73 cm/s LVOT diam:     1.50 cm     LV E/e' lateral: 13.6 LV SV:         45 LV SV Index:   28 LVOT Area:     1.77 cm  LV Volumes (MOD) LV vol d, MOD A2C: 42.3 ml LV vol d, MOD A4C: 57.2 ml LV vol s, MOD A2C: 11.1 ml LV vol s, MOD A4C: 12.6 ml LV SV MOD A2C:     31.2 ml LV SV MOD A4C:     57.2 ml LV SV MOD BP:      37.5 ml  RIGHT VENTRICLE             IVC RV Basal diam:  2.90 cm     IVC diam: 2.30 cm RV S prime:     21.00 cm/s TAPSE (M-mode): 2.1 cm  LEFT ATRIUM             Index        RIGHT ATRIUM          Index LA diam:        3.20 cm 1.97 cm/m   RA Area:     8.47 cm LA Vol (A2C):   36.6 ml 22.54 ml/m  RA Volume:   15.80 ml 9.73 ml/m LA Vol (A4C):   37.9 ml 23.34 ml/m LA Biplane Vol: 39.4 ml 24.26 ml/m AORTIC VALVE AV Area (Vmax):    1.60 cm AV Area (Vmean):   1.55 cm AV Area (VTI):     1.57 cm AV Vmax:           208.00 cm/s AV Vmean:          145.500 cm/s AV VTI:            0.288 m AV Peak Grad:      17.3 mmHg AV Mean Grad:      10.0 mmHg LVOT Vmax:         188.00 cm/s LVOT Vmean:        127.500 cm/s LVOT VTI:          0.256 m LVOT/AV VTI ratio: 0.89  MITRAL VALVE                TRICUSPID VALVE MV Area (PHT): 3.89 cm     TR Peak grad:   36.5 mmHg MV  Area VTI:   2.18 cm     TR Vmax:        302.00 cm/s MV Peak grad:  6.2 mmHg MV Mean grad:  3.0 mmHg     SHUNTS MV Vmax:       1.24 m/s     Systemic VTI:  0.26 m MV Vmean:      74.9 cm/s    Systemic Diam: 1.50 cm MV Decel Time: 195 msec MV E velocity: 91.30 cm/s MV A velocity: 136.00 cm/s MV E/A ratio:  0.67  Olga Millers  MD Electronically signed by Olga Millers MD Signature Date/Time: 01/24/2023/10:45:00 AM    Final          ______________________________________________________________________________________________      Risk Assessment/Calculations:    CHA2DS2-VASc Score = 5   This indicates a 7.2% annual risk of stroke. The patient's score is based upon: CHF History: 0 HTN History: 1 Diabetes History: 1 Stroke History: 0 Vascular Disease History: 0 Age Score: 2 Gender Score: 1       Physical Exam:   VS:  BP 138/70   Pulse 60   Ht 5\' 1"  (1.549 m)   Wt 147 lb (66.7 kg)   SpO2 95%   BMI 27.78 kg/m    Wt Readings from Last 3 Encounters:  08/14/23 147 lb (66.7 kg)  07/29/23 148 lb 9.4 oz (67.4 kg)  07/26/23 148 lb 9.6 oz (67.4 kg)    GEN: Well nourished, well developed in no acute distress. Sitting comfortably in the chair  NECK: No JVD. No obvious thyroid masses on palpation  CARDIAC:  RRR, no murmurs, rubs, gallops. Radial pulses 2+ bilaterally  RESPIRATORY:  Clear to auscultation without rales, wheezing or rhonchi. Normal WOB on room air   ABDOMEN: Soft, non-tender, non-distended EXTREMITIES:  No edema in BLE; No deformity   ASSESSMENT AND PLAN: .    HTN  - Patient's BP has improved, but is still often in the 140s systolic. Had previously been in the 160s systolic, so BP is improving with medication adjustments  - Increase valsartan to 240 mg daily  - Continue hydralazine 150 mg BID, metoprolol tartrate 100 mg BID, spironolactone 12.5 mg daily  - K 3.8, creatinine 1.32 on 07/29/23  - Patient reports having CKD and proteinuria. Discussed that ARBs are good for these patients. PCP monitors blood work. She has an appointment with her PCP later this month, labs will be checked at that time. Asked patient to have her PCP fax [place] her lab results   PAF  - Patient first diagnosed with PAF in 01/2023, had recurrence in 02/2023. Note, episodes occurred in the setting of uncontrolled  hyperthyroidism  - Patient denies palpitations, fluttering/racing heart. HR regular on exam today. - Continue eliquis 5 mg BID. Denies bleeding on eliquis  - continue metoprolol tartrate 100 mg BID   Hyperthyroidism  - Followed by endocrinology. Previously had neuroactive iodine thyroid ablation in 11/2022. Last seen in 07/2023. Considering repeat RAI treatment  - On methimazole   CKD stage IIIb - Creatinine 1.32 in 07/2023 - As above, her PCP is checking her labs later this month   Type 2 DM  - A1c 7.1 in 02/2023  - Followed by PCP    Dispo: Follow up in 3 months with me   Signed, Jonita Albee, PA-C

## 2023-08-14 ENCOUNTER — Encounter: Payer: Self-pay | Admitting: Cardiology

## 2023-08-14 ENCOUNTER — Ambulatory Visit: Payer: Medicare Other | Attending: Cardiology | Admitting: Cardiology

## 2023-08-14 VITALS — BP 138/70 | HR 60 | Ht 61.0 in | Wt 147.0 lb

## 2023-08-14 DIAGNOSIS — N1832 Chronic kidney disease, stage 3b: Secondary | ICD-10-CM

## 2023-08-14 DIAGNOSIS — I480 Paroxysmal atrial fibrillation: Secondary | ICD-10-CM | POA: Diagnosis not present

## 2023-08-14 DIAGNOSIS — I10 Essential (primary) hypertension: Secondary | ICD-10-CM

## 2023-08-14 DIAGNOSIS — Z794 Long term (current) use of insulin: Secondary | ICD-10-CM

## 2023-08-14 DIAGNOSIS — E119 Type 2 diabetes mellitus without complications: Secondary | ICD-10-CM

## 2023-08-14 DIAGNOSIS — E0590 Thyrotoxicosis, unspecified without thyrotoxic crisis or storm: Secondary | ICD-10-CM

## 2023-08-14 MED ORDER — VALSARTAN 80 MG PO TABS: 80.0000 mg | ORAL_TABLET | Freq: Every day | ORAL | 3 refills | Status: DC

## 2023-08-14 NOTE — Patient Instructions (Signed)
 Medication Instructions:  Increase Valsartan to 240 mg once a day *If you need a refill on your cardiac medications before your next appointment, please call your pharmacy*  Lab Work: No labs If you have labs (blood work) drawn today and your tests are completely normal, you will receive your results only by: MyChart Message (if you have MyChart) OR A paper copy in the mail If you have any lab test that is abnormal or we need to change your treatment, we will call you to review the results.  Testing/Procedures: No testing  Follow-Up: At [facility], you and your health needs are our priority.  As part of our continuing mission to provide you with exceptional heart care, we have created designated Provider Care Teams.  These Care Teams include your primary Cardiologist (physician) and Advanced Practice Providers (APPs -  Physician Assistants and Nurse Practitioners) who all work together to provide you with the care you need, when you need it.  We recommend signing up for the patient portal called "MyChart".  Sign up information is provided on this After Visit Summary.  MyChart is used to connect with patients for Virtual Visits (Telemedicine).  Patients are able to view lab/test results, encounter notes, upcoming appointments, etc.  Non-urgent messages can be sent to your provider as well.   To learn more about what you can do with MyChart, go to [email or web address].    Your next appointment:   3 month(s)  Provider:   Robet Leu PA  Other Instructions Please have you PCP forward over your lab results.

## 2023-08-15 ENCOUNTER — Ambulatory Visit: Payer: Medicare Other | Admitting: Diagnostic Neuroimaging

## 2023-08-15 ENCOUNTER — Encounter: Payer: Self-pay | Admitting: Diagnostic Neuroimaging

## 2023-08-15 VITALS — BP 148/83 | HR 66 | Ht 61.0 in | Wt 148.0 lb

## 2023-08-15 DIAGNOSIS — I1A0 Resistant hypertension: Secondary | ICD-10-CM | POA: Diagnosis not present

## 2023-08-15 DIAGNOSIS — I739 Peripheral vascular disease, unspecified: Secondary | ICD-10-CM

## 2023-08-15 DIAGNOSIS — H4921 Sixth [abducent] nerve palsy, right eye: Secondary | ICD-10-CM

## 2023-08-15 NOTE — Patient Instructions (Signed)
  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

## 2023-08-15 NOTE — Progress Notes (Signed)
 [facility]  PATIENT: Samantha [name] DOB: 08-Mar-1946  REFERRING CLINICIAN: Zadie Rhine, MD HISTORY FROM: patient  REASON FOR VISIT: new consult   HISTORICAL  CHIEF COMPLAINT:  Chief Complaint  Patient presents with   Diplopia    Rm 6 alone Pt is well, reports she has had double vision for 3 weeks. She has more trouble with her R eye than L.     HISTORY OF PRESENT ILLNESS:   78 year old female presenting with double vision.  History of hypertension, diabetes, hypothyroidism, atrial fibrillation.  07/07/2023 patient fell down at home.  Went to the emergency room for evaluation and CT scan was normal.  On 07/28/2023 started having vision changes, with some double vision when she looks towards the right side.  Went to her optometrist, then ophthalmologist, then she went to the emergency room for evaluation.  Blood pressure was 186/95.  MRI was negative for acute stroke.  Since that time patient is stable with her symptoms.   REVIEW OF SYSTEMS: Full 14 system review of systems performed and negative with exception of: as per HPI.  ALLERGIES: No Known Allergies  HOME MEDICATIONS: Outpatient Medications Prior to Visit  Medication Sig Dispense Refill   acetaminophen (TYLENOL) 500 MG tablet Take 500-1,000 mg by mouth every 6 (six) hours as needed for mild pain.     apixaban (ELIQUIS) 5 MG TABS tablet Take 1 tablet (5 mg total) by mouth 2 (two) times daily. 60 tablet 1   folic acid (FOLVITE) 1 MG tablet Take 1 mg by mouth every morning.     hydrALAZINE (APRESOLINE) 100 MG tablet Take 150 mg by mouth 2 (two) times daily. 1 1/2 tablet     metFORMIN (GLUCOPHAGE-XR) 500 MG 24 hr tablet Take 1 tablet (500 mg total) by mouth 2 (two) times daily with a meal. (Patient taking differently: Take 500 mg by mouth daily with breakfast.) 60 tablet 0   methimazole (TAPAZOLE) 10 MG tablet Take 1 tablet (10 mg total) by mouth daily with breakfast. 90 tablet 1   metoprolol  tartrate (LOPRESSOR) 100 MG tablet Take 100 mg by mouth 2 (two) times daily.     spironolactone (ALDACTONE) 25 MG tablet Take 0.5 tablets (12.5 mg total) by mouth daily. 45 tablet 3   valsartan (DIOVAN) 160 MG tablet Take 1 tablet (160 mg total) by mouth daily. 30 tablet 2   valsartan (DIOVAN) 80 MG tablet Take 1 tablet (80 mg total) by mouth daily. 90 tablet 3   aspirin EC 81 MG tablet Take 81 mg by mouth daily. Swallow whole. (Patient not taking: Reported on 08/15/2023)     diclofenac Sodium (VOLTAREN) 1 % GEL Apply 4 g topically 4 (four) times daily. (Patient not taking: Reported on 08/15/2023) 400 g 0   ferrous sulfate 325 (65 FE) MG tablet Take 1 tablet (325 mg total) by mouth daily with breakfast. (Patient not taking: Reported on 08/15/2023) 30 tablet 0   No facility-administered medications prior to visit.    PAST MEDICAL HISTORY: Past Medical History:  Diagnosis Date   Allergic rhinitis    Arthritis of both knees    Arthritis of both shoulder regions    Bilateral carpal tunnel syndrome    Cataract    Chronic low back pain    CKD (chronic kidney disease), stage III (HCC)    Decreased vision    Diabetes mellitus    HTN (hypertension)    Hyperlipidemia    Morbid obesity (HCC)  Thyroid disease     PAST SURGICAL HISTORY: Past Surgical History:  Procedure Laterality Date   CATARACT EXTRACTION     COLONOSCOPY     COLONOSCOPY N/A 09/15/2020   Procedure: COLONOSCOPY;  Surgeon: Corbin Ade, MD;  [facility];  Service: Endoscopy;  Laterality: N/A;  ASA II / PM procedure   POLYPECTOMY  09/15/2020   Procedure: POLYPECTOMY;  Surgeon: Corbin Ade, MD;  [facility];  Service: Endoscopy;;   TUBAL LIGATION      FAMILY HISTORY: Family History  Problem Relation Age of Onset   Hypertension Father    Hypertension Mother    Diabetes Mother    Breast cancer Mother    Hypertension Sister    Cancer Sister        pancreatic   Cancer Sister        pancreatic    Liver disease Sister     SOCIAL HISTORY: Social History   Socioeconomic History   Marital status: Widowed    Spouse name: Not on file   Number of children: Not on file   Years of education: Not on file   Highest education level: Not on file  Occupational History   Not on file  Tobacco Use   Smoking status: Never   Smokeless tobacco: Never  Vaping Use   Vaping status: Never Used  Substance and Sexual Activity   Alcohol use: Yes    Comment: occasionally   Drug use: No   Sexual activity: Yes    Birth control/protection: Surgical    Comment: tubal  Other Topics Concern   Not on file  Social History Narrative   Not on file   Social Drivers of Health   Financial Resource Strain: Low Risk  (11/16/2022)   Overall Financial Resource Strain (CARDIA)    Difficulty of Paying Living Expenses: Not very hard  Food Insecurity: No Food Insecurity (03/11/2023)   Hunger Vital Sign    Worried About Running Out of Food in the Last Year: Never true    Ran Out of Food in the Last Year: Never true  Transportation Needs: No Transportation Needs (03/11/2023)   PRAPARE - Administrator, Civil Service (Medical): No    Lack of Transportation (Non-Medical): No  Physical Activity: Inactive (11/16/2022)   Exercise Vital Sign    Days of Exercise per Week: 0 days    Minutes of Exercise per Session: 0 min  Stress: No Stress Concern Present (11/16/2022)   [employer] of Occupational Health - Occupational Stress Questionnaire    Feeling of Stress : Only a little  Social Connections: Moderately Integrated (11/16/2022)   Social Connection and Isolation Panel [NHANES]    Frequency of Communication with Friends and Family: Three times a week    Frequency of Social Gatherings with Friends and Family: Once a week    Attends Religious Services: More than 4 times per year    Active Member of [employer] or Organizations: Yes    Attends Banker Meetings: 1 to 4 times per year    Marital  Status: Widowed  Intimate Partner Violence: Not At Risk (03/11/2023)   Humiliation, Afraid, Rape, and Kick questionnaire    Fear of Current or Ex-Partner: No    Emotionally Abused: No    Physically Abused: No    Sexually Abused: No     PHYSICAL EXAM  GENERAL EXAM/CONSTITUTIONAL: Vitals:  Vitals:   08/15/23 0921  BP: (!) 148/83  Pulse: 66  Weight: 148 lb (67.1 kg)  Height: 5\' 1"  (1.549 m)   Body mass index is 27.96 kg/m. Wt Readings from Last 3 Encounters:  08/15/23 148 lb (67.1 kg)  08/14/23 147 lb (66.7 kg)  07/29/23 148 lb 9.4 oz (67.4 kg)   Patient is in no distress; well developed, nourished and groomed; neck is supple  CARDIOVASCULAR: Examination of carotid arteries is normal; no carotid bruits Regular rate and rhythm, no murmurs Examination of peripheral vascular system by observation and palpation is normal  EYES: Ophthalmoscopic exam of optic discs and posterior segments is normal; no papilledema or hemorrhages Vision Screening   Right eye Left eye Both eyes  Without correction 20/50 20/100 20/200  With correction       MUSCULOSKELETAL: Gait, strength, tone, movements noted in Neurologic exam below  NEUROLOGIC: MENTAL STATUS:      No data to display         awake, alert, oriented to person, place and time recent and remote memory intact normal attention and concentration language fluent, comprehension intact, naming intact fund of knowledge appropriate  CRANIAL NERVE:  2nd - no papilledema on fundoscopic exam 2nd, 3rd, 4th, 6th - pupils equal and reactive to light, visual fields full to confrontation, extraocular muscles intact, EXCEPT RIGHT EYE CANNOT ABDUCT PAST MIDLINE ON RIGHT GAZE; no nystagmus 5th - facial sensation symmetric 7th - facial strength symmetric 8th - hearing intact 9th - palate elevates symmetrically, uvula midline 11th - shoulder shrug symmetric 12th - tongue protrusion midline  MOTOR:  normal bulk and tone, full  strength in the BUE, BLE  SENSORY:  normal and symmetric to light touch, temperature, vibration  COORDINATION:  finger-nose-finger, fine finger movements normal  REFLEXES:  deep tendon reflexes TRACE and symmetric  GAIT/STATION:  narrow based gait     DIAGNOSTIC DATA (LABS, IMAGING, TESTING) - I reviewed patient records, labs, notes, testing and imaging myself where available.  Lab Results  Component Value Date   WBC 5.9 07/29/2023   HGB 10.4 (L) 07/29/2023   HCT 35.3 (L) 07/29/2023   MCV 79.3 (L) 07/29/2023   PLT 359 07/29/2023      Component Value Date/Time   NA 141 07/29/2023 2211   NA 142 07/24/2023 1531   K 3.8 07/29/2023 2211   CL 106 07/29/2023 2211   CO2 24 07/29/2023 2211   GLUCOSE 165 (H) 07/29/2023 2211   BUN 15 07/29/2023 2211   BUN 17 07/24/2023 1531   CREATININE 1.32 (H) 07/29/2023 2211   CALCIUM 9.6 07/29/2023 2211   PROT 7.0 07/29/2023 2211   PROT 6.6 07/24/2023 1531   ALBUMIN 3.5 07/29/2023 2211   ALBUMIN 4.1 07/24/2023 1531   AST 16 07/29/2023 2211   ALT 12 07/29/2023 2211   ALKPHOS 101 07/29/2023 2211   BILITOT 0.5 07/29/2023 2211   BILITOT 0.2 07/24/2023 1531   GFRNONAA 42 (L) 07/29/2023 2211   GFRAA 55 (L) 05/08/2018 2247   Lab Results  Component Value Date   CHOL 166 02/09/2009   HDL 60 02/09/2009   LDLCALC 89 02/09/2009   TRIG 86 02/09/2009   CHOLHDL 2.8 Ratio 02/09/2009   Lab Results  Component Value Date   HGBA1C 7.1 (H) 03/08/2023   Lab Results  Component Value Date   VITAMINB12 406 02/10/2009   Lab Results  Component Value Date   TSH 3.780 07/24/2023     MRI HEAD: 1. No acute intracranial abnormality. 2. Mild cerebral white matter disease, nonspecific, but most commonly related to  chronic microvascular ischemic disease.   MRA HEAD: Negative intracranial MRA for large vessel occlusion or other emergent finding. No hemodynamically significant or correctable stenosis.   ASSESSMENT AND PLAN  79 y.o. year old  female here with:  Dx:  1. Right abducens nerve palsy   2. Small vessel disease (HCC)   3. Resistant hypertension       PLAN:  Right CN6 palsy (suspect microvascular infarction to abducens nerve in setting of HTN and diabetes) - continue medical mgmt (eliquis for afib, BP meds, DM meds; lipid mgmt per PCP, currently statin intolerant) - use eye patch, alternate eyes - follow up with ophthalmology   Return for return to PCP, pending if symptoms worsen or fail to improve.    Suanne Marker, MD 08/15/2023, 10:31 AM Certified in Neurology, Neurophysiology and Neuroimaging  [facility] [address], [address] [place], [place] [address] [phone]

## 2023-09-13 LAB — COMPREHENSIVE METABOLIC PANEL WITH GFR
ALT: 6 IU/L (ref 0–32)
AST: 16 IU/L (ref 0–40)
Albumin: 4.3 g/dL (ref 3.8–4.8)
Alkaline Phosphatase: 138 IU/L — ABNORMAL HIGH (ref 44–121)
BUN/Creatinine Ratio: 15 (ref 12–28)
BUN: 17 mg/dL (ref 8–27)
Bilirubin Total: 0.3 mg/dL (ref 0.0–1.2)
CO2: 21 mmol/L (ref 20–29)
Calcium: 9.7 mg/dL (ref 8.7–10.3)
Chloride: 106 mmol/L (ref 96–106)
Creatinine, Ser: 1.1 mg/dL — ABNORMAL HIGH (ref 0.57–1.00)
Globulin, Total: 2.5 g/dL (ref 1.5–4.5)
Glucose: 88 mg/dL (ref 70–99)
Potassium: 4.2 mmol/L (ref 3.5–5.2)
Sodium: 142 mmol/L (ref 134–144)
Total Protein: 6.8 g/dL (ref 6.0–8.5)
eGFR: 52 mL/min/{1.73_m2} — ABNORMAL LOW (ref >59)

## 2023-09-13 LAB — T3, FREE: T3, Free: 3.7 pg/mL (ref 2.0–4.4)

## 2023-09-13 LAB — T4, FREE: Free T4: 1.57 ng/dL (ref 0.82–1.77)

## 2023-09-13 LAB — TSH: TSH: 0.15 u[IU]/mL — ABNORMAL LOW (ref 0.450–4.500)

## 2023-09-14 ENCOUNTER — Ambulatory Visit: Payer: Medicare Other | Admitting: "Endocrinology

## 2023-09-14 ENCOUNTER — Encounter: Payer: Self-pay | Admitting: "Endocrinology

## 2023-09-14 VITALS — BP 146/82 | HR 68 | Ht 61.0 in | Wt 150.2 lb

## 2023-09-14 DIAGNOSIS — E119 Type 2 diabetes mellitus without complications: Secondary | ICD-10-CM

## 2023-09-14 DIAGNOSIS — Z7984 Long term (current) use of oral hypoglycemic drugs: Secondary | ICD-10-CM | POA: Diagnosis not present

## 2023-09-14 DIAGNOSIS — E0590 Thyrotoxicosis, unspecified without thyrotoxic crisis or storm: Secondary | ICD-10-CM

## 2023-09-14 DIAGNOSIS — I10 Essential (primary) hypertension: Secondary | ICD-10-CM

## 2023-09-14 MED ORDER — METHIMAZOLE 10 MG PO TABS: 10.0000 mg | ORAL_TABLET | Freq: Every day | ORAL | 1 refills | Status: DC

## 2023-09-14 NOTE — Progress Notes (Signed)
 09/14/2023, 12:35 PM   Endocrinology follow-up note  Subjective:    Patient ID: Samantha [name], female    DOB: December 01, 1945, PCP Hillery Aldo, NP   Past Medical History:  Diagnosis Date   Allergic rhinitis    Arthritis of both knees    Arthritis of both shoulder regions    Bilateral carpal tunnel syndrome    Cataract    Chronic low back pain    CKD (chronic kidney disease), stage III (HCC)    Decreased vision    Diabetes mellitus    HTN (hypertension)    Hyperlipidemia    Morbid obesity (HCC)    Thyroid disease    Past Surgical History:  Procedure Laterality Date   CATARACT EXTRACTION     COLONOSCOPY     COLONOSCOPY N/A 09/15/2020   Procedure: COLONOSCOPY;  Surgeon: Corbin Ade, MD;  [facility];  Service: Endoscopy;  Laterality: N/A;  ASA II / PM procedure   POLYPECTOMY  09/15/2020   Procedure: POLYPECTOMY;  Surgeon: Corbin Ade, MD;  [facility];  Service: Endoscopy;;   TUBAL LIGATION     Social History   Socioeconomic History   Marital status: Widowed    Spouse name: Not on file   Number of children: Not on file   Years of education: Not on file   Highest education level: Not on file  Occupational History   Not on file  Tobacco Use   Smoking status: Never   Smokeless tobacco: Never  Vaping Use   Vaping status: Never Used  Substance and Sexual Activity   Alcohol use: Yes    Comment: occasionally   Drug use: No   Sexual activity: Yes    Birth control/protection: Surgical    Comment: tubal  Other Topics Concern   Not on file  Social History Narrative   Not on file   Social Drivers of Health   Financial Resource Strain: Low Risk  (11/16/2022)   Overall Financial Resource Strain (CARDIA)    Difficulty of Paying Living Expenses: Not very hard  Food Insecurity: No Food Insecurity (03/11/2023)   Hunger Vital Sign    Worried About Running Out of Food in the Last Year: Never true     Ran Out of Food in the Last Year: Never true  Transportation Needs: No Transportation Needs (03/11/2023)   PRAPARE - Administrator, Civil Service (Medical): No    Lack of Transportation (Non-Medical): No  Physical Activity: Inactive (11/16/2022)   Exercise Vital Sign    Days of Exercise per Week: 0 days    Minutes of Exercise per Session: 0 min  Stress: No Stress Concern Present (11/16/2022)   [employer] of Occupational Health - Occupational Stress Questionnaire    Feeling of Stress : Only a little  Social Connections: Moderately Integrated (11/16/2022)   Social Connection and Isolation Panel [NHANES]    Frequency of Communication with Friends and Family: Three times a week    Frequency of Social Gatherings with Friends and Family: Once a week    Attends Religious Services: More than 4 times per year    Active Member of Clubs or Organizations: Yes  Attends Banker Meetings: 1 to 4 times per year    Marital Status: Widowed   Family History  Problem Relation Age of Onset   Hypertension Father    Hypertension Mother    Diabetes Mother    Breast cancer Mother    Hypertension Sister    Cancer Sister        pancreatic   Cancer Sister        pancreatic   Liver disease Sister    Outpatient Encounter Medications as of 09/14/2023  Medication Sig   acetaminophen (TYLENOL) 500 MG tablet Take 500-1,000 mg by mouth every 6 (six) hours as needed for mild pain.   apixaban (ELIQUIS) 5 MG TABS tablet Take 1 tablet (5 mg total) by mouth 2 (two) times daily.   folic acid (FOLVITE) 1 MG tablet Take 1 mg by mouth every morning.   hydrALAZINE (APRESOLINE) 100 MG tablet Take 150 mg by mouth 2 (two) times daily. 1 1/2 tablet   metFORMIN (GLUCOPHAGE-XR) 500 MG 24 hr tablet Take 1 tablet (500 mg total) by mouth 2 (two) times daily with a meal. (Patient taking differently: Take 500 mg by mouth daily with breakfast.)   methimazole (TAPAZOLE) 10 MG tablet Take 1 tablet (10 mg  total) by mouth daily with breakfast.   metoprolol tartrate (LOPRESSOR) 100 MG tablet Take 100 mg by mouth 2 (two) times daily.   spironolactone (ALDACTONE) 25 MG tablet Take 0.5 tablets (12.5 mg total) by mouth daily.   valsartan (DIOVAN) 160 MG tablet Take 1 tablet (160 mg total) by mouth daily.   valsartan (DIOVAN) 80 MG tablet Take 1 tablet (80 mg total) by mouth daily.   [DISCONTINUED] methimazole (TAPAZOLE) 10 MG tablet Take 1 tablet (10 mg total) by mouth daily with breakfast.   No facility-administered encounter medications on file as of 09/14/2023.   ALLERGIES: No Known Allergies   VACCINATION STATUS: Immunization History  Administered Date(s) Administered   H1N1 06/30/2008   Influenza Whole 03/12/2006, 06/03/2007   Moderna Sars-Covid-2 Vaccination 07/20/2019, 08/20/2019   Pneumococcal Polysaccharide-23 01/22/2007    HPI Samantha [name] is 78 y.o. female who presents today with a medical history as above. she is being seen in follow-up after she was seen in consultation for hyperthyroidism requested by Hillery Aldo, NP.   She was diagnosed with primary hypothyroidism.  On thyroid uptake and scan in May 2024.  After reviewing her options, she was treated with radioactive iodine treatment on November 28, 2022.  Her subsequent thyroid function tests did not confirm treatment response.  She was never initiated on thyroid hormone supplement due to the fact that she did not develop hypothyroidism. During her recent hospitalization for unrelated condition, she was found to have persistently elevated thyroid hormone and undetectable TSH.   She was initiated on methimazole with clinical response, currently 10 mg p.o. daily at breakfast.  She presents with thyroid hormone controlled to target, still suppressed TSH.  She reports taking her methimazole consistently. As it was the case even prior to her first treatment, she denies symptoms specifically denying palpitations, tremors, heat or cold  intolerance.  She denies dysphagia, shortness of breath, nor voice change.   She denies family history of thyroid problems.   She does not have  family history of thyroid malignancy.  She presents with 5 pounds of weight gain.    Her other medical problems include hypertension on treatment with amlodipine and lisinopril.  She remains on metformin 500 mg p.o. once  a day with breakfast for type 2 diabetes with recent A1c around 7%.     Review of Systems  Constitutional: +mildly fluctuating body weight with recent weight gain, no fatigue, no subjective hyperthermia, no subjective hypothermia Eyes: no blurry vision, no xerophthalmia  Neurological: no tremors, no numbness, no tingling, no dizziness Psychiatric: no depression,  Objective:       09/14/2023    9:33 AM 08/15/2023    9:21 AM 08/14/2023    3:19 PM  Vitals with BMI  Height 5\' 1"  5\' 1"    Weight 150 lbs 3 oz 148 lbs   BMI 28.39 27.98   Systolic 146 148 161  Diastolic 82 83 70  Pulse 68 66     BP (!) 146/82   Pulse 68   Ht 5\' 1"  (1.549 m)   Wt 150 lb 3.2 oz (68.1 kg)   BMI 28.38 kg/m   Wt Readings from Last 3 Encounters:  09/14/23 150 lb 3.2 oz (68.1 kg)  08/15/23 148 lb (67.1 kg)  08/14/23 147 lb (66.7 kg)    Physical Exam  Constitutional:  Body mass index is 28.38 kg/m.,  not in acute distress, + anxious state of mind.  She ambulates with a walker due to disequilibrium. Eyes: PERRLA, EOMI, no exophthalmos    CMP ( most recent) CMP     Component Value Date/Time   NA 142 09/12/2023 1627   K 4.2 09/12/2023 1627   CL 106 09/12/2023 1627   CO2 21 09/12/2023 1627   GLUCOSE 88 09/12/2023 1627   GLUCOSE 165 (H) 07/29/2023 2211   BUN 17 09/12/2023 1627   CREATININE 1.10 (H) 09/12/2023 1627   CALCIUM 9.7 09/12/2023 1627   PROT 6.8 09/12/2023 1627   ALBUMIN 4.3 09/12/2023 1627   AST 16 09/12/2023 1627   ALT 6 09/12/2023 1627   ALKPHOS 138 (H) 09/12/2023 1627   BILITOT 0.3 09/12/2023 1627   GFRNONAA 42 (L)  07/29/2023 2211   GFRAA 55 (L) 05/08/2018 2247     Diabetic Labs (most recent): Lab Results  Component Value Date   HGBA1C 7.1 (H) 03/08/2023   HGBA1C 9.8 (H) 01/23/2023   HGBA1C 8.7 (A) 01/09/2023   MICROALBUR 1.54 02/09/2009   MICROALBUR 0.64 01/16/2008   MICROALBUR 1.69 12/03/2006     Lipid Panel ( most recent) Lipid Panel     Component Value Date/Time   CHOL 166 02/09/2009 2026   TRIG 86 02/09/2009 2026   HDL 60 02/09/2009 2026   CHOLHDL 2.8 Ratio 02/09/2009 2026   VLDL 17 02/09/2009 2026   LDLCALC 89 02/09/2009 2026      Lab Results  Component Value Date   TSH 0.150 (L) 09/12/2023   TSH 3.780 07/24/2023   TSH <0.005 (L) 06/19/2023   TSH <0.005 (L) 03/28/2023   TSH <0.010 (L) 03/07/2023   FREET4 1.57 09/12/2023   FREET4 0.49 (L) 07/24/2023   FREET4 2.04 (H) 06/19/2023   FREET4 1.94 (H) 03/28/2023   FREET4 >5.50 (H) 03/07/2023     Thyroid uptake and scan on Oct 20, 2022 FINDINGS: Unremarkable LEFT thyroid lobe.   Enlarged RIGHT thyroid lobe containing warm and cold nodules.   Patient has previously undergone biopsy of an indeterminate nodule in the RIGHT lobe.   4 hour I-123 uptake = 29.9% (normal 5-20%), 24 hour I-123 uptake = 59.4% (normal 10-30%)   IMPRESSION: Enlarged multinodular thyroid gland especially RIGHT lobe. Elevated 4 hour and 24 hour radio iodine uptakes consistent with hyperthyroidism.  Nuclear medicine  radioactive iodine therapy for hyperthyroidism on November 28, 2022 IMPRESSION: Per oral administration of I-131 sodium iodide for the treatment of hyperthyroidism.    Assessment & Plan:   1. Hyperthyroidism 2.  Toxic multinodular goiter 3.  Type 2 diabetes  -After appropriate workup showed primary hyperthyroidism as a result of toxic multinodular goiter, she was treated with radioactive iodine treatment on November 28, 2022.  Her subsequent thyroid function tests were consistent with likely treatment failure.  She is currently  responding to methimazole 10 mg p.o. daily.  She reports better consistency taking her medications this time.   It is likely that she will need repeat RAI treatment after reasonable control of her thyroid towards target.  She will be given another 16-month before making that decision.   Complications of untreated hyperthyroidism were discussed in detail with her including the risk of thyroid storm.  -She is also on metoprolol 50 mg p.o. twice daily, advised to continue. -If she presents with persistent hyperthyroidism, she will be considered for repeat thyroid uptake and scan in preparation for repeat RAI thyroid ablation. -She is advised to follow-up with her PMD for diabetes care, currently on metformin 500 mg p.o. once a day with recent A1c around 7%, advised to continue.   She does have uncontrolled hypertension, advised to continue hydralazine 100 mg 3 times daily, metoprolol 100 mg p.o. twice daily, spironolactone 12.5 mg p.o. daily, valsartan 160 mg p.o. daily.  - she is advised to maintain close follow up with Hillery Aldo, NP for primary care needs.   I spent  22  minutes in the care of the patient today including review of labs from Thyroid Function, CMP, and other relevant labs ; imaging/biopsy records (current and previous including abstractions from other facilities); face-to-face time discussing  her lab results and symptoms, medications doses, her options of short and long term treatment based on the latest standards of care / guidelines;   and documenting the encounter.  Samantha [name]  participated in the discussions, expressed understanding, and voiced agreement with the above plans.  All questions were answered to her satisfaction. she is encouraged to contact clinic should she have any questions or concerns prior to her return visit.   Follow up plan: Return in about 4 months (around 01/14/2024) for Fasting Labs  in AM B4 8.   Marquis Lunch, MD [facility] [address] [place], [place] [address] Phone: [phone]  Fax: [phone]     09/14/2023, 12:35 PM  This note was partially dictated with voice recognition software. Similar sounding words can be transcribed inadequately or may not  be corrected upon review.

## 2023-10-04 ENCOUNTER — Other Ambulatory Visit: Payer: Self-pay | Admitting: Cardiology

## 2023-10-18 ENCOUNTER — Emergency Department (HOSPITAL_COMMUNITY)
Admission: EM | Admit: 2023-10-18 | Discharge: 2023-10-18 | Disposition: A | Discharge status: Home / Self Care | Attending: Emergency Medicine | Admitting: Emergency Medicine

## 2023-10-18 ENCOUNTER — Other Ambulatory Visit: Payer: Self-pay

## 2023-10-18 ENCOUNTER — Encounter (HOSPITAL_COMMUNITY): Payer: Self-pay

## 2023-10-18 ENCOUNTER — Emergency Department (HOSPITAL_COMMUNITY)

## 2023-10-18 DIAGNOSIS — Z7901 Long term (current) use of anticoagulants: Secondary | ICD-10-CM | POA: Insufficient documentation

## 2023-10-18 DIAGNOSIS — S0003XA Contusion of scalp, initial encounter: Secondary | ICD-10-CM | POA: Insufficient documentation

## 2023-10-18 DIAGNOSIS — I129 Hypertensive chronic kidney disease with stage 1 through stage 4 chronic kidney disease, or unspecified chronic kidney disease: Secondary | ICD-10-CM | POA: Diagnosis not present

## 2023-10-18 DIAGNOSIS — N189 Chronic kidney disease, unspecified: Secondary | ICD-10-CM | POA: Diagnosis not present

## 2023-10-18 DIAGNOSIS — E1122 Type 2 diabetes mellitus with diabetic chronic kidney disease: Secondary | ICD-10-CM | POA: Diagnosis not present

## 2023-10-18 DIAGNOSIS — W19XXXA Unspecified fall, initial encounter: Secondary | ICD-10-CM

## 2023-10-18 DIAGNOSIS — W01198A Fall on same level from slipping, tripping and stumbling with subsequent striking against other object, initial encounter: Secondary | ICD-10-CM | POA: Diagnosis not present

## 2023-10-18 DIAGNOSIS — M545 Low back pain, unspecified: Secondary | ICD-10-CM | POA: Insufficient documentation

## 2023-10-18 DIAGNOSIS — R519 Headache, unspecified: Secondary | ICD-10-CM | POA: Diagnosis present

## 2023-10-18 NOTE — ED Provider Notes (Signed)
 [facility]-EMERGENCY DEPT [facility] Emergency Department Provider Note MRN:  [id number]  Arrival date & time: 10/18/23     Chief Complaint   Fall   History of Present Illness   Samantha [name] is a 78 y.o. year-old female with a history of CKD, hypertension, diabetes presenting to the ED with chief complaint of head trauma.  Tripped and fell and hit her head on the edge of a table.  Has a bump on the back of her head.  Complaining of mild soreness to this area.  Also mild soreness to the lower back from falling.  Ambulating without issue, denies any other aches or pains.  Takes Eliquis .  Review of Systems  A thorough review of systems was obtained and all systems are negative except as noted in the HPI and PMH.   Patient's Health History    Past Medical History:  Diagnosis Date   Allergic rhinitis    Arthritis of both knees    Arthritis of both shoulder regions    Bilateral carpal tunnel syndrome    Cataract    Chronic low back pain    CKD (chronic kidney disease), stage III (HCC)    Decreased vision    Diabetes mellitus    HTN (hypertension)    Hyperlipidemia    Morbid obesity (HCC)    Thyroid  disease     Past Surgical History:  Procedure Laterality Date   CATARACT EXTRACTION     COLONOSCOPY     COLONOSCOPY N/A 09/15/2020   Procedure: COLONOSCOPY;  Surgeon: Suzette Espy, MD;  Location: [facility] ENDO SUITE;  Service: Endoscopy;  Laterality: N/A;  ASA II / PM procedure   POLYPECTOMY  09/15/2020   Procedure: POLYPECTOMY;  Surgeon: Suzette Espy, MD;  Location: [facility] ENDO SU[facility];  Service: Endoscopy;;   TUBAL LIGATION      Family History  Problem Relation Age of Onset   Hypertension Father    Hypertension Mother    Diabetes Mother    Breast cancer Mother    Hypertension Sister    Cancer Sister        pancreatic   Cancer Sister        pancreatic   Liver disease Sister     Social History   Socioeconomic History   Marital status: Widowed    Spouse name: Not on file    Number of children: Not on file   Years of education: Not on file   Highest education level: Not on file  Occupational History   Not on file  Tobacco Use   Smoking status: Never   Smokeless tobacco: Never  Vaping Use   Vaping status: Never Used  Substance and Sexual Activity   Alcohol use: Yes    Comment: occasionally   Drug use: No   Sexual activity: Yes    Birth control/protection: Surgical    Comment: tubal  Other Topics Concern   Not on file  Social History Narrative   Not on file   Social Drivers of Health   Financial Resource Strain: Low Risk  (11/16/2022)   Overall Financial Resource Strain (CARDIA)    Difficulty of Paying Living Expenses: Not very hard  Food Insecurity: No Food Insecurity (03/11/2023)   Hunger Vital Sign    Worried About Running Out of Food in the Last Year: Never true    Ran Out of Food in the Last Year: Never true  Transportation Needs: No Transportation Needs (03/11/2023)   PRAPARE - Transportation  Lack of Transportation (Medical): No    Lack of Transportation (Non-Medical): No  Physical Activity: Inactive (11/16/2022)   Exercise Vital Sign    Days of Exercise per Week: 0 days    Minutes of Exercise per Session: 0 min  Stress: No Stress Concern Present (11/16/2022)   [employer] of Occupational Health - Occupational Stress Questionnaire    Feeling of Stress : Only a little  Social Connections: Moderately Integrated (11/16/2022)   Social Connection and Isolation Panel [NHANES]    Frequency of Communication with Friends and Family: Three times a week    Frequency of Social Gatherings with Friends and Family: Once a week    Attends Religious Services: More than 4 times per year    Active Member of [employer] or Organizations: Yes    Attends Banker Meetings: 1 to 4 times per year    Marital Status: Widowed  Intimate Partner Violence: Not At Risk (03/11/2023)   Humiliation, Afraid, Rape, and Kick questionnaire    Fear of Current or  Ex-Partner: No    Emotionally Abused: No    Physically Abused: No    Sexually Abused: No     Physical Exam   Vitals:   10/18/23 1343  BP: (!) 174/77  Pulse: 65  Resp: 18  Temp: 97.7 F (36.5 C)  SpO2: 100%    CONSTITUTIONAL: Well-appearing, NAD NEURO/PSYCH:  Alert and oriented x 3, no focal deficits EYES:  eyes equal and reactive ENT/NECK:  no LAD, no JVD CARDIO: Regular rate, well-perfused, normal S1 and S2 PULM:  CTAB no wheezing or rhonchi GI/GU:  non-distended, non-tender MSK/SPINE:  No gross deformities, no edema SKIN:  no rash, atraumatic   *Additional and/or pertinent findings included in MDM below  Diagnostic and Interventional Summary    EKG Interpretation Date/Time:    Ventricular Rate:    PR Interval:    QRS Duration:    QT Interval:    QTC Calculation:   R Axis:      Text Interpretation:         Labs Reviewed - No data to display  CT HEAD WO CONTRAST ( )  Final Result      Medications - No data to display   Procedures  /  Critical Care Procedures  ED Course and Medical Decision Making  Initial Impression and Ddx Small hematoma to the left occiput, anticoagulated, obtaining CT head.  Otherwise no tenderness to the cervical spine, normal range of motion of the neck, no chest pain or shortness of breath, lungs clear and present, really no back tenderness, doubt fracture, doubt other significant injuries.  Past medical/surgical history that increases complexity of ED encounter: On Eliquis   Interpretation of Diagnostics I personally reviewed the CT head and my interpretation is as follows: No bleeding or other emergent findings    Patient Reassessment and Ultimate Disposition/Management     Discharge  Patient management required discussion with the following services or consulting groups:  None  Complexity of Problems Addressed Acute illness or injury that poses threat of life of bodily function  Additional Data Reviewed and  Analyzed Further history obtained from: None  Additional Factors Impacting ED Encounter Risk Consideration of hospitalization  Merrick Abe. Harless Lien, MD [facility] Emergency Medicine [facility] [email or web address]  Final Clinical Impressions(s) / ED Diagnoses     ICD-10-CM   1. Fall, initial encounter  W19.XXXA     2. Hematoma of scalp, initial encounter  S00.03XA  ED Discharge Orders     None        Discharge Instructions Discussed with and Provided to Patient:     Discharge Instructions      You were evaluated in the Emergency Department and after careful evaluation, we did not find any emergent condition requiring admission or further testing in the hospital.  Your exam/testing today is overall reassuring.  CT scanning did not show any significant injuries.  Can use Tylenol  at home for any lingering discomfort.  Please return to the Emergency Department if you experience any worsening of your condition.   Thank you for allowing us  to be a part of your care.     Edson Graces, MD 10/18/23 [phone]

## 2023-10-18 NOTE — ED Triage Notes (Signed)
 Pt arrived via POV c/o posterior head injury from a fall earlier today. Pt reports she missed the chair while trying to sit and hit the back of her head on the edge of the chair. Pt reports she landed flat on her back on the floor, and does endorse lower back pain as well. Pt denies LOC, denies visual impairment. Pt does take Eliquis .

## 2023-10-18 NOTE — Discharge Instructions (Signed)
 You were evaluated in the Emergency Department and after careful evaluation, we did not find any emergent condition requiring admission or further testing in the hospital.  Your exam/testing today is overall reassuring.  CT scanning did not show any significant injuries.  Can use Tylenol  at home for any lingering discomfort.  Please return to the Emergency Department if you experience any worsening of your condition.   Thank you for allowing us  to be a part of your care.

## 2023-11-09 ENCOUNTER — Other Ambulatory Visit: Payer: Self-pay

## 2023-11-09 ENCOUNTER — Emergency Department (HOSPITAL_COMMUNITY)
Admission: EM | Admit: 2023-11-09 | Discharge: 2023-11-09 | Disposition: A | Discharge status: Home / Self Care | Attending: Emergency Medicine | Admitting: Emergency Medicine

## 2023-11-09 ENCOUNTER — Encounter (HOSPITAL_COMMUNITY): Payer: Self-pay

## 2023-11-09 ENCOUNTER — Emergency Department (HOSPITAL_COMMUNITY)

## 2023-11-09 DIAGNOSIS — R0789 Other chest pain: Secondary | ICD-10-CM | POA: Diagnosis not present

## 2023-11-09 DIAGNOSIS — R079 Chest pain, unspecified: Secondary | ICD-10-CM | POA: Diagnosis present

## 2023-11-09 DIAGNOSIS — Z7901 Long term (current) use of anticoagulants: Secondary | ICD-10-CM | POA: Diagnosis not present

## 2023-11-09 LAB — COMPREHENSIVE METABOLIC PANEL WITH GFR
ALT: 9 U/L (ref 0–44)
AST: 16 U/L (ref 15–41)
Albumin: 3.7 g/dL (ref 3.5–5.0)
Alkaline Phosphatase: 106 U/L (ref 38–126)
Anion gap: 13 (ref 5–15)
BUN: 16 mg/dL (ref 8–23)
CO2: 22 mmol/L (ref 22–32)
Calcium: 9.1 mg/dL (ref 8.9–10.3)
Chloride: 102 mmol/L (ref 98–111)
Creatinine, Ser: 1.05 mg/dL — ABNORMAL HIGH (ref 0.44–1.00)
GFR, Estimated: 55 mL/min — ABNORMAL LOW (ref >60)
Glucose, Bld: 143 mg/dL — ABNORMAL HIGH (ref 70–99)
Potassium: 3.7 mmol/L (ref 3.5–5.1)
Sodium: 137 mmol/L (ref 135–145)
Total Bilirubin: 0.7 mg/dL (ref 0.0–1.2)
Total Protein: 7.4 g/dL (ref 6.5–8.1)

## 2023-11-09 LAB — CBC WITH DIFFERENTIAL/PLATELET
Abs Immature Granulocytes: 0.02 10*3/uL (ref 0.00–0.07)
Basophils Absolute: 0 10*3/uL (ref 0.0–0.1)
Basophils Relative: 0 %
Eosinophils Absolute: 0 10*3/uL (ref 0.0–0.5)
Eosinophils Relative: 0 %
HCT: 38.2 % (ref 36.0–46.0)
Hemoglobin: 11.3 g/dL — ABNORMAL LOW (ref 12.0–15.0)
Immature Granulocytes: 0 %
Lymphocytes Relative: 11 %
Lymphs Abs: 0.9 10*3/uL (ref 0.7–4.0)
MCH: 24 pg — ABNORMAL LOW (ref 26.0–34.0)
MCHC: 29.6 g/dL — ABNORMAL LOW (ref 30.0–36.0)
MCV: 81.3 fL (ref 80.0–100.0)
Monocytes Absolute: 0.5 10*3/uL (ref 0.1–1.0)
Monocytes Relative: 6 %
Neutro Abs: 6.6 10*3/uL (ref 1.7–7.7)
Neutrophils Relative %: 83 %
Platelets: 262 10*3/uL (ref 150–400)
RBC: 4.7 MIL/uL (ref 3.87–5.11)
RDW: 15.5 % (ref 11.5–15.5)
WBC: 8.1 10*3/uL (ref 4.0–10.5)
nRBC: 0 % (ref 0.0–0.2)

## 2023-11-09 LAB — TROPONIN I (HIGH SENSITIVITY)
Troponin I (High Sensitivity): 3 ng/L (ref <18)
Troponin I (High Sensitivity): 3 ng/L

## 2023-11-09 LAB — D-DIMER, QUANTITATIVE: D-Dimer, Quant: 0.38 ug{FEU}/mL (ref 0.00–0.50)

## 2023-11-09 MED ORDER — ACETAMINOPHEN 325 MG PO TABS
650.0000 mg | ORAL_TABLET | Freq: Once | ORAL | Status: AC
  Administered 2023-11-09: 650 mg via ORAL
  Filled 2023-11-09: qty 2

## 2023-11-09 NOTE — Discharge Instructions (Addendum)
 If you develop recurrent, continued, or worsening chest pain, shortness of breath, fever, vomiting, abdominal or back pain, or any other new/concerning symptoms then return to the ER for evaluation.

## 2023-11-09 NOTE — ED Triage Notes (Signed)
 Pt arrived via POV c/o sternal chest pain and SOB that began this morning. Pt endorses some dizziness as well.

## 2023-11-09 NOTE — ED Provider Notes (Signed)
 [place] EMERGENCY DEPARTMENT AT [facility] Provider Note   CSN: [id number] Arrival date & time: 11/09/23  1227     History  Chief Complaint  Patient presents with   Chest Pain    Samantha [name] is a 78 y.o. female.  HPI 78 year old female presents with right-sided chest pain.  Symptoms started around 8 AM.  Goes up into her right neck.  No dyspnea but pain worsens with inspiration.  It also worsens with sneezing or movements.  No exertional component.  She felt nauseated 1 time but otherwise no vomiting, abdominal pain or diaphoresis.  Has not take anything for the pain.  No recent illness.  No leg swelling.  She has been compliant with her Eliquis  which she states she takes for A-fib.  Home Medications Prior to Admission medications   Medication Sig Start Date End Date Taking? Authorizing Provider  acetaminophen  (TYLENOL ) 500 MG tablet Take 500-1,000 mg by mouth every 6 (six) hours as needed for mild pain.    [provider]  apixaban  (ELIQUIS ) 5 MG TABS tablet Take 1 tablet (5 mg total) by mouth 2 (two) times daily. 02/01/23   Ghimire, Estil Heman, MD  folic acid (FOLVITE) 1 MG tablet Take 1 mg by mouth every morning. 06/01/23   [provider]  hydrALAZINE  (APRESOLINE ) 100 MG tablet Take 150 mg by mouth 2 (two) times daily. 1 1/2 tablet 06/29/23   [provider]  metFORMIN  (GLUCOPHAGE -XR) 500 MG 24 hr tablet Take 1 tablet (500 mg total) by mouth 2 (two) times daily with a meal. Patient taking differently: Take 500 mg by mouth daily with breakfast. 03/12/23   Arellano Zameza, Priscila, MD  methimazole  (TAPAZOLE ) 10 MG tablet Take 1 tablet (10 mg total) by mouth daily with breakfast. 09/14/23   Nida, Jaynee Meyer, MD  metoprolol  tartrate (LOPRESSOR ) 100 MG tablet Take 100 mg by mouth 2 (two) times daily. 06/21/23   [provider]  spironolactone  (ALDACTONE ) 25 MG tablet Take 0.5 tablets (12.5 mg total) by mouth daily. 07/06/23 10/04/23   Debria Fang, PA-C  valsartan  (DIOVAN ) 160 MG tablet TAKE 1 TABLET BY MOUTH EVERY DAY 10/04/23   Debria Fang, PA-C      Allergies    Patient has no known allergies.    Review of Systems   Review of Systems  Constitutional:  Negative for diaphoresis and fever.  Respiratory:  Negative for cough and shortness of breath.   Cardiovascular:  Positive for chest pain. Negative for leg swelling.  Gastrointestinal:  Positive for nausea. Negative for abdominal pain and vomiting.    Physical Exam Updated Vital Signs BP (!) 158/77 (BP Location: Right Arm)   Pulse 83   Temp 98.6 F (37 C) (Oral)   Resp 18   Ht 5\' 1"  (1.549 m)   Wt 68.1 kg   SpO2 100%   BMI 28.37 kg/m  Physical Exam Vitals and nursing note reviewed.  Constitutional:      General: She is not in acute distress.    Appearance: She is well-developed. She is not ill-appearing or diaphoretic.  HENT:     Head: Normocephalic and atraumatic.  Cardiovascular:     Rate and Rhythm: Normal rate and regular rhythm.     Heart sounds: Normal heart sounds.  Pulmonary:     Effort: Pulmonary effort is normal.     Breath sounds: Normal breath sounds.  Chest:     Chest wall: Tenderness (right anterior chest) present.  Abdominal:     Palpations: Abdomen is soft.     Tenderness: There is no abdominal tenderness.  Musculoskeletal:     Right lower leg: No edema.     Left lower leg: No edema.  Skin:    General: Skin is warm and dry.  Neurological:     Mental Status: She is alert.     ED Results / Procedures / Treatments   Labs (all labs ordered are listed, but only abnormal results are displayed) Labs Reviewed  COMPREHENSIVE METABOLIC PANEL WITH GFR - Abnormal; Notable for the following components:      Result Value   Glucose, Bld 143 (*)    Creatinine, Ser 1.05 (*)    GFR, Estimated 55 (*)    All other components within normal limits  CBC WITH DIFFERENTIAL/PLATELET - Abnormal; Notable for the following  components:   Hemoglobin 11.3 (*)    MCH 24.0 (*)    MCHC 29.6 (*)    All other components within normal limits  D-DIMER, QUANTITATIVE  TROPONIN I (HIGH SENSITIVITY)  TROPONIN I (HIGH SENSITIVITY)    EKG EKG Interpretation Date/Time:  Friday Nov 09 2023 12:43:49 EDT Ventricular Rate:  81 PR Interval:  136 QRS Duration:  90 QT Interval:  369 QTC Calculation: 429 R Axis:   56  Text Interpretation: Sinus rhythm no acute ST/T changes no signifcant change since Feb 2025 Confirmed by Jerilynn Montenegro [phone]) on 11/09/2023 12:50:39 PM  Radiology No results found.  Procedures Procedures    Medications Ordered in ED Medications  acetaminophen  (TYLENOL ) tablet 650 mg (650 mg Oral Given 11/09/23 1320)    ED Course/ Medical Decision Making/ A&P                                 Medical Decision Making Amount and/or Complexity of Data Reviewed Labs: ordered.    Details: Normal troponin.  Normal WBC. Radiology: ordered. ECG/medicine tests: ordered and independent interpretation performed.  Risk OTC drugs.   Patient presents with right-sided chest pain.  It is reproducible and probably chest wall in etiology.  D-dimer is negative.  Initial troponin is negative as well.  No obvious pneumothorax on my read of the chest x-ray, which is pending radiology interpretation.  She was given some Tylenol .  She declines anything else for pain.  Low suspicion this is ACS, PE, dissection.  No radiation of the pain to the back.  No exertional component.  Care transferred to Dr. Randal Bury.        Final Clinical Impression(s) / ED Diagnoses Final diagnoses:  Chest wall pain    Rx / DC Orders ED Discharge Orders     None         Jerilynn Montenegro, MD 11/09/23 1525

## 2023-11-09 NOTE — ED Provider Notes (Signed)
 Signout from Dr Aldean Amass.  78 year old female here with right-sided chest pain.  Troponins are flat.  She is pending a radiology reading of her chest x-ray.  If no acute findings can be discharged for outpatient follow-up with her treatment team Physical Exam  BP (!) 158/77 (BP Location: Right Arm)   Pulse 83   Temp 98.6 F (37 C) (Oral)   Resp 18   Ht 5\' 1"  (1.549 m)   Wt 68.1 kg   SpO2 100%   BMI 28.37 kg/m   Physical Exam  Procedures  Procedures  ED Course / MDM    Medical Decision Making Amount and/or Complexity of Data Reviewed Labs: ordered. Radiology: ordered.  Risk OTC drugs.   Reviewed results of workup with patient and she is comfortable plan for discharge.  Recommended close follow-up with PCP.  Return instructions discussed       Tonya Fredrickson, MD 11/10/23 [phone]

## 2023-11-19 NOTE — Progress Notes (Unsigned)
 Cardiology Office Note   Date:  11/19/2023  ID:  Dorea, Duff April 28, 1946, MRN [id number] PCP: Maryellen Snare, NP  [facility] Providers Cardiologist:  Janelle Mediate, MD Cardiology APP:  Lasalle Pointer, NP    History of Present Illness Samantha [name] is a 78 y.o. female with a past medical history of palpitations/SVT, paroxysmal atrial fibrillation, HTN, HLD, type 2 DM, CKD stage IIIb, hyperthyroidism s/p iodine ablation in 11/2022, chronic back pain. Patient is followed by Dr. Nishan and presents today for a 3 month follow up appointment.    Patient previously had a nuclear stress test in 05/2020 that was a low risk study without evidence of ischemia or infarction. 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 She was referred to endocrinology and underwent neuroactive iodine thyroid  ablation in 11/2022. She was later admitted from 8/13--02/01/23 after she presented for evaluation of altered mental status and weakness. Upon arrival to the ED, she was tachycardic and BP was intermittently soft. EKG showed sinus tachycardia. She was admitted for acute metabolic encephalopathy due to AKI and hypernatremia. She was treated with IV fluids. While admitted, patient developed atrial fibrillation with RVR. She was started on IV diltiazem  drip and was given IV lopressor . Cardiology was consulted. Found to have TSH low at 0.028 and free T4 elevated to 17.5. Echocardiogram on 01/24/23 showed EF >75%, no regional wall motion abnormalities, mild LVH, grade I DD, normal RV function. There was elevated LVOT peak velocity of 4 m/s, likely due to hyperdynamic LV function and chordal SAM. Patient converted to NSR and was discharged on eliquis  5 mg BID, metoprolol  tartrate 50 mg BID.    Patient was last seen by Dr. Stann Earnest on 04/23/23. At that time, her BP was well controlled. She remained in NSR on lopressor  and eliquis . Of note, she was not completed compliant with  methimazole  or beta blocker. Instructed to be compliant with her medications and follow up with endocrinology. I saw her in clinic 07/06/23. At that time, her BP remained elevated. Meds were adjusted. When seen on 08/14/23, her BP had improved but was still slightly elevated. Valsartan  increased   She was seen in the ED on 5/30 with right sided chest pain. Worse with inspiration, sneezing ,or movement. Chest wall was tender to palpation. D-dimer negative. hsTn negative x2. Diagnosed with musculoskeltal chest pain, discharged from the ED   HTN  -  - Continue hydralazine  150 mg BID, valsartan  160 mg daily, spironolactone  12.5 mg daily, metoprolol  tartrate 100 mg BID  - K 3.7, creatinine 1.05 on 5/30    PAF  - Patient first diagnosed with PAF in 01/2023, had recurrence in 02/2023. Note, episodes occurred in the setting of uncontrolled hyperthyroidism  - Patient denies palpitations, fluttering/racing heart.  - EKG from 5/30 showed NSR  - Continue eliquis  5 mg BID. Denies bleeding on eliquis   - continue metoprolol  tartrate 100 mg BID    Hyperthyroidism  - Followed by endocrinology. Previously had neuroactive iodine thyroid  ablation in 11/2022. Considering repeat RAI treatment  - TSH 0.15, free T4 1.57, free T3 3.7 on 4/1  - On methimazole     CKD stage IIIb - Creatinine 1.05 on 5/30 ***    Type 2 DM  - A1c 7.1 in 02/2023  - Followed by PCP   ROS: ***  Studies Reviewed      *** Risk Assessment/Calculations {Does this patient have ATRIAL FIBRILLATION?:[phone]} No BP recorded.  {Refresh Note OR Click here  to enter BP  :1}***       Physical Exam VS:  There were no vitals taken for this visit.   Wt Readings from Last 3 Encounters:  11/09/23 150 lb 2.1 oz (68.1 kg)  10/18/23 150 lb 2.1 oz (68.1 kg)  09/14/23 150 lb 3.2 oz (68.1 kg)    GEN: Well nourished, well developed in no acute distress NECK: No JVD; No carotid bruits CARDIAC: ***RRR, no murmurs, rubs, gallops RESPIRATORY:  Clear  to auscultation without rales, wheezing or rhonchi  ABDOMEN: Soft, non-tender, non-distended EXTREMITIES:  No edema; No deformity   ASSESSMENT AND PLAN ***    {Are you ordering a CV Procedure (e.g. stress test, cath, DCCV, TEE, etc)?   Press F2        :[id number]}  Dispo: ***  Signed, Debria Fang, PA-C

## 2023-11-22 ENCOUNTER — Ambulatory Visit: Attending: Cardiology | Admitting: Cardiology

## 2023-11-22 ENCOUNTER — Encounter: Payer: Self-pay | Admitting: Cardiology

## 2023-11-22 VITALS — BP 132/78 | HR 71 | Ht 61.0 in | Wt 154.2 lb

## 2023-11-22 DIAGNOSIS — Z794 Long term (current) use of insulin: Secondary | ICD-10-CM

## 2023-11-22 DIAGNOSIS — E0590 Thyrotoxicosis, unspecified without thyrotoxic crisis or storm: Secondary | ICD-10-CM | POA: Diagnosis not present

## 2023-11-22 DIAGNOSIS — I480 Paroxysmal atrial fibrillation: Secondary | ICD-10-CM

## 2023-11-22 DIAGNOSIS — I10 Essential (primary) hypertension: Secondary | ICD-10-CM

## 2023-11-22 DIAGNOSIS — N1832 Chronic kidney disease, stage 3b: Secondary | ICD-10-CM

## 2023-11-22 DIAGNOSIS — R0789 Other chest pain: Secondary | ICD-10-CM | POA: Diagnosis not present

## 2023-11-22 DIAGNOSIS — E119 Type 2 diabetes mellitus without complications: Secondary | ICD-10-CM

## 2023-11-22 NOTE — Patient Instructions (Signed)
 Medication Instructions:  No changes *If you need a refill on your cardiac medications before your next appointment, please call your pharmacy*  Lab Work: No labs  Testing/Procedures: No testing  Follow-Up: At [facility], you and your health needs are our priority.  As part of our continuing mission to provide you with exceptional heart care, our providers are all part of one team.  This team includes your primary Cardiologist (physician) and Advanced Practice Providers or APPs (Physician Assistants and Nurse Practitioners) who all work together to provide you with the care you need, when you need it.  Your next appointment:   6 month(s)  Provider:   Janelle Mediate, MD In [place], [place]  We recommend signing up for the patient portal called MyChart.  Sign up information is provided on this After Visit Summary.  MyChart is used to connect with patients for Virtual Visits (Telemedicine).  Patients are able to view lab/test results, encounter notes, upcoming appointments, etc.  Non-urgent messages can be sent to your provider as well.   To learn more about what you can do with MyChart, go to [email or web address].

## 2023-12-07 ENCOUNTER — Emergency Department (HOSPITAL_COMMUNITY)

## 2023-12-07 ENCOUNTER — Other Ambulatory Visit: Payer: Self-pay

## 2023-12-07 ENCOUNTER — Inpatient Hospital Stay (HOSPITAL_COMMUNITY)
Admission: EM | Admit: 2023-12-07 | Discharge: 2023-12-10 | DRG: 194 | Disposition: A | Discharge status: Home / Self Care | Attending: Internal Medicine | Admitting: Internal Medicine

## 2023-12-07 ENCOUNTER — Encounter (HOSPITAL_COMMUNITY): Payer: Self-pay

## 2023-12-07 DIAGNOSIS — Z1152 Encounter for screening for COVID-19: Secondary | ICD-10-CM

## 2023-12-07 DIAGNOSIS — Z79899 Other long term (current) drug therapy: Secondary | ICD-10-CM | POA: Diagnosis not present

## 2023-12-07 DIAGNOSIS — I129 Hypertensive chronic kidney disease with stage 1 through stage 4 chronic kidney disease, or unspecified chronic kidney disease: Secondary | ICD-10-CM | POA: Diagnosis present

## 2023-12-07 DIAGNOSIS — I309 Acute pericarditis, unspecified: Secondary | ICD-10-CM | POA: Diagnosis not present

## 2023-12-07 DIAGNOSIS — I300 Acute nonspecific idiopathic pericarditis: Secondary | ICD-10-CM | POA: Diagnosis present

## 2023-12-07 DIAGNOSIS — Z7901 Long term (current) use of anticoagulants: Secondary | ICD-10-CM | POA: Diagnosis not present

## 2023-12-07 DIAGNOSIS — Z6827 Body mass index (BMI) 27.0-27.9, adult: Secondary | ICD-10-CM

## 2023-12-07 DIAGNOSIS — Z7984 Long term (current) use of oral hypoglycemic drugs: Secondary | ICD-10-CM

## 2023-12-07 DIAGNOSIS — E0590 Thyrotoxicosis, unspecified without thyrotoxic crisis or storm: Secondary | ICD-10-CM | POA: Diagnosis not present

## 2023-12-07 DIAGNOSIS — R9389 Abnormal findings on diagnostic imaging of other specified body structures: Secondary | ICD-10-CM | POA: Diagnosis present

## 2023-12-07 DIAGNOSIS — M19012 Primary osteoarthritis, left shoulder: Secondary | ICD-10-CM | POA: Diagnosis present

## 2023-12-07 DIAGNOSIS — I10 Essential (primary) hypertension: Secondary | ICD-10-CM | POA: Diagnosis present

## 2023-12-07 DIAGNOSIS — M170 Bilateral primary osteoarthritis of knee: Secondary | ICD-10-CM | POA: Diagnosis present

## 2023-12-07 DIAGNOSIS — R509 Fever, unspecified: Secondary | ICD-10-CM

## 2023-12-07 DIAGNOSIS — J90 Pleural effusion, not elsewhere classified: Secondary | ICD-10-CM | POA: Diagnosis present

## 2023-12-07 DIAGNOSIS — I480 Paroxysmal atrial fibrillation: Secondary | ICD-10-CM | POA: Diagnosis present

## 2023-12-07 DIAGNOSIS — I493 Ventricular premature depolarization: Secondary | ICD-10-CM | POA: Diagnosis present

## 2023-12-07 DIAGNOSIS — E039 Hypothyroidism, unspecified: Secondary | ICD-10-CM | POA: Diagnosis present

## 2023-12-07 DIAGNOSIS — N1832 Chronic kidney disease, stage 3b: Secondary | ICD-10-CM | POA: Diagnosis present

## 2023-12-07 DIAGNOSIS — R079 Chest pain, unspecified: Secondary | ICD-10-CM | POA: Diagnosis not present

## 2023-12-07 DIAGNOSIS — J9811 Atelectasis: Secondary | ICD-10-CM | POA: Diagnosis present

## 2023-12-07 DIAGNOSIS — E119 Type 2 diabetes mellitus without complications: Secondary | ICD-10-CM

## 2023-12-07 DIAGNOSIS — J189 Pneumonia, unspecified organism: Principal | ICD-10-CM | POA: Diagnosis present

## 2023-12-07 DIAGNOSIS — D631 Anemia in chronic kidney disease: Secondary | ICD-10-CM | POA: Diagnosis present

## 2023-12-07 DIAGNOSIS — Z8249 Family history of ischemic heart disease and other diseases of the circulatory system: Secondary | ICD-10-CM

## 2023-12-07 DIAGNOSIS — M19011 Primary osteoarthritis, right shoulder: Secondary | ICD-10-CM | POA: Diagnosis present

## 2023-12-07 DIAGNOSIS — R000 Tachycardia, unspecified: Secondary | ICD-10-CM | POA: Diagnosis not present

## 2023-12-07 DIAGNOSIS — I4719 Other supraventricular tachycardia: Secondary | ICD-10-CM | POA: Diagnosis present

## 2023-12-07 DIAGNOSIS — E1122 Type 2 diabetes mellitus with diabetic chronic kidney disease: Secondary | ICD-10-CM | POA: Diagnosis present

## 2023-12-07 DIAGNOSIS — E785 Hyperlipidemia, unspecified: Secondary | ICD-10-CM | POA: Diagnosis present

## 2023-12-07 DIAGNOSIS — I3139 Other pericardial effusion (noninflammatory): Secondary | ICD-10-CM | POA: Diagnosis not present

## 2023-12-07 DIAGNOSIS — I700 Atherosclerosis of aorta: Secondary | ICD-10-CM | POA: Diagnosis present

## 2023-12-07 DIAGNOSIS — Z833 Family history of diabetes mellitus: Secondary | ICD-10-CM | POA: Diagnosis not present

## 2023-12-07 DIAGNOSIS — E782 Mixed hyperlipidemia: Secondary | ICD-10-CM | POA: Diagnosis not present

## 2023-12-07 DIAGNOSIS — N183 Chronic kidney disease, stage 3 unspecified: Secondary | ICD-10-CM | POA: Diagnosis present

## 2023-12-07 DIAGNOSIS — N1831 Chronic kidney disease, stage 3a: Secondary | ICD-10-CM | POA: Diagnosis not present

## 2023-12-07 LAB — URINALYSIS, ROUTINE W REFLEX MICROSCOPIC
Bacteria, UA: NONE SEEN
Bilirubin Urine: NEGATIVE
Glucose, UA: NEGATIVE mg/dL
Ketones, ur: 20 mg/dL — AB
Leukocytes,Ua: NEGATIVE
Nitrite: NEGATIVE
Protein, ur: >=300 mg/dL — AB
Specific Gravity, Urine: 1.017 (ref 1.005–1.030)
pH: 5 (ref 5.0–8.0)

## 2023-12-07 LAB — COMPREHENSIVE METABOLIC PANEL WITH GFR
ALT: 9 U/L (ref 0–44)
AST: 12 U/L — ABNORMAL LOW (ref 15–41)
Albumin: 3 g/dL — ABNORMAL LOW (ref 3.5–5.0)
Alkaline Phosphatase: 89 U/L (ref 38–126)
Anion gap: 14 (ref 5–15)
BUN: 11 mg/dL (ref 8–23)
CO2: 17 mmol/L — ABNORMAL LOW (ref 22–32)
Calcium: 9.3 mg/dL (ref 8.9–10.3)
Chloride: 108 mmol/L (ref 98–111)
Creatinine, Ser: 1.19 mg/dL — ABNORMAL HIGH (ref 0.44–1.00)
GFR, Estimated: 47 mL/min — ABNORMAL LOW (ref >60)
Glucose, Bld: 165 mg/dL — ABNORMAL HIGH (ref 70–99)
Potassium: 3.9 mmol/L (ref 3.5–5.1)
Sodium: 139 mmol/L (ref 135–145)
Total Bilirubin: 1.2 mg/dL (ref 0.0–1.2)
Total Protein: 7.4 g/dL (ref 6.5–8.1)

## 2023-12-07 LAB — CBC WITH DIFFERENTIAL/PLATELET
Abs Immature Granulocytes: 0.05 10*3/uL (ref 0.00–0.07)
Basophils Absolute: 0 10*3/uL (ref 0.0–0.1)
Basophils Relative: 0 %
Eosinophils Absolute: 0 10*3/uL (ref 0.0–0.5)
Eosinophils Relative: 0 %
HCT: 35.6 % — ABNORMAL LOW (ref 36.0–46.0)
Hemoglobin: 10.5 g/dL — ABNORMAL LOW (ref 12.0–15.0)
Immature Granulocytes: 1 %
Lymphocytes Relative: 6 %
Lymphs Abs: 0.6 10*3/uL — ABNORMAL LOW (ref 0.7–4.0)
MCH: 23.5 pg — ABNORMAL LOW (ref 26.0–34.0)
MCHC: 29.5 g/dL — ABNORMAL LOW (ref 30.0–36.0)
MCV: 79.8 fL — ABNORMAL LOW (ref 80.0–100.0)
Monocytes Absolute: 0.8 10*3/uL (ref 0.1–1.0)
Monocytes Relative: 8 %
Neutro Abs: 7.9 10*3/uL — ABNORMAL HIGH (ref 1.7–7.7)
Neutrophils Relative %: 85 %
Platelets: 388 10*3/uL (ref 150–400)
RBC: 4.46 MIL/uL (ref 3.87–5.11)
RDW: 15.7 % — ABNORMAL HIGH (ref 11.5–15.5)
WBC: 9.3 10*3/uL (ref 4.0–10.5)
nRBC: 0 % (ref 0.0–0.2)

## 2023-12-07 LAB — TSH: TSH: 0.017 u[IU]/mL — ABNORMAL LOW (ref 0.350–4.500)

## 2023-12-07 LAB — RESP PANEL BY RT-PCR (RSV, FLU A&B, COVID)  RVPGX2
Influenza A by PCR: NEGATIVE
Influenza B by PCR: NEGATIVE
Resp Syncytial Virus by PCR: NEGATIVE
SARS Coronavirus 2 by RT PCR: NEGATIVE

## 2023-12-07 LAB — T4, FREE: Free T4: 2.72 ng/dL — ABNORMAL HIGH (ref 0.61–1.12)

## 2023-12-07 LAB — I-STAT CG4 LACTIC ACID, ED: Lactic Acid, Venous: 1.2 mmol/L (ref 0.5–1.9)

## 2023-12-07 LAB — TROPONIN I (HIGH SENSITIVITY)
Troponin I (High Sensitivity): 7 ng/L (ref <18)
Troponin I (High Sensitivity): 12 ng/L (ref <18)

## 2023-12-07 MED ORDER — IOHEXOL 350 MG/ML SOLN
75.0000 mL | Freq: Once | INTRAVENOUS | Status: AC | PRN
  Administered 2023-12-07: 75 mL via INTRAVENOUS

## 2023-12-07 MED ORDER — ACETAMINOPHEN 325 MG PO TABS
975.0000 mg | ORAL_TABLET | Freq: Once | ORAL | Status: AC
  Administered 2023-12-07: 975 mg via ORAL
  Filled 2023-12-07: qty 3

## 2023-12-07 MED ORDER — HYDRALAZINE HCL 25 MG PO TABS
100.0000 mg | ORAL_TABLET | Freq: Once | ORAL | Status: AC
  Administered 2023-12-07: 100 mg via ORAL
  Filled 2023-12-07: qty 4

## 2023-12-07 MED ORDER — SODIUM CHLORIDE 0.9 % IV BOLUS
1000.0000 mL | Freq: Once | INTRAVENOUS | Status: AC
Start: 2023-12-07 — End: 2023-12-07
  Administered 2023-12-07: 1000 mL via INTRAVENOUS

## 2023-12-07 MED ORDER — SODIUM CHLORIDE 0.9 % IV SOLN
2.0000 g | Freq: Once | INTRAVENOUS | Status: AC
  Administered 2023-12-07: 2 g via INTRAVENOUS
  Filled 2023-12-07: qty 20

## 2023-12-07 MED ORDER — SODIUM CHLORIDE 0.9 % IV SOLN
500.0000 mg | Freq: Once | INTRAVENOUS | Status: AC
  Administered 2023-12-07: 500 mg via INTRAVENOUS
  Filled 2023-12-07: qty 5

## 2023-12-07 MED ORDER — IOHEXOL 350 MG/ML SOLN
75.0000 mL | Freq: Once | INTRAVENOUS | Status: AC | PRN
Start: 2023-12-07 — End: 2023-12-07
  Administered 2023-12-07: 75 mL via INTRAVENOUS

## 2023-12-07 NOTE — Consult Note (Signed)
 Cardiology Consultation   Patient ID: Samantha [name] MRN: [id number]; DOB: [birth year]  Admit date: 12/07/2023 Date of Consult: 12/08/2023  PCP:  Campbell Reynolds, NP   [facility] Providers Cardiologist:  Maude Emmer, MD  Cardiology APP:  Miriam Norris, NP       Patient Profile: Samantha [name] is a 78 y.o. female with a hx of palpitations/SVT, paroxysmal atrial fibrillation, HTN, HLD, type 2 DM, CKD stage IIIb, hyperthyroidism s/p iodine ablation in 11/2022, chronic back pain  who is being seen 12/08/2023 for the evaluation of tachycardia, possible pericarditis/pericardial effusion at the request of Dr. Patsey.  History of Present Illness: Samantha [name] is a 77 y.o. female with a hx of palpitations/SVT, paroxysmal atrial fibrillation, HTN, HLD, type 2 DM, CKD stage IIIb, hyperthyroidism s/p iodine ablation in 11/2022, chronic back pain  who is being seen 12/08/2023 for the evaluation of tachycardia, possible pericarditis/pericardial effusion  Seen today with complaints of loss of appetite, fatigue, chest pain, chest soreness, mild cough.  She was seen in cardiology clinic on 11/22/2023 with right-sided chest pain which was thought to be noncardiac and Tylenol  was recommended. Today in the ER CT abdomen/pelvis was done which showed small pericardial effusion with small bowel thickening of pericardium, bilateral pleural effusion associated with atelectasis, endometrial thickness 7 mm. CT lung shows small left greater than right pleural effusion, airspace disease, aortic atherosclerosis, small pericardial effusion with thickening and enhancement. Troponin x 2 negative. EKG obtainedShows sinus tachycardia heart rate 105 bpm, nonspecific ST-T wave changes. EKG repeat shows possible atrial tachycardia, since PVC, baseline artifact.  Echocardiogram on 01/24/23 showed EF >75%, no regional wall motion abnormalities, mild LVH, grade I DD, normal RV function. There was elevated LVOT peak velocity of  4 m/s, likely due to hyperdynamic LV function and chordal SAM  2021 stress test, LVEF 74%, low risk study at low exercise capacity walk only for 4 minutes    Past Medical History:  Diagnosis Date   Allergic rhinitis    Arthritis of both knees    Arthritis of both shoulder regions    Bilateral carpal tunnel syndrome    Cataract    Chronic low back pain    CKD (chronic kidney disease), stage III (HCC)    Decreased vision    Diabetes mellitus    HTN (hypertension)    Hyperlipidemia    Morbid obesity (HCC)    Thyroid  disease     Past Surgical History:  Procedure Laterality Date   CATARACT EXTRACTION     COLONOSCOPY     COLONOSCOPY N/A 09/15/2020   Procedure: COLONOSCOPY;  Surgeon: Shaaron Lamar HERO, MD;  Location: [facility];  Service: Endoscopy;  Laterality: N/A;  ASA II / PM procedure   POLYPECTOMY  09/15/2020   Procedure: POLYPECTOMY;  Surgeon: Shaaron Lamar HERO, MD;  Location: [facility];  Service: Endoscopy;;   TUBAL LIGATION       Home Medications:  Prior to Admission medications   Medication Sig Start Date End Date Taking? Authorizing Provider  acetaminophen  (TYLENOL ) 500 MG tablet Take 500-1,000 mg by mouth every 6 (six) hours as needed for mild pain.    [provider]  apixaban  (ELIQUIS ) 5 MG TABS tablet Take 1 tablet (5 mg total) by mouth 2 (two) times daily. 02/01/23   Ghimire, Donalda HERO, MD  ferrous sulfate  325 (65 FE) MG tablet Take 325 mg by mouth daily. 09/04/23   [provider]  folic acid (FOLVITE) 1 MG tablet  Take 1 mg by mouth every morning. 06/01/23   [provider]  hydrALAZINE  (APRESOLINE ) 100 MG tablet Take 100 mg by mouth 2 (two) times daily. 06/29/23   [provider]  metFORMIN  (GLUCOPHAGE -XR) 500 MG 24 hr tablet Take 1 tablet (500 mg total) by mouth 2 (two) times daily with a meal. Patient taking differently: Take 500 mg by mouth daily with breakfast. 03/12/23   Arellano Zameza, Priscila, MD  methimazole  (TAPAZOLE ) 10  MG tablet Take 1 tablet (10 mg total) by mouth daily with breakfast. 09/14/23   Lenis Ethelle ORN, MD  metoprolol  tartrate (LOPRESSOR ) 100 MG tablet Take 100 mg by mouth 2 (two) times daily. 06/21/23   [provider]  spironolactone  (ALDACTONE ) 25 MG tablet Take 0.5 tablets (12.5 mg total) by mouth daily. Patient taking differently: Take 25 mg by mouth daily. 07/06/23 11/22/23  Vicci Rollo SAUNDERS, PA-C  valsartan  (DIOVAN ) 160 MG tablet TAKE 1 TABLET BY MOUTH EVERY DAY 10/04/23   Johnson, Kathleen R, PA-C    Scheduled Meds:  Continuous Infusions:  azithromycin 500 mg (12/07/23 2354)   PRN Meds:   Allergies:   No Known Allergies  Social History:   Social History   Socioeconomic History   Marital status: Widowed    Spouse name: Not on file   Number of children: Not on file   Years of education: Not on file   Highest education level: Not on file  Occupational History   Not on file  Tobacco Use   Smoking status: Never   Smokeless tobacco: Never  Vaping Use   Vaping status: Never Used  Substance and Sexual Activity   Alcohol use: Yes    Comment: occasionally   Drug use: No   Sexual activity: Yes    Birth control/protection: Surgical    Comment: tubal  Other Topics Concern   Not on file  Social History Narrative   Not on file   Social Drivers of Health   Financial Resource Strain: Low Risk  (11/16/2022)   Overall Financial Resource Strain (CARDIA)    Difficulty of Paying Living Expenses: Not very hard  Food Insecurity: No Food Insecurity (03/11/2023)   Hunger Vital Sign    Worried About Running Out of Food in the Last Year: Never true    Ran Out of Food in the Last Year: Never true  Transportation Needs: No Transportation Needs (03/11/2023)   PRAPARE - Administrator, Civil Service (Medical): No    Lack of Transportation (Non-Medical): No  Physical Activity: Inactive (11/16/2022)   Exercise Vital Sign    Days of Exercise per Week: 0 days    Minutes  of Exercise per Session: 0 min  Stress: No Stress Concern Present (11/16/2022)   [employer] of Occupational Health - Occupational Stress Questionnaire    Feeling of Stress : Only a little  Social Connections: Moderately Integrated (11/16/2022)   Social Connection and Isolation Panel    Frequency of Communication with Friends and Family: Three times a week    Frequency of Social Gatherings with Friends and Family: Once a week    Attends Religious Services: More than 4 times per year    Active Member of [employer] or Organizations: Yes    Attends Banker Meetings: 1 to 4 times per year    Marital Status: Widowed  Intimate Partner Violence: Not At Risk (03/11/2023)   Humiliation, Afraid, Rape, and Kick questionnaire    Fear of Current or Ex-Partner: No  Emotionally Abused: No    Physically Abused: No    Sexually Abused: No    Family History:    Family History  Problem Relation Age of Onset   Hypertension Father    Hypertension Mother    Diabetes Mother    Breast cancer Mother    Hypertension Sister    Cancer Sister        pancreatic   Cancer Sister        pancreatic   Liver disease Sister      ROS:  Please see the history of present illness.   All other ROS reviewed and negative.     Physical Exam/Data: Vitals:   12/07/23 1825 12/07/23 1932 12/07/23 2230 12/07/23 2233  BP:  (!) 153/89 (!) 173/92   Pulse:  (!) 103 98   Resp:  18 12   Temp: 98.6 F (37 C)   98.5 F (36.9 C)  TempSrc: Oral   Oral  SpO2:  100% 99%   Weight:      Height:        Intake/Output Summary (Last 24 hours) at 12/08/2023 0000 Last data filed at 12/07/2023 2345 Gross per 24 hour  Intake 100 ml  Output --  Net 100 ml      12/07/2023    2:26 PM 11/22/2023    3:42 PM 11/09/2023   12:35 PM  Last 3 Weights  Weight (lbs) 145 lb 154 lb 3.2 oz 150 lb 2.1 oz  Weight (kg) 65.772 kg 69.945 kg 68.1 kg     Body mass index is 27.4 kg/m.  General:  Well nourished, well developed, in no  acute distress HEENT: normal Neck: no JVD Vascular: No carotid bruits; Distal pulses 2+ bilaterally Cardiac:  normal S1, S2; RRR; no murmur  Lungs:  clear to auscultation bilaterally, no wheezing, rhonchi or rales  Abd: soft, nontender, no hepatomegaly  Ext: no edema Musculoskeletal:  No deformities, BUE and BLE strength normal and equal Skin: warm and dry  Neuro:  CNs 2-12 intact, no focal abnormalities noted Psych:  Normal affect     Relevant CV Studies: As noted above  Laboratory Data: High Sensitivity Troponin:   Recent Labs  Lab 11/09/23 1237 11/09/23 1439 12/07/23 1511 12/07/23 1749  TROPONINIHS 3 3 7 12      Chemistry Recent Labs  Lab 12/07/23 1511  NA 139  K 3.9  CL 108  CO2 17*  GLUCOSE 165*  BUN 11  CREATININE 1.19*  CALCIUM 9.3  GFRNONAA 47*  ANIONGAP 14    Recent Labs  Lab 12/07/23 1511  PROT 7.4  ALBUMIN 3.0*  AST 12*  ALT 9  ALKPHOS 89  BILITOT 1.2   Lipids No results for input(s): CHOL, TRIG, HDL, LABVLDL, LDLCALC, CHOLHDL in the last 168 hours.  Hematology Recent Labs  Lab 12/07/23 1511  WBC 9.3  RBC 4.46  HGB 10.5*  HCT 35.6*  MCV 79.8*  MCH 23.5*  MCHC 29.5*  RDW 15.7*  PLT 388   Thyroid   Recent Labs  Lab 12/07/23 1749  TSH 0.017*  FREET4 2.72*    BNPNo results for input(s): BNP, PROBNP in the last 168 hours.  DDimer No results for input(s): DDIMER in the last 168 hours.  Radiology/Studies:  CT Chest W Contrast Result Date: 12/07/2023 CLINICAL DATA:  Nonspecific chest pain. EXAM: CT CHEST WITH CONTRAST TECHNIQUE: Multidetector CT imaging of the chest was performed during intravenous contrast administration. RADIATION DOSE REDUCTION: This exam was performed according to the departmental  dose-optimization program which includes automated exposure control, adjustment of the mA and/or kV according to patient size and/or use of iterative reconstruction technique. CONTRAST:  75mL OMNIPAQUE  IOHEXOL  350 MG/ML  SOLN COMPARISON:  Same day CT abdomen pelvis and same day chest radiograph; CT chest 01/19/2023 FINDINGS: Cardiovascular: Small pericardial effusion with pericardial thickening and hyperenhancement compatible with pericarditis. Normal caliber thoracic aorta. No dissection. Mild aortic atherosclerotic calcification. Mediastinum/Nodes: Enlarged multinodular right thyroid  lobe. This was previously evaluated with ultrasound 10/20/2022. Trachea and esophagus are unremarkable. Trace stranding and free fluid in the anterior mediastinum. Lungs/Pleura: Small left greater than right pleural effusions. Airspace opacities in the lower lobes with central hypoattenuation suggesting pneumonia. No pneumothorax. Upper Abdomen: No acute abnormality. Musculoskeletal: No acute fracture. IMPRESSION: 1. Small pericardial effusion with pericardial thickening and hyperenhancement compatible with pericarditis. 2. Small left greater than right pleural effusions. Airspace opacities in the lower lobes with central hypoattenuation suggesting pneumonia. 3. Aortic Atherosclerosis (ICD10-I70.0). Electronically Signed   By: Norman Gatlin M.D.   On: 12/07/2023 22:41   CT ABDOMEN PELVIS W CONTRAST Result Date: 12/07/2023 CLINICAL DATA:  Chest pain, sepsis EXAM: CT ABDOMEN AND PELVIS WITH CONTRAST TECHNIQUE: Multidetector CT imaging of the abdomen and pelvis was performed using the standard protocol following bolus administration of intravenous contrast. RADIATION DOSE REDUCTION: This exam was performed according to the departmental dose-optimization program which includes automated exposure control, adjustment of the mA and/or kV according to patient size and/or use of iterative reconstruction technique. CONTRAST:  75mL OMNIPAQUE  IOHEXOL  350 MG/ML SOLN COMPARISON:  CT abdomen pelvis 01/19/2023 FINDINGS: Lower chest: Small bilateral pleural effusions. Associated atelectasis or pneumonia. Small pericardial effusion. Question pericardial thickening  and hyperenhancement. Hepatobiliary: No acute abnormality. Pancreas: Unremarkable. Spleen: Unremarkable. Adrenals/Urinary Tract: Unremarkable adrenal glands and kidneys. No urinary calculi or hydronephrosis. Unremarkable bladder. Stomach/Bowel: No bowel obstruction or bowel wall thickening. Colonic diverticulosis without diverticulitis. Stomach and appendix are within normal limits. Vascular/Lymphatic: Mild aortic atherosclerotic calcification. No lymphadenopathy. Reproductive: Endometrial thickening measuring up to 7 mm. Bilateral adnexa are unremarkable. Other: No free intraperitoneal fluid or air. Musculoskeletal: No acute fracture. IMPRESSION: 1. Small pericardial effusion with possible thickening and hyperenhancement of the pericardium. Recommend CT chest with IV contrast for further evaluation for pericarditis. 2. Small bilateral pleural effusions. Associated atelectasis or pneumonia. 3. Endometrial thickening measuring 7 mm. Recommend nonemergent pelvic ultrasound for further evaluation. 4. No acute abnormality in the abdomen or pelvis. 5. Aortic Atherosclerosis (ICD10-I70.0). Electronically Signed   By: Norman Gatlin M.D.   On: 12/07/2023 20:34   DG Chest Portable 1 View Result Date: 12/07/2023 CLINICAL DATA:  pna? EXAM: PORTABLE CHEST - 1 VIEW COMPARISON:  Nov 09, 2023 FINDINGS: No focal airspace consolidation, pleural effusion, or pneumothorax. Mild cardiomegaly. Aortic atherosclerosis. No acute fracture or destructive lesions. Multilevel thoracic osteophytosis. IMPRESSION: No acute cardiopulmonary abnormality. Electronically Signed   By: Rogelia Myers M.D.   On: 12/07/2023 15:42     Assessment and Plan: Possible pericarditis/pericardial effusion, chest pain atypical. Atrial tachycardia, heart rate 150 bpm History of paroxysmal A-fib/SVT Chest soreness could be secondary to above. Pneumonia Abnormal thyroid  labs, TSH too suppressed CKD stage IIIb and multiple other comorbidities   :  Plan: Patient admitted under hospitalist team, cardiology consulted. Check CRP, ESR, EKG does not look classic for pericarditis. CRP and ESR are elevated ->  start colchicine for treatment of pericarditis.  -> Holding off on ibuprofen  due to CKD. Obtain echocardiogram in a.m to evaluate pericardial effusion. If small consider adding aspirin   for acute pericarditis. Pericarditis with pericardial effusion unclear etiology. Continue to trend EKG and troponin. In regards to SVT--> continue home dose of metoprolol  100 mg twice daily, Eliquis  5 mg twice daily, antihypertensive medication. Continue pneumonia treatment per hospital team We will continue to follow     Risk Assessment/Risk Scores:         CHA2DS2-VASc Score = 5   This indicates a 7.2% annual risk of stroke. The patient's score is based upon: CHF History: 0 HTN History: 1 Diabetes History: 1 Stroke History: 0 Vascular Disease History: 0 Age Score: 2 Gender Score: 1        For questions or updates, please contact [facility] Please consult [email or web address] for contact info under    Signed, Grayce Bold, MD  12/08/2023 12:00 AM

## 2023-12-07 NOTE — ED Notes (Signed)
RN assisted pt to restroom. 

## 2023-12-07 NOTE — Consult Note (Incomplete)
 Cardiology Consultation   Patient ID: Samantha [name] MRN: [id number]; DOB: 1945/09/08  Admit date: 12/07/2023 Date of Consult: 12/08/2023  PCP:  Campbell Reynolds, NP   [facility] Providers Cardiologist:  Maude Emmer, MD  Cardiology APP:  Miriam Norris, NP  { Click here to update MD or APP on Care Team, Refresh:1}     Patient Profile: Samantha [name] is a 78 y.o. female with a hx of *** who is being seen 12/08/2023 for the evaluation of *** at the request of ***.  History of Present Illness: [name] ***   Past Medical History:  Diagnosis Date  . Allergic rhinitis   . Arthritis of both knees   . Arthritis of both shoulder regions   . Bilateral carpal tunnel syndrome   . Cataract   . Chronic low back pain   . CKD (chronic kidney disease), stage III (HCC)   . Decreased vision   . Diabetes mellitus   . HTN (hypertension)   . Hyperlipidemia   . Morbid obesity (HCC)   . Thyroid  disease     Past Surgical History:  Procedure Laterality Date  . CATARACT EXTRACTION    . COLONOSCOPY    . COLONOSCOPY N/A 09/15/2020   Procedure: COLONOSCOPY;  Surgeon: Shaaron Lamar HERO, MD;  Location: [facility];  Service: Endoscopy;  Laterality: N/A;  ASA II / PM procedure  . POLYPECTOMY  09/15/2020   Procedure: POLYPECTOMY;  Surgeon: Shaaron Lamar HERO, MD;  Location: [facility];  Service: Endoscopy;;  . TUBAL LIGATION       {Home Medications (Optional):21181}  Scheduled Meds:  Continuous Infusions: . azithromycin 500 mg (12/07/23 2354)   PRN Meds:   Allergies:   No Known Allergies  Social History:   Social History   Socioeconomic History  . Marital status: Widowed    Spouse name: Not on file  . Number of children: Not on file  . Years of education: Not on file  . Highest education level: Not on file  Occupational History  . Not on file  Tobacco Use  . Smoking status: Never  . Smokeless tobacco: Never  Vaping Use  . Vaping status: Never Used  Substance and Sexual  Activity  . Alcohol use: Yes    Comment: occasionally  . Drug use: No  . Sexual activity: Yes    Birth control/protection: Surgical    Comment: tubal  Other Topics Concern  . Not on file  Social History Narrative  . Not on file   Social Drivers of Health   Financial Resource Strain: Low Risk  (11/16/2022)   Overall Financial Resource Strain (CARDIA)   . Difficulty of Paying Living Expenses: Not very hard  Food Insecurity: No Food Insecurity (03/11/2023)   Hunger Vital Sign   . Worried About Programme researcher, broadcasting/film/video in the Last Year: Never true   . Ran Out of Food in the Last Year: Never true  Transportation Needs: No Transportation Needs (03/11/2023)   PRAPARE - Transportation   . Lack of Transportation (Medical): No   . Lack of Transportation (Non-Medical): No  Physical Activity: Inactive (11/16/2022)   Exercise Vital Sign   . Days of Exercise per Week: 0 days   . Minutes of Exercise per Session: 0 min  Stress: No Stress Concern Present (11/16/2022)   [employer] of Occupational Health - Occupational Stress Questionnaire   . Feeling of Stress : Only a little  Social Connections: Moderately Integrated (11/16/2022)   Social  Connection and Isolation Panel   . Frequency of Communication with Friends and Family: Three times a week   . Frequency of Social Gatherings with Friends and Family: Once a week   . Attends Religious Services: More than 4 times per year   . Active Member of Clubs or Organizations: Yes   . Attends Banker Meetings: 1 to 4 times per year   . Marital Status: Widowed  Intimate Partner Violence: Not At Risk (03/11/2023)   Humiliation, Afraid, Rape, and Kick questionnaire   . Fear of Current or Ex-Partner: No   . Emotionally Abused: No   . Physically Abused: No   . Sexually Abused: No    Family History:   *** Family History  Problem Relation Age of Onset  . Hypertension Father   . Hypertension Mother   . Diabetes Mother   . Breast cancer Mother    . Hypertension Sister   . Cancer Sister        pancreatic  . Cancer Sister        pancreatic  . Liver disease Sister      ROS:  Please see the history of present illness.  *** All other ROS reviewed and negative.     Physical Exam/Data: Vitals:   12/07/23 1825 12/07/23 1932 12/07/23 2230 12/07/23 2233  BP:  (!) 153/89 (!) 173/92   Pulse:  (!) 103 98   Resp:  18 12   Temp: 98.6 F (37 C)   98.5 F (36.9 C)  TempSrc: Oral   Oral  SpO2:  100% 99%   Weight:      Height:        Intake/Output Summary (Last 24 hours) at 12/08/2023 0000 Last data filed at 12/07/2023 2345 Gross per 24 hour  Intake 100 ml  Output --  Net 100 ml      12/07/2023    2:26 PM 11/22/2023    3:42 PM 11/09/2023   12:35 PM  Last 3 Weights  Weight (lbs) 145 lb 154 lb 3.2 oz 150 lb 2.1 oz  Weight (kg) 65.772 kg 69.945 kg 68.1 kg     Body mass index is 27.4 kg/m.  General:  Well nourished, well developed, in no acute distress*** HEENT: normal Neck: no JVD Vascular: No carotid bruits; Distal pulses 2+ bilaterally Cardiac:  normal S1, S2; RRR; no murmur *** Lungs:  clear to auscultation bilaterally, no wheezing, rhonchi or rales  Abd: soft, nontender, no hepatomegaly  Ext: no edema Musculoskeletal:  No deformities, BUE and BLE strength normal and equal Skin: warm and dry  Neuro:  CNs 2-12 intact, no focal abnormalities noted Psych:  Normal affect   EKG:  The EKG was personally reviewed and demonstrates:  *** Telemetry:  Telemetry was personally reviewed and demonstrates:  ***  Relevant CV Studies: ***  Laboratory Data: High Sensitivity Troponin:   Recent Labs  Lab 11/09/23 1237 11/09/23 1439 12/07/23 1511 12/07/23 1749  TROPONINIHS 3 3 7 12      Chemistry Recent Labs  Lab 12/07/23 1511  NA 139  K 3.9  CL 108  CO2 17*  GLUCOSE 165*  BUN 11  CREATININE 1.19*  CALCIUM 9.3  GFRNONAA 47*  ANIONGAP 14    Recent Labs  Lab 12/07/23 1511  PROT 7.4  ALBUMIN 3.0*  AST 12*  ALT  9  ALKPHOS 89  BILITOT 1.2   Lipids No results for input(s): CHOL, TRIG, HDL, LABVLDL, LDLCALC, CHOLHDL in the last 168 hours.  Hematology Recent Labs  Lab 12/07/23 1511  WBC 9.3  RBC 4.46  HGB 10.5*  HCT 35.6*  MCV 79.8*  MCH 23.5*  MCHC 29.5*  RDW 15.7*  PLT 388   Thyroid   Recent Labs  Lab 12/07/23 1749  TSH 0.017*  FREET4 2.72*    BNPNo results for input(s): BNP, PROBNP in the last 168 hours.  DDimer No results for input(s): DDIMER in the last 168 hours.  Radiology/Studies:  CT Chest W Contrast Result Date: 12/07/2023 CLINICAL DATA:  Nonspecific chest pain. EXAM: CT CHEST WITH CONTRAST TECHNIQUE: Multidetector CT imaging of the chest was performed during intravenous contrast administration. RADIATION DOSE REDUCTION: This exam was performed according to the departmental dose-optimization program which includes automated exposure control, adjustment of the mA and/or kV according to patient size and/or use of iterative reconstruction technique. CONTRAST:  75mL OMNIPAQUE  IOHEXOL  350 MG/ML SOLN COMPARISON:  Same day CT abdomen pelvis and same day chest radiograph; CT chest 01/19/2023 FINDINGS: Cardiovascular: Small pericardial effusion with pericardial thickening and hyperenhancement compatible with pericarditis. Normal caliber thoracic aorta. No dissection. Mild aortic atherosclerotic calcification. Mediastinum/Nodes: Enlarged multinodular right thyroid  lobe. This was previously evaluated with ultrasound 10/20/2022. Trachea and esophagus are unremarkable. Trace stranding and free fluid in the anterior mediastinum. Lungs/Pleura: Small left greater than right pleural effusions. Airspace opacities in the lower lobes with central hypoattenuation suggesting pneumonia. No pneumothorax. Upper Abdomen: No acute abnormality. Musculoskeletal: No acute fracture. IMPRESSION: 1. Small pericardial effusion with pericardial thickening and hyperenhancement compatible with  pericarditis. 2. Small left greater than right pleural effusions. Airspace opacities in the lower lobes with central hypoattenuation suggesting pneumonia. 3. Aortic Atherosclerosis (ICD10-I70.0). Electronically Signed   By: Norman Gatlin M.D.   On: 12/07/2023 22:41   CT ABDOMEN PELVIS W CONTRAST Result Date: 12/07/2023 CLINICAL DATA:  Chest pain, sepsis EXAM: CT ABDOMEN AND PELVIS WITH CONTRAST TECHNIQUE: Multidetector CT imaging of the abdomen and pelvis was performed using the standard protocol following bolus administration of intravenous contrast. RADIATION DOSE REDUCTION: This exam was performed according to the departmental dose-optimization program which includes automated exposure control, adjustment of the mA and/or kV according to patient size and/or use of iterative reconstruction technique. CONTRAST:  75mL OMNIPAQUE  IOHEXOL  350 MG/ML SOLN COMPARISON:  CT abdomen pelvis 01/19/2023 FINDINGS: Lower chest: Small bilateral pleural effusions. Associated atelectasis or pneumonia. Small pericardial effusion. Question pericardial thickening and hyperenhancement. Hepatobiliary: No acute abnormality. Pancreas: Unremarkable. Spleen: Unremarkable. Adrenals/Urinary Tract: Unremarkable adrenal glands and kidneys. No urinary calculi or hydronephrosis. Unremarkable bladder. Stomach/Bowel: No bowel obstruction or bowel wall thickening. Colonic diverticulosis without diverticulitis. Stomach and appendix are within normal limits. Vascular/Lymphatic: Mild aortic atherosclerotic calcification. No lymphadenopathy. Reproductive: Endometrial thickening measuring up to 7 mm. Bilateral adnexa are unremarkable. Other: No free intraperitoneal fluid or air. Musculoskeletal: No acute fracture. IMPRESSION: 1. Small pericardial effusion with possible thickening and hyperenhancement of the pericardium. Recommend CT chest with IV contrast for further evaluation for pericarditis. 2. Small bilateral pleural effusions. Associated  atelectasis or pneumonia. 3. Endometrial thickening measuring 7 mm. Recommend nonemergent pelvic ultrasound for further evaluation. 4. No acute abnormality in the abdomen or pelvis. 5. Aortic Atherosclerosis (ICD10-I70.0). Electronically Signed   By: Norman Gatlin M.D.   On: 12/07/2023 20:34   DG Chest Portable 1 View Result Date: 12/07/2023 CLINICAL DATA:  pna? EXAM: PORTABLE CHEST - 1 VIEW COMPARISON:  Nov 09, 2023 FINDINGS: No focal airspace consolidation, pleural effusion, or pneumothorax. Mild cardiomegaly. Aortic atherosclerosis. No acute fracture or destructive lesions. Multilevel thoracic  osteophytosis. IMPRESSION: No acute cardiopulmonary abnormality. Electronically Signed   By: Rogelia Myers M.D.   On: 12/07/2023 15:42     Assessment and Plan: Possible pericarditis/pericardial effusion, chest pain atypical.    Risk Assessment/Risk Scores: {Complete the following score calculators/questions to meet required metrics.  Press F2         :[id number]}   {Is the patient being seen for unstable angina, ACS, NSTEMI or STEMI?[phone]} {Does this patient have CHF or CHF symptoms?      :[id number]} {Does this patient have ATRIAL FIBRILLATION?:[id number]}  {Are we signing off today?:[id number]}  For questions or updates, please contact [facility] Please consult [email or web address] for contact info under    Signed, Grayce Bold, MD  12/08/2023 12:00 AM

## 2023-12-07 NOTE — ED Triage Notes (Signed)
 C/O chest soreness and loss of appetite for 3 days. Denies SHOB. C/O flu like sx. Denies n/v/d

## 2023-12-07 NOTE — ED Provider Notes (Signed)
 [place] EMERGENCY DEPARTMENT AT [facility] Provider Note   CSN: [id number] Arrival date & time: 12/07/23  1347     Patient presents with: Chest Pain   Samantha [name] is a 77 y.o. female.  With a history of hypertension hyperlipidemia type 2 diabetes and UTIs who presents to the ED for chest discomfort.  Patient endorses right-sided chest discomfort loss of appetite for the last 3 days.  She has had a mild active cough with white sputum.  She is febrile here.  An episode of diarrhea yesterday.  No nausea or vomiting denies urinary symptoms at this time but has a history of severe urosepsis    Chest Pain      Prior to Admission medications   Medication Sig Start Date End Date Taking? Authorizing Provider  acetaminophen  (TYLENOL ) 500 MG tablet Take 500-1,000 mg by mouth every 6 (six) hours as needed for mild pain.    [provider]  apixaban  (ELIQUIS ) 5 MG TABS tablet Take 1 tablet (5 mg total) by mouth 2 (two) times daily. 02/01/23   Ghimire, Donalda HERO, MD  ferrous sulfate  325 (65 FE) MG tablet Take 325 mg by mouth daily. 09/04/23   [provider]  folic acid (FOLVITE) 1 MG tablet Take 1 mg by mouth every morning. 06/01/23   [provider]  hydrALAZINE  (APRESOLINE ) 100 MG tablet Take 100 mg by mouth 2 (two) times daily. 06/29/23   [provider]  metFORMIN  (GLUCOPHAGE -XR) 500 MG 24 hr tablet Take 1 tablet (500 mg total) by mouth 2 (two) times daily with a meal. Patient taking differently: Take 500 mg by mouth daily with breakfast. 03/12/23   Arellano Zameza, Richrd, MD  methimazole  (TAPAZOLE ) 10 MG tablet Take 1 tablet (10 mg total) by mouth daily with breakfast. 09/14/23   Lenis Ethelle LELON, MD  metoprolol  tartrate (LOPRESSOR ) 100 MG tablet Take 100 mg by mouth 2 (two) times daily. 06/21/23   [provider]  spironolactone  (ALDACTONE ) 25 MG tablet Take 0.5 tablets (12.5 mg total) by mouth daily. Patient taking differently: Take  25 mg by mouth daily. 07/06/23 11/22/23  Vicci Rollo SAUNDERS, PA-C  valsartan  (DIOVAN ) 160 MG tablet TAKE 1 TABLET BY MOUTH EVERY DAY 10/04/23   Johnson, Kathleen R, PA-C    Allergies: Patient has no known allergies.    Review of Systems  Cardiovascular:  Positive for chest pain.    Updated Vital Signs BP (!) 196/98   Pulse (!) 105   Temp (!) 103 F (39.4 C)   Resp 20   Ht 5' 1 (1.549 m)   Wt 65.8 kg   SpO2 100%   BMI 27.40 kg/m   Physical Exam Vitals and nursing note reviewed.  HENT:     Head: Normocephalic and atraumatic.   Eyes:     Pupils: Pupils are equal, round, and reactive to light.    Cardiovascular:     Rate and Rhythm: Normal rate and regular rhythm.  Pulmonary:     Effort: Pulmonary effort is normal.     Breath sounds: Normal breath sounds.  Abdominal:     Palpations: Abdomen is soft.     Tenderness: There is no abdominal tenderness.   Skin:    General: Skin is warm and dry.   Neurological:     Mental Status: She is alert.   Psychiatric:        Mood and Affect: Mood normal.     (all labs ordered are listed, but  only abnormal results are displayed) Labs Reviewed  CBC WITH DIFFERENTIAL/PLATELET - Abnormal; Notable for the following components:      Result Value   Hemoglobin 10.5 (*)    HCT 35.6 (*)    MCV 79.8 (*)    MCH 23.5 (*)    MCHC 29.5 (*)    RDW 15.7 (*)    Neutro Abs 7.9 (*)    Lymphs Abs 0.6 (*)    All other components within normal limits  COMPREHENSIVE METABOLIC PANEL WITH GFR - Abnormal; Notable for the following components:   CO2 17 (*)    Glucose, Bld 165 (*)    Creatinine, Ser 1.19 (*)    Albumin 3.0 (*)    AST 12 (*)    GFR, Estimated 47 (*)    All other components within normal limits  URINALYSIS, ROUTINE W REFLEX MICROSCOPIC - Abnormal; Notable for the following components:   APPearance HAZY (*)    Hgb urine dipstick SMALL (*)    Ketones, ur 20 (*)    Protein, ur >=300 (*)    All other components within normal  limits  RESP PANEL BY RT-PCR (RSV, FLU A&B, COVID)  [id number]  I-STAT CG4 LACTIC ACID, ED  I-STAT CG4 LACTIC ACID, ED  TROPONIN I (HIGH SENSITIVITY)  TROPONIN I (HIGH SENSITIVITY)    EKG: EKG Interpretation Date/Time:  Friday December 07 2023 14:42:28 EDT Ventricular Rate:  105 PR Interval:  118 QRS Duration:  73 QT Interval:  356 QTC Calculation: 471 R Axis:   49  Text Interpretation: Sinus tachycardia Nonspecific T abnormalities, lateral leads Confirmed by Pamella Sharper [phone]) on 12/07/2023 4:38:37 PM  Radiology: [facility] Chest Portable 1 View Result Date: 12/07/2023 CLINICAL DATA:  pna? EXAM: PORTABLE CHEST - 1 VIEW COMPARISON:  Nov 09, 2023 FINDINGS: No focal airspace consolidation, pleural effusion, or pneumothorax. Mild cardiomegaly. Aortic atherosclerosis. No acute fracture or destructive lesions. Multilevel thoracic osteophytosis. IMPRESSION: No acute cardiopulmonary abnormality. Electronically Signed   By: Rogelia Myers M.D.   On: 12/07/2023 15:42     Procedures   Medications Ordered in the ED  acetaminophen  (TYLENOL ) tablet 975 mg (975 mg Oral Given 12/07/23 1509)  sodium chloride  0.9 % bolus 1,000 mL (1,000 mLs Intravenous New Bag/Given 12/07/23 1513)  hydrALAZINE  (APRESOLINE ) tablet 100 mg (100 mg Oral Given 12/07/23 1508)    Clinical Course as of 12/07/23 1640  Fri Dec 07, 2023  1540 I, Sharper Pamella DO, am transitioning care of this patient to the oncoming provider pending remainder of labs workup, reevaluation and disposition [MP]    Clinical Course User Index [MP] Pamella Sharper LABOR, DO                                 Medical Decision Making 78 year old female with history as above presenting to the ED given concern for chest discomfort, weakness and general malaise at home.  Initial vital signs notable for fever of 103 tachycardia and hypertension.  Patient mentions that she did not take her morning medications including antihypertensive regimen today.  No adventitious  lung sounds on my exam.  Differential diagnosis includes ACS, dysrhythmia pneumonia, UTI, viral respiratory illness.  Low suspicion for intra-abdominal infection given lack of abdominal tenderness on my exam.  Will obtain infectious workup including chest x-ray and urinalysis.  Will provide her with Tylenol  for the fever and consider antibiotics once labs are back  Amount and/or Complexity of Data  Reviewed Labs: ordered. Radiology: ordered.  Risk OTC drugs. Prescription drug management.        Final diagnoses:  Chest pain, unspecified type  Fever, unspecified fever cause    ED Discharge Orders     None          Pamella Ozell LABOR, DO 12/07/23 1640

## 2023-12-07 NOTE — ED Provider Notes (Signed)
  Physical Exam  BP (!) 173/92   Pulse 98   Temp 98.5 F (36.9 C) (Oral)   Resp 12   Ht 5' 1 (1.549 m)   Wt 65.8 kg   SpO2 99%   BMI 27.40 kg/m   Physical Exam  Procedures  Procedures  ED Course / MDM   Clinical Course as of 12/07/23 2306  Fri Dec 07, 2023  1540 I, Ozell Marine DO, am transitioning care of this patient to the oncoming provider pending remainder of labs workup, reevaluation and disposition [MP]    Clinical Course User Index [MP] Marine Ozell LABOR, DO   Medical Decision Making Amount and/or Complexity of Data Reviewed Labs: ordered. Radiology: ordered.  Risk OTC drugs. Prescription drug management.   Received in signout.  Some chest pain loss appetite.  Occasional cough.  Found to have fever upon arrival.  Differential diagnosis includes most likely pneumonia versus UTI.  X-ray did not show pneumonia.  CT scan done due to abdominal tenderness.  However CT scan of the abdomen did show potential pericarditis.  CT scan of the chest recommended.  CT scan of the chest done and showed pericarditis but also potential pneumonia.  Will treat with antibiotics.  History of hyperthyroidism.  Thyroid  functions are elevated.  History of atrial fibrillation and is having somewhat frequent runs of what appears to be A-fib or SVT.  Also does have ventricular beats.  Negative troponin.  CRITICAL CARE Performed by: [place] Total critical care time: 30 minutes Critical care time was exclusive of separately billable procedures and treating other patients. Critical care was necessary to treat or prevent imminent or life-threatening deterioration. Critical care was time spent personally by me on the following activities: development of treatment plan with patient and/or surrogate as well as nursing, discussions with consultants, evaluation of patient's response to treatment, examination of patient, obtaining history from patient or surrogate, ordering and performing  treatments and interventions, ordering and review of laboratory studies, ordering and review of radiographic studies, pulse oximetry and re-evaluation of patient's condition.        River Rankin, MD 12/07/23 2306

## 2023-12-08 DIAGNOSIS — I3139 Other pericardial effusion (noninflammatory): Secondary | ICD-10-CM

## 2023-12-08 DIAGNOSIS — J189 Pneumonia, unspecified organism: Secondary | ICD-10-CM | POA: Diagnosis not present

## 2023-12-08 DIAGNOSIS — E119 Type 2 diabetes mellitus without complications: Secondary | ICD-10-CM

## 2023-12-08 DIAGNOSIS — N1831 Chronic kidney disease, stage 3a: Secondary | ICD-10-CM

## 2023-12-08 DIAGNOSIS — N183 Chronic kidney disease, stage 3 unspecified: Secondary | ICD-10-CM | POA: Diagnosis present

## 2023-12-08 DIAGNOSIS — I300 Acute nonspecific idiopathic pericarditis: Secondary | ICD-10-CM | POA: Diagnosis not present

## 2023-12-08 DIAGNOSIS — I10 Essential (primary) hypertension: Secondary | ICD-10-CM | POA: Diagnosis not present

## 2023-12-08 LAB — CBC
HCT: 30.5 % — ABNORMAL LOW (ref 36.0–46.0)
Hemoglobin: 9.2 g/dL — ABNORMAL LOW (ref 12.0–15.0)
MCH: 23.3 pg — ABNORMAL LOW (ref 26.0–34.0)
MCHC: 30.2 g/dL (ref 30.0–36.0)
MCV: 77.2 fL — ABNORMAL LOW (ref 80.0–100.0)
Platelets: 373 10*3/uL (ref 150–400)
RBC: 3.95 MIL/uL (ref 3.87–5.11)
RDW: 15.4 % (ref 11.5–15.5)
WBC: 11.4 10*3/uL — ABNORMAL HIGH (ref 4.0–10.5)
nRBC: 0 % (ref 0.0–0.2)

## 2023-12-08 LAB — GLUCOSE, CAPILLARY
Glucose-Capillary: 146 mg/dL — ABNORMAL HIGH (ref 70–99)
Glucose-Capillary: 190 mg/dL — ABNORMAL HIGH (ref 70–99)

## 2023-12-08 LAB — CBG MONITORING, ED
Glucose-Capillary: 162 mg/dL — ABNORMAL HIGH (ref 70–99)
Glucose-Capillary: 141 mg/dL — ABNORMAL HIGH (ref 70–99)

## 2023-12-08 LAB — C-REACTIVE PROTEIN: CRP: 29.1 mg/dL — ABNORMAL HIGH (ref <1.0)

## 2023-12-08 LAB — SEDIMENTATION RATE: Sed Rate: 72 mm/h — ABNORMAL HIGH (ref 0–22)

## 2023-12-08 LAB — TROPONIN I (HIGH SENSITIVITY)
Troponin I (High Sensitivity): 14 ng/L (ref <18)
Troponin I (High Sensitivity): 16 ng/L (ref <18)

## 2023-12-08 LAB — CREATININE, SERUM
Creatinine, Ser: 0.99 mg/dL (ref 0.44–1.00)
GFR, Estimated: 59 mL/min — ABNORMAL LOW (ref >60)

## 2023-12-08 LAB — HEMOGLOBIN A1C
Hgb A1c MFr Bld: 6.6 % — ABNORMAL HIGH (ref 4.8–5.6)
Mean Plasma Glucose: 142.72 mg/dL

## 2023-12-08 LAB — HIV ANTIBODY (ROUTINE TESTING W REFLEX): HIV Screen 4th Generation wRfx: NONREACTIVE

## 2023-12-08 MED ORDER — ENOXAPARIN SODIUM 40 MG/0.4ML IJ SOSY: 40.0000 mg | PREFILLED_SYRINGE | INTRAMUSCULAR | Status: DC

## 2023-12-08 MED ORDER — AZITHROMYCIN 250 MG PO TABS
500.0000 mg | ORAL_TABLET | Freq: Every day | ORAL | Status: DC
  Administered 2023-12-08 – 2023-12-10 (×3): 500 mg via ORAL
  Filled 2023-12-08 (×4): qty 2

## 2023-12-08 MED ORDER — INSULIN ASPART 100 UNIT/ML IJ SOLN: 0.0000 [IU] | Freq: Every day | INTRAMUSCULAR | Status: DC

## 2023-12-08 MED ORDER — HYDRALAZINE HCL 50 MG PO TABS
100.0000 mg | ORAL_TABLET | Freq: Two times a day (BID) | ORAL | Status: DC
  Administered 2023-12-08 – 2023-12-10 (×6): 100 mg via ORAL
  Filled 2023-12-08 (×6): qty 2

## 2023-12-08 MED ORDER — ROSUVASTATIN CALCIUM 5 MG PO TABS
5.0000 mg | ORAL_TABLET | Freq: Every day | ORAL | Status: DC
  Administered 2023-12-08 – 2023-12-09 (×2): 5 mg via ORAL
  Filled 2023-12-08 (×2): qty 1

## 2023-12-08 MED ORDER — FOLIC ACID 1 MG PO TABS
1.0000 mg | ORAL_TABLET | Freq: Every morning | ORAL | Status: DC
  Administered 2023-12-08 – 2023-12-10 (×3): 1 mg via ORAL
  Filled 2023-12-08 (×2): qty 1

## 2023-12-08 MED ORDER — METOPROLOL TARTRATE 100 MG PO TABS
100.0000 mg | ORAL_TABLET | Freq: Two times a day (BID) | ORAL | Status: DC
  Administered 2023-12-08 – 2023-12-10 (×6): 100 mg via ORAL
  Filled 2023-12-08: qty 1
  Filled 2023-12-08 (×2): qty 4
  Filled 2023-12-08 (×3): qty 1

## 2023-12-08 MED ORDER — SODIUM CHLORIDE 0.9 % IV SOLN
INTRAVENOUS | Status: DC
Start: 2023-12-08 — End: 2023-12-10

## 2023-12-08 MED ORDER — INSULIN ASPART 100 UNIT/ML IJ SOLN
0.0000 [IU] | Freq: Three times a day (TID) | INTRAMUSCULAR | Status: DC
  Administered 2023-12-08: 3 [IU] via SUBCUTANEOUS
  Administered 2023-12-08 – 2023-12-09 (×2): 2 [IU] via SUBCUTANEOUS
  Administered 2023-12-09: 3 [IU] via SUBCUTANEOUS
  Administered 2023-12-09: 5 [IU] via SUBCUTANEOUS
  Administered 2023-12-10: 2 [IU] via SUBCUTANEOUS

## 2023-12-08 MED ORDER — APIXABAN 5 MG PO TABS
5.0000 mg | ORAL_TABLET | Freq: Two times a day (BID) | ORAL | Status: DC
  Administered 2023-12-08: 5 mg via ORAL
  Filled 2023-12-08: qty 1

## 2023-12-08 MED ORDER — SODIUM CHLORIDE 0.9 % IV SOLN
500.0000 mg | INTRAVENOUS | Status: DC
  Filled 2023-12-08: qty 5

## 2023-12-08 MED ORDER — METHIMAZOLE 10 MG PO TABS
10.0000 mg | ORAL_TABLET | Freq: Every day | ORAL | Status: DC
  Administered 2023-12-09 – 2023-12-10 (×2): 10 mg via ORAL
  Filled 2023-12-08 (×3): qty 1

## 2023-12-08 MED ORDER — ENOXAPARIN SODIUM 40 MG/0.4ML IJ SOSY
40.0000 mg | PREFILLED_SYRINGE | INTRAMUSCULAR | Status: DC
  Administered 2023-12-08: 40 mg via SUBCUTANEOUS
  Filled 2023-12-08: qty 0.4

## 2023-12-08 MED ORDER — ASPIRIN 325 MG PO TABS
650.0000 mg | ORAL_TABLET | Freq: Three times a day (TID) | ORAL | Status: DC
  Administered 2023-12-08 – 2023-12-10 (×7): 650 mg via ORAL
  Filled 2023-12-08 (×7): qty 2

## 2023-12-08 MED ORDER — ACETAMINOPHEN 500 MG PO TABS: 500.0000 mg | ORAL_TABLET | Freq: Four times a day (QID) | ORAL | Status: DC | PRN

## 2023-12-08 MED ORDER — COLCHICINE 0.6 MG PO TABS
0.6000 mg | ORAL_TABLET | Freq: Two times a day (BID) | ORAL | Status: DC
  Administered 2023-12-08 – 2023-12-10 (×6): 0.6 mg via ORAL
  Filled 2023-12-08 (×6): qty 1

## 2023-12-08 MED ORDER — SODIUM CHLORIDE 0.9 % IV SOLN
2.0000 g | INTRAVENOUS | Status: DC
  Administered 2023-12-08 – 2023-12-09 (×2): 2 g via INTRAVENOUS
  Filled 2023-12-08 (×2): qty 20

## 2023-12-08 MED ORDER — PANTOPRAZOLE SODIUM 40 MG PO TBEC
40.0000 mg | DELAYED_RELEASE_TABLET | Freq: Every day | ORAL | Status: DC
  Administered 2023-12-08 – 2023-12-10 (×3): 40 mg via ORAL
  Filled 2023-12-08 (×3): qty 1

## 2023-12-08 MED ORDER — IRBESARTAN 150 MG PO TABS
150.0000 mg | ORAL_TABLET | Freq: Every day | ORAL | Status: DC
  Administered 2023-12-09 – 2023-12-10 (×2): 150 mg via ORAL
  Filled 2023-12-08 (×3): qty 1

## 2023-12-08 MED ORDER — FERROUS SULFATE 325 (65 FE) MG PO TABS
325.0000 mg | ORAL_TABLET | Freq: Every day | ORAL | Status: DC
  Administered 2023-12-08 – 2023-12-10 (×3): 325 mg via ORAL
  Filled 2023-12-08 (×3): qty 1

## 2023-12-08 NOTE — ED Notes (Signed)
 Attempted to administer medications. Patient stating  I don't know why yall try to give me meds that I don't take at home. I don't take insulin , I take metformin . The  only time I take insulin  is when I'm here. Continued to go over medications. Pt replying I don't take that, I don't know what that is. Educated patient on medications and what they are for. Provider aware

## 2023-12-08 NOTE — Progress Notes (Addendum)
 PROGRESS NOTE    ERILYN [name]  FMW:[id number] DOB: Feb 22, 1946 DOA: 12/07/2023 PCP: Campbell Reynolds, NP   Brief Narrative: Samantha [name] is a 78 y.o. female with a history of diabetes, hypertension, CKD stage III, hyperlipidemia, hyperthyroidism, carpel tunnel, allergic rhinitis, atrial fibrillation.  Patient presented secondary to chest pain and found to have evidence of pneumonia and acute pericarditis. Antibiotics and colchicine started.   Assessment/Plan:  Community acquired pneumonia Airspace opacities noted on CT imaging in the lower lobes suggestive of pneumonia. Patient started on Ceftriaxone  and azithromycin on admission. -Continue Ceftriaxone  and azithromycin  Acute pericarditis Pericardial effusion Noted on CT imaging. Cardiology consulted and Transthoracic Echocardiogram ordered. Patient started on Colchicine. Transthoracic Echocardiogram significant for LVEF >75% with evidence of grade 1 diastolic dysfunction and trivial pericardial effusion.  Hyperthyroidism Patient is managed on methimazole  as an outpatient, but it does not appear patient has been adherent with her regimen. TSH is 0.017. -Continue methimazole  -Continue metoprolol   CKD stage IIIa Stable.  Diabetes mellitus type 2 Well controlled with hemoglobin A1C of 6.6%  Primary hypertension -Continue irbesartan and metoprolol   Hyperlipidemia Patient is on Crestor as an outpatient which was held on admission. -Resume Crestor  Paroxysmal atrial fibrillation Eliquis  discontinued by cardiology -Continue metoprolol   Anemia of chronic disease Stable.  Aortic atherosclerosis Noted on CT imaging. -Continue Crestor  Endometrial thickening Noted on CT imaging. It appears this was noted from last year as well. Patient will need to have this followed up.  Overweight Patient does not meet criteria for obesity.   DVT prophylaxis: Lovenox  Code Status:   Code Status: Full Code Family Communication: None at  bedside Disposition Plan: Discharge pending ongoing cardiology recommendations   Consultants:  Cardiology  Procedures:  Transthoracic Echocardiogram  Antimicrobials: Ceftriaxone  Azithromycin    Subjective: No dyspnea or chest pain this morning.  Objective: BP (!) 156/86   Pulse 86   Temp 98.4 F (36.9 C) (Oral)   Resp 18   Ht 5' 1 (1.549 m)   Wt 65.8 kg   SpO2 100%   BMI 27.40 kg/m   Examination:  General exam: Appears calm and comfortable Respiratory system: Clear to anterior auscultation. Respiratory effort normal. Cardiovascular system: S1 & S2 heard, sinus rhythm with infrequent PVCs, normal rate. Slight murmur. Gastrointestinal system: Abdomen is nondistended, soft and nontender. Normal bowel sounds heard. Central nervous system: Alert and oriented. No focal neurological deficits. Musculoskeletal: No edema. No calf tenderness Psychiatry: Judgement and insight appear normal. Mood & affect appropriate.    Data Reviewed: I have personally reviewed following labs and imaging studies   Last CBC Lab Results  Component Value Date   WBC 11.4 (H) 12/08/2023   HGB 9.2 (L) 12/08/2023   HCT 30.5 (L) 12/08/2023   MCV 77.2 (L) 12/08/2023   MCH 23.3 (L) 12/08/2023   RDW 15.4 12/08/2023   PLT 373 12/08/2023     Last metabolic panel Lab Results  Component Value Date   GLUCOSE 165 (H) 12/07/2023   NA 139 12/07/2023   K 3.9 12/07/2023   CL 108 12/07/2023   CO2 17 (L) 12/07/2023   BUN 11 12/07/2023   CREATININE 0.99 12/08/2023   GFRNONAA 59 (L) 12/08/2023   CALCIUM 9.3 12/07/2023   PHOS 4.5 01/30/2023   PROT 7.4 12/07/2023   ALBUMIN 3.0 (L) 12/07/2023   LABGLOB 2.5 09/12/2023   BILITOT 1.2 12/07/2023   ALKPHOS 89 12/07/2023   AST 12 (L) 12/07/2023   ALT 9 12/07/2023   ANIONGAP  14 12/07/2023     Creatinine Clearance: Estimated Creatinine Clearance: 41.3 mL/min (by C-G formula based on SCr of 0.99 mg/dL).  Recent Results (from the past 240 hours)   Resp panel by RT-PCR (RSV, Flu A&B, Covid) Urine, Clean Catch     Status: None   Collection Time: 12/07/23  3:11 PM   Specimen: Urine, Clean Catch; Nasal Swab  Result Value Ref Range Status   SARS Coronavirus 2 by RT PCR NEGATIVE NEGATIVE Final   Influenza A by PCR NEGATIVE NEGATIVE Final   Influenza B by PCR NEGATIVE NEGATIVE Final    Comment: (NOTE) The Xpert Xpress SARS-CoV-2/FLU/RSV plus assay is intended as an aid in the diagnosis of influenza from Nasopharyngeal swab specimens and should not be used as a sole basis for treatment. Nasal washings and aspirates are unacceptable for Xpert Xpress SARS-CoV-2/FLU/RSV testing.  Fact Sheet for Patients: BloggerCourse.com  Fact Sheet for Healthcare Providers: SeriousBroker.it  This test is not yet approved or cleared by the [place]  FDA and has been authorized for detection and/or diagnosis of SARS-CoV-2 by FDA under an Emergency Use Authorization (EUA). This EUA will remain in effect (meaning this test can be used) for the duration of the COVID-19 declaration under Section 564(b)(1) of the Act, 21 U.S.C. section 360bbb-3(b)(1), unless the authorization is terminated or revoked.     Resp Syncytial Virus by PCR NEGATIVE NEGATIVE Final    Comment: (NOTE) Fact Sheet for Patients: BloggerCourse.com  Fact Sheet for Healthcare Providers: SeriousBroker.it  This test is not yet approved or cleared by the [place]  FDA and has been authorized for detection and/or diagnosis of SARS-CoV-2 by FDA under an Emergency Use Authorization (EUA). This EUA will remain in effect (meaning this test can be used) for the duration of the COVID-19 declaration under Section 564(b)(1) of the Act, 21 U.S.C. section 360bbb-3(b)(1), unless the authorization is terminated or revoked.  Performed at [facility], [address] [place],  [place] [address]       Radiology Studies: CT Chest W Contrast Result Date: 12/07/2023 CLINICAL DATA:  Nonspecific chest pain. EXAM: CT CHEST WITH CONTRAST TECHNIQUE: Multidetector CT imaging of the chest was performed during intravenous contrast administration. RADIATION DOSE REDUCTION: This exam was performed according to the departmental dose-optimization program which includes automated exposure control, adjustment of the mA and/or kV according to patient size and/or use of iterative reconstruction technique. CONTRAST:  75mL OMNIPAQUE  IOHEXOL  350 MG/ML SOLN COMPARISON:  Same day CT abdomen pelvis and same day chest radiograph; CT chest 01/19/2023 FINDINGS: Cardiovascular: Small pericardial effusion with pericardial thickening and hyperenhancement compatible with pericarditis. Normal caliber thoracic aorta. No dissection. Mild aortic atherosclerotic calcification. Mediastinum/Nodes: Enlarged multinodular right thyroid  lobe. This was previously evaluated with ultrasound 10/20/2022. Trachea and esophagus are unremarkable. Trace stranding and free fluid in the anterior mediastinum. Lungs/Pleura: Small left greater than right pleural effusions. Airspace opacities in the lower lobes with central hypoattenuation suggesting pneumonia. No pneumothorax. Upper Abdomen: No acute abnormality. Musculoskeletal: No acute fracture. IMPRESSION: 1. Small pericardial effusion with pericardial thickening and hyperenhancement compatible with pericarditis. 2. Small left greater than right pleural effusions. Airspace opacities in the lower lobes with central hypoattenuation suggesting pneumonia. 3. Aortic Atherosclerosis (ICD10-I70.0). Electronically Signed   By: Norman Gatlin M.D.   On: 12/07/2023 22:41   CT ABDOMEN PELVIS W CONTRAST Result Date: 12/07/2023 CLINICAL DATA:  Chest pain, sepsis EXAM: CT ABDOMEN AND PELVIS WITH CONTRAST TECHNIQUE: Multidetector CT imaging of the abdomen and pelvis was performed  using the standard  protocol following bolus administration of intravenous contrast. RADIATION DOSE REDUCTION: This exam was performed according to the departmental dose-optimization program which includes automated exposure control, adjustment of the mA and/or kV according to patient size and/or use of iterative reconstruction technique. CONTRAST:  75mL OMNIPAQUE  IOHEXOL  350 MG/ML SOLN COMPARISON:  CT abdomen pelvis 01/19/2023 FINDINGS: Lower chest: Small bilateral pleural effusions. Associated atelectasis or pneumonia. Small pericardial effusion. Question pericardial thickening and hyperenhancement. Hepatobiliary: No acute abnormality. Pancreas: Unremarkable. Spleen: Unremarkable. Adrenals/Urinary Tract: Unremarkable adrenal glands and kidneys. No urinary calculi or hydronephrosis. Unremarkable bladder. Stomach/Bowel: No bowel obstruction or bowel wall thickening. Colonic diverticulosis without diverticulitis. Stomach and appendix are within normal limits. Vascular/Lymphatic: Mild aortic atherosclerotic calcification. No lymphadenopathy. Reproductive: Endometrial thickening measuring up to 7 mm. Bilateral adnexa are unremarkable. Other: No free intraperitoneal fluid or air. Musculoskeletal: No acute fracture. IMPRESSION: 1. Small pericardial effusion with possible thickening and hyperenhancement of the pericardium. Recommend CT chest with IV contrast for further evaluation for pericarditis. 2. Small bilateral pleural effusions. Associated atelectasis or pneumonia. 3. Endometrial thickening measuring 7 mm. Recommend nonemergent pelvic ultrasound for further evaluation. 4. No acute abnormality in the abdomen or pelvis. 5. Aortic Atherosclerosis (ICD10-I70.0). Electronically Signed   By: Norman Gatlin M.D.   On: 12/07/2023 20:34   DG Chest Portable 1 View Result Date: 12/07/2023 CLINICAL DATA:  pna? EXAM: PORTABLE CHEST - 1 VIEW COMPARISON:  Nov 09, 2023 FINDINGS: No focal airspace consolidation, pleural effusion, or pneumothorax.  Mild cardiomegaly. Aortic atherosclerosis. No acute fracture or destructive lesions. Multilevel thoracic osteophytosis. IMPRESSION: No acute cardiopulmonary abnormality. Electronically Signed   By: Rogelia Myers M.D.   On: 12/07/2023 15:42      LOS: 1 day    Elgin Lam, MD [facility] 12/08/2023, 7:45 AM   If 7PM-7AM, please contact night-coverage [email or web address]

## 2023-12-08 NOTE — Progress Notes (Signed)
 Cardiology Progress Note  Patient ID: Samantha [name] MRN: [id number] DOB: 04-15-1946 Date of Encounter: 12/08/2023 Primary Cardiologist: Maude Emmer, MD  Subjective   Chief Complaint: Chest pain  HPI: ESR elevated and CRP. Pleuritic CP reported.   ROS:  All other ROS reviewed and negative. Pertinent positives noted in the HPI.     Telemetry  Overnight telemetry shows SR 90s, PVCs, which I personally reviewed.   ECG  The most recent ECG shows sinus rhythm heart 99, PVCs, nonspecific ST-T changes, which I personally reviewed.   Physical Exam   Vitals:   12/08/23 0300 12/08/23 0345 12/08/23 0500 12/08/23 0600  BP: 125/73 126/72 (!) 151/78 (!) 156/86  Pulse: 91 91 90 86  Resp: 12 20 19 18   Temp:      TempSrc:      SpO2: 99% 100% 100% 100%  Weight:      Height:        Intake/Output Summary (Last 24 hours) at 12/08/2023 0820 Last data filed at 12/08/2023 0108 Gross per 24 hour  Intake 350 ml  Output --  Net 350 ml       12/07/2023    2:26 PM 11/22/2023    3:42 PM 11/09/2023   12:35 PM  Last 3 Weights  Weight (lbs) 145 lb 154 lb 3.2 oz 150 lb 2.1 oz  Weight (kg) 65.772 kg 69.945 kg 68.1 kg    Body mass index is 27.4 kg/m.  General: Well nourished, well developed, in no acute distress Head: Atraumatic, normal size  Eyes: PEERLA, EOMI  Neck: Supple, no JVD Endocrine: No thryomegaly Cardiac: Normal S1, S2; RRR; no murmurs, rubs, or gallops Lungs: Diminished breath sounds bilaterally Abd: Soft, nontender, no hepatomegaly  Ext: No edema, pulses 2+ Musculoskeletal: No deformities, BUE and BLE strength normal and equal Skin: Warm and dry, no rashes   Neuro: Alert and oriented to person, place, time, and situation, CNII-XII grossly intact, no focal deficits  Psych: Normal mood and affect   Cardiac Studies  TTE 01/24/2023  1. Elevated LVOT peak velocity of 4 m/s likely due to hyperdynamic LV  function and chordal SAM.   2. Left ventricular ejection fraction, by  estimation, is >75%. The left  ventricle has hyperdynamic function. The left ventricle has no regional  wall motion abnormalities. There is mild concentric left ventricular  hypertrophy. Left ventricular diastolic  parameters are consistent with Grade I diastolic dysfunction (impaired  relaxation).   3. Right ventricular systolic function is normal. The right ventricular  size is normal.   4. The mitral valve is normal in structure. No evidence of mitral valve  regurgitation. No evidence of mitral stenosis.   5. The aortic valve is normal in structure. Aortic valve regurgitation is  not visualized. No aortic stenosis is present.   6. The inferior vena cava is dilated in size with >50% respiratory  variability, suggesting right atrial pressure of 8 mmHg.   Patient Profile  Samantha [name] is a 78 y.o. female with paroxysmal atrial fibrillation, hypertension, diabetes, CKD stage IIIa, hyperthyroidism admitted on 12/07/2023 with pneumonia and acute pericarditis.  Cardiology consulted for pericardial effusion and pericarditis.  Assessment & Plan   # Acute pericarditis # Pericardial effusion without tamponade - Describes symptoms for 1 month of pleuritic chest discomfort.  EKG not classic for pericarditis but CRP and ESR are elevated.  We will presumptively treat this for pericarditis. - Given CKD stage IIIa-IIIb we will pursue aspirin  for anti-inflammation.  650 mg of  aspirin  3 times daily for 7 days followed by 325 mg 3 times daily for 7 days then stop.  We will need to make sure her inflammatory markers are normal before stopping this. - Continue colchicine 0.6 mg twice daily. - She does have a pericardial effusion that is small on CT.  No signs of tamponade clinically.  I have ordered an echocardiogram.  Suspect this will be clinically insignificant. - Would recommend treatment for possible pneumonia per primary team. - She does need adjustment of her thyroid  medications.  Defer to hospital  medicine. - I will hold her anticoagulation while she is on high-dose aspirin .  She is not in A-fib.  This is fine to do.  I do not want to increase her bleeding risk.  I have also added Protonix 40 mg daily for GI protection while on high-dose aspirin . - She will need close follow-up as an outpatient.  Cardiology to follow along.     For questions or updates, please contact [facility] Please consult [email or web address] for contact info under        Signed, Darryle T. Barbaraann, MD, [facility]  [facility]  12/08/2023 8:20 AM

## 2023-12-08 NOTE — H&P (Signed)
 History and Physical    Patient: Samantha [name] FMW:[id number] DOB: Nov 18, 1945 DOA: 12/07/2023 DOS: the patient was seen and examined on 12/08/2023 PCP: Campbell Reynolds, NP  Patient coming from: Home  Chief Complaint:  Chief Complaint  Patient presents with   Chest Pain   HPI: Samantha [name] is a 78 y.o. female with medical history significant of diabetes, essential hypertension, chronic kidney disease stage III, hyperlipidemia, morbid obesity, hypothyroidism, carpal tunnel syndrome, allergic rhinitis, atrial fibrillation on apixaban  who presented to the ER with chest pain, occasional cough, fever as well as loss of appetite.  Patient described the chest pain as sharp associated with some mild shortness of breath.  She does not require oxygen.  Pain was 6 out of 10.  The cough is nonproductive and is intermittent.  No hemoptysis.  Patient was evaluated in the ER.  His chest x-ray showed no acute findings.  Subsequent CT angiogram of the chest showed no PE but potential pericarditis as well as pneumonia.  At this point patient will be admitted to the medical service with cardiology consultation.  Review of Systems: As mentioned in the history of present illness. All other systems reviewed and are negative. Past Medical History:  Diagnosis Date   Allergic rhinitis    Arthritis of both knees    Arthritis of both shoulder regions    Bilateral carpal tunnel syndrome    Cataract    Chronic low back pain    CKD (chronic kidney disease), stage III (HCC)    Decreased vision    Diabetes mellitus    HTN (hypertension)    Hyperlipidemia    Morbid obesity (HCC)    Thyroid  disease    Past Surgical History:  Procedure Laterality Date   CATARACT EXTRACTION     COLONOSCOPY     COLONOSCOPY N/A 09/15/2020   Procedure: COLONOSCOPY;  Surgeon: Shaaron Lamar HERO, MD;  Location: [facility];  Service: Endoscopy;  Laterality: N/A;  ASA II / PM procedure   POLYPECTOMY  09/15/2020   Procedure: POLYPECTOMY;  Surgeon:  Shaaron Lamar HERO, MD;  Location: [facility];  Service: Endoscopy;;   TUBAL LIGATION     Social History:  reports that she has never smoked. She has never used smokeless tobacco. She reports current alcohol use. She reports that she does not use drugs.  No Known Allergies  Family History  Problem Relation Age of Onset   Hypertension Father    Hypertension Mother    Diabetes Mother    Breast cancer Mother    Hypertension Sister    Cancer Sister        pancreatic   Cancer Sister        pancreatic   Liver disease Sister     Prior to Admission medications   Medication Sig Start Date End Date Taking? Authorizing Provider  acetaminophen  (TYLENOL ) 500 MG tablet Take 500-1,000 mg by mouth every 6 (six) hours as needed for mild pain.    [provider]  apixaban  (ELIQUIS ) 5 MG TABS tablet Take 1 tablet (5 mg total) by mouth 2 (two) times daily. 02/01/23   Ghimire, Donalda HERO, MD  ferrous sulfate  325 (65 FE) MG tablet Take 325 mg by mouth daily. 09/04/23   [provider]  folic acid (FOLVITE) 1 MG tablet Take 1 mg by mouth every morning. 06/01/23   [provider]  hydrALAZINE  (APRESOLINE ) 100 MG tablet Take 100 mg by mouth 2 (two) times daily. 06/29/23   [provider]  metFORMIN  (GLUCOPHAGE -XR) 500 MG 24 hr tablet Take 1 tablet (500 mg total) by mouth 2 (two) times daily with a meal. Patient taking differently: Take 500 mg by mouth daily with breakfast. 03/12/23   Arellano Zameza, Priscila, MD  methimazole  (TAPAZOLE ) 10 MG tablet Take 1 tablet (10 mg total) by mouth daily with breakfast. 09/14/23   Lenis Ethelle ORN, MD  metoprolol  tartrate (LOPRESSOR ) 100 MG tablet Take 100 mg by mouth 2 (two) times daily. 06/21/23   [provider]  spironolactone  (ALDACTONE ) 25 MG tablet Take 0.5 tablets (12.5 mg total) by mouth daily. Patient taking differently: Take 25 mg by mouth daily. 07/06/23 11/22/23  Vicci Rollo SAUNDERS, PA-C  valsartan  (DIOVAN ) 160 MG  tablet TAKE 1 TABLET BY MOUTH EVERY DAY 10/04/23   Vicci Rollo SAUNDERS, [place]    Physical Exam: Vitals:   12/07/23 1825 12/07/23 1932 12/07/23 2230 12/07/23 2233  BP:  (!) 153/89 (!) 173/92   Pulse:  (!) 103 98   Resp:  18 12   Temp: 98.6 F (37 C)   98.5 F (36.9 C)  TempSrc: Oral   Oral  SpO2:  100% 99%   Weight:      Height:       Constitutional: Acutely ill looking, NAD, calm, comfortable Eyes: PERRL, lids and conjunctivae normal ENMT: Mucous membranes are moist. Posterior pharynx clear of any exudate or lesions.Normal dentition.  Neck: normal, supple, no masses, no thyromegaly Respiratory: clear to auscultation bilaterally, no wheezing, no crackles. Normal respiratory effort. No accessory muscle use.  Cardiovascular: Irregularly irregular with tachycardia, no murmurs / rubs / gallops. No extremity edema. 2+ pedal pulses. No carotid bruits.  Abdomen: no tenderness, no masses palpated. No hepatosplenomegaly. Bowel sounds positive.  Musculoskeletal: Good range of motion, no joint swelling or tenderness, Skin: no rashes, lesions, ulcers. No induration Neurologic: CN 2-12 grossly intact. Sensation intact, DTR normal. Strength 5/5 in all 4.  Psychiatric: Normal judgment and insight. Alert and oriented x 3. Normal mood  Data Reviewed:  Temperature 103, blood pressure 152/75, pulse 110 respiratory 22 oxygen sats 99% on room air. White count 9.3 hemoglobin 10.5 platelets 388.  Creatinine 1.19 CO2 17 GFR is 47.  MCV of 79.8.  Acute viral screen is negative.  TSH is 0.017 Free T4 of 2.72 chest x-ray showed no acute cardiopulmonary disease.  EKG showed sinus tachycardia and nonspecific ST changes.  CT abdomen pelvis shows small pericardial effusion with possible thickening on her biopsy of the pericardium.  Small bilateral pleural effusions endometrial thickening CT chest with contrast showed small pericardial effusion with pericardial thickening and [place] vaginal compartment with  pericarditis and small left greater than right pleural effusions with findings in the lower lobes suggestive of pneumonia chest x-ray-no acute cardiopulmonary abnormalities.  Assessment and Plan:  #1 community-acquired pneumonia: Patient will be admitted for treatment of community-acquired pneumonia with IV Rocephin  and Zithromax.  Patient does not require oxygen at the moment.  Appears to be early.  Only seen on CT.  Follow-up blood cultures on adjust antibiotics as necessary.  #2 pericarditis: Suspected pericarditis with some mild pericardial effusion.  Patient will need echocardiogram to further characterize it.  ER has consulted cardiologist.  Will follow.  Possibly NSAIDs for the pain.  #3 hyperthyroidism: Patient on methimazole .  Will continue  #4 chronic kidney disease stage III: Continue to monitor.  Renal function essentially at baseline.  #5 type 2 diabetes: Sliding scale insulin .  #6 essential hypertension: Confirm on  resume home regimen  #7 hyperlipidemia: Continue statin  #8 paroxysmal atrial fibrillation: Continue apixaban  and rate control  #9 anemia of chronic disease: Continue to monitor H&H  #10 morbid obesity: Dietary counseling.    Advance Care Planning:   Code Status: Prior full code  Consults: Cardiology consult from the ER  Family Communication: No family at bedside  Severity of Illness: The appropriate patient status for this patient is INPATIENT. Inpatient status is judged to be reasonable and necessary in order to provide the required intensity of service to ensure the patient's safety. The patient's presenting symptoms, physical exam findings, and initial radiographic and laboratory data in the context of their chronic comorbidities is felt to place them at high risk for further clinical deterioration. Furthermore, it is not anticipated that the patient will be medically stable for discharge from the hospital within 2 midnights of admission.   * I certify  that at the point of admission it is my clinical judgment that the patient will require inpatient hospital care spanning beyond 2 midnights from the point of admission due to high intensity of service, high risk for further deterioration and high frequency of surveillance required.*  AuthorBETHA SIM KNOLL, MD 12/08/2023 12:19 AM  For on call review [email or web address].

## 2023-12-09 ENCOUNTER — Inpatient Hospital Stay (HOSPITAL_COMMUNITY)

## 2023-12-09 DIAGNOSIS — I300 Acute nonspecific idiopathic pericarditis: Secondary | ICD-10-CM

## 2023-12-09 DIAGNOSIS — I10 Essential (primary) hypertension: Secondary | ICD-10-CM | POA: Diagnosis not present

## 2023-12-09 DIAGNOSIS — J189 Pneumonia, unspecified organism: Secondary | ICD-10-CM | POA: Diagnosis not present

## 2023-12-09 DIAGNOSIS — I3139 Other pericardial effusion (noninflammatory): Secondary | ICD-10-CM

## 2023-12-09 DIAGNOSIS — E0590 Thyrotoxicosis, unspecified without thyrotoxic crisis or storm: Secondary | ICD-10-CM

## 2023-12-09 DIAGNOSIS — N1831 Chronic kidney disease, stage 3a: Secondary | ICD-10-CM | POA: Diagnosis not present

## 2023-12-09 DIAGNOSIS — I309 Acute pericarditis, unspecified: Secondary | ICD-10-CM | POA: Diagnosis not present

## 2023-12-09 LAB — GLUCOSE, CAPILLARY
Glucose-Capillary: 137 mg/dL — ABNORMAL HIGH (ref 70–99)
Glucose-Capillary: 203 mg/dL — ABNORMAL HIGH (ref 70–99)
Glucose-Capillary: 163 mg/dL — ABNORMAL HIGH (ref 70–99)
Glucose-Capillary: 145 mg/dL — ABNORMAL HIGH (ref 70–99)
Glucose-Capillary: 141 mg/dL — ABNORMAL HIGH (ref 70–99)

## 2023-12-09 LAB — BASIC METABOLIC PANEL WITH GFR
Anion gap: 11 (ref 5–15)
BUN: 15 mg/dL (ref 8–23)
CO2: 17 mmol/L — ABNORMAL LOW (ref 22–32)
Calcium: 8.7 mg/dL — ABNORMAL LOW (ref 8.9–10.3)
Chloride: 108 mmol/L (ref 98–111)
Creatinine, Ser: 1.29 mg/dL — ABNORMAL HIGH (ref 0.44–1.00)
GFR, Estimated: 43 mL/min — ABNORMAL LOW (ref >60)
Glucose, Bld: 222 mg/dL — ABNORMAL HIGH (ref 70–99)
Potassium: 3.6 mmol/L (ref 3.5–5.1)
Sodium: 136 mmol/L (ref 135–145)

## 2023-12-09 LAB — CBC
HCT: 31.3 % — ABNORMAL LOW (ref 36.0–46.0)
Hemoglobin: 9.6 g/dL — ABNORMAL LOW (ref 12.0–15.0)
MCH: 23.9 pg — ABNORMAL LOW (ref 26.0–34.0)
MCHC: 30.7 g/dL (ref 30.0–36.0)
MCV: 77.9 fL — ABNORMAL LOW (ref 80.0–100.0)
Platelets: 437 10*3/uL — ABNORMAL HIGH (ref 150–400)
RBC: 4.02 MIL/uL (ref 3.87–5.11)
RDW: 15.5 % (ref 11.5–15.5)
WBC: 9.5 10*3/uL (ref 4.0–10.5)
nRBC: 0 % (ref 0.0–0.2)

## 2023-12-09 LAB — ECHOCARDIOGRAM COMPLETE
AR max vel: 1.7 cm2
AV Area VTI: 1.68 cm2
AV Area mean vel: 1.64 cm2
AV Mean grad: 5.5 mmHg
AV Peak grad: 10.3 mmHg
Ao pk vel: 1.6 m/s
Area-P 1/2: 4.07 cm2
Calc EF: 61.4 %
Height: 61 in
S' Lateral: 2.7 cm
Single Plane A2C EF: 62 %
Single Plane A4C EF: 64.5 %
Weight: 2320 [oz_av]

## 2023-12-09 NOTE — Progress Notes (Signed)
 PROGRESS NOTE    VANDELLA ORD  FMW:[id number] DOB: 03/14/1946 DOA: 12/07/2023 PCP: Campbell Reynolds, NP   Brief Narrative: Samantha [name] is a 78 y.o. female with a history of diabetes, hypertension, CKD stage III, hyperlipidemia, hyperthyroidism, carpel tunnel, allergic rhinitis, atrial fibrillation.  Patient presented secondary to chest pain and found to have evidence of pneumonia and acute pericarditis. Antibiotics and colchicine started.   Assessment/Plan:  Community acquired pneumonia Airspace opacities noted on CT imaging in the lower lobes suggestive of pneumonia. Patient started on Ceftriaxone  and azithromycin on admission. No symptoms consistent with pneumonia, so unsure if true infection. -Continue Ceftriaxone  and azithromycin  Acute pericarditis Pericardial effusion Noted on CT imaging. Cardiology consulted and Transthoracic Echocardiogram ordered. Patient started on Colchicine. Last Transthoracic Echocardiogram (01/24/2024) significant for LVEF >75% with evidence of grade 1 diastolic dysfunction and trivial pericardial effusion. CRP and ESR elevated. -Follow-up Transthoracic Echocardiogram this admission -Cardiology recommendations: continue colchicine, hold Eliquis , starting aspirin  with taper  Hyperthyroidism Patient is managed on methimazole  as an outpatient. Confirmed with patient that she is adherent with her regimen. TSH is 0.017. She was previously on high dose methimazole  30 mg daily, decreased to 20 mg and finally to 10 mg. -Continue methimazole ; recommend calling patient's endocrinologist in AM to obtain recommendations on dose adjustment -Continue metoprolol   CKD stage IIIa Stable.  Diabetes mellitus type 2 Well controlled with hemoglobin A1C of 6.6%  Primary hypertension -Continue irbesartan and metoprolol   Hyperlipidemia Patient is on Crestor as an outpatient which was held on admission. -Continue Crestor  Paroxysmal atrial fibrillation Eliquis   discontinued by cardiology -Continue metoprolol   Anemia of chronic disease Stable.  Aortic atherosclerosis Noted on CT imaging. -Continue Crestor  Endometrial thickening Noted on CT imaging. It appears this was noted from last year as well. Patient is currently asymptomatic. Patient will need to have this followed up since asymptomatic.  Overweight Patient does not meet criteria for obesity.   DVT prophylaxis: Eliquis  held. SCDs Code Status:   Code Status: Full Code Family Communication: None at bedside Disposition Plan: Discharge pending ongoing cardiology recommendations   Consultants:  Cardiology  Procedures:  Transthoracic Echocardiogram (pending)  Antimicrobials: Ceftriaxone  Azithromycin    Subjective: Patient tells me she has no chest pain or dyspnea at this time. No other issues from overnight. We discussed the findings of endometrial thickening. She states she has no recent history of vaginal bleeding and no history of pelvic pain.  Objective: BP 120/67 (BP Location: Right Arm)   Pulse 88   Temp 99.2 F (37.3 C) (Oral)   Resp 18   Ht 5' 1 (1.549 m)   Wt 65.8 kg   SpO2 100%   BMI 27.40 kg/m   Examination:  General exam: Appears calm and comfortable Respiratory system: Clear to auscultation. Respiratory effort normal. Cardiovascular system: S1 & S2 heard, RRR. Systolic murmur Gastrointestinal system: Abdomen is nondistended, soft and nontender. Normal bowel sounds heard. Central nervous system: Alert and oriented. No focal neurological deficits. Musculoskeletal: No edema. No calf tenderness Psychiatry: Judgement and insight appear normal. Mood & affect appropriate.    Data Reviewed: I have personally reviewed following labs and imaging studies   Last CBC Lab Results  Component Value Date   WBC 11.4 (H) 12/08/2023   HGB 9.2 (L) 12/08/2023   HCT 30.5 (L) 12/08/2023   MCV 77.2 (L) 12/08/2023   MCH 23.3 (L) 12/08/2023   RDW 15.4 12/08/2023    PLT 373 12/08/2023     Last metabolic  panel Lab Results  Component Value Date   GLUCOSE 165 (H) 12/07/2023   NA 139 12/07/2023   K 3.9 12/07/2023   CL 108 12/07/2023   CO2 17 (L) 12/07/2023   BUN 11 12/07/2023   CREATININE 0.99 12/08/2023   GFRNONAA 59 (L) 12/08/2023   CALCIUM 9.3 12/07/2023   PHOS 4.5 01/30/2023   PROT 7.4 12/07/2023   ALBUMIN 3.0 (L) 12/07/2023   LABGLOB 2.5 09/12/2023   BILITOT 1.2 12/07/2023   ALKPHOS 89 12/07/2023   AST 12 (L) 12/07/2023   ALT 9 12/07/2023   ANIONGAP 14 12/07/2023     Creatinine Clearance: Estimated Creatinine Clearance: 41.3 mL/min (by C-G formula based on SCr of 0.99 mg/dL).  Recent Results (from the past 240 hours)  Resp panel by RT-PCR (RSV, Flu A&B, Covid) Urine, Clean Catch     Status: None   Collection Time: 12/07/23  3:11 PM   Specimen: Urine, Clean Catch; Nasal Swab  Result Value Ref Range Status   SARS Coronavirus 2 by RT PCR NEGATIVE NEGATIVE Final   Influenza A by PCR NEGATIVE NEGATIVE Final   Influenza B by PCR NEGATIVE NEGATIVE Final    Comment: (NOTE) The Xpert Xpress SARS-CoV-2/FLU/RSV plus assay is intended as an aid in the diagnosis of influenza from Nasopharyngeal swab specimens and should not be used as a sole basis for treatment. Nasal washings and aspirates are unacceptable for Xpert Xpress SARS-CoV-2/FLU/RSV testing.  Fact Sheet for Patients: BloggerCourse.com  Fact Sheet for Healthcare Providers: SeriousBroker.it  This test is not yet approved or cleared by the [place]  FDA and has been authorized for detection and/or diagnosis of SARS-CoV-2 by FDA under an Emergency Use Authorization (EUA). This EUA will remain in effect (meaning this test can be used) for the duration of the COVID-19 declaration under Section 564(b)(1) of the Act, 21 U.S.C. section 360bbb-3(b)(1), unless the authorization is terminated or revoked.     Resp Syncytial Virus by  PCR NEGATIVE NEGATIVE Final    Comment: (NOTE) Fact Sheet for Patients: BloggerCourse.com  Fact Sheet for Healthcare Providers: SeriousBroker.it  This test is not yet approved or cleared by the [place]  FDA and has been authorized for detection and/or diagnosis of SARS-CoV-2 by FDA under an Emergency Use Authorization (EUA). This EUA will remain in effect (meaning this test can be used) for the duration of the COVID-19 declaration under Section 564(b)(1) of the Act, 21 U.S.C. section 360bbb-3(b)(1), unless the authorization is terminated or revoked.  Performed at [facility], [address] [place], [place] [address]       Radiology Studies: CT Chest W Contrast Result Date: 12/07/2023 CLINICAL DATA:  Nonspecific chest pain. EXAM: CT CHEST WITH CONTRAST TECHNIQUE: Multidetector CT imaging of the chest was performed during intravenous contrast administration. RADIATION DOSE REDUCTION: This exam was performed according to the departmental dose-optimization program which includes automated exposure control, adjustment of the mA and/or kV according to patient size and/or use of iterative reconstruction technique. CONTRAST:  75mL OMNIPAQUE  IOHEXOL  350 MG/ML SOLN COMPARISON:  Same day CT abdomen pelvis and same day chest radiograph; CT chest 01/19/2023 FINDINGS: Cardiovascular: Small pericardial effusion with pericardial thickening and hyperenhancement compatible with pericarditis. Normal caliber thoracic aorta. No dissection. Mild aortic atherosclerotic calcification. Mediastinum/Nodes: Enlarged multinodular right thyroid  lobe. This was previously evaluated with ultrasound 10/20/2022. Trachea and esophagus are unremarkable. Trace stranding and free fluid in the anterior mediastinum. Lungs/Pleura: Small left greater than right pleural effusions. Airspace opacities in the lower lobes with  central hypoattenuation suggesting pneumonia. No  pneumothorax. Upper Abdomen: No acute abnormality. Musculoskeletal: No acute fracture. IMPRESSION: 1. Small pericardial effusion with pericardial thickening and hyperenhancement compatible with pericarditis. 2. Small left greater than right pleural effusions. Airspace opacities in the lower lobes with central hypoattenuation suggesting pneumonia. 3. Aortic Atherosclerosis (ICD10-I70.0). Electronically Signed   By: Norman Gatlin M.D.   On: 12/07/2023 22:41   CT ABDOMEN PELVIS W CONTRAST Result Date: 12/07/2023 CLINICAL DATA:  Chest pain, sepsis EXAM: CT ABDOMEN AND PELVIS WITH CONTRAST TECHNIQUE: Multidetector CT imaging of the abdomen and pelvis was performed using the standard protocol following bolus administration of intravenous contrast. RADIATION DOSE REDUCTION: This exam was performed according to the departmental dose-optimization program which includes automated exposure control, adjustment of the mA and/or kV according to patient size and/or use of iterative reconstruction technique. CONTRAST:  75mL OMNIPAQUE  IOHEXOL  350 MG/ML SOLN COMPARISON:  CT abdomen pelvis 01/19/2023 FINDINGS: Lower chest: Small bilateral pleural effusions. Associated atelectasis or pneumonia. Small pericardial effusion. Question pericardial thickening and hyperenhancement. Hepatobiliary: No acute abnormality. Pancreas: Unremarkable. Spleen: Unremarkable. Adrenals/Urinary Tract: Unremarkable adrenal glands and kidneys. No urinary calculi or hydronephrosis. Unremarkable bladder. Stomach/Bowel: No bowel obstruction or bowel wall thickening. Colonic diverticulosis without diverticulitis. Stomach and appendix are within normal limits. Vascular/Lymphatic: Mild aortic atherosclerotic calcification. No lymphadenopathy. Reproductive: Endometrial thickening measuring up to 7 mm. Bilateral adnexa are unremarkable. Other: No free intraperitoneal fluid or air. Musculoskeletal: No acute fracture. IMPRESSION: 1. Small pericardial effusion with  possible thickening and hyperenhancement of the pericardium. Recommend CT chest with IV contrast for further evaluation for pericarditis. 2. Small bilateral pleural effusions. Associated atelectasis or pneumonia. 3. Endometrial thickening measuring 7 mm. Recommend nonemergent pelvic ultrasound for further evaluation. 4. No acute abnormality in the abdomen or pelvis. 5. Aortic Atherosclerosis (ICD10-I70.0). Electronically Signed   By: Norman Gatlin M.D.   On: 12/07/2023 20:34   DG Chest Portable 1 View Result Date: 12/07/2023 CLINICAL DATA:  pna? EXAM: PORTABLE CHEST - 1 VIEW COMPARISON:  Nov 09, 2023 FINDINGS: No focal airspace consolidation, pleural effusion, or pneumothorax. Mild cardiomegaly. Aortic atherosclerosis. No acute fracture or destructive lesions. Multilevel thoracic osteophytosis. IMPRESSION: No acute cardiopulmonary abnormality. Electronically Signed   By: Rogelia Myers M.D.   On: 12/07/2023 15:42      LOS: 2 days    Elgin Lam, MD [facility] 12/09/2023, 7:34 AM   If 7PM-7AM, please contact night-coverage [email or web address]

## 2023-12-09 NOTE — Progress Notes (Signed)
 Ambulatory Oxygen:  Resting on RA: 100% Ambulating on RA: 98%

## 2023-12-09 NOTE — Progress Notes (Signed)
 Cardiology Progress Note  Patient ID: Samantha [name] MRN: [id number] DOB: [birth year] Date of Encounter: 12/09/2023 Primary Cardiologist: Maude Emmer, MD  Subjective   Chief Complaint: Chest pain  HPI: Still having chest discomfort.  Reports it is slightly improved.  Low energy reported as well.  ROS:  All other ROS reviewed and negative. Pertinent positives noted in the HPI.     Telemetry  Overnight telemetry shows sinus rhythm 90s, which I personally reviewed.    Physical Exam   Vitals:   12/09/23 0036 12/09/23 0426 12/09/23 0843 12/09/23 0929  BP: 125/68 120/67 (!) 153/74 (!) 153/74  Pulse: 86 88  88  Resp: 16 18 18    Temp: 98.9 F (37.2 C) 99.2 F (37.3 C) 98.8 F (37.1 C)   TempSrc: Oral Oral Oral   SpO2: 96% 100%    Weight:      Height:       No intake or output data in the 24 hours ending 12/09/23 1002     12/07/2023    2:26 PM 11/22/2023    3:42 PM 11/09/2023   12:35 PM  Last 3 Weights  Weight (lbs) 145 lb 154 lb 3.2 oz 150 lb 2.1 oz  Weight (kg) 65.772 kg 69.945 kg 68.1 kg    Body mass index is 27.4 kg/m.  General: Well nourished, well developed, in no acute distress Head: Atraumatic, normal size  Eyes: PEERLA, EOMI  Neck: Supple, no JVD Endocrine: No thryomegaly Cardiac: Normal S1, S2; RRR; no murmurs, rubs, or gallops Lungs: Diminished breath sounds bilaterally Abd: Soft, nontender, no hepatomegaly  Ext: No edema, pulses 2+ Musculoskeletal: No deformities, BUE and BLE strength normal and equal Skin: Warm and dry, no rashes   Neuro: Alert and oriented to person, place, time, and situation, CNII-XII grossly intact, no focal deficits  Psych: Normal mood and affect   Cardiac Studies  TTE 01/24/2023 1. Elevated LVOT peak velocity of 4 m/s likely due to hyperdynamic LV  function and chordal SAM.   2. Left ventricular ejection fraction, by estimation, is >75%. The left  ventricle has hyperdynamic function. The left ventricle has no regional  wall motion  abnormalities. There is mild concentric left ventricular  hypertrophy. Left ventricular diastolic  parameters are consistent with Grade I diastolic dysfunction (impaired  relaxation).   3. Right ventricular systolic function is normal. The right ventricular  size is normal.   4. The mitral valve is normal in structure. No evidence of mitral valve  regurgitation. No evidence of mitral stenosis.   5. The aortic valve is normal in structure. Aortic valve regurgitation is  not visualized. No aortic stenosis is present.   6. The inferior vena cava is dilated in size with >50% respiratory  variability, suggesting right atrial pressure of 8 mmHg.   Patient Profile  Samantha [name] is a 78 y.o. female with paroxysmal atrial fibrillation, hypertension, diabetes, CKD stage IIIa, hyperthyroidism admitted on 12/07/2023 with pneumonia and acute pericarditis.  Cardiology consulted for pericardial effusion and acute pericarditis.  Assessment & Plan   # Acute pericarditis # Pericardial effusion without tamponade - Describes 1 month of low energy as well as pleuritic chest discomfort. - CRP and ESR elevated. - Chest CT shows pericardial effusion with pericardial thickening. - She describes somewhat of chest discomfort.  Working diagnosis is acute pericarditis. - We have elected to treat her with aspirin  650 mg 3 times daily for 7 days followed by 325 mg 3 times daily for 7 days.  Continue colchicine 0.6 mg twice daily.  CRP and ESR significantly elevated.  She will need close follow-up in the outpatient setting to make sure inflammatory markers have normalized before stopping anti-inflammatories. - Still describes some chest discomfort but is improving.  Hopefully she will notice more improvement tomorrow. - On antibiotics per primary team for possible pneumonia. - We did discuss that her energy level will take time to recover. - Follow-up echo which is planned for today.  Again no signs of tamponade.  BP is  normal. - I am holding her home Eliquis  in the setting of acute pericarditis.  I have added Protonix for GI protection.  # PNA - per primary team.   # Paroxysmal A-fib - Holding Eliquis  in the setting of acute pericarditis.  No recurrence of A-fib. - Continue home metoprolol .  # HLD - Home statin  # HTN - Home BP medications.  Per primary team.  # Hyperthyroidism -home methimazole       For questions or updates, please contact [facility] Please consult [email or web address] for contact info under      Signed, Darryle T. Barbaraann, MD, [facility] [place]  [facility]  12/09/2023 10:02 AM

## 2023-12-09 NOTE — Plan of Care (Signed)

## 2023-12-09 NOTE — Progress Notes (Signed)
  Echocardiogram 2D Echocardiogram has been performed.  Devora Ellouise SAUNDERS 12/09/2023, 3:32 PM

## 2023-12-10 ENCOUNTER — Telehealth: Payer: Self-pay | Admitting: Cardiovascular Disease

## 2023-12-10 DIAGNOSIS — E782 Mixed hyperlipidemia: Secondary | ICD-10-CM

## 2023-12-10 DIAGNOSIS — N1831 Chronic kidney disease, stage 3a: Secondary | ICD-10-CM | POA: Diagnosis not present

## 2023-12-10 DIAGNOSIS — I309 Acute pericarditis, unspecified: Secondary | ICD-10-CM | POA: Diagnosis not present

## 2023-12-10 DIAGNOSIS — E0590 Thyrotoxicosis, unspecified without thyrotoxic crisis or storm: Secondary | ICD-10-CM | POA: Diagnosis not present

## 2023-12-10 DIAGNOSIS — I300 Acute nonspecific idiopathic pericarditis: Secondary | ICD-10-CM | POA: Diagnosis not present

## 2023-12-10 DIAGNOSIS — J189 Pneumonia, unspecified organism: Secondary | ICD-10-CM | POA: Diagnosis not present

## 2023-12-10 LAB — GLUCOSE, CAPILLARY: Glucose-Capillary: 147 mg/dL — ABNORMAL HIGH (ref 70–99)

## 2023-12-10 MED ORDER — COLCHICINE 0.6 MG PO TABS: ORAL_TABLET | ORAL | 0 refills | Status: DC

## 2023-12-10 MED ORDER — PANTOPRAZOLE SODIUM 40 MG PO TBEC: 40.0000 mg | DELAYED_RELEASE_TABLET | Freq: Every day | ORAL | 1 refills | Status: DC

## 2023-12-10 MED ORDER — ASPIRIN 325 MG PO TABS: ORAL_TABLET | ORAL | 0 refills | Status: AC

## 2023-12-10 MED ORDER — AMOXICILLIN-POT CLAVULANATE 875-125 MG PO TABS: 1.0000 | ORAL_TABLET | Freq: Two times a day (BID) | ORAL | 0 refills | Status: DC

## 2023-12-10 NOTE — Progress Notes (Signed)
D/c tele and IV. Went over AVS with pt and all questions were addressed.   Lavenia Atlas, RN

## 2023-12-10 NOTE — Telephone Encounter (Signed)
 Pt discharged from [facility] today 6/30.  Triage nursing to place a TOC call to the pt tomorrow 7/1.

## 2023-12-10 NOTE — TOC Initial Note (Signed)
 Transition of Care [facility]) - Initial/Assessment Note    Patient Details  Name: Samantha [name] MRN: [id number] Date of Birth: Feb 09, 1946  [facility]) CM/SW Contact:    Sudie Erminio Deems, RN Phone Number: 12/10/2023, 10:57 AM  Clinical Narrative:  Patient presented for chest pain and CT chest showed pneumonia. PTA patient was from home with family support. Patient has PCP and gets to appointments without any issues. Patient has DME cane and rolling walker. No home needs identified during the visit. Patient states she will call family for transportation home and if they cannot transport she will use a cab in the discharge lounge. No further needs identified.                 Expected Discharge Plan: Home/Self Care Barriers to Discharge: No Barriers Identified   Patient Goals and CMS Choice Patient states their goals for this hospitalization and ongoing recovery are:: plan to return home          Expected Discharge Plan and Services In-house Referral: NA Discharge Planning Services: CM Consult Post Acute Care Choice: NA Living arrangements for the past 2 months: Single Family Home Expected Discharge Date: 12/10/23                 DME Agency: NA       HH Arranged: NA          Prior Living Arrangements/Services Living arrangements for the past 2 months: Single Family Home Lives with:: Self (has support of family) Patient language and need for interpreter reviewed:: Yes Do you feel safe going back to the place where you live?: Yes      Need for Family Participation in Patient Care: No (Comment) Care giver support system in place?: No (comment) Current home services: DME (cane and rolling walker) Criminal Activity/Legal Involvement Pertinent to Current Situation/Hospitalization: No - Comment as needed  Activities of Daily Living   ADL Screening (condition at time of admission) Independently performs ADLs?: Yes (appropriate for developmental age) Is the  patient deaf or have difficulty hearing?: No Does the patient have difficulty seeing, even when wearing glasses/contacts?: No Does the patient have difficulty concentrating, remembering, or making decisions?: No  Permission Sought/Granted Permission sought to share information with : Family Supports, Case Manager                Emotional Assessment Appearance:: Appears stated age Attitude/Demeanor/Rapport: Engaged Affect (typically observed): Appropriate Orientation: : Oriented to Self, Oriented to Place, Oriented to  Time, Oriented to Situation Alcohol / Substance Use: Not Applicable Psych Involvement: No (comment)  Admission diagnosis:  Pneumonia [J18.9] Fever, unspecified fever cause [R50.9] Chest pain, unspecified type [R07.9] Patient Active Problem List   Diagnosis Date Noted   CKD (chronic kidney disease) stage 3, GFR 30-59 ml/min (HCC) 12/08/2023   Acute idiopathic pericarditis 12/08/2023   Pericardial effusion 12/08/2023   Pneumonia 12/07/2023   Right wrist pain 03/10/2023   Increased anion gap metabolic acidosis 03/10/2023   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   Septic joint of right wrist (HCC) 03/07/2023   SVT (supraventricular tachycardia) (HCC) 01/26/2023   Paroxysmal atrial fibrillation (HCC) 01/25/2023   Malnutrition of moderate degree 01/24/2023   Sepsis secondary to UTI (HCC) 01/23/2023   Vaginal dryness 11/16/2022   Normal breast exam 11/16/2022   Vaginal discharge 11/16/2022   Vulvar itching 11/16/2022   Screening examination for STD (sexually transmitted disease) 11/16/2022   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   Type 2  diabetes mellitus without complication, without long-term current use of insulin  (HCC) 10/06/2022   Hyperthyroidism 10/05/2022   Pain in joint of right knee 06/20/2022   KNEE, ARTHRITIS, DEGEN./OSTEO 12/16/2007   DERANGEMENT MENISCUS 12/16/2007   HLD (hyperlipidemia) 12/18/2006   HYPERLIPIDEMIA 05/07/2006   OBESITY  NOS 05/07/2006   ANEMIA-NOS 05/07/2006   CARPAL TUNNEL SYNDROME 05/07/2006   Essential hypertension, benign 05/07/2006   PCP:  Campbell Reynolds, NP Pharmacy:   [facility] [phone] - [place], [place] - [address] AT [facility] [address] [place] [address] Phone: [phone] Fax: [phone]     Social Drivers of Health (SDOH) Social History: SDOH Screenings   Food Insecurity: No Food Insecurity (12/08/2023)  Housing: Low Risk  (12/08/2023)  Transportation Needs: No Transportation Needs (12/08/2023)  Utilities: Not At Risk (12/08/2023)  Alcohol Screen: Low Risk  (11/16/2022)  Depression (PHQ2-9): Medium Risk (11/16/2022)  Financial Resource Strain: Low Risk  (11/16/2022)  Physical Activity: Inactive (11/16/2022)  Social Connections: Moderately Integrated (12/08/2023)  Stress: No Stress Concern Present (11/16/2022)  Tobacco Use: Low Risk  (12/07/2023)   SDOH Interventions:     Readmission Risk Interventions     No data to display

## 2023-12-10 NOTE — Progress Notes (Signed)
 Cardiology Progress Note  Patient ID: Samantha [name] MRN: [id number] DOB: [birth year] Date of Encounter: 12/10/2023 Primary Cardiologist: Maude Emmer, MD  Subjective   Chief Complaint: Chest pain  HPI: Still having chest discomfort.  Reports it is slightly improved.  Low energy reported as well.  ROS:  All other ROS reviewed and negative. Pertinent positives noted in the HPI.     Telemetry   No complaints chest pain improved    Physical Exam   Vitals:   12/10/23 0200 12/10/23 0245 12/10/23 0400 12/10/23 0500  BP:  113/64  138/68  Pulse: 79 76 65 67  Resp:    18  Temp:    98.8 F (37.1 C)  TempSrc:    Axillary  SpO2: 100% 95% 100% 100%  Weight:      Height:        Intake/Output Summary (Last 24 hours) at 12/10/2023 0804 Last data filed at 12/10/2023 0300 Gross per 24 hour  Intake 100 ml  Output --  Net 100 ml       12/07/2023    2:26 PM 11/22/2023    3:42 PM 11/09/2023   12:35 PM  Last 3 Weights  Weight (lbs) 145 lb 154 lb 3.2 oz 150 lb 2.1 oz  Weight (kg) 65.772 kg 69.945 kg 68.1 kg    Body mass index is 27.4 kg/m.   Affect appropriate Healthy:  appears stated age HEENT: normal Neck supple with no adenopathy JVP normal no bruits no thyromegaly Lungs clear with no wheezing and good diaphragmatic motion Heart:  S1/S2 no murmur, no rub, gallop or click PMI normal Abdomen: benighn, BS positve, no tenderness, no AAA no bruit.  No HSM or HJR Distal pulses intact with no bruits No edema Neuro non-focal Skin warm and dry No muscular weakness   Cardiac Studies  TTE 12/09/23    1. Left ventricular ejection fraction, by estimation, is 60 to 65%. The  left ventricle has normal function. The left ventricle has no regional  wall motion abnormalities. There is mild left ventricular hypertrophy.  Left ventricular diastolic parameters  were normal. There is the interventricular septum is flattened in systole  and diastole, consistent with right ventricular pressure  and volume  overload.   2. Right ventricular systolic function is normal. The right ventricular  size is normal. There is moderately elevated pulmonary artery systolic  pressure. The estimated right ventricular systolic pressure is 48.6 mmHg.   3. The mitral valve is normal in structure. Mild mitral valve  regurgitation. No evidence of mitral stenosis.   4. The tricuspid valve is abnormal. Tricuspid valve regurgitation is  moderate to severe.   5. The aortic valve is tricuspid. Aortic valve regurgitation is not  visualized. No aortic stenosis is present.   6. The inferior vena cava is normal in size with greater than 50%  respiratory variability, suggesting right atrial pressure of 3 mmHg.    Patient Profile  Samantha [name] is a 78 y.o. female with paroxysmal atrial fibrillation, hypertension, diabetes, CKD stage IIIa, hyperthyroidism admitted on 12/07/2023 with pneumonia and acute pericarditis.  Cardiology consulted for pericardial effusion and acute pericarditis.  Assessment & Plan   # Acute pericarditis # Pericardial effusion without tamponade - Per Dr Burton high dose ASA no NSAI with renal dx - echo with no effusion but definitely pericardial thickening on CT - at risk for constriction in future  - I am holding her home Eliquis  in the setting of acute pericarditis.  I have  added Protonix for GI protection. - Ok for d/c from our standpoint.  - will f/u CRP and ESR in 4 weeks   # PNA - per primary team.   # Paroxysmal A-fib - Holding Eliquis  in the setting of acute pericarditis.  No recurrence of A-fib. - Continue home metoprolol .  # HLD - Home statin  # HTN - Home BP medications.  Per primary team.  # Hyperthyroidism -home methimazole   - ? Increase dose as she is still hyperthyroid - Follows with [facility] as outpatient - puts her at higher risk for recurrent PAF      For questions or updates, please contact [facility] Please consult [email or web address] for contact  info under   Maude Emmer MD [facility]

## 2023-12-10 NOTE — Telephone Encounter (Signed)
 Pt scheduled for TOC on 07/09 at 2:45pm with Rollo Louder, PA per Dr. Delford

## 2023-12-10 NOTE — Discharge Summary (Signed)
 Physician Discharge Summary   Patient: Samantha [name] MRN: [id number] DOB: 1945-07-22  Admit date:     12/07/2023  Discharge date: {dischdate:26783}  Discharge Physician: Samantha [name]   PCP: Samantha Reynolds, NP   Recommendations at discharge:  {Tip this will not be part of the note when signed- Example include specific recommendations for outpatient follow-up, pending tests to follow-up on. (Optional):26781}  ***  Discharge Diagnoses: Principal Problem:   Pneumonia Active Problems:   HLD (hyperlipidemia)   Essential hypertension, benign   Hyperthyroidism   Type 2 diabetes mellitus without complication, without long-term current use of insulin  (HCC)   Paroxysmal atrial fibrillation (HCC)   CKD (chronic kidney disease) stage 3, GFR 30-59 ml/min (HCC)   Acute idiopathic pericarditis   Pericardial effusion  Resolved Problems:   * No resolved hospital problems. [facility] Course: No notes on file  Assessment and Plan: No notes have been filed under this hospital service. Service: Hospitalist     {Tip this will not be part of the note when signed Body mass index is 27.4 kg/m. , ,  (Optional):26781}  {(NOTE) Pain control PDMP Statment (Optional):26782} Consultants: *** Procedures performed: ***  Disposition: {Plan; Disposition:26390} Diet recommendation:  Discharge Diet Orders (From admission, onward)     Start     Ordered   12/10/23 0000  Diet - low sodium heart healthy        12/10/23 1010           {Diet_Plan:26776} DISCHARGE MEDICATION: Allergies as of 12/10/2023   No Known Allergies      Medication List     STOP taking these medications    Eliquis  5 MG Tabs tablet Generic drug: apixaban        TAKE these medications    acetaminophen  500 MG tablet Commonly known as: TYLENOL  Take 500-1,000 mg by mouth every 6 (six) hours as needed for mild pain.   amoxicillin-clavulanate 875-125 MG tablet Commonly known as: AUGMENTIN Take 1 tablet by  mouth 2 (two) times daily for 5 days.   aspirin  325 MG tablet Take 2 tablets (650 mg total) by mouth 3 (three) times daily for 7 days, THEN 1 tablet (325 mg total) 3 (three) times daily for 7 days, THEN 1 tablet (325 mg total) in the morning and at bedtime for 7 days, THEN 1 tablet (325 mg total) daily for 7 days. Start taking on: December 10, 2023   colchicine 0.6 MG tablet Take 1 tablet (0.6 mg total) by mouth 2 (two) times daily for 7 days, THEN 1 tablet (0.6 mg total) daily for 14 days. Start taking on: December 10, 2023   ferrous sulfate  325 (65 FE) MG tablet Take 325 mg by mouth daily.   folic acid 1 MG tablet Commonly known as: FOLVITE Take 1 mg by mouth every morning.   hydrALAZINE  100 MG tablet Commonly known as: APRESOLINE  Take 100 mg by mouth 2 (two) times daily.   metFORMIN  500 MG 24 hr tablet Commonly known as: GLUCOPHAGE -XR Take 1 tablet (500 mg total) by mouth 2 (two) times daily with a meal.   methimazole  10 MG tablet Commonly known as: TAPAZOLE  Take 1 tablet (10 mg total) by mouth daily with breakfast.   metoprolol  tartrate 100 MG tablet Commonly known as: LOPRESSOR  Take 100 mg by mouth 2 (two) times daily.   pantoprazole 40 MG tablet Commonly known as: PROTONIX Take 1 tablet (40 mg total) by mouth daily. Start taking on: December 11, 2023   rosuvastatin  5 MG tablet Commonly known as: CRESTOR Take 5 mg by mouth daily.   spironolactone  25 MG tablet Commonly known as: ALDACTONE  Take 0.5 tablets (12.5 mg total) by mouth daily. What changed: how much to take   valsartan  80 MG tablet Commonly known as: DIOVAN  Take 80 mg by mouth daily.   valsartan  160 MG tablet Commonly known as: DIOVAN  TAKE 1 TABLET BY MOUTH EVERY DAY        Discharge Exam: Filed Weights   12/07/23 1426  Weight: 65.8 kg   ***  Condition at discharge: {DC Condition:26389}  The results of significant diagnostics from this hospitalization (including imaging, microbiology, ancillary and  laboratory) are listed below for reference.   Imaging Studies: ECHOCARDIOGRAM COMPLETE Result Date: 12/09/2023    ECHOCARDIOGRAM REPORT   Patient Name:   Samantha [name] Date of Exam: 12/09/2023 Medical Rec #:  [id number]   Height:       61.0 in Accession #:    [id number]  Weight:       145.0 lb Date of Birth:  [birth year]   BSA:          1.647 m Patient Age:    78 years    BP:           450/67 mmHg Patient Gender: F           HR:           84 bpm. Exam Location:  Inpatient Procedure: 2D Echo, Cardiac Doppler and Color Doppler (Both Spectral and Color            Flow Doppler were utilized during procedure). Indications:    I31.3 Pericardial effusion (noninflammatory)  History:        Patient has prior history of Echocardiogram examinations, most                 recent 01/24/2023. Abnormal ECG, Arrythmias:Atrial Fibrillation                 and SVT; Risk Factors:Hypertension, Diabetes and Dyslipidemia.                 Pericardial effusion.  Sonographer:    Samantha [name] RDCS Referring Phys: [id number] Samantha [name] IMPRESSIONS  1. Left ventricular ejection fraction, by estimation, is 60 to 65%. The left ventricle has normal function. The left ventricle has no regional wall motion abnormalities. There is mild left ventricular hypertrophy. Left ventricular diastolic parameters were normal. There is the interventricular septum is flattened in systole and diastole, consistent with right ventricular pressure and volume overload.  2. Right ventricular systolic function is normal. The right ventricular size is normal. There is moderately elevated pulmonary artery systolic pressure. The estimated right ventricular systolic pressure is 48.6 mmHg.  3. The mitral valve is normal in structure. Mild mitral valve regurgitation. No evidence of mitral stenosis.  4. The tricuspid valve is abnormal. Tricuspid valve regurgitation is moderate to severe.  5. The aortic valve is tricuspid. Aortic valve regurgitation is not visualized. No aortic  stenosis is present.  6. The inferior vena cava is normal in size with greater than 50% respiratory variability, suggesting right atrial pressure of 3 mmHg. FINDINGS  Left Ventricle: Left ventricular ejection fraction, by estimation, is 60 to 65%. The left ventricle has normal function. The left ventricle has no regional wall motion abnormalities. The left ventricular internal cavity size was normal in size. There is  mild left ventricular hypertrophy. The interventricular septum is flattened in systole and diastole,  consistent with right ventricular pressure and volume overload. Left ventricular diastolic parameters were normal. Right Ventricle: The right ventricular size is normal. No increase in right ventricular wall thickness. Right ventricular systolic function is normal. There is moderately elevated pulmonary artery systolic pressure. The tricuspid regurgitant velocity is 2.90 m/s, and with an assumed right atrial pressure of 15 mmHg, the estimated right ventricular systolic pressure is 48.6 mmHg. Left Atrium: Left atrial size was normal in size. Right Atrium: Right atrial size was normal in size. Pericardium: There is no evidence of pericardial effusion. Mitral Valve: The mitral valve is normal in structure. Mild mitral valve regurgitation. No evidence of mitral valve stenosis. Tricuspid Valve: The tricuspid valve is abnormal. Tricuspid valve regurgitation is moderate to severe. No evidence of tricuspid stenosis. Aortic Valve: The aortic valve is tricuspid. Aortic valve regurgitation is not visualized. No aortic stenosis is present. Aortic valve mean gradient measures 5.5 mmHg. Aortic valve peak gradient measures 10.3 mmHg. Aortic valve area, by VTI measures 1.68  cm. Pulmonic Valve: The pulmonic valve was not well visualized. Pulmonic valve regurgitation is not visualized. No evidence of pulmonic stenosis. Aorta: The aortic root and ascending aorta are structurally normal, with no evidence of dilitation.  Venous: The inferior vena cava is normal in size with greater than 50% respiratory variability, suggesting right atrial pressure of 3 mmHg. IAS/Shunts: No atrial level shunt detected by color flow Doppler.  LEFT VENTRICLE PLAX 2D LVIDd:         4.10 cm     Diastology LVIDs:         2.70 cm     LV e' medial:    7.72 cm/s LV PW:         1.20 cm     LV E/e' medial:  12.9 LV IVS:        1.10 cm     LV e' lateral:   10.90 cm/s LVOT diam:     1.70 cm     LV E/e' lateral: 9.2 LV SV:         52 LV SV Index:   32 LVOT Area:     2.27 cm  LV Volumes (MOD) LV vol d, MOD A2C: 92.7 ml LV vol d, MOD A4C: 88.8 ml LV vol s, MOD A2C: 35.2 ml LV vol s, MOD A4C: 31.5 ml LV SV MOD A2C:     57.5 ml LV SV MOD A4C:     88.8 ml LV SV MOD BP:      58.2 ml RIGHT VENTRICLE            IVC RV S prime:     7.94 cm/s  IVC diam: 1.80 cm TAPSE (M-mode): 1.5 cm LEFT ATRIUM             Index        RIGHT ATRIUM           Index LA diam:        4.00 cm 2.43 cm/m   RA Area:     14.80 cm LA Vol (A2C):   33.0 ml 20.03 ml/m  RA Volume:   40.35 ml  24.49 ml/m LA Vol (A4C):   35.1 ml 21.31 ml/m LA Biplane Vol: 35.4 ml 21.49 ml/m  AORTIC VALVE AV Area (Vmax):    1.70 cm AV Area (Vmean):   1.64 cm AV Area (VTI):     1.68 cm AV Vmax:           160.42 cm/s AV  Vmean:          111.048 cm/s AV VTI:            0.313 m AV Peak Grad:      10.3 mmHg AV Mean Grad:      5.5 mmHg LVOT Vmax:         120.00 cm/s LVOT Vmean:        80.400 cm/s LVOT VTI:          0.231 m LVOT/AV VTI ratio: 0.74  AORTA Ao Root diam: 2.15 cm Ao Asc diam:  3.55 cm MITRAL VALVE               TRICUSPID VALVE MV Area (PHT): 4.07 cm    TR Peak grad:   33.6 mmHg MV Decel Time: 187 msec    TR Vmax:        290.00 cm/s MV E velocity: 99.75 cm/s MV A velocity: 65.15 cm/s  SHUNTS MV E/A ratio:  1.53        Systemic VTI:  0.23 m                            Systemic Diam: 1.70 cm Dorn Ross MD Electronically signed by Dorn Ross MD Signature Date/Time: 12/09/2023/4:01:25 PM    Final     CT Chest W Contrast Result Date: 12/07/2023 CLINICAL DATA:  Nonspecific chest pain. EXAM: CT CHEST WITH CONTRAST TECHNIQUE: Multidetector CT imaging of the chest was performed during intravenous contrast administration. RADIATION DOSE REDUCTION: This exam was performed according to the departmental dose-optimization program which includes automated exposure control, adjustment of the mA and/or kV according to patient size and/or use of iterative reconstruction technique. CONTRAST:  75mL OMNIPAQUE  IOHEXOL  350 MG/ML SOLN COMPARISON:  Same day CT abdomen pelvis and same day chest radiograph; CT chest 01/19/2023 FINDINGS: Cardiovascular: Small pericardial effusion with pericardial thickening and hyperenhancement compatible with pericarditis. Normal caliber thoracic aorta. No dissection. Mild aortic atherosclerotic calcification. Mediastinum/Nodes: Enlarged multinodular right thyroid  lobe. This was previously evaluated with ultrasound 10/20/2022. Trachea and esophagus are unremarkable. Trace stranding and free fluid in the anterior mediastinum. Lungs/Pleura: Small left greater than right pleural effusions. Airspace opacities in the lower lobes with central hypoattenuation suggesting pneumonia. No pneumothorax. Upper Abdomen: No acute abnormality. Musculoskeletal: No acute fracture. IMPRESSION: 1. Small pericardial effusion with pericardial thickening and hyperenhancement compatible with pericarditis. 2. Small left greater than right pleural effusions. Airspace opacities in the lower lobes with central hypoattenuation suggesting pneumonia. 3. Aortic Atherosclerosis (ICD10-I70.0). Electronically Signed   By: Norman Gatlin M.D.   On: 12/07/2023 22:41   CT ABDOMEN PELVIS W CONTRAST Result Date: 12/07/2023 CLINICAL DATA:  Chest pain, sepsis EXAM: CT ABDOMEN AND PELVIS WITH CONTRAST TECHNIQUE: Multidetector CT imaging of the abdomen and pelvis was performed using the standard protocol following bolus administration  of intravenous contrast. RADIATION DOSE REDUCTION: This exam was performed according to the departmental dose-optimization program which includes automated exposure control, adjustment of the mA and/or kV according to patient size and/or use of iterative reconstruction technique. CONTRAST:  75mL OMNIPAQUE  IOHEXOL  350 MG/ML SOLN COMPARISON:  CT abdomen pelvis 01/19/2023 FINDINGS: Lower chest: Small bilateral pleural effusions. Associated atelectasis or pneumonia. Small pericardial effusion. Question pericardial thickening and hyperenhancement. Hepatobiliary: No acute abnormality. Pancreas: Unremarkable. Spleen: Unremarkable. Adrenals/Urinary Tract: Unremarkable adrenal glands and kidneys. No urinary calculi or hydronephrosis. Unremarkable bladder. Stomach/Bowel: No bowel obstruction or bowel wall thickening. Colonic diverticulosis without diverticulitis. Stomach and appendix  are within normal limits. Vascular/Lymphatic: Mild aortic atherosclerotic calcification. No lymphadenopathy. Reproductive: Endometrial thickening measuring up to 7 mm. Bilateral adnexa are unremarkable. Other: No free intraperitoneal fluid or air. Musculoskeletal: No acute fracture. IMPRESSION: 1. Small pericardial effusion with possible thickening and hyperenhancement of the pericardium. Recommend CT chest with IV contrast for further evaluation for pericarditis. 2. Small bilateral pleural effusions. Associated atelectasis or pneumonia. 3. Endometrial thickening measuring 7 mm. Recommend nonemergent pelvic ultrasound for further evaluation. 4. No acute abnormality in the abdomen or pelvis. 5. Aortic Atherosclerosis (ICD10-I70.0). Electronically Signed   By: Norman Gatlin M.D.   On: 12/07/2023 20:34   DG Chest Portable 1 View Result Date: 12/07/2023 CLINICAL DATA:  pna? EXAM: PORTABLE CHEST - 1 VIEW COMPARISON:  Nov 09, 2023 FINDINGS: No focal airspace consolidation, pleural effusion, or pneumothorax. Mild cardiomegaly. Aortic  atherosclerosis. No acute fracture or destructive lesions. Multilevel thoracic osteophytosis. IMPRESSION: No acute cardiopulmonary abnormality. Electronically Signed   By: Rogelia Myers M.D.   On: 12/07/2023 15:42    Microbiology: Results for orders placed or performed during the hospital encounter of 12/07/23  Resp panel by RT-PCR (RSV, Flu A&B, Covid) Urine, Clean Catch     Status: None   Collection Time: 12/07/23  3:11 PM   Specimen: Urine, Clean Catch; Nasal Swab  Result Value Ref Range Status   SARS Coronavirus 2 by RT PCR NEGATIVE NEGATIVE Final   Influenza A by PCR NEGATIVE NEGATIVE Final   Influenza B by PCR NEGATIVE NEGATIVE Final    Comment: (NOTE) The Xpert Xpress SARS-CoV-2/FLU/RSV plus assay is intended as an aid in the diagnosis of influenza from Nasopharyngeal swab specimens and should not be used as a sole basis for treatment. Nasal washings and aspirates are unacceptable for Xpert Xpress SARS-CoV-2/FLU/RSV testing.  Fact Sheet for Patients: BloggerCourse.com  Fact Sheet for Healthcare Providers: SeriousBroker.it  This test is not yet approved or cleared by the [place]  FDA and has been authorized for detection and/or diagnosis of SARS-CoV-2 by FDA under an Emergency Use Authorization (EUA). This EUA will remain in effect (meaning this test can be used) for the duration of the COVID-19 declaration under Section 564(b)(1) of the Act, 21 U.S.C. section 360bbb-3(b)(1), unless the authorization is terminated or revoked.     Resp Syncytial Virus by PCR NEGATIVE NEGATIVE Final    Comment: (NOTE) Fact Sheet for Patients: BloggerCourse.com  Fact Sheet for Healthcare Providers: SeriousBroker.it  This test is not yet approved or cleared by the [place]  FDA and has been authorized for detection and/or diagnosis of SARS-CoV-2 by FDA under an Emergency Use  Authorization (EUA). This EUA will remain in effect (meaning this test can be used) for the duration of the COVID-19 declaration under Section 564(b)(1) of the Act, 21 U.S.C. section 360bbb-3(b)(1), unless the authorization is terminated or revoked.  Performed at [facility], [address], [place], [place] [address]     Labs: CBC: Recent Labs  Lab 12/07/23 1511 12/08/23 0214 12/09/23 1245  WBC 9.3 11.4* 9.5  NEUTROABS 7.9*  --   --   HGB 10.5* 9.2* 9.6*  HCT 35.6* 30.5* 31.3*  MCV 79.8* 77.2* 77.9*  PLT 388 373 437*   Basic Metabolic Panel: Recent Labs  Lab 12/07/23 1511 12/08/23 0214 12/09/23 1245  NA 139  --  136  K 3.9  --  3.6  CL 108  --  108  CO2 17*  --  17*  GLUCOSE 165*  --  222*  BUN 11  --  15  CREATININE 1.19* 0.99 1.29*  CALCIUM 9.3  --  8.7*   Liver Function Tests: Recent Labs  Lab 12/07/23 1511  AST 12*  ALT 9  ALKPHOS 89  BILITOT 1.2  PROT 7.4  ALBUMIN 3.0*   CBG: Recent Labs  Lab 12/09/23 1309 12/09/23 1656 12/09/23 2022 12/09/23 2237 12/10/23 0802  GLUCAP 203* 163* 145* 141* 147*    Discharge time spent: {LESS THAN/GREATER UYJW:73611} 30 minutes.  Signed: Concepcion Riser, MD [facility] 12/10/2023

## 2023-12-11 NOTE — Telephone Encounter (Signed)
 Left message to call back

## 2023-12-12 NOTE — Progress Notes (Signed)
 Cardiology Office Note   Date:  12/19/2023  ID:  Samantha, [name] 06-02-1946, MRN [id number] PCP: Campbell Reynolds, NP  [facility] Providers Cardiologist:  Maude Emmer, MD  History of Present Illness Samantha [name] is a 78 y.o. female with a past medical history of palpitations/SVT, paroxysmal atrial fibrillation, HTN, HLD, type 2 DM, CKD stage IIIb, hyperthyroidism s/p iodine ablation in 11/2022, chronic back pain. Patient is followed by Dr. Emmer and presents today for a hospital follow up appointment  Patient previously had a nuclear stress test in 05/2020 that was a low risk study without evidence of ischemia or infarction. 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 She was referred to endocrinology and underwent neuroactive iodine thyroid  ablation in 11/2022. She was later admitted from 8/13--02/01/23 after she presented for evaluation of altered mental status and weakness. Upon arrival to the ED, she was tachycardic and BP was intermittently soft. EKG showed sinus tachycardia. She was admitted for acute metabolic encephalopathy due to AKI and hypernatremia. She was treated with IV fluids. While admitted, patient developed atrial fibrillation with RVR. She was started on IV diltiazem  drip and was given IV lopressor . Cardiology was consulted. Found to have TSH low at 0.028 and free T4 elevated to 17.5. Echocardiogram on 01/24/23 showed EF >75%, no regional wall motion abnormalities, mild LVH, grade I DD, normal RV function. There was elevated LVOT peak velocity of 4 m/s, likely due to hyperdynamic LV function and chordal SAM. Patient converted to NSR and was discharged on eliquis  5 mg BID, metoprolol  tartrate 50 mg BID.    Patient was last seen by Dr. Emmer on 04/23/23. At that time, her BP was well controlled. She remained in NSR on lopressor  and eliquis . Of note, she was not completed compliant with methimazole  or beta blocker. Instructed to be  compliant with her medications and follow up with endocrinology. I saw her in clinic 07/06/23. At that time, her BP remained elevated. Meds were adjusted. When seen on 08/14/23, her BP had improved but was still slightly elevated. Valsartan  increased    She was seen in the ED on 5/30 with right sided chest pain. Worse with inspiration, sneezing ,or movement. Chest wall was tender to palpation. D-dimer negative. hsTn negative x2. Diagnosed with musculoskeltal chest pain, discharged from the ED. has already follow-up/12/25.  Time, patient cannot have occasional episodes of right-sided chest pain.  Pain was localized to 1 spot and it was reproducible patient.  Denies shortness of breath.  It was felt that chest pain was musculoskeletal in nature.  She was recently admitted 6/27 - 6/30 with pneumonia and presumed pericarditis.  Presented complaining of loss of appetite, fatigue, chest pain, mild cough.  In the ER, chest CT showed small left> right pleural effusion, small pericardial effusion.  High-sensitivity troponin negative x 2.  EKG showed sinus tachycardia with heart rate 105 bpm, nonspecific ST/T wave changes.  ESR and CRP were elevated.  She was started on aspirin  650 mg 3 times daily for 7 days followed by 325 mg 3 times daily for 7 days.  Also started on colchicine .  Her anticoagulation was held while she was on high-dose aspirin .  Echocardiogram 12/09/2023 showed EF 60-65%, no regional wall motion abnormalities, mild LVH, normal RV function, moderately elevated PA systolic pressure, mild MR moderate-severe TR, no evidence of pericardial effusion   Patient reports that she has been doing very well since getting discharged from the hospital.  She denies  having chest discomfort or shortness of breath since getting discharged.  Her blood pressure has been well-controlled.  She has not been taking valsartan .  Her primary care provider checked her thyroid  and kidney labs yesterday.  She has been referred to a  nephrologist for further management of her CKD.  She denies palpitations.  Denies lower extremity swelling   Studies Reviewed  Cardiac Studies & Procedures   ______________________________________________________________________________________________   STRESS TESTS  MYOCARDIAL PERFUSION IMAGING 05/27/2020  Interpretation Summary  Nuclear stress EF: 74%. The left ventricular ejection fraction is hyperdynamic (>65%).  The patient walked on a standard Bruce protocol treadmill test for 4 minutes.  She achieved a peak heart rate of 162 which is 110% predicted maximal heart rate.  At peak exercise there were no ST or T wave changes to suggest ischemia. There was no QRS widening at peak exercise.  This is a low risk study. The patient has fair exercise capacity. There is no evidence of infarction or ischemia.   ECHOCARDIOGRAM  ECHOCARDIOGRAM COMPLETE 12/09/2023  Narrative ECHOCARDIOGRAM REPORT    Patient Name:   Samantha [name] Date of Exam: 12/09/2023 Medical Rec #:  [id number]   Height:       61.0 in Accession #:    [id number]  Weight:       145.0 lb Date of Birth:  December 06, 1945   BSA:          1.647 m Patient Age:    78 years    BP:           450/67 mmHg Patient Gender: F           HR:           84 bpm. Exam Location:  Inpatient  Procedure: 2D Echo, Cardiac Doppler and Color Doppler (Both Spectral and Color Flow Doppler were utilized during procedure).  Indications:    I31.3 Pericardial effusion (noninflammatory)  History:        Patient has prior history of Echocardiogram examinations, most recent 01/24/2023. Abnormal ECG, Arrythmias:Atrial Fibrillation and SVT; Risk Factors:Hypertension, Diabetes and Dyslipidemia. Pericardial effusion.  Sonographer:    Ellouise Mose RDCS Referring Phys: [id number] GRAYCE BOLD  IMPRESSIONS   1. Left ventricular ejection fraction, by estimation, is 60 to 65%. The left ventricle has normal function. The left ventricle has no regional wall  motion abnormalities. There is mild left ventricular hypertrophy. Left ventricular diastolic parameters were normal. There is the interventricular septum is flattened in systole and diastole, consistent with right ventricular pressure and volume overload. 2. Right ventricular systolic function is normal. The right ventricular size is normal. There is moderately elevated pulmonary artery systolic pressure. The estimated right ventricular systolic pressure is 48.6 mmHg. 3. The mitral valve is normal in structure. Mild mitral valve regurgitation. No evidence of mitral stenosis. 4. The tricuspid valve is abnormal. Tricuspid valve regurgitation is moderate to severe. 5. The aortic valve is tricuspid. Aortic valve regurgitation is not visualized. No aortic stenosis is present. 6. The inferior vena cava is normal in size with greater than 50% respiratory variability, suggesting right atrial pressure of 3 mmHg.  FINDINGS Left Ventricle: Left ventricular ejection fraction, by estimation, is 60 to 65%. The left ventricle has normal function. The left ventricle has no regional wall motion abnormalities. The left ventricular internal cavity size was normal in size. There is mild left ventricular hypertrophy. The interventricular septum is flattened in systole and diastole, consistent with right ventricular pressure and volume overload. Left ventricular diastolic parameters  were normal.  Right Ventricle: The right ventricular size is normal. No increase in right ventricular wall thickness. Right ventricular systolic function is normal. There is moderately elevated pulmonary artery systolic pressure. The tricuspid regurgitant velocity is 2.90 m/s, and with an assumed right atrial pressure of 15 mmHg, the estimated right ventricular systolic pressure is 48.6 mmHg.  Left Atrium: Left atrial size was normal in size.  Right Atrium: Right atrial size was normal in size.  Pericardium: There is no evidence of  pericardial effusion.  Mitral Valve: The mitral valve is normal in structure. Mild mitral valve regurgitation. No evidence of mitral valve stenosis.  Tricuspid Valve: The tricuspid valve is abnormal. Tricuspid valve regurgitation is moderate to severe. No evidence of tricuspid stenosis.  Aortic Valve: The aortic valve is tricuspid. Aortic valve regurgitation is not visualized. No aortic stenosis is present. Aortic valve mean gradient measures 5.5 mmHg. Aortic valve peak gradient measures 10.3 mmHg. Aortic valve area, by VTI measures 1.68 cm.  Pulmonic Valve: The pulmonic valve was not well visualized. Pulmonic valve regurgitation is not visualized. No evidence of pulmonic stenosis.  Aorta: The aortic root and ascending aorta are structurally normal, with no evidence of dilitation.  Venous: The inferior vena cava is normal in size with greater than 50% respiratory variability, suggesting right atrial pressure of 3 mmHg.  IAS/Shunts: No atrial level shunt detected by color flow Doppler.   LEFT VENTRICLE PLAX 2D LVIDd:         4.10 cm     Diastology LVIDs:         2.70 cm     LV e' medial:    7.72 cm/s LV PW:         1.20 cm     LV E/e' medial:  12.9 LV IVS:        1.10 cm     LV e' lateral:   10.90 cm/s LVOT diam:     1.70 cm     LV E/e' lateral: 9.2 LV SV:         52 LV SV Index:   32 LVOT Area:     2.27 cm  LV Volumes (MOD) LV vol d, MOD A2C: 92.7 ml LV vol d, MOD A4C: 88.8 ml LV vol s, MOD A2C: 35.2 ml LV vol s, MOD A4C: 31.5 ml LV SV MOD A2C:     57.5 ml LV SV MOD A4C:     88.8 ml LV SV MOD BP:      58.2 ml  RIGHT VENTRICLE            IVC RV S prime:     7.94 cm/s  IVC diam: 1.80 cm TAPSE (M-mode): 1.5 cm  LEFT ATRIUM             Index        RIGHT ATRIUM           Index LA diam:        4.00 cm 2.43 cm/m   RA Area:     14.80 cm LA Vol (A2C):   33.0 ml 20.03 ml/m  RA Volume:   40.35 ml  24.49 ml/m LA Vol (A4C):   35.1 ml 21.31 ml/m LA Biplane Vol: 35.4 ml 21.49  ml/m AORTIC VALVE AV Area (Vmax):    1.70 cm AV Area (Vmean):   1.64 cm AV Area (VTI):     1.68 cm AV Vmax:           160.42 cm/s  AV Vmean:          111.048 cm/s AV VTI:            0.313 m AV Peak Grad:      10.3 mmHg AV Mean Grad:      5.5 mmHg LVOT Vmax:         120.00 cm/s LVOT Vmean:        80.400 cm/s LVOT VTI:          0.231 m LVOT/AV VTI ratio: 0.74  AORTA Ao Root diam: 2.15 cm Ao Asc diam:  3.55 cm  MITRAL VALVE               TRICUSPID VALVE MV Area (PHT): 4.07 cm    TR Peak grad:   33.6 mmHg MV Decel Time: 187 msec    TR Vmax:        290.00 cm/s MV E velocity: 99.75 cm/s MV A velocity: 65.15 cm/s  SHUNTS MV E/A ratio:  1.53        Systemic VTI:  0.23 m Systemic Diam: 1.70 cm  Dorn Ross MD Electronically signed by Dorn Ross MD Signature Date/Time: 12/09/2023/4:01:25 PM    Final          ______________________________________________________________________________________________       Risk Assessment/Calculations  CHA2DS2-VASc Score = 5  This indicates a 7.2% annual risk of stroke. The patient's score is based upon: CHF History: 0 HTN History: 1 Diabetes History: 1 Stroke History: 0 Vascular Disease History: 0 Age Score: 2 Gender Score: 1      Physical Exam VS:  BP 118/80   Pulse 65   Ht 5' 1 (1.549 m)   Wt 152 lb 3.2 oz (69 kg)   SpO2 97%   BMI 28.76 kg/m        Wt Readings from Last 3 Encounters:  12/19/23 152 lb 3.2 oz (69 kg)  12/07/23 145 lb (65.8 kg)  11/22/23 154 lb 3.2 oz (69.9 kg)    GEN: Well nourished, well developed in no acute distress. Sitting comfortably on the exam table  NECK: No JVD CARDIAC: RRR, no murmurs, rubs, gallops. Radial pulses 2+ bilaterally  RESPIRATORY:  Clear to auscultation without rales, wheezing or rhonchi. Normal WOB on room air   ABDOMEN: Soft, non-tender, non-distended EXTREMITIES:  No edema in BLE; No deformity   ASSESSMENT AND PLAN  Pericarditis  - Patient recently  admitted pneumonia and chest pain. ESR elevated to 72, CRP elevated to 29. CT chest showed pericardial thickening. Echocardiogram showed no pericardial effusion. Treated empirically with high dose ASA and colchicine  (no NSAIDs with renal disease)  - Ordered repeat ESR, CRP - of note, patient has had elevated ESR and CRP since 02/2023. Ordered ANA to evaluate for possible autoimmune disorder  - Continue ASA 650 mg 3 times daily for 7 days followed by 325 mg 3 times daily for 7 days, 325 mg BID for 7 days, and 325 mg daily for 7 days. Patient reports that this was clearly outlined on her hospital discharge paperwork so she understands the taper and when to decrease dose  - Eliquis  has been held while patient on high dose ASA to reduce bleeding risk. Restart eliquis  5 mg BID once ASA course complete  - Continue colchicine  0.6 mg BID for 3 months  - PCP ordered BMP yesterday    PAF  - Patient first diagnosed with PAF in 01/2023, had recurrence in 02/2023. Note, episodes occurred in the setting of uncontrolled hyperthyroidism  -  Patient denies palpitations, fluttering/racing heart. HR regular on exam today  - Eliquis  held while patient is on high dose ASA -  resume eliquis  5 mg BID once done with ASA  - continue metoprolol  tartrate 100 mg BID    Mild MR  Moderate-severe Tricuspid valve regurgitation  - Noted on echocardiogram 11/2023  - Euvolemic on exam today. Continue spironolactone  12.5 mg daily  - Anticipate repeat echo in 2 years for monitoring, or as symptoms arise   HTN  - BP well controlled. No symptoms of orthostatic hypotension  - She has not been taking valsartan  because she was instructed to hold it at DC. BP has been well controlled. OK to continue to hold valsartan  for now, can resume if BP becomes elevated  - Continue hydralazine  100 mg BID,  spironolactone  12.5 mg daily, metoprolol  tartrate 100 mg BID   Hyperthyroidism  - Followed by endocrinology. Previously had neuroactive iodine  thyroid  ablation in 11/2022. Considering repeat RAI treatment  - TSH 0.017 and Free T4 2.72 on 6/17  - On methimazole  - needs to follow up with endocrinology for possible med adjustment. Reports that her PCP drew thyroid  labs yesterday and is monitoring     CKD stage IIIb - Followed by PCP and recently referred to nephrology  - Creatine 1.29 on 6/29  - PCP ordered BMP yesterday    Dispo: Follow up in 3 months with APP   Signed, Rollo FABIENE Louder, PA-C

## 2023-12-13 NOTE — Telephone Encounter (Signed)
 Transition Care Management Unsuccessful Follow-up Telephone Call  Date of discharge and from where:  [facility] on 6/30.  Attempts:  2nd Attempt  Reason for unsuccessful TCM follow-up call:  Left voice message

## 2023-12-17 NOTE — Telephone Encounter (Signed)
 Patient contacted regarding discharge from [facility] on 12/10/23.  Patient understands to follow up with provider Rollo Louder PA-C on 12/19/23 at 2:45 pm at [facility] location. Patient understands discharge instructions? yes Patient understands medications and regiment? yes Patient understands to bring all medications to this visit? yes

## 2023-12-19 ENCOUNTER — Encounter: Payer: Self-pay | Admitting: Cardiology

## 2023-12-19 ENCOUNTER — Ambulatory Visit: Attending: Cardiology | Admitting: Cardiology

## 2023-12-19 VITALS — BP 118/80 | HR 65 | Ht 61.0 in | Wt 152.2 lb

## 2023-12-19 DIAGNOSIS — N1832 Chronic kidney disease, stage 3b: Secondary | ICD-10-CM

## 2023-12-19 DIAGNOSIS — I309 Acute pericarditis, unspecified: Secondary | ICD-10-CM

## 2023-12-19 DIAGNOSIS — I10 Essential (primary) hypertension: Secondary | ICD-10-CM

## 2023-12-19 DIAGNOSIS — E0590 Thyrotoxicosis, unspecified without thyrotoxic crisis or storm: Secondary | ICD-10-CM

## 2023-12-19 DIAGNOSIS — I480 Paroxysmal atrial fibrillation: Secondary | ICD-10-CM

## 2023-12-19 DIAGNOSIS — R002 Palpitations: Secondary | ICD-10-CM

## 2023-12-19 MED ORDER — APIXABAN 5 MG PO TABS: 5.0000 mg | ORAL_TABLET | Freq: Two times a day (BID) | ORAL | 3 refills | Status: DC

## 2023-12-19 MED ORDER — COLCHICINE 0.6 MG PO TABS: 0.6000 mg | ORAL_TABLET | Freq: Every day | ORAL | 0 refills | Status: DC

## 2023-12-19 NOTE — Patient Instructions (Signed)
 Medication Instructions:  Take Colchicine  0.6 mg once a day for the next 3 months Restart Eliquis  once you finish aspirin  *If you need a refill on your cardiac medications before your next appointment, please call your pharmacy*  Lab Work: Today we are going to draw a CRP, ESR, ANA w/ Spek If you have labs (blood work) drawn today and your tests are completely normal, you will receive your results only by: MyChart Message (if you have MyChart) OR A paper copy in the mail If you have any lab test that is abnormal or we need to change your treatment, we will call you to review the results.  Testing/Procedures: No testing  Follow-Up: At [facility], you and your health needs are our priority.  As part of our continuing mission to provide you with exceptional heart care, our providers are all part of one team.  This team includes your primary Cardiologist (physician) and Advanced Practice Providers or APPs (Physician Assistants and Nurse Practitioners) who all work together to provide you with the care you need, when you need it.  Your next appointment:   3 month(s)  Provider:   Rollo Louder, PA-C  We recommend signing up for the patient portal called MyChart.  Sign up information is provided on this After Visit Summary.  MyChart is used to connect with patients for Virtual Visits (Telemedicine).  Patients are able to view lab/test results, encounter notes, upcoming appointments, etc.  Non-urgent messages can be sent to your provider as well.   To learn more about what you can do with MyChart, go to [email or web address].

## 2023-12-20 ENCOUNTER — Ambulatory Visit: Payer: Self-pay | Admitting: Cardiology

## 2023-12-20 DIAGNOSIS — J189 Pneumonia, unspecified organism: Secondary | ICD-10-CM

## 2023-12-20 DIAGNOSIS — R700 Elevated erythrocyte sedimentation rate: Secondary | ICD-10-CM

## 2023-12-20 DIAGNOSIS — R7982 Elevated C-reactive protein (CRP): Secondary | ICD-10-CM

## 2023-12-20 LAB — ANA,IFA RA DIAG PNL W/RFLX TIT/PATN
ANA Titer 1: NEGATIVE
Cyclic Citrullin Peptide Ab: 4 U (ref 0–19)
Rheumatoid fact SerPl-aCnc: <10 [IU]/mL (ref <14.0)

## 2023-12-20 LAB — C-REACTIVE PROTEIN: CRP: 58 mg/L — ABNORMAL HIGH (ref 0–10)

## 2023-12-20 LAB — SEDIMENTATION RATE: Sed Rate: 85 mm/h — ABNORMAL HIGH (ref 0–40)

## 2023-12-21 MED ORDER — PREDNISONE 5 MG PO TABS: ORAL_TABLET | ORAL | 0 refills | Status: AC

## 2023-12-21 NOTE — Telephone Encounter (Addendum)
 Patient's ESR and CRP returned elevated- ESR increased from 72>85 and CRP increased from 29>58. This is after patient was compliant with high dose aspirin  and colchicine . When I had seen patient in clinic on 7/9, she was doing well and denied chest pain.  Discussed with Dr. Nishan- She needs f/u non contrast CT chest for her pneumonia and f/u endocrine for her hyperthyroidism would continue ASA/colchicine  Start slow prednisone  taper. 30 mg for a week and taper 5 mg /week until she is at 5 mg. Check ESR/CRP again then. Find out how much Arcalyst would cost her in case slow prednisone  taper with continued ASA/colchicine  don't help Avoiding NSAI's due to renal failure    I called patient to discuss lab findings and Dr. Claiborne recommendations. Patient in agreement with plan. I also recommended referral to rheumatology, which patient is in agreement with.   I have sent a message to my covering CMA asking that non-contrast chest CT be ordered and arranged, and rheumatology referral be placed.   I sent prednisone  taper to her pharmacy. She already has follow up with endocrinology   Rollo FABIENE Louder, PA-C 12/21/2023 11:40 AM

## 2023-12-26 NOTE — Addendum Note (Signed)
 Addended by: BYRON LEONTINE RAMAN on: 12/26/2023 05:51 PM   Modules accepted: Orders

## 2023-12-26 NOTE — Telephone Encounter (Signed)
-----   Message from Rollo JONELLE Louder, PA-C sent at 12/21/2023 11:41 AM EDT -----  Leontine - please order non-contrast Chest CT for pneumonia and help arrange. Please also place referral to rheumatology for persistently elevated ESR and CRP

## 2024-01-01 ENCOUNTER — Telehealth: Payer: Self-pay | Admitting: Cardiovascular Disease

## 2024-01-01 MED ORDER — SPIRONOLACTONE 25 MG PO TABS: 25.0000 mg | ORAL_TABLET | Freq: Every day | ORAL | 3 refills | Status: DC

## 2024-01-01 NOTE — Telephone Encounter (Signed)
 Called patient with Rollo Louder PA advisement.   Louder Rollo SAUNDERS, PA-C to Me   01/01/24 11:38 AM OK to take 25 mg daily. Her blood pressure should tolerate this fine, but she should let us  know if she develops any dizziness, lightheadedness, etc    Thanks KJ   Placed order for aldactone  25 mg by mouth daily to patient's pharmacy of choice. Patient agreed to call if she has any issue with new dose.

## 2024-01-01 NOTE — Telephone Encounter (Signed)
  Per answering service message:  Patient takes spironolactone  (ALDACTONE ) 25 MG tablet  and she cuts it in half to take 1/2 daily. Pill is so small and hard to cut. She is asking if there is a different dosage that she can take.

## 2024-01-01 NOTE — Telephone Encounter (Signed)
 Called patient back about her question. Patient stated that she needs to either take a whole pill or switch to something else due to not being able to cut pill. Patient stated the pill is too small to cut evenly. Patient has no complaints of taking aldactone . Will see if provider wants to prescribe patient the whole 25 mg daily or take 25 mg every other day. Will forward to provider for advisement.

## 2024-01-16 LAB — LIPID PANEL
Chol/HDL Ratio: 2.2 ratio (ref 0.0–4.4)
Cholesterol, Total: 283 mg/dL — ABNORMAL HIGH (ref 100–199)
HDL: 128 mg/dL (ref >39)
LDL Chol Calc (NIH): 144 mg/dL — ABNORMAL HIGH (ref 0–99)
Triglycerides: 74 mg/dL (ref 0–149)
VLDL Cholesterol Cal: 11 mg/dL (ref 5–40)

## 2024-01-16 LAB — TSH: TSH: 0.036 u[IU]/mL — ABNORMAL LOW (ref 0.450–4.500)

## 2024-01-16 LAB — T4, FREE: Free T4: 1.9 ng/dL — ABNORMAL HIGH (ref 0.82–1.77)

## 2024-01-16 LAB — T3, FREE: T3, Free: 2.5 pg/mL (ref 2.0–4.4)

## 2024-01-17 ENCOUNTER — Ambulatory Visit: Admitting: "Endocrinology

## 2024-01-17 ENCOUNTER — Encounter: Payer: Self-pay | Admitting: "Endocrinology

## 2024-01-17 VITALS — BP 144/90 | HR 56 | Ht 61.0 in | Wt 147.0 lb

## 2024-01-17 DIAGNOSIS — E782 Mixed hyperlipidemia: Secondary | ICD-10-CM

## 2024-01-17 DIAGNOSIS — E0590 Thyrotoxicosis, unspecified without thyrotoxic crisis or storm: Secondary | ICD-10-CM

## 2024-01-17 MED ORDER — EZETIMIBE 10 MG PO TABS: 10.0000 mg | ORAL_TABLET | Freq: Every day | ORAL | 1 refills | Status: DC

## 2024-01-17 NOTE — Progress Notes (Signed)
 01/17/2024, 5:54 PM   Endocrinology follow-up note  Subjective:    Patient ID: Samantha [name], female    DOB: 09-29-1945, PCP Campbell Reynolds, NP   Past Medical History:  Diagnosis Date   Allergic rhinitis    Arthritis of both knees    Arthritis of both shoulder regions    Bilateral carpal tunnel syndrome    Cataract    Chronic low back pain    CKD (chronic kidney disease), stage III (HCC)    Decreased vision    Diabetes mellitus    HTN (hypertension)    Hyperlipidemia    Morbid obesity (HCC)    Thyroid  disease    Past Surgical History:  Procedure Laterality Date   CATARACT EXTRACTION     COLONOSCOPY     COLONOSCOPY N/A 09/15/2020   Procedure: COLONOSCOPY;  Surgeon: Shaaron Lamar HERO, MD;  Location: [facility];  Service: Endoscopy;  Laterality: N/A;  ASA II / PM procedure   POLYPECTOMY  09/15/2020   Procedure: POLYPECTOMY;  Surgeon: Shaaron Lamar HERO, MD;  Location: [facility];  Service: Endoscopy;;   TUBAL LIGATION     Social History   Socioeconomic History   Marital status: Widowed    Spouse name: Not on file   Number of children: Not on file   Years of education: Not on file   Highest education level: Not on file  Occupational History   Not on file  Tobacco Use   Smoking status: Never   Smokeless tobacco: Never  Vaping Use   Vaping status: Never Used  Substance and Sexual Activity   Alcohol use: Yes    Comment: occasionally   Drug use: No   Sexual activity: Yes    Birth control/protection: Surgical    Comment: tubal  Other Topics Concern   Not on file  Social History Narrative   Not on file   Social Drivers of Health   Financial Resource Strain: Low Risk  (11/16/2022)   Overall Financial Resource Strain (CARDIA)    Difficulty of Paying Living Expenses: Not very hard  Food Insecurity: No Food Insecurity (12/08/2023)   Hunger Vital Sign    Worried About Running Out of Food in the Last Year: Never true     Ran Out of Food in the Last Year: Never true  Transportation Needs: No Transportation Needs (12/08/2023)   PRAPARE - Administrator, Civil Service (Medical): No    Lack of Transportation (Non-Medical): No  Physical Activity: Inactive (11/16/2022)   Exercise Vital Sign    Days of Exercise per Week: 0 days    Minutes of Exercise per Session: 0 min  Stress: No Stress Concern Present (11/16/2022)   [employer] of Occupational Health - Occupational Stress Questionnaire    Feeling of Stress : Only a little  Social Connections: Moderately Integrated (12/08/2023)   Social Connection and Isolation Panel    Frequency of Communication with Friends and Family: Three times a week    Frequency of Social Gatherings with Friends and Family: Three times a week    Attends Religious Services: More than 4 times per year    Active Member of Clubs or Organizations: Yes  Attends Banker Meetings: More than 4 times per year    Marital Status: Widowed   Family History  Problem Relation Age of Onset   Hypertension Father    Hypertension Mother    Diabetes Mother    Breast cancer Mother    Hypertension Sister    Cancer Sister        pancreatic   Cancer Sister        pancreatic   Liver disease Sister    Outpatient Encounter Medications as of 01/17/2024  Medication Sig   ezetimibe  (ZETIA ) 10 MG tablet Take 1 tablet (10 mg total) by mouth at bedtime.   valsartan -hydrochlorothiazide (DIOVAN -HCT) 320-12.5 MG tablet Take 1 tablet by mouth every morning.   acetaminophen  (TYLENOL ) 500 MG tablet Take 500-1,000 mg by mouth every 6 (six) hours as needed for mild pain.   apixaban  (ELIQUIS ) 5 MG TABS tablet Take 1 tablet (5 mg total) by mouth 2 (two) times daily.   colchicine  0.6 MG tablet Take 1 tablet (0.6 mg total) by mouth daily.   ferrous sulfate  325 (65 FE) MG tablet Take 325 mg by mouth daily.   folic acid  (FOLVITE ) 1 MG tablet Take 1 mg by mouth every morning.   hydrALAZINE   (APRESOLINE ) 100 MG tablet Take 100 mg by mouth 2 (two) times daily. (Patient not taking: Reported on 01/17/2024)   metFORMIN  (GLUCOPHAGE -XR) 500 MG 24 hr tablet Take 1 tablet (500 mg total) by mouth 2 (two) times daily with a meal.   methimazole  (TAPAZOLE ) 10 MG tablet Take 1 tablet (10 mg total) by mouth daily with breakfast.   metoprolol  tartrate (LOPRESSOR ) 100 MG tablet Take 100 mg by mouth 2 (two) times daily.   pantoprazole  (PROTONIX ) 40 MG tablet Take 1 tablet (40 mg total) by mouth daily.   predniSONE  (DELTASONE ) 5 MG tablet Take 6 tablets (30 mg total) by mouth daily with breakfast for 7 days, THEN 5 tablets (25 mg total) daily with breakfast for 7 days, THEN 4 tablets (20 mg total) daily with breakfast for 7 days, THEN 3 tablets (15 mg total) daily with breakfast for 7 days, THEN 2 tablets (10 mg total) daily with breakfast for 7 days, THEN 1 tablet (5 mg total) daily with breakfast for 7 days.   spironolactone  (ALDACTONE ) 25 MG tablet Take 1 tablet (25 mg total) by mouth daily.   [DISCONTINUED] rosuvastatin  (CRESTOR ) 5 MG tablet Take 5 mg by mouth daily.   No facility-administered encounter medications on file as of 01/17/2024.   ALLERGIES: No Known Allergies   VACCINATION STATUS: Immunization History  Administered Date(s) Administered   H1N1 06/30/2008   Influenza Whole 03/12/2006, 06/03/2007   Moderna Sars-Covid-2 Vaccination 07/20/2019, 08/20/2019   Pneumococcal Polysaccharide-23 01/22/2007    HPI Samantha [name] is 78 y.o. female who presents today with a medical history as above. she is being seen in follow-up after she was seen in consultation for hyperthyroidism requested by Campbell Reynolds, NP.   She was diagnosed with primary hyperthyroidism based on thyroid  uptake and scan in May 2024.  After reviewing her options, incretin is doing a 59.4% 24-hour uptake showing enlarged multinodular thyroid , she was treated with radioactive iodine treatment on November 28, 2022 with 20 mCi of  I-131 on November 28, 2022.  Her subsequent thyroid  function tests did not confirm treatment response.  Her thyroid  function tests results never return to normal nor drop below normal.   That necessitated initiation of methimazole  treatment, currently on methimazole  10 mg  p.o. daily.  Her thyroid  function test are only slightly improving. She does not have typical clinical symptoms of hyperthyroidism at this time.  As it was the case even prior to her first treatment, she denies symptoms specifically denying palpitations, tremors, heat or cold intolerance.  She denies dysphagia, shortness of breath, nor voice change.   She denies family history of thyroid  problems.   She does not have  family history of thyroid  malignancy.  She presents with minimally fluctuating body weight.   Her other medical problems include uncontrolled hypertension on treatment with amlodipine  and lisinopril.  She remains on metformin  500 mg p.o. once a day with breakfast for type 2 diabetes with recent A1c around 7%.     Review of Systems  Constitutional: +mildly fluctuating body weight with recent weight gain, no fatigue, no subjective hyperthermia, no subjective hypothermia Eyes: no blurry vision, no xerophthalmia  Neurological: no tremors, no numbness, no tingling, no dizziness Psychiatric: no depression,  Objective:       01/17/2024    4:05 PM 12/19/2023    3:00 PM 12/10/2023    7:59 AM  Vitals with BMI  Height 5' 1 5' 1   Weight 147 lbs 152 lbs 3 oz   BMI 27.79 28.77   Systolic 144 118 855  Diastolic 90 80 84  Pulse 56 65 81    BP (!) 144/90   Pulse (!) 56   Ht 5' 1 (1.549 m)   Wt 147 lb (66.7 kg)   BMI 27.78 kg/m   Wt Readings from Last 3 Encounters:  01/17/24 147 lb (66.7 kg)  12/19/23 152 lb 3.2 oz (69 kg)  12/07/23 145 lb (65.8 kg)    Physical Exam  Constitutional:  Body mass index is 27.78 kg/m.,  not in acute distress, + anxious state of mind.  She ambulates with a walker due to  disequilibrium. Eyes: PERRLA, EOMI, no exophthalmos    CMP ( most recent) CMP     Component Value Date/Time   NA 136 12/09/2023 1245   NA 142 09/12/2023 1627   K 3.6 12/09/2023 1245   CL 108 12/09/2023 1245   CO2 17 (L) 12/09/2023 1245   GLUCOSE 222 (H) 12/09/2023 1245   BUN 15 12/09/2023 1245   BUN 17 09/12/2023 1627   CREATININE 1.29 (H) 12/09/2023 1245   CALCIUM  8.7 (L) 12/09/2023 1245   PROT 7.4 12/07/2023 1511   PROT 6.8 09/12/2023 1627   ALBUMIN 3.0 (L) 12/07/2023 1511   ALBUMIN 4.3 09/12/2023 1627   AST 12 (L) 12/07/2023 1511   ALT 9 12/07/2023 1511   ALKPHOS 89 12/07/2023 1511   BILITOT 1.2 12/07/2023 1511   BILITOT 0.3 09/12/2023 1627   GFRNONAA 43 (L) 12/09/2023 1245   GFRAA 55 (L) 05/08/2018 2247     Diabetic Labs (most recent): Lab Results  Component Value Date   HGBA1C 6.6 (H) 12/08/2023   HGBA1C 7.1 (H) 03/08/2023   HGBA1C 9.8 (H) 01/23/2023   MICROALBUR 1.54 02/09/2009   MICROALBUR 0.64 01/16/2008   MICROALBUR 1.69 12/03/2006     Lipid Panel ( most recent) Lipid Panel     Component Value Date/Time   CHOL 283 (H) 01/15/2024 1343   TRIG 74 01/15/2024 1343   HDL 128 01/15/2024 1343   CHOLHDL 2.2 01/15/2024 1343   CHOLHDL 2.8 Ratio 02/09/2009 2026   VLDL 17 02/09/2009 2026   LDLCALC 144 (H) 01/15/2024 1343   LABVLDL 11 01/15/2024 1343      Lab  Results  Component Value Date   TSH 0.036 (L) 01/15/2024   TSH 0.017 (L) 12/07/2023   TSH 0.150 (L) 09/12/2023   TSH 3.780 07/24/2023   TSH <0.005 (L) 06/19/2023   FREET4 1.90 (H) 01/15/2024   FREET4 2.72 (H) 12/07/2023   FREET4 1.57 09/12/2023   FREET4 0.49 (L) 07/24/2023   FREET4 2.04 (H) 06/19/2023     Thyroid  uptake and scan on Oct 20, 2022 FINDINGS: Unremarkable LEFT thyroid  lobe.   Enlarged RIGHT thyroid  lobe containing warm and cold nodules.   Patient has previously undergone biopsy of an indeterminate nodule in the RIGHT lobe.   4 hour I-123 uptake = 29.9% (normal 5-20%), 24  hour I-123 uptake = 59.4% (normal 10-30%)   IMPRESSION: Enlarged multinodular thyroid  gland especially RIGHT lobe. Elevated 4 hour and 24 hour radio iodine uptakes consistent with hyperthyroidism.  Nuclear medicine radioactive iodine therapy for hyperthyroidism on November 28, 2022 IMPRESSION: Per oral administration of I-131 sodium iodide for the treatment of hyperthyroidism.  20 mCi of I-131.    Assessment & Plan:   1. Hyperthyroidism 2.  Toxic multinodular goiter 3.  Type 2 diabetes  -After appropriate workup which showed primary hyperthyroidism as a result of toxic multinodular goiter, she was treated with radioactive iodine treatment on November 28, 2022.  Her subsequent thyroid  function tests were consistent with likely treatment failure.  She is currently responding to methimazole  10 mg p.o. daily.  She reports better consistency taking her medications this time.  Her thyroid  function test are only slightly improving.   Options were discussed with her including repeat treatment with radioactive iodine.   To facilitate this intervention, she is advised to hold her methimazole  5 days before her uptake and scan studies.  - She will return in 15 days with uptake and scan results.  If she is found to have significant uptake, she will be considered for I-131 repeat treatment for treatment failure.  Complications of untreated hyperthyroidism were discussed in detail with her including the risk of thyroid  storm.  -Regarding her hypertension: She is currently on valsartan -320-12.5 mg p.o. daily, spironolactone  25 mg p.o. daily, metoprolol  100 mg p.o. twice daily.  Her blood pressure still not controlled.  There is a question of compliance with medications in this patient.  She is planning to see her PCP tomorrow.  She also has type 2 diabetes controlled with A1c of 6.6%.  She is currently on metformin  500 mg twice daily.   - she is advised to maintain close follow up with Campbell Reynolds, NP for  primary care needs.   I spent  20  minutes in the care of the patient today including review of labs from Thyroid  Function, CMP, and other relevant labs ; imaging/biopsy records (current and previous including abstractions from other facilities); face-to-face time discussing  her lab results and symptoms, medications doses, her options of short and long term treatment based on the latest standards of care / guidelines;   and documenting the encounter.  Samantha [name]  participated in the discussions, expressed understanding, and voiced agreement with the above plans.  All questions were answered to her satisfaction. she is encouraged to contact clinic should she have any questions or concerns prior to her return visit.   Follow up plan: Return in about 2 weeks (around 01/31/2024) for F/U with Thyroid  Uptake and Scan.   Ranny Earl, MD [facility] [address] [place], [place] [address] Phone: [phone]  Fax:  [phone]     01/17/2024, 5:54 PM  This note was partially dictated with voice recognition software. Similar sounding words can be transcribed inadequately or may not  be corrected upon review.

## 2024-01-22 ENCOUNTER — Other Ambulatory Visit: Payer: Self-pay | Admitting: Student

## 2024-01-22 ENCOUNTER — Ambulatory Visit
Admission: RE | Admit: 2024-01-22 | Discharge: 2024-01-22 | Disposition: A | Discharge status: Home / Self Care | Source: Ambulatory Visit | Attending: Student

## 2024-01-22 DIAGNOSIS — M79605 Pain in left leg: Secondary | ICD-10-CM

## 2024-01-30 ENCOUNTER — Telehealth: Payer: Self-pay | Admitting: "Endocrinology

## 2024-01-30 NOTE — Telephone Encounter (Signed)
 I called pt to see when she was going to schedule her Uptake/Scan because she is on the schedule to review the results on 8/25. No answer.

## 2024-01-31 ENCOUNTER — Encounter (HOSPITAL_COMMUNITY): Payer: Self-pay | Admitting: "Endocrinology

## 2024-01-31 DIAGNOSIS — R7989 Other specified abnormal findings of blood chemistry: Secondary | ICD-10-CM

## 2024-02-01 NOTE — Telephone Encounter (Signed)
 Tried to call pt, did not receive an answer and was unable to leave a message due to her voicemail being full.

## 2024-02-01 NOTE — Telephone Encounter (Signed)
 Patient has not had her uptake/scan because when they called they told her she had to stop 2 of the medications she was on. Please Advise. I am canceling her appt on Monday.

## 2024-02-04 ENCOUNTER — Ambulatory Visit: Admitting: "Endocrinology

## 2024-02-04 ENCOUNTER — Telehealth: Payer: Self-pay | Admitting: Cardiology

## 2024-02-04 NOTE — Telephone Encounter (Signed)
 Spoke with pt making her aware that her Methimazole  needs to be held at least 5 days prior to her uptake & scan per Dr.Nida. Pt voiced understanding.

## 2024-02-04 NOTE — Telephone Encounter (Signed)
Unable to reach pt or leave a message mailbox is full 

## 2024-02-04 NOTE — Telephone Encounter (Signed)
 Patient reports she has been feeling fatigued for the past 2 days. She has also been having right sided pain around her breast area, the pain is worse when she takes a deep breath.  She reports this feels similar to when she had pneumonia, denies fever, cough, chest pain or SOB.  She has an appt with PCP on Friday 8/29. She states she called our office first because she feels Samantha [name] is thorough and she wanted to know what she thought and recommended.  Advised patient to keep appt with PCP on Friday. Will forward message to Samantha [name] to review.

## 2024-02-04 NOTE — Telephone Encounter (Signed)
 Patient is returning call.

## 2024-02-04 NOTE — Telephone Encounter (Signed)
 Pt states she's been very fatigue for the last two days but no other symptoms.

## 2024-02-05 NOTE — Telephone Encounter (Signed)
 Vicci Rollo SAUNDERS, PA-C to Vicci Roxie CROME, RN     02/04/24  4:16 PM I'm sorry to hear that she is not feeling well- I agree that she should keep her appointment with her primary care provider. If she is having pneumonia again, she will need a physical exam, labs, and likely imaging to confirm.    Is she still taking her colchicine ? Has she completed her course of prednisone ?    Thanks JORETTA Pac the patient the information above.  She is still taking Colchicine  and the prednisone  will be completed tomorrow.   Informed her that I will send this information to her provider. She verbalized understanding and will keep her appt with her pcp

## 2024-02-08 ENCOUNTER — Emergency Department (HOSPITAL_COMMUNITY)

## 2024-02-08 ENCOUNTER — Inpatient Hospital Stay (HOSPITAL_COMMUNITY)
Admission: EM | Admit: 2024-02-08 | Discharge: 2024-02-12 | DRG: 638 | Disposition: A | Discharge status: Home Health | Attending: Internal Medicine | Admitting: Internal Medicine

## 2024-02-08 DIAGNOSIS — Z833 Family history of diabetes mellitus: Secondary | ICD-10-CM

## 2024-02-08 DIAGNOSIS — Z79899 Other long term (current) drug therapy: Secondary | ICD-10-CM

## 2024-02-08 DIAGNOSIS — Z803 Family history of malignant neoplasm of breast: Secondary | ICD-10-CM

## 2024-02-08 DIAGNOSIS — I129 Hypertensive chronic kidney disease with stage 1 through stage 4 chronic kidney disease, or unspecified chronic kidney disease: Secondary | ICD-10-CM | POA: Diagnosis present

## 2024-02-08 DIAGNOSIS — I10 Essential (primary) hypertension: Secondary | ICD-10-CM | POA: Diagnosis present

## 2024-02-08 DIAGNOSIS — Z79890 Hormone replacement therapy (postmenopausal): Secondary | ICD-10-CM

## 2024-02-08 DIAGNOSIS — Z7901 Long term (current) use of anticoagulants: Secondary | ICD-10-CM

## 2024-02-08 DIAGNOSIS — E785 Hyperlipidemia, unspecified: Secondary | ICD-10-CM | POA: Diagnosis present

## 2024-02-08 DIAGNOSIS — N179 Acute kidney failure, unspecified: Secondary | ICD-10-CM | POA: Diagnosis present

## 2024-02-08 DIAGNOSIS — G8929 Other chronic pain: Secondary | ICD-10-CM | POA: Diagnosis present

## 2024-02-08 DIAGNOSIS — E0590 Thyrotoxicosis, unspecified without thyrotoxic crisis or storm: Secondary | ICD-10-CM | POA: Diagnosis present

## 2024-02-08 DIAGNOSIS — E1110 Type 2 diabetes mellitus with ketoacidosis without coma: Principal | ICD-10-CM | POA: Diagnosis present

## 2024-02-08 DIAGNOSIS — E1122 Type 2 diabetes mellitus with diabetic chronic kidney disease: Secondary | ICD-10-CM | POA: Diagnosis present

## 2024-02-08 DIAGNOSIS — M545 Low back pain, unspecified: Secondary | ICD-10-CM | POA: Diagnosis present

## 2024-02-08 DIAGNOSIS — Z7984 Long term (current) use of oral hypoglycemic drugs: Secondary | ICD-10-CM

## 2024-02-08 DIAGNOSIS — E860 Dehydration: Secondary | ICD-10-CM | POA: Diagnosis present

## 2024-02-08 DIAGNOSIS — E871 Hypo-osmolality and hyponatremia: Secondary | ICD-10-CM | POA: Diagnosis present

## 2024-02-08 DIAGNOSIS — I480 Paroxysmal atrial fibrillation: Secondary | ICD-10-CM | POA: Diagnosis present

## 2024-02-08 DIAGNOSIS — Z800 Family history of malignant neoplasm of digestive organs: Secondary | ICD-10-CM

## 2024-02-08 DIAGNOSIS — Z8249 Family history of ischemic heart disease and other diseases of the circulatory system: Secondary | ICD-10-CM

## 2024-02-08 DIAGNOSIS — K219 Gastro-esophageal reflux disease without esophagitis: Secondary | ICD-10-CM | POA: Diagnosis present

## 2024-02-08 DIAGNOSIS — E039 Hypothyroidism, unspecified: Secondary | ICD-10-CM | POA: Diagnosis present

## 2024-02-08 DIAGNOSIS — N1832 Chronic kidney disease, stage 3b: Secondary | ICD-10-CM | POA: Diagnosis present

## 2024-02-08 LAB — CBC
HCT: 39.8 % (ref 36.0–46.0)
Hemoglobin: 12 g/dL (ref 12.0–15.0)
MCH: 23.5 pg — ABNORMAL LOW (ref 26.0–34.0)
MCHC: 30.2 g/dL (ref 30.0–36.0)
MCV: 77.9 fL — ABNORMAL LOW (ref 80.0–100.0)
Platelets: 395 K/uL (ref 150–400)
RBC: 5.11 MIL/uL (ref 3.87–5.11)
RDW: 19.9 % — ABNORMAL HIGH (ref 11.5–15.5)
WBC: 9.7 K/uL (ref 4.0–10.5)
nRBC: 0 % (ref 0.0–0.2)

## 2024-02-08 LAB — BASIC METABOLIC PANEL WITH GFR
Anion gap: 16 — ABNORMAL HIGH (ref 5–15)
BUN: 46 mg/dL — ABNORMAL HIGH (ref 8–23)
CO2: 19 mmol/L — ABNORMAL LOW (ref 22–32)
Calcium: 9.3 mg/dL (ref 8.9–10.3)
Chloride: 88 mmol/L — ABNORMAL LOW (ref 98–111)
Creatinine, Ser: 2.3 mg/dL — ABNORMAL HIGH (ref 0.44–1.00)
GFR, Estimated: 21 mL/min — ABNORMAL LOW (ref >60)
Glucose, Bld: 843 mg/dL (ref 70–99)
Potassium: 4.8 mmol/L (ref 3.5–5.1)
Sodium: 123 mmol/L — ABNORMAL LOW (ref 135–145)

## 2024-02-08 LAB — TROPONIN I (HIGH SENSITIVITY)
Troponin I (High Sensitivity): 37 ng/L — ABNORMAL HIGH (ref <18)
Troponin I (High Sensitivity): 32 ng/L — ABNORMAL HIGH (ref <18)

## 2024-02-08 LAB — CBG MONITORING, ED: Glucose-Capillary: >600 mg/dL (ref 70–99)

## 2024-02-08 MED ORDER — LACTATED RINGERS IV SOLN: INTRAVENOUS | Status: DC

## 2024-02-08 MED ORDER — LACTATED RINGERS IV BOLUS
1300.0000 mL | Freq: Once | INTRAVENOUS | Status: AC
  Administered 2024-02-09: 1300 mL via INTRAVENOUS

## 2024-02-08 MED ORDER — POTASSIUM CHLORIDE 10 MEQ/100ML IV SOLN
10.0000 meq | INTRAVENOUS | Status: DC
  Administered 2024-02-09: 10 meq via INTRAVENOUS
  Filled 2024-02-08: qty 100

## 2024-02-08 MED ORDER — INSULIN REGULAR(HUMAN) IN NACL 100-0.9 UT/100ML-% IV SOLN
INTRAVENOUS | Status: DC
  Administered 2024-02-09: 14 [IU]/h via INTRAVENOUS
  Filled 2024-02-08: qty 100

## 2024-02-08 MED ORDER — DEXTROSE IN LACTATED RINGERS 5 % IV SOLN: INTRAVENOUS | Status: DC

## 2024-02-08 MED ORDER — DEXTROSE 50 % IV SOLN: 0.0000 mL | INTRAVENOUS | Status: DC | PRN

## 2024-02-08 NOTE — ED Notes (Signed)
 This nurse called [facility] to have patient admitted and monitored.

## 2024-02-08 NOTE — ED Triage Notes (Signed)
 Pt came in via POV d/t her PCP reporting to her that her EKG was abnormal, denies CP or SOB.

## 2024-02-08 NOTE — ED Provider Notes (Signed)
 [place] EMERGENCY DEPARTMENT AT [facility] Provider Note   CSN: [id number] Arrival date & time: 02/08/24  1859     Patient presents with: Abnormal EKG and Sent from PCP   Samantha [name] is a 78 y.o. female.   The history is provided by the patient and medical records.   78 year old female with history of pericarditis, chronic kidney disease, hyperlipidemia, hypothyroidism, DM2, obesity, presenting to the ED for home feeling poorly.  States she went to her PCP office today for a routine follow-up but admits she has been feeling poorly over the past 3 days.  She has just felt generally weak, fatigued, with poor appetite.  She has had some nausea but denies any vomiting.  Seen at PCP today and found to have abnormal EKG so sent here for further evaluation.  She denies any chest pain or shortness of breath.  Was recently treated for pericarditis but states all of those symptoms seem to have resolved.  Prior to Admission medications   Medication Sig Start Date End Date Taking? Authorizing Provider  acetaminophen  (TYLENOL ) 500 MG tablet Take 500-1,000 mg by mouth every 6 (six) hours as needed for mild pain.    [provider]  apixaban  (ELIQUIS ) 5 MG TABS tablet Take 1 tablet (5 mg total) by mouth 2 (two) times daily. 12/19/23   Vicci Rollo SAUNDERS, PA-C  colchicine  0.6 MG tablet Take 1 tablet (0.6 mg total) by mouth daily. 12/19/23   Vicci Rollo SAUNDERS, PA-C  ezetimibe  (ZETIA ) 10 MG tablet Take 1 tablet (10 mg total) by mouth at bedtime. 01/17/24   Nida, Gebreselassie W, MD  ferrous sulfate  325 (65 FE) MG tablet Take 325 mg by mouth daily. 09/04/23   [provider]  folic acid  (FOLVITE ) 1 MG tablet Take 1 mg by mouth every morning. 06/01/23   [provider]  hydrALAZINE  (APRESOLINE ) 100 MG tablet Take 100 mg by mouth 2 (two) times daily. Patient not taking: Reported on 01/17/2024 06/29/23   [provider]  metFORMIN  (GLUCOPHAGE -XR) 500 MG 24 hr tablet  Take 1 tablet (500 mg total) by mouth 2 (two) times daily with a meal. 03/12/23   Arellano Zameza, Priscila, MD  methimazole  (TAPAZOLE ) 10 MG tablet Take 1 tablet (10 mg total) by mouth daily with breakfast. 09/14/23   Lenis Ethelle LELON, MD  metoprolol  tartrate (LOPRESSOR ) 100 MG tablet Take 100 mg by mouth 2 (two) times daily. 06/21/23   [provider]  pantoprazole  (PROTONIX ) 40 MG tablet Take 1 tablet (40 mg total) by mouth daily. 12/11/23   Darci Pore, MD  spironolactone  (ALDACTONE ) 25 MG tablet Take 1 tablet (25 mg total) by mouth daily. 01/01/24   Vicci Rollo SAUNDERS, PA-C  valsartan -hydrochlorothiazide (DIOVAN -HCT) 320-12.5 MG tablet Take 1 tablet by mouth every morning. 01/11/24   [provider]    Allergies: Patient has no known allergies.    Review of Systems  Neurological:  Positive for weakness (generalized).  All other systems reviewed and are negative.   Updated Vital Signs BP (!) 122/56   Pulse 77   Temp 98.7 F (37.1 C)   Resp 14   SpO2 100%   Physical Exam Vitals and nursing note reviewed.  Constitutional:      Appearance: She is well-developed.     Comments: Elderly, seems weak  HENT:     Head: Normocephalic and atraumatic.     Mouth/Throat:     Comments: Mildly dry mucous membranes Eyes:  Conjunctiva/sclera: Conjunctivae normal.     Pupils: Pupils are equal, round, and reactive to light.  Cardiovascular:     Rate and Rhythm: Normal rate and regular rhythm.     Heart sounds: Normal heart sounds.  Pulmonary:     Effort: Pulmonary effort is normal.     Breath sounds: Normal breath sounds.  Abdominal:     General: Bowel sounds are normal.     Palpations: Abdomen is soft.  Musculoskeletal:        General: Normal range of motion.     Cervical back: Normal range of motion.  Skin:    General: Skin is warm and dry.  Neurological:     Mental Status: She is alert and oriented to person, place, and time.     (all labs ordered  are listed, but only abnormal results are displayed) Labs Reviewed  BASIC METABOLIC PANEL WITH GFR - Abnormal; Notable for the following components:      Result Value   Sodium 123 (*)    Chloride 88 (*)    CO2 19 (*)    Glucose, Bld 843 (*)    BUN 46 (*)    Creatinine, Ser 2.30 (*)    GFR, Estimated 21 (*)    Anion gap 16 (*)    All other components within normal limits  CBC - Abnormal; Notable for the following components:   MCV 77.9 (*)    MCH 23.5 (*)    RDW 19.9 (*)    All other components within normal limits  HEMOGLOBIN A1C - Abnormal; Notable for the following components:   Hgb A1c MFr Bld 13.1 (*)    All other components within normal limits  I-STAT VENOUS BLOOD GAS, ED - Abnormal; Notable for the following components:   pH, Ven 7.465 (*)    pCO2, Ven 30.4 (*)    pO2, Ven 46 (*)    Sodium 122 (*)    Calcium , Ion 1.09 (*)    All other components within normal limits  CBG MONITORING, ED - Abnormal; Notable for the following components:   Glucose-Capillary >600 (*)    All other components within normal limits  TROPONIN I (HIGH SENSITIVITY) - Abnormal; Notable for the following components:   Troponin I (High Sensitivity) 37 (*)    All other components within normal limits  TROPONIN I (HIGH SENSITIVITY) - Abnormal; Notable for the following components:   Troponin I (High Sensitivity) 32 (*)    All other components within normal limits  URINALYSIS, W/ REFLEX TO CULTURE (INFECTION SUSPECTED)  BETA-HYDROXYBUTYRIC ACID    EKG: None  Radiology: DG Chest 2 View Result Date: 02/08/2024 CLINICAL DATA:  Abnormal EKG fatigue EXAM: CHEST - 2 VIEW COMPARISON:  Chest CT and x-ray 12/07/2023 FINDINGS: The heart size and mediastinal contours are within normal limits. Both lungs are clear. Multilevel degenerative osteophytes. Atelectasis or scarring at the bases. IMPRESSION: No active cardiopulmonary disease. Electronically Signed   By: Luke Bun M.D.   On: 02/08/2024 20:43      Procedures   CRITICAL CARE Performed by: Olam CHRISTELLA Slocumb   Total critical care time: 45 minutes  Critical care time was exclusive of separately billable procedures and treating other patients.  Critical care was necessary to treat or prevent imminent or life-threatening deterioration.  Critical care was time spent personally by me on the following activities: development of treatment plan with patient and/or surrogate as well as nursing, discussions with consultants, evaluation of patient's response to treatment, examination  of patient, obtaining history from patient or surrogate, ordering and performing treatments and interventions, ordering and review of laboratory studies, ordering and review of radiographic studies, pulse oximetry and re-evaluation of patient's condition.   Medications Ordered in the ED  insulin  regular, human (MYXREDLIN ) 100 units/ 100 mL infusion (has no administration in time range)  lactated ringers  infusion (has no administration in time range)  dextrose  5 % in lactated ringers  infusion (has no administration in time range)  dextrose  50 % solution 0-50 mL (has no administration in time range)  potassium chloride  10 mEq in 100 mL IVPB (10 mEq Intravenous New Bag/Given 02/09/24 0019)  lactated ringers  bolus 1,300 mL (1,300 mLs Intravenous New Bag/Given 02/09/24 0016)                                    Medical Decision Making Amount and/or Complexity of Data Reviewed Labs: ordered. Radiology: ordered and independent interpretation performed. ECG/medicine tests: ordered and independent interpretation performed.  Risk Prescription drug management. Decision regarding hospitalization.   78 year old female presenting to the ED from PCP office for abnormal EKG.  She has not had any chest pain or shortness of breath.  Has been feeling generally weak for the past 3 days or so.  He is afebrile and nontoxic in appearance here.  She is hemodynamically stable  on room air.  She does not have any focal weakness but more so feels globally weak.  EKG here is unchanged from prior.  Labs with findings of likely early DKA-- glucose 843, bicarb 19, gap 16.  Her pH is fortunately normal.  Does report recent steroid taper due to ongoing pericarditis, likely cause.  She has been compliant with her metformin .  Also has AKI today with SrCr 2.3, baseline around 1.2.  Trops 35 and 32 respectively, may be due to AKI.  No obvious EKG changes today, denies chest pain or SOB.  Doubt ACS.    Results discussed with patient and daughter.  She will require admission.  Initiated IV fluid and insulin  drip.  Spoke with hospitalist, Dr. Lorren-- will admit for ongoing care.  Final diagnoses:  Diabetic ketoacidosis without coma associated with type 2 diabetes mellitus (HCC)  AKI (acute kidney injury) [facility])    ED Discharge Orders     None          Jarold Olam HERO, PA-C 02/09/24 0023    Emil Share, DO 02/09/24 1505

## 2024-02-09 ENCOUNTER — Other Ambulatory Visit: Payer: Self-pay

## 2024-02-09 ENCOUNTER — Encounter (HOSPITAL_COMMUNITY): Payer: Self-pay

## 2024-02-09 DIAGNOSIS — Z803 Family history of malignant neoplasm of breast: Secondary | ICD-10-CM | POA: Diagnosis not present

## 2024-02-09 DIAGNOSIS — E871 Hypo-osmolality and hyponatremia: Secondary | ICD-10-CM | POA: Diagnosis present

## 2024-02-09 DIAGNOSIS — I129 Hypertensive chronic kidney disease with stage 1 through stage 4 chronic kidney disease, or unspecified chronic kidney disease: Secondary | ICD-10-CM | POA: Diagnosis present

## 2024-02-09 DIAGNOSIS — Z800 Family history of malignant neoplasm of digestive organs: Secondary | ICD-10-CM | POA: Diagnosis not present

## 2024-02-09 DIAGNOSIS — K219 Gastro-esophageal reflux disease without esophagitis: Secondary | ICD-10-CM | POA: Diagnosis present

## 2024-02-09 DIAGNOSIS — N1832 Chronic kidney disease, stage 3b: Secondary | ICD-10-CM | POA: Diagnosis present

## 2024-02-09 DIAGNOSIS — Z8249 Family history of ischemic heart disease and other diseases of the circulatory system: Secondary | ICD-10-CM | POA: Diagnosis not present

## 2024-02-09 DIAGNOSIS — G8929 Other chronic pain: Secondary | ICD-10-CM | POA: Diagnosis present

## 2024-02-09 DIAGNOSIS — Z79890 Hormone replacement therapy (postmenopausal): Secondary | ICD-10-CM | POA: Diagnosis not present

## 2024-02-09 DIAGNOSIS — E860 Dehydration: Secondary | ICD-10-CM | POA: Diagnosis present

## 2024-02-09 DIAGNOSIS — M545 Low back pain, unspecified: Secondary | ICD-10-CM | POA: Diagnosis present

## 2024-02-09 DIAGNOSIS — E039 Hypothyroidism, unspecified: Secondary | ICD-10-CM | POA: Diagnosis present

## 2024-02-09 DIAGNOSIS — Z833 Family history of diabetes mellitus: Secondary | ICD-10-CM | POA: Diagnosis not present

## 2024-02-09 DIAGNOSIS — E1122 Type 2 diabetes mellitus with diabetic chronic kidney disease: Secondary | ICD-10-CM | POA: Diagnosis present

## 2024-02-09 DIAGNOSIS — N179 Acute kidney failure, unspecified: Secondary | ICD-10-CM | POA: Diagnosis present

## 2024-02-09 DIAGNOSIS — I480 Paroxysmal atrial fibrillation: Secondary | ICD-10-CM | POA: Diagnosis present

## 2024-02-09 DIAGNOSIS — Z79899 Other long term (current) drug therapy: Secondary | ICD-10-CM | POA: Diagnosis not present

## 2024-02-09 DIAGNOSIS — E0590 Thyrotoxicosis, unspecified without thyrotoxic crisis or storm: Secondary | ICD-10-CM | POA: Diagnosis present

## 2024-02-09 DIAGNOSIS — Z7984 Long term (current) use of oral hypoglycemic drugs: Secondary | ICD-10-CM | POA: Diagnosis not present

## 2024-02-09 DIAGNOSIS — Z7901 Long term (current) use of anticoagulants: Secondary | ICD-10-CM | POA: Diagnosis not present

## 2024-02-09 DIAGNOSIS — E1110 Type 2 diabetes mellitus with ketoacidosis without coma: Secondary | ICD-10-CM | POA: Diagnosis present

## 2024-02-09 DIAGNOSIS — E785 Hyperlipidemia, unspecified: Secondary | ICD-10-CM | POA: Diagnosis present

## 2024-02-09 LAB — CBC
HCT: 35.3 % — ABNORMAL LOW (ref 36.0–46.0)
Hemoglobin: 10.6 g/dL — ABNORMAL LOW (ref 12.0–15.0)
MCH: 23.6 pg — ABNORMAL LOW (ref 26.0–34.0)
MCHC: 30 g/dL (ref 30.0–36.0)
MCV: 78.4 fL — ABNORMAL LOW (ref 80.0–100.0)
Platelets: 302 K/uL (ref 150–400)
RBC: 4.5 MIL/uL (ref 3.87–5.11)
RDW: 19.6 % — ABNORMAL HIGH (ref 11.5–15.5)
WBC: 8.7 K/uL (ref 4.0–10.5)
nRBC: 0 % (ref 0.0–0.2)

## 2024-02-09 LAB — CBG MONITORING, ED
Glucose-Capillary: 342 mg/dL — ABNORMAL HIGH (ref 70–99)
Glucose-Capillary: 426 mg/dL — ABNORMAL HIGH (ref 70–99)
Glucose-Capillary: 460 mg/dL — ABNORMAL HIGH (ref 70–99)
Glucose-Capillary: >600 mg/dL (ref 70–99)
Glucose-Capillary: >600 mg/dL (ref 70–99)

## 2024-02-09 LAB — I-STAT VENOUS BLOOD GAS, ED
Acid-base deficit: 1 mmol/L (ref 0.0–2.0)
Bicarbonate: 21.9 mmol/L (ref 20.0–28.0)
Calcium, Ion: 1.09 mmol/L — ABNORMAL LOW (ref 1.15–1.40)
HCT: 38 % (ref 36.0–46.0)
Hemoglobin: 12.9 g/dL (ref 12.0–15.0)
O2 Saturation: 85 %
Potassium: 5 mmol/L (ref 3.5–5.1)
Sodium: 122 mmol/L — ABNORMAL LOW (ref 135–145)
TCO2: 23 mmol/L (ref 22–32)
pCO2, Ven: 30.4 mmHg — ABNORMAL LOW (ref 44–60)
pH, Ven: 7.465 — ABNORMAL HIGH (ref 7.25–7.43)
pO2, Ven: 46 mmHg — ABNORMAL HIGH (ref 32–45)

## 2024-02-09 LAB — GLUCOSE, CAPILLARY
Glucose-Capillary: 338 mg/dL — ABNORMAL HIGH (ref 70–99)
Glucose-Capillary: 238 mg/dL — ABNORMAL HIGH (ref 70–99)
Glucose-Capillary: 184 mg/dL — ABNORMAL HIGH (ref 70–99)
Glucose-Capillary: 172 mg/dL — ABNORMAL HIGH (ref 70–99)
Glucose-Capillary: 156 mg/dL — ABNORMAL HIGH (ref 70–99)
Glucose-Capillary: 176 mg/dL — ABNORMAL HIGH (ref 70–99)
Glucose-Capillary: 318 mg/dL — ABNORMAL HIGH (ref 70–99)
Glucose-Capillary: 390 mg/dL — ABNORMAL HIGH (ref 70–99)
Glucose-Capillary: 294 mg/dL — ABNORMAL HIGH (ref 70–99)

## 2024-02-09 LAB — CBC WITH DIFFERENTIAL/PLATELET
Abs Immature Granulocytes: 0.28 K/uL — ABNORMAL HIGH (ref 0.00–0.07)
Basophils Absolute: 0.1 K/uL (ref 0.0–0.1)
Basophils Relative: 1 %
Eosinophils Absolute: 0.1 K/uL (ref 0.0–0.5)
Eosinophils Relative: 1 %
HCT: 36.5 % (ref 36.0–46.0)
Hemoglobin: 10.9 g/dL — ABNORMAL LOW (ref 12.0–15.0)
Immature Granulocytes: 3 %
Lymphocytes Relative: 33 %
Lymphs Abs: 3.3 K/uL (ref 0.7–4.0)
MCH: 23.4 pg — ABNORMAL LOW (ref 26.0–34.0)
MCHC: 29.9 g/dL — ABNORMAL LOW (ref 30.0–36.0)
MCV: 78.3 fL — ABNORMAL LOW (ref 80.0–100.0)
Monocytes Absolute: 1.1 K/uL — ABNORMAL HIGH (ref 0.1–1.0)
Monocytes Relative: 10 %
Neutro Abs: 5.4 K/uL (ref 1.7–7.7)
Neutrophils Relative %: 52 %
Platelets: 308 K/uL (ref 150–400)
RBC: 4.66 MIL/uL (ref 3.87–5.11)
RDW: 19.7 % — ABNORMAL HIGH (ref 11.5–15.5)
WBC: 10.2 K/uL (ref 4.0–10.5)
nRBC: 0 % (ref 0.0–0.2)

## 2024-02-09 LAB — BASIC METABOLIC PANEL WITH GFR
Anion gap: 15 (ref 5–15)
Anion gap: 14 (ref 5–15)
BUN: 44 mg/dL — ABNORMAL HIGH (ref 8–23)
BUN: 39 mg/dL — ABNORMAL HIGH (ref 8–23)
CO2: 17 mmol/L — ABNORMAL LOW (ref 22–32)
CO2: 18 mmol/L — ABNORMAL LOW (ref 22–32)
Calcium: 8.8 mg/dL — ABNORMAL LOW (ref 8.9–10.3)
Calcium: 8.9 mg/dL (ref 8.9–10.3)
Chloride: 97 mmol/L — ABNORMAL LOW (ref 98–111)
Chloride: 101 mmol/L (ref 98–111)
Creatinine, Ser: 2.03 mg/dL — ABNORMAL HIGH (ref 0.44–1.00)
Creatinine, Ser: 1.72 mg/dL — ABNORMAL HIGH (ref 0.44–1.00)
GFR, Estimated: 25 mL/min — ABNORMAL LOW (ref >60)
GFR, Estimated: 30 mL/min — ABNORMAL LOW (ref >60)
Glucose, Bld: 472 mg/dL — ABNORMAL HIGH (ref 70–99)
Glucose, Bld: 334 mg/dL — ABNORMAL HIGH (ref 70–99)
Potassium: 3.7 mmol/L (ref 3.5–5.1)
Potassium: 3.9 mmol/L (ref 3.5–5.1)
Sodium: 129 mmol/L — ABNORMAL LOW (ref 135–145)
Sodium: 133 mmol/L — ABNORMAL LOW (ref 135–145)

## 2024-02-09 LAB — T4, FREE: Free T4: 2.68 ng/dL — ABNORMAL HIGH (ref 0.61–1.12)

## 2024-02-09 LAB — HEMOGLOBIN A1C
Hgb A1c MFr Bld: 13.1 % — ABNORMAL HIGH (ref 4.8–5.6)
Mean Plasma Glucose: 329.27 mg/dL

## 2024-02-09 LAB — MAGNESIUM: Magnesium: 1.7 mg/dL (ref 1.7–2.4)

## 2024-02-09 LAB — TSH: TSH: <0.1 u[IU]/mL — ABNORMAL LOW (ref 0.350–4.500)

## 2024-02-09 LAB — PHOSPHORUS: Phosphorus: 2.2 mg/dL — ABNORMAL LOW (ref 2.5–4.6)

## 2024-02-09 LAB — BETA-HYDROXYBUTYRIC ACID: Beta-Hydroxybutyric Acid: 2.22 mmol/L — ABNORMAL HIGH (ref 0.05–0.27)

## 2024-02-09 LAB — TROPONIN I (HIGH SENSITIVITY)
Troponin I (High Sensitivity): 30 ng/L — ABNORMAL HIGH (ref <18)
Troponin I (High Sensitivity): 36 ng/L — ABNORMAL HIGH (ref <18)

## 2024-02-09 MED ORDER — METOPROLOL TARTRATE 100 MG PO TABS: 100.0000 mg | ORAL_TABLET | Freq: Two times a day (BID) | ORAL | Status: DC

## 2024-02-09 MED ORDER — COLCHICINE 0.6 MG PO TABS
0.6000 mg | ORAL_TABLET | Freq: Every day | ORAL | Status: DC
  Administered 2024-02-09 – 2024-02-12 (×4): 0.6 mg via ORAL
  Filled 2024-02-09 (×5): qty 1

## 2024-02-09 MED ORDER — FERROUS SULFATE 325 (65 FE) MG PO TABS
325.0000 mg | ORAL_TABLET | Freq: Every day | ORAL | Status: DC
  Administered 2024-02-09 – 2024-02-12 (×4): 325 mg via ORAL
  Filled 2024-02-09 (×4): qty 1

## 2024-02-09 MED ORDER — HYDRALAZINE HCL 50 MG PO TABS: 100.0000 mg | ORAL_TABLET | Freq: Two times a day (BID) | ORAL | Status: DC

## 2024-02-09 MED ORDER — INSULIN ASPART 100 UNIT/ML IJ SOLN
0.0000 [IU] | Freq: Every day | INTRAMUSCULAR | Status: DC
  Administered 2024-02-09: 3 [IU] via SUBCUTANEOUS
  Administered 2024-02-10: 2 [IU] via SUBCUTANEOUS

## 2024-02-09 MED ORDER — AMIODARONE HCL IN DEXTROSE 360-4.14 MG/200ML-% IV SOLN: 30.0000 mg/h | INTRAVENOUS | Status: DC

## 2024-02-09 MED ORDER — DILTIAZEM HCL 25 MG/5ML IV SOLN
10.0000 mg | Freq: Once | INTRAVENOUS | Status: AC
  Administered 2024-02-09: 10 mg via INTRAVENOUS
  Filled 2024-02-09: qty 5

## 2024-02-09 MED ORDER — PANTOPRAZOLE SODIUM 40 MG PO TBEC
40.0000 mg | DELAYED_RELEASE_TABLET | Freq: Every day | ORAL | Status: DC
  Administered 2024-02-09 – 2024-02-12 (×4): 40 mg via ORAL
  Filled 2024-02-09 (×4): qty 1

## 2024-02-09 MED ORDER — DILTIAZEM LOAD VIA INFUSION
10.0000 mg | Freq: Once | INTRAVENOUS | Status: DC
  Filled 2024-02-09: qty 10

## 2024-02-09 MED ORDER — DILTIAZEM HCL-DEXTROSE 125-5 MG/125ML-% IV SOLN (PREMIX)
5.0000 mg/h | INTRAVENOUS | Status: DC
  Administered 2024-02-09: 5 mg/h via INTRAVENOUS
  Filled 2024-02-09: qty 125

## 2024-02-09 MED ORDER — INSULIN GLARGINE 100 UNIT/ML ~~LOC~~ SOLN
15.0000 [IU] | Freq: Every day | SUBCUTANEOUS | Status: DC
  Administered 2024-02-09: 15 [IU] via SUBCUTANEOUS
  Filled 2024-02-09 (×2): qty 0.15

## 2024-02-09 MED ORDER — HYDRALAZINE HCL 50 MG PO TABS
50.0000 mg | ORAL_TABLET | Freq: Three times a day (TID) | ORAL | Status: DC
  Administered 2024-02-09 – 2024-02-11 (×9): 50 mg via ORAL
  Filled 2024-02-09 (×10): qty 1

## 2024-02-09 MED ORDER — POTASSIUM PHOSPHATES 15 MMOLE/5ML IV SOLN
30.0000 mmol | Freq: Once | INTRAVENOUS | Status: AC
  Administered 2024-02-09: 30 mmol via INTRAVENOUS
  Filled 2024-02-09: qty 10

## 2024-02-09 MED ORDER — SODIUM CHLORIDE 0.9 % IV SOLN: INTRAVENOUS | Status: DC

## 2024-02-09 MED ORDER — METHIMAZOLE 5 MG PO TABS
10.0000 mg | ORAL_TABLET | Freq: Two times a day (BID) | ORAL | Status: DC
  Administered 2024-02-09 – 2024-02-12 (×7): 10 mg via ORAL
  Filled 2024-02-09 (×8): qty 2

## 2024-02-09 MED ORDER — METOPROLOL TARTRATE 100 MG PO TABS
100.0000 mg | ORAL_TABLET | Freq: Two times a day (BID) | ORAL | Status: DC
Start: 2024-02-09 — End: 2024-02-12
  Administered 2024-02-09 – 2024-02-12 (×7): 100 mg via ORAL
  Filled 2024-02-09 (×7): qty 1

## 2024-02-09 MED ORDER — INSULIN ASPART 100 UNIT/ML IJ SOLN
0.0000 [IU] | Freq: Three times a day (TID) | INTRAMUSCULAR | Status: DC
  Administered 2024-02-09 (×2): 11 [IU] via SUBCUTANEOUS
  Administered 2024-02-10: 8 [IU] via SUBCUTANEOUS
  Administered 2024-02-10 – 2024-02-11 (×2): 15 [IU] via SUBCUTANEOUS
  Administered 2024-02-11 (×2): 5 [IU] via SUBCUTANEOUS
  Administered 2024-02-12: 8 [IU] via SUBCUTANEOUS

## 2024-02-09 MED ORDER — AMIODARONE LOAD VIA INFUSION
150.0000 mg | Freq: Once | INTRAVENOUS | Status: DC
  Filled 2024-02-09: qty 83.34

## 2024-02-09 MED ORDER — FOLIC ACID 1 MG PO TABS
1.0000 mg | ORAL_TABLET | Freq: Every morning | ORAL | Status: DC
  Administered 2024-02-09 – 2024-02-12 (×4): 1 mg via ORAL
  Filled 2024-02-09 (×4): qty 1

## 2024-02-09 MED ORDER — ROSUVASTATIN CALCIUM 5 MG PO TABS
5.0000 mg | ORAL_TABLET | ORAL | Status: DC
  Administered 2024-02-11: 5 mg via ORAL
  Filled 2024-02-09: qty 1

## 2024-02-09 MED ORDER — AMIODARONE HCL IN DEXTROSE 360-4.14 MG/200ML-% IV SOLN: 60.0000 mg/h | INTRAVENOUS | Status: DC

## 2024-02-09 MED ORDER — MAGNESIUM SULFATE 2 GM/50ML IV SOLN
2.0000 g | Freq: Once | INTRAVENOUS | Status: AC
  Administered 2024-02-09: 2 g via INTRAVENOUS
  Filled 2024-02-09: qty 50

## 2024-02-09 MED ORDER — LACTATED RINGERS IV SOLN: INTRAVENOUS | Status: DC

## 2024-02-09 MED ORDER — APIXABAN 5 MG PO TABS
5.0000 mg | ORAL_TABLET | Freq: Two times a day (BID) | ORAL | Status: DC
  Administered 2024-02-09 – 2024-02-12 (×7): 5 mg via ORAL
  Filled 2024-02-09 (×7): qty 1

## 2024-02-09 MED ORDER — ENOXAPARIN SODIUM 30 MG/0.3ML IJ SOSY: 30.0000 mg | PREFILLED_SYRINGE | INTRAMUSCULAR | Status: DC

## 2024-02-09 MED ORDER — SODIUM CHLORIDE 0.9 % IV BOLUS (SEPSIS)
1000.0000 mL | Freq: Once | INTRAVENOUS | Status: AC
  Administered 2024-02-09: 1000 mL via INTRAVENOUS

## 2024-02-09 MED ORDER — DILTIAZEM HCL 25 MG/5ML IV SOLN: 10.0000 mg | Freq: Four times a day (QID) | INTRAVENOUS | Status: DC | PRN

## 2024-02-09 MED ORDER — ACETAMINOPHEN 500 MG PO TABS
500.0000 mg | ORAL_TABLET | Freq: Four times a day (QID) | ORAL | Status: DC | PRN
  Administered 2024-02-10 – 2024-02-12 (×3): 1000 mg via ORAL
  Filled 2024-02-09 (×3): qty 2

## 2024-02-09 MED ORDER — METOPROLOL TARTRATE 5 MG/5ML IV SOLN
5.0000 mg | Freq: Once | INTRAVENOUS | Status: AC
  Administered 2024-02-09: 5 mg via INTRAVENOUS
  Filled 2024-02-09: qty 5

## 2024-02-09 NOTE — Plan of Care (Signed)

## 2024-02-09 NOTE — Evaluation (Signed)
 Physical Therapy Evaluation Patient Details Name: Samantha [name] MRN: [id number] DOB: 07/06/1945 Today's Date: 02/09/2024  History of Present Illness  The pt is a 78 yo female presenting 8/29 from PCP with abnormal EKG, feeling poorly for 3 days PTA. Work up revealed DKA with glucose of 843, afib with RVR. 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   Clinical Impression  Pt in bed upon arrival of PT, agreeable to evaluation at this time. Prior to admission the pt was independent with mobility in the home, but reports gradual decline in activity tolerance and strength leading her to be largely in bed for 3 days prior to admission. The pt was able to complete bed mobility and transfers without assist, but does require increased time and UE support for balance in standing. The pt was able to complete ambulation with VSS and CGA to supervision with use of RW, demos increased sway and slowed, guarded gait without UE support at this time. Will benefit from continued skilled PT acutely to progress functional mobility and stability back to pt's baseline independence. Recommend rollator for improved stability with mobility after d/c.   VITALS:  - supine in bed - BP: 91/61 (71); HR: 70bpm - sitting EOB - BP: 96/60 (72); HR: 72bpm - standing - BP: 113/68 (79); HR: 74bpm - standing after ambulation - BP: 113/62 (76); HR: 87bpm    If plan is discharge home, recommend the following: A little help with walking and/or transfers;Assistance with cooking/housework;Direct supervision/assist for medications management;Help with stairs or ramp for entrance   Can travel by private vehicle        Equipment Recommendations Rollator (4 wheels)  Recommendations for Other Services       Functional Status Assessment Patient has had a recent decline in their functional status and demonstrates the ability to make significant  improvements in function in a reasonable and predictable amount of time.     Precautions / Restrictions Precautions Precautions: Fall Recall of Precautions/Restrictions: Intact Restrictions Weight Bearing Restrictions Per Provider Order: No      Mobility  Bed Mobility Overal bed mobility: Needs Assistance Bed Mobility: Supine to Sit     Supine to sit: Modified independent (Device/Increase time)     General bed mobility comments: increased time, use of rails    Transfers Overall transfer level: Needs assistance Equipment used: None Transfers: Sit to/from Stand, Bed to chair/wheelchair/BSC Sit to Stand: Contact guard assist   Step pivot transfers: Contact guard assist       General transfer comment: supervision without DME, slow to rise and increased sway. improved stability with BUE support on RW    Ambulation/Gait Ambulation/Gait assistance: Contact guard assist, Min assist Gait Distance (Feet): 125 Feet Assistive device: Rolling walker (2 wheels), None Gait Pattern/deviations: Step-through pattern, Decreased stride length Gait velocity: 0.36 m/s Gait velocity interpretation: <1.31 ft/sec, indicative of household ambulator   General Gait Details: pt initially ambulating without UE support, slowed speed, increased sway and reaching for furniture to hold. then trialed RW with improved stability and able to be CGA-supervision      Balance Overall balance assessment: Needs assistance Sitting-balance support: No upper extremity supported, Feet supported Sitting balance-Leahy Scale: Good     Standing balance support: No upper extremity supported, During functional activity Standing balance-Leahy Scale: Poor Standing balance comment: minA without UE support during gait, increased sway, slow guarded pace. improved with RW  Pertinent Vitals/Pain Pain Assessment Pain Assessment: No/denies pain    Home Living Family/patient  expects to be discharged to:: Private residence Living Arrangements: Other relatives (grandson) Available Help at Discharge: Family;Available PRN/intermittently;Other (Comment) (grandson works during the day, pt reports no other family assist available) Type of Home: House Home Access: Ramped entrance;Stairs to enter   [employer] of Steps: 1   Home Layout: One level Home Equipment: Cane - single point;Shower seat Additional Comments: Pt lives at home with grandson who works during the day. Recently went to rehab after last hospital stay, function improved but still needed a little help walking/still weak    Prior Function Prior Level of Function : Independent/Modified Independent             Mobility Comments: no use of DME in the home, no falls in last 6 months, mostly in bed last month, typically independent and driving in community. no exercise or activity ADLs Comments: independent, uses shower chair     Extremity/Trunk Assessment   Upper Extremity Assessment Upper Extremity Assessment: Generalized weakness    Lower Extremity Assessment Lower Extremity Assessment: Generalized weakness (grossly 4-/5)    Cervical / Trunk Assessment Cervical / Trunk Assessment: Normal  Communication   Communication Communication: No apparent difficulties    Cognition Arousal: Alert Behavior During Therapy: WFL for tasks assessed/performed, Flat affect   PT - Cognitive impairments: Problem solving, Safety/Judgement                       PT - Cognition Comments: pt answering questions only when asked. some inconsistencies in PLOF and DME when asked repeatedly. able to follow commands well Following commands: Intact       Cueing Cueing Techniques: Verbal cues     General Comments General comments (skin integrity, edema, etc.): BP initially soft, improved with mobility    Exercises     Assessment/Plan    PT Assessment Patient needs continued PT services   PT Problem List Decreased strength;Decreased activity tolerance;Decreased balance;Decreased mobility;Cardiopulmonary status limiting activity       PT Treatment Interventions DME instruction;Gait training;Stair training;Functional mobility training;Therapeutic activities;Therapeutic exercise;Balance training;Patient/family education    PT Goals (Current goals can be found in the Care Plan section)  Acute Rehab PT Goals Patient Stated Goal: return to independence PT Goal Formulation: With patient Time For Goal Achievement: 02/23/24 Potential to Achieve Goals: Good    Frequency Min 2X/week        AM-PAC PT 6 Clicks Mobility  Outcome Measure Help needed turning from your back to your side while in a flat bed without using bedrails?: None Help needed moving from lying on your back to sitting on the side of a flat bed without using bedrails?: A Little Help needed moving to and from a bed to a chair (including a wheelchair)?: A Little Help needed standing up from a chair using your arms (e.g., wheelchair or bedside chair)?: A Little Help needed to walk in hospital room?: A Little Help needed climbing 3-5 steps with a railing? : A Lot 6 Click Score: 18    End of Session Equipment Utilized During Treatment: Gait belt Activity Tolerance: Patient tolerated treatment well Patient left: in chair;with call bell/phone within reach;with chair alarm set;with nursing/sitter in room Nurse Communication: Mobility status PT Visit Diagnosis: Unsteadiness on feet (R26.81);Muscle weakness (generalized) (M62.81)    Time: 9154-9084 PT Time Calculation (min) (ACUTE ONLY): 30 min   Charges:   PT Evaluation $PT Eval Low Complexity:  1 Low PT Treatments $Gait Training: 8-22 mins PT General Charges $$ ACUTE PT VISIT: 1 Visit         Izetta Call, PT, DPT   Acute Rehabilitation Department Office [phone] Secure Chat Communication Preferred  Izetta JULIANNA Call 02/09/2024, 9:26 AM

## 2024-02-09 NOTE — Progress Notes (Signed)
 PROGRESS NOTE                                                                                                                                                                                                             Patient Demographics:    Samantha [name], is a 78 y.o. female, DOB - 05/22/1946, FMW:[id number]  Outpatient Primary MD for the patient is Campbell Reynolds, NP    LOS - 0  Admit date - 02/08/2024    Chief Complaint  Patient presents with   Abnormal EKG   Sent from PCP       Brief Narrative (HPI from H&P)   78 y.o. female with medical history significant of hypothyroidism, atrial fibrillation, acute pericarditis, hyperlipidemia, type 2 diabetes, CKD stage III who presented to the hospital with complaint of abnormal EKG.  Patient states that she has been having generalized weakness for the past week.  She presented to her primary care doctor today with this complaint and EKG was performed.  Which I thought was abnormal patient appeared to have inverted T waves in the inferior leads and therefore was sent to the emergency room for further evaluation.    Subjective:    Patient in bed, appears comfortable, denies any headache, no fever, no chest pain or pressure, no shortness of breath , no abdominal pain. No new focal weakness.    Assessment  & Plan :   DKA type2 in DKA She was started on insulin  drip per DKA protocol, adequately hydrated, gap is closed, transition to subcu insulin , continue hydration and monitor.  Will get insulin  and diabetic education as well. Lab Results  Component Value Date   HGBA1C 13.1 (H) 02/08/2024   CBG (last 3)  Recent Labs    02/09/24 0713 02/09/24 0758 02/09/24 0813  GLUCAP 172* 176* 156*      Paroxysmal atrial fibrillation with rvr advised to score of greater than 3 Went into RVR likely due to #1 above, placed on Cardizem  drip briefly, continue oral Lopressor , hydrate, rate  improving, continue Eliquis  for anticoagulation.  Check TSH and free T4, recent echo noted which is stable.  She was also on methimazole  which she had recently stopped which could have attributed to RVR.   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   GERD Will continue  protonix    Hypertension continue metoprolol  and hydralazine  for now   Hyperthyroidism - resume methimazole  which she had stopped recently.  Check TSH and free T4.        Condition - fair  Family Communication  : Family member bedside sleeping on 02/09/2024  Code Status : Full code  Consults  :  None  PUD Prophylaxis : PPI   Procedures  :            Disposition Plan  :    Status is: Inpatient  DVT Prophylaxis  :     apixaban  (ELIQUIS ) tablet 5 mg     Lab Results  Component Value Date   PLT 308 02/09/2024    Diet :  Diet Order             Diet Carb Modified Fluid consistency: Thin; Room service appropriate? Yes  Diet effective now                    Inpatient Medications  Scheduled Meds:  apixaban   5 mg Oral BID   colchicine   0.6 mg Oral Daily   diltiazem   10 mg Intravenous Once   ferrous sulfate   325 mg Oral Daily   folic acid   1 mg Oral q morning   hydrALAZINE   50 mg Oral Q8H   insulin  aspart  0-15 Units Subcutaneous TID WC   insulin  aspart  0-5 Units Subcutaneous QHS   insulin  glargine  15 Units Subcutaneous Daily   methIMAzole   10 mg Oral BID   metoprolol  tartrate  100 mg Oral BID   pantoprazole   40 mg Oral Daily   [START ON 02/11/2024] rosuvastatin   5 mg Oral Q M,W,F   Continuous Infusions:  diltiazem  (CARDIZEM ) infusion 5 mg/hr (02/09/24 0723)   lactated ringers      PRN Meds:.acetaminophen , dextrose , diltiazem   Antibiotics  :    Anti-infectives (From admission, onward)    None         Objective:   Vitals:    02/09/24 0300 02/09/24 0635 02/09/24 0724 02/09/24 0759  BP: 100/72 106/74  97/62  Pulse: 73 (!) 145 65 63  Resp: 16  16 19   Temp: 98.8 F (37.1 C) 98.7 F (37.1 C)  98.3 F (36.8 C)  TempSrc: Oral   Oral  SpO2: 100%  100% 100%  Weight:      Height:        Wt Readings from Last 3 Encounters:  02/09/24 65.9 kg  01/17/24 66.7 kg  12/19/23 69 kg    No intake or output data in the 24 hours ending 02/09/24 0810   Physical Exam  Awake Alert, No new F.N deficits, Normal affect [place].AT,PERRAL Supple Neck, No JVD,   Symmetrical Chest wall movement, Good air movement bilaterally, CTAB iRRR,No Gallops,Rubs or new Murmurs,  +ve B.Sounds, Abd Soft, No tenderness,   No Cyanosis, Clubbing or edema        Data Review:    Recent Labs  Lab 02/08/24 2017 02/08/24 2352 02/09/24 0223 02/09/24 0700  WBC 9.7  --  8.7 10.2  HGB 12.0 12.9 10.6* 10.9*  HCT 39.8 38.0 35.3* 36.5  PLT 395  --  302 308  MCV 77.9*  --  78.4* 78.3*  MCH 23.5*  --  23.6* 23.4*  MCHC 30.2  --  30.0 29.9*  RDW 19.9*  --  19.6* 19.7*  LYMPHSABS  --   --   --  3.3  MONOABS  --   --   --  1.1*  EOSABS  --   --   --  0.1  BASOSABS  --   --   --  0.1    Recent Labs  Lab 02/08/24 2017 02/08/24 2352 02/09/24 0223  NA 123* 122* 129*  K 4.8 5.0 3.7  CL 88*  --  97*  CO2 19*  --  17*  ANIONGAP 16*  --  15  GLUCOSE 843*  --  472*  BUN 46*  --  44*  CREATININE 2.30*  --  2.03*  HGBA1C 13.1*  --   --   CALCIUM  9.3  --  8.8*      Recent Labs  Lab 02/08/24 2017 02/09/24 0223  HGBA1C 13.1*  --   CALCIUM  9.3 8.8*    --------------------------------------------------------------------------------------------------------------- Lab Results  Component Value Date   CHOL 283 (H) 01/15/2024   HDL 128 01/15/2024   LDLCALC 144 (H) 01/15/2024   TRIG 74 01/15/2024   CHOLHDL 2.2 01/15/2024    Lab Results  Component Value Date   HGBA1C 13.1 (H) 02/08/2024   No results for input(s): TSH, T4TOTAL,  FREET4, T3FREE, THYROIDAB in the last 72 hours. No results for input(s): VITAMINB12, FOLATE, FERRITIN, TIBC, IRON, RETICCTPCT in the last 72 hours.    Radiology Report DG Chest 2 View Result Date: 02/08/2024 CLINICAL DATA:  Abnormal EKG fatigue EXAM: CHEST - 2 VIEW COMPARISON:  Chest CT and x-ray 12/07/2023 FINDINGS: The heart size and mediastinal contours are within normal limits. Both lungs are clear. Multilevel degenerative osteophytes. Atelectasis or scarring at the bases. IMPRESSION: No active cardiopulmonary disease. Electronically Signed   By: Luke Bun M.D.   On: 02/08/2024 20:43     Signature  -   Lavada Stank M.D on 02/09/2024 at 8:10 AM   -  To page go to [email or web address]

## 2024-02-09 NOTE — Inpatient Diabetes Management (Addendum)
 Inpatient Diabetes Program Recommendations  AACE/ADA: New Consensus Statement on Inpatient Glycemic Control (2015)  Target Ranges:  Prepandial:   less than 140 mg/dL      Peak postprandial:   less than 180 mg/dL (1-2 hours)      Critically ill patients:  140 - 180 mg/dL    Latest Reference Range & Units 12/08/23 02:14 02/08/24 20:17  Hemoglobin A1C 4.8 - 5.6 % 6.6 (H) 13.1 (H)  329 mg/dl  (H): Data is abnormally high  Latest Reference Range & Units 02/08/24 20:17  Glucose 70 - 99 mg/dL 156 (HH)  (HH): Data is critically high  Latest Reference Range & Units 02/08/24 23:36 02/09/24 00:53 02/09/24 01:33 02/09/24 02:08 02/09/24 02:44 02/09/24 03:27  Glucose-Capillary 70 - 99 mg/dL >399 (HH)  IV Insulin  Drip Started >600 (HH) >600 (HH) 460 (H) 426 (H) 342 (H)    Latest Reference Range & Units 02/09/24 04:02 02/09/24 05:03 02/09/24 06:16 02/09/24 07:13 02/09/24 07:58 02/09/24 08:13  Glucose-Capillary 70 - 99 mg/dL 661 (H) 761 (H) 815 (H)  15 units Lantus  @0657  172 (H) 176 (H) 156 (H)  IV Insulin  Drip Stopped  (H): Data is abnormally high   Admit with: DKA/ Paroxysmal atrial fibrillation with RVR  History: DM2, CKD, Hyperthyroidism  Home DM Meds: Metformin  500 mg BID  Current Orders: IV Insulin  Drip     Transitioning to Novolog  Moderate Correction Scale/ SSI (0-15 units) TID AC + HS + Lantus  15 units daily    ENDO: Samantha [name] in [place] Last seen 01/17/2024 Has been seeing Samantha [name] for thyroid  issues May be able to start seeing for further diabetes management   Note transition to SQ Insulin  from IV Insulin  this AM Given markedly high A1c, likely needs to start Insulin  at home? Per Chart Review, Was given Prednisone  taper by Cardiology 12/20/2023 (See Samantha [name] notes from 12/20/2023)--was told to start Prednisone  30 mg daily for a week with eventual taper down to 5 mg daily.   I also talked with pt about the possibility that she may need insulin  at home given her A1c.   Pt was adamant that she does NOT want to take insulin  at home.  I asked her if the RN could show her to inject at 12pm today, but pt stated No No No, I'm not ready for that and I'm not ready to take insulin  at home.    Addendum 10:15am--Called pt by phone after she was done working with PT.  This Diabetes Coordinator is not on [facility] campus currently (covering all campuses for the weekend).  Pt told me she was given a Prednisone  taper about 2+ weeks ago for her breathing.  Could not tell me her doses of Prednisone  but stated she started taking 5 pills once a day and then went to 4 pills once a day, etc, etc.  Per Chart Review, Was given Prednisone  taper by Cardiology 12/20/2023 (See Samantha [name] notes from 12/20/2023)--was told to start Prednisone  30 mg daily for a week with eventual taper down to 5 mg daily.  I reviewed pt's current A1c of 13.1% with her and she was shocked to hear it was so high.  Her last A1c was 6.6%.  We talked about how the Prednisone  likely was the culprit behind her extremely elevated CBGs.  Pt told me she has not been checking her CBGs at home b/c she thinks the lancet device in her meter doesn't stick her finger deep enough.  She recently bought this meter OTC at  [facility].  I asked pt to take her meter to the pharmacy at [facility] and have the pharmacy staff show her how to better use the lancet at home.  I also talked with pt about the possibility that she may need insulin  at home given her A1c.  Pt was adamant that she does NOT want to take insulin  at home.  I asked her if the RN could show her to inject at 12pm today, but pt stated No No No, I'm not ready for that and I'm not ready to take insulin  at home.  I thanked pt for talking with me and told her I would call back tomorrow to check in her and pt was agreeable to this.  I also called RN Verdie and relayed the info I retrieved from my conversation with the pt.   --Will follow patient during hospitalization--  Samantha Rudolpho Arrow  RN, MSN, CDCES Diabetes Coordinator Inpatient Glycemic Control Team Team Pager: [phone] (8a-5p)

## 2024-02-09 NOTE — Plan of Care (Signed)
°  Problem: Education: °Goal: Knowledge of General Education information will improve °Description: Including pain rating scale, medication(s)/side effects and non-pharmacologic comfort measures °Outcome: Progressing °  °Problem: Activity: °Goal: Risk for activity intolerance will decrease °Outcome: Progressing °  °Problem: Elimination: °Goal: Will not experience complications related to urinary retention °Outcome: Progressing °  °

## 2024-02-09 NOTE — Progress Notes (Signed)
   02/09/24 1459  Mobility  Activity Ambulated with assistance  Level of Assistance Contact guard assist, steadying assist  Assistive Device Front wheel walker  Distance Ambulated (ft) 300 ft  Activity Response Tolerated well  Mobility Referral Yes  Mobility visit 1 Mobility  Mobility Specialist Start Time (ACUTE ONLY) 1459  Mobility Specialist Stop Time (ACUTE ONLY) 1519  Mobility Specialist Time Calculation (min) (ACUTE ONLY) 20 min   Mobility Specialist: Progress Note  Pre-Mobility:      HR 85, SpO2 99% RA Post-Mobility:    HR 88, SpO2 100% RA  Pt agreeable to mobility session - received in bed. Pt was asymptomatic throughout session with no complaints. Returned to chair with all needs met - call bell within reach. Chair alarm on. Ambulated to BR, soft BM, pericare completed ind.   Samantha [name], BS Mobility Specialist Please contact via SecureChat or  Rehab office at [phone].

## 2024-02-09 NOTE — ED Notes (Signed)
 CCMD alerted this nurse that patient's heart rate rose to the 140s. This nurse alerted admitting attending at the bedside and shot another EKG. Admitting attending at the bedside currently.

## 2024-02-09 NOTE — ED Notes (Signed)
 This nurse called main pharmacy to ask about the compatibility of insulin  and amiodarone . The pharmacist stated that they could be run together.

## 2024-02-09 NOTE — ED Notes (Signed)
 This nurse alerted attending Lorren, MD to patient's rise in heart rate to 130s-140s and decrease to the 70s.

## 2024-02-09 NOTE — H&P (Addendum)
 History and Physical    Patient: Samantha [name] FMW:[id number] DOB: 03/25/1946 DOA: 02/08/2024 DOS: the patient was seen and examined on 02/09/2024 PCP: Campbell Reynolds, NP  Patient coming from: Home  Chief Complaint:  Chief Complaint  Patient presents with   Abnormal EKG   Sent from PCP   HPI: Samantha [name] is a 78 y.o. female with medical history significant of hypothyroidism, atrial fibrillation, acute pericarditis, hyperlipidemia, type 2 diabetes, CKD stage III who presented to the hospital with complaint of abnormal EKG.  Patient states that she has been having generalized weakness for the past week.  She presented to her primary care doctor today with this complaint and EKG was performed.  Which I thought was abnormal patient appeared to have inverted T waves in the inferior leads and therefore was sent to the emergency room for further evaluation.  The patient herself denied any chest pain while she was in the provider office.  And states that in fact her pericarditis had started to improve.  In the ER, patient was initially noted to have a blood pressure of 102/54, heart rate of 81, respiratory 16, temperature was 98.7 and she was saturating 99% on room air.  Her laboratory evaluation determined that patient had sodium level 123, potassium 4.8, chloride 88, bicarb is 19, glucose of 843, BUN/creatinine 46/2.30 and anion gap of 16.  Patient also demonstrated beta hydroxybutyrate of 2.22.  CBC was otherwise unremarkable.  Initial Trope was 37 and secondary was 32.  Hemoglobin A1c was noted to be 13.1%.  X-ray demonstrated no acute cardiopulmonary disease.  Patient had a repeat EKG which demonstrated similar T wave inversions in the inferior leads otherwise normal sinus rhythm.  At bedside, patient was feeling fluids insulin  infusion and started to experience  palpitations. Appeared that patient had transitioned into atrial fibrillation with rvr.  Review of Systems: As mentioned in the history of  present illness. All other systems reviewed and are negative. Past Medical History:  Diagnosis Date   Allergic rhinitis    Arthritis of both knees    Arthritis of both shoulder regions    Bilateral carpal tunnel syndrome    Cataract    Chronic low back pain    CKD (chronic kidney disease), stage III (HCC)    Decreased vision    Diabetes mellitus    HTN (hypertension)    Hyperlipidemia    Morbid obesity (HCC)    Thyroid  disease    Past Surgical History:  Procedure Laterality Date   CATARACT EXTRACTION     COLONOSCOPY     COLONOSCOPY N/A 09/15/2020   Procedure: COLONOSCOPY;  Surgeon: Shaaron Lamar HERO, MD;  Location: [facility];  Service: Endoscopy;  Laterality: N/A;  ASA II / PM procedure   POLYPECTOMY  09/15/2020   Procedure: POLYPECTOMY;  Surgeon: Shaaron Lamar HERO, MD;  Location: [facility];  Service: Endoscopy;;   TUBAL LIGATION     Social History:  reports that she has never smoked. She has never used smokeless tobacco. She reports current alcohol use. She reports that she does not use drugs.  No Known Allergies  Family History  Problem Relation Age of Onset   Hypertension Father    Hypertension Mother    Diabetes Mother    Breast cancer Mother    Hypertension Sister    Cancer Sister        pancreatic   Cancer Sister        pancreatic   Liver disease Sister  Prior to Admission medications   Medication Sig Start Date End Date Taking? Authorizing Provider  acetaminophen  (TYLENOL ) 500 MG tablet Take 500-1,000 mg by mouth every 6 (six) hours as needed for mild pain.   Yes [provider]  apixaban  (ELIQUIS ) 5 MG TABS tablet Take 1 tablet (5 mg total) by mouth 2 (two) times daily. 12/19/23  Yes Vicci Rollo SAUNDERS, PA-C  colchicine  0.6 MG tablet Take 1 tablet (0.6 mg total) by mouth daily. 12/19/23  Yes Vicci Rollo SAUNDERS, PA-C  ferrous sulfate  325 (65 FE) MG tablet Take 325 mg by mouth daily. 09/04/23  Yes [provider]  folic acid  (FOLVITE ) 1 MG  tablet Take 1 mg by mouth every morning. 06/01/23  Yes [provider]  hydrALAZINE  (APRESOLINE ) 100 MG tablet Take 100 mg by mouth 2 (two) times daily. 06/29/23  Yes [provider]  metFORMIN  (GLUCOPHAGE -XR) 500 MG 24 hr tablet Take 1 tablet (500 mg total) by mouth 2 (two) times daily with a meal. 03/12/23  Yes Arellano Zameza, Priscila, MD  metoprolol  tartrate (LOPRESSOR ) 100 MG tablet Take 100 mg by mouth 2 (two) times daily. 06/21/23  Yes [provider]  pantoprazole  (PROTONIX ) 40 MG tablet Take 1 tablet (40 mg total) by mouth daily. 12/11/23  Yes Darci Pore, MD  rosuvastatin  (CRESTOR ) 5 MG tablet Take 5 mg by mouth every Monday, Wednesday, and Friday. 11/12/23  Yes [provider]  spironolactone  (ALDACTONE ) 25 MG tablet Take 1 tablet (25 mg total) by mouth daily. 01/01/24  Yes Vicci Rollo SAUNDERS, PA-C  valsartan -hydrochlorothiazide (DIOVAN -HCT) 320-12.5 MG tablet Take 1 tablet by mouth every morning. 01/11/24  Yes [provider]  methimazole  (TAPAZOLE ) 10 MG tablet Take 1 tablet (10 mg total) by mouth daily with breakfast. Patient not taking: Reported on 02/08/2024 09/14/23   Lenis Ethelle ORN, MD    Physical Exam: Vitals:   02/08/24 1907 02/08/24 2230 02/08/24 2346 02/09/24 0100  BP: (!) 102/54 (!) 122/56    Pulse: 81 77    Resp: 16 14    Temp: 98.7 F (37.1 C)  98.9 F (37.2 C)   SpO2: 99% 100%    Weight:    65.9 kg  Height:    5' 1 (1.549 m)  Physical Exam HENT:     Head: Normocephalic.  Eyes:     Pupils: Pupils are equal, round, and reactive to light.  Cardiovascular:     Rate and Rhythm: Rhythm irregular.  Pulmonary:     Effort: Pulmonary effort is normal.  Abdominal:     General: Abdomen is flat.     Palpations: Abdomen is soft.  Skin:    General: Skin is warm and dry.  Neurological:     Mental Status: She is alert and oriented to person, place, and time.  Psychiatric:        Mood and Affect: Mood normal.       Data Reviewed:  The results are discussed above  Assessment and Plan: No notes have been filed under this hospital service. Service: Hospitalist  DKA type2 not at goal Will start on insulin  drip at this time Will also start on normal saline at this time, continue until glucose is less than 250 Will transition to D5 half normal saline  Will discontinue insulin  drip when anion gap has closed and transition to SQ insulin  Will repeat bmp every 4 hours   Atrial fibrillation with rvr Patient was given an initial dose of lopressor  5 mg IV and then  cardizem  10 mg IV She had subsequent improvement in her heart rate She is normally on metoprolol  at home twice a day  This will be continued  Hyponatremia Na is 122 on admission This is likely secondary to #1 Will continue to monitor     Acute on CKD stage 3 Cr is 2.30 Patient has been started on IV normal saline at this time Will continue to monitor   HLD Will continue zetia   GERD Will continue  protonix   Hypertension  Will continue metoprolol  100 mg bID Will hold off on diovan    Hyperthyroidism Continue methimazole    Advance Care Planning:   Code Status: Full Code   Consults:  cardiology  Family Communication: Daughter at bedside  Severity of Illness: The appropriate patient status for this patient is INPATIENT. Inpatient status is judged to be reasonable and necessary in order to provide the required intensity of service to ensure the patient's safety. The patient's presenting symptoms, physical exam findings, and initial radiographic and laboratory data in the context of their chronic comorbidities is felt to place them at high risk for further clinical deterioration. Furthermore, it is not anticipated that the patient will be medically stable for discharge from the hospital within 2 midnights of admission.   * I certify that at the point of admission it is my clinical judgment that the patient will require inpatient  hospital care spanning beyond 2 midnights from the point of admission due to high intensity of service, high risk for further deterioration and high frequency of surveillance required.*  Author: Zanylah Hardie K Emmarae Cowdery, MD 02/09/2024 1:47 AM  For on call review [email or web address].

## 2024-02-10 DIAGNOSIS — E1110 Type 2 diabetes mellitus with ketoacidosis without coma: Secondary | ICD-10-CM | POA: Diagnosis not present

## 2024-02-10 LAB — CBC WITH DIFFERENTIAL/PLATELET
Abs Immature Granulocytes: 0.14 K/uL — ABNORMAL HIGH (ref 0.00–0.07)
Basophils Absolute: 0 K/uL (ref 0.0–0.1)
Basophils Relative: 0 %
Eosinophils Absolute: 0 K/uL (ref 0.0–0.5)
Eosinophils Relative: 0 %
HCT: 32.6 % — ABNORMAL LOW (ref 36.0–46.0)
Hemoglobin: 10.1 g/dL — ABNORMAL LOW (ref 12.0–15.0)
Immature Granulocytes: 2 %
Lymphocytes Relative: 28 %
Lymphs Abs: 2 K/uL (ref 0.7–4.0)
MCH: 23.7 pg — ABNORMAL LOW (ref 26.0–34.0)
MCHC: 31 g/dL (ref 30.0–36.0)
MCV: 76.3 fL — ABNORMAL LOW (ref 80.0–100.0)
Monocytes Absolute: 0.7 K/uL (ref 0.1–1.0)
Monocytes Relative: 10 %
Neutro Abs: 4.4 K/uL (ref 1.7–7.7)
Neutrophils Relative %: 60 %
Platelets: 302 K/uL (ref 150–400)
RBC: 4.27 MIL/uL (ref 3.87–5.11)
RDW: 19.8 % — ABNORMAL HIGH (ref 11.5–15.5)
WBC: 7.4 K/uL (ref 4.0–10.5)
nRBC: 0 % (ref 0.0–0.2)

## 2024-02-10 LAB — COMPREHENSIVE METABOLIC PANEL WITH GFR
ALT: 10 U/L (ref 0–44)
AST: 14 U/L — ABNORMAL LOW (ref 15–41)
Albumin: 2.3 g/dL — ABNORMAL LOW (ref 3.5–5.0)
Alkaline Phosphatase: 74 U/L (ref 38–126)
Anion gap: 11 (ref 5–15)
BUN: 25 mg/dL — ABNORMAL HIGH (ref 8–23)
CO2: 21 mmol/L — ABNORMAL LOW (ref 22–32)
Calcium: 9.2 mg/dL (ref 8.9–10.3)
Chloride: 104 mmol/L (ref 98–111)
Creatinine, Ser: 1.26 mg/dL — ABNORMAL HIGH (ref 0.44–1.00)
GFR, Estimated: 44 mL/min — ABNORMAL LOW (ref >60)
Glucose, Bld: 264 mg/dL — ABNORMAL HIGH (ref 70–99)
Potassium: 3.9 mmol/L (ref 3.5–5.1)
Sodium: 136 mmol/L (ref 135–145)
Total Bilirubin: 0.7 mg/dL (ref 0.0–1.2)
Total Protein: 5.4 g/dL — ABNORMAL LOW (ref 6.5–8.1)

## 2024-02-10 LAB — GLUCOSE, CAPILLARY
Glucose-Capillary: 270 mg/dL — ABNORMAL HIGH (ref 70–99)
Glucose-Capillary: 356 mg/dL — ABNORMAL HIGH (ref 70–99)
Glucose-Capillary: 97 mg/dL (ref 70–99)
Glucose-Capillary: 245 mg/dL — ABNORMAL HIGH (ref 70–99)

## 2024-02-10 LAB — PHOSPHORUS: Phosphorus: 2.8 mg/dL (ref 2.5–4.6)

## 2024-02-10 LAB — MAGNESIUM: Magnesium: 1.8 mg/dL (ref 1.7–2.4)

## 2024-02-10 MED ORDER — MAGNESIUM SULFATE IN D5W 1-5 GM/100ML-% IV SOLN
1.0000 g | Freq: Once | INTRAVENOUS | Status: AC
  Administered 2024-02-10: 1 g via INTRAVENOUS
  Filled 2024-02-10: qty 100

## 2024-02-10 MED ORDER — INSULIN ASPART 100 UNIT/ML IJ SOLN
3.0000 [IU] | Freq: Three times a day (TID) | INTRAMUSCULAR | Status: DC
  Administered 2024-02-10 – 2024-02-12 (×5): 3 [IU] via SUBCUTANEOUS

## 2024-02-10 MED ORDER — INSULIN GLARGINE 100 UNIT/ML ~~LOC~~ SOLN
25.0000 [IU] | Freq: Every day | SUBCUTANEOUS | Status: DC
  Administered 2024-02-10 – 2024-02-11 (×2): 25 [IU] via SUBCUTANEOUS
  Filled 2024-02-10 (×2): qty 0.25

## 2024-02-10 NOTE — Plan of Care (Signed)

## 2024-02-10 NOTE — Progress Notes (Signed)
 PROGRESS NOTE                                                                                                                                                                                                             Patient Demographics:    Samantha [name], is a 78 y.o. female, DOB - [birth year], FMW:[id number]  Outpatient Primary MD for the patient is Campbell Reynolds, NP    LOS - 1  Admit date - 02/08/2024    Chief Complaint  Patient presents with   Abnormal EKG   Sent from PCP       Brief Narrative (HPI from H&P)   78 y.o. female with medical history significant of hypothyroidism, atrial fibrillation, acute pericarditis, hyperlipidemia, type 2 diabetes, CKD stage III who presented to the hospital with complaint of abnormal EKG.  Patient states that she has been having generalized weakness for the past week.  She presented to her primary care doctor today with this complaint and EKG was performed.  Which I thought was abnormal patient appeared to have inverted T waves in the inferior leads and therefore was sent to the emergency room for further evaluation.    Subjective:   Patient in bed, appears comfortable, denies any headache, no fever, no chest pain or pressure, no shortness of breath , no abdominal pain. No focal weakness.   Assessment  & Plan :   DKA type2 in DKA She was started on insulin  drip per DKA protocol, adequately hydrated, gap is closed, transition to subcu insulin , dose adjusted further on 02/10/2024, continue hydration and monitor.  Will get insulin  and diabetic education as well.  Lab Results  Component Value Date   HGBA1C 13.1 (H) 02/08/2024   CBG (last 3)  Recent Labs    02/09/24 1628 02/09/24 2128 02/10/24 0816  GLUCAP 390* 294* 270*     Paroxysmal atrial fibrillation with rvr advised to score of greater than 3 Went into RVR likely due to #1 above, placed on Cardizem  drip briefly, continue oral  Lopressor , hydrate, rate improving, continue Eliquis  for anticoagulation.  TSH is suppressed and very high free T4, recent echo noted which is stable.  She was also on methimazole  which she had recently stopped which could have attributed to RVR.   Dehydration, AKI on CKD stage IIIb with hyponatremia Hydrate and monitor avoid nephrotoxins  for now, baseline creatinine around 1.3   HLD Will continue zetia , add statin as she is diabetic.   GERD Will continue  protonix    Hypertension continue metoprolol  and hydralazine  for now   Hyperthyroidism - resume methimazole  which she had stopped recently, she had suppressed TSH with high free T4 in the setting of A-fib RVR.        Condition - fair  Family Communication  : Family member bedside sleeping on 02/09/2024  Code Status : Full code  Consults  :  None  PUD Prophylaxis : PPI   Procedures  :            Disposition Plan  :    Status is: Inpatient  DVT Prophylaxis  :     apixaban  (ELIQUIS ) tablet 5 mg     Lab Results  Component Value Date   PLT 302 02/10/2024    Diet :  Diet Order             Diet Carb Modified Fluid consistency: Thin; Room service appropriate? Yes  Diet effective now                    Inpatient Medications  Scheduled Meds:  apixaban   5 mg Oral BID   colchicine   0.6 mg Oral Daily   ferrous sulfate   325 mg Oral Daily   folic acid   1 mg Oral q morning   hydrALAZINE   50 mg Oral Q8H   insulin  aspart  0-15 Units Subcutaneous TID WC   insulin  aspart  0-5 Units Subcutaneous QHS   insulin  glargine  25 Units Subcutaneous Daily   methIMAzole   10 mg Oral BID   metoprolol  tartrate  100 mg Oral BID   pantoprazole   40 mg Oral Daily   [START ON 02/11/2024] rosuvastatin   5 mg Oral Q M,W,F   Continuous Infusions:   PRN Meds:.acetaminophen , dextrose , diltiazem   Antibiotics  :    Anti-infectives (From admission, onward)    None         Objective:   Vitals:   02/09/24 1929 02/10/24  0000 02/10/24 0400 02/10/24 0817  BP: 108/71 137/74 117/68 (!) 123/109  Pulse:  78 73 79  Resp: 15 15 16    Temp: 97.8 F (36.6 C) 98.1 F (36.7 C) 97.9 F (36.6 C) 98.6 F (37 C)  TempSrc: Oral Oral Oral Oral  SpO2: 98% 97% 99% 98%  Weight:      Height:        Wt Readings from Last 3 Encounters:  02/09/24 65.9 kg  01/17/24 66.7 kg  12/19/23 69 kg     Intake/Output Summary (Last 24 hours) at 02/10/2024 0933 Last data filed at 02/10/2024 9374 Gross per 24 hour  Intake 2088.71 ml  Output --  Net 2088.71 ml     Physical Exam  Awake Alert, No new F.N deficits, Normal affect [place].AT,PERRAL Supple Neck, No JVD,   Symmetrical Chest wall movement, Good air movement bilaterally, CTAB iRRR,No Gallops,Rubs or new Murmurs,  +ve B.Sounds, Abd Soft, No tenderness,   No Cyanosis, Clubbing or edema        Data Review:    Recent Labs  Lab 02/08/24 2017 02/08/24 2352 02/09/24 0223 02/09/24 0700 02/10/24 0742  WBC 9.7  --  8.7 10.2 7.4  HGB 12.0 12.9 10.6* 10.9* 10.1*  HCT 39.8 38.0 35.3* 36.5 32.6*  PLT 395  --  302 308 302  MCV 77.9*  --  78.4* 78.3* 76.3*  MCH 23.5*  --  23.6* 23.4* 23.7*  MCHC 30.2  --  30.0 29.9* 31.0  RDW 19.9*  --  19.6* 19.7* 19.8*  LYMPHSABS  --   --   --  3.3 2.0  MONOABS  --   --   --  1.1* 0.7  EOSABS  --   --   --  0.1 0.0  BASOSABS  --   --   --  0.1 0.0    Recent Labs  Lab 02/08/24 2017 02/08/24 2352 02/09/24 0223 02/09/24 0700 02/09/24 1136  NA 123* 122* 129*  --  133*  K 4.8 5.0 3.7  --  3.9  CL 88*  --  97*  --  101  CO2 19*  --  17*  --  18*  ANIONGAP 16*  --  15  --  14  GLUCOSE 843*  --  472*  --  334*  BUN 46*  --  44*  --  39*  CREATININE 2.30*  --  2.03*  --  1.72*  TSH  --   --   --  <0.100*  --   HGBA1C 13.1*  --   --   --   --   MG  --   --   --  1.7  --   PHOS  --   --   --  2.2*  --   CALCIUM  9.3  --  8.8*  --  8.9      Recent Labs  Lab 02/08/24 2017 02/09/24 0223 02/09/24 0700 02/09/24 1136  TSH  --    --  <0.100*  --   HGBA1C 13.1*  --   --   --   MG  --   --  1.7  --   CALCIUM  9.3 8.8*  --  8.9    --------------------------------------------------------------------------------------------------------------- Lab Results  Component Value Date   CHOL 283 (H) 01/15/2024   HDL 128 01/15/2024   LDLCALC 144 (H) 01/15/2024   TRIG 74 01/15/2024   CHOLHDL 2.2 01/15/2024    Lab Results  Component Value Date   HGBA1C 13.1 (H) 02/08/2024   Recent Labs    02/09/24 0700  TSH <0.100*  FREET4 2.68*   No results for input(s): VITAMINB12, FOLATE, FERRITIN, TIBC, IRON, RETICCTPCT in the last 72 hours.    Radiology Report DG Chest 2 View Result Date: 02/08/2024 CLINICAL DATA:  Abnormal EKG fatigue EXAM: CHEST - 2 VIEW COMPARISON:  Chest CT and x-ray 12/07/2023 FINDINGS: The heart size and mediastinal contours are within normal limits. Both lungs are clear. Multilevel degenerative osteophytes. Atelectasis or scarring at the bases. IMPRESSION: No active cardiopulmonary disease. Electronically Signed   By: Luke Bun M.D.   On: 02/08/2024 20:43     Signature  -   Lavada Stank M.D on 02/10/2024 at 9:33 AM   -  To page go to [email or web address]

## 2024-02-11 DIAGNOSIS — E1110 Type 2 diabetes mellitus with ketoacidosis without coma: Secondary | ICD-10-CM | POA: Diagnosis not present

## 2024-02-11 LAB — GLUCOSE, CAPILLARY
Glucose-Capillary: 234 mg/dL — ABNORMAL HIGH (ref 70–99)
Glucose-Capillary: 246 mg/dL — ABNORMAL HIGH (ref 70–99)
Glucose-Capillary: 383 mg/dL — ABNORMAL HIGH (ref 70–99)
Glucose-Capillary: 103 mg/dL — ABNORMAL HIGH (ref 70–99)

## 2024-02-11 MED ORDER — INSULIN GLARGINE 100 UNIT/ML ~~LOC~~ SOLN
7.0000 [IU] | Freq: Once | SUBCUTANEOUS | Status: AC
  Administered 2024-02-11: 7 [IU] via SUBCUTANEOUS
  Filled 2024-02-11: qty 0.07

## 2024-02-11 MED ORDER — LIVING WELL WITH DIABETES BOOK
Freq: Once | Status: AC
  Filled 2024-02-11: qty 1

## 2024-02-11 MED ORDER — INSULIN STARTER KIT- PEN NEEDLES (ENGLISH)
1.0000 | Freq: Once | Status: AC
  Administered 2024-02-12: 1
  Filled 2024-02-11: qty 1

## 2024-02-11 MED ORDER — INSULIN GLARGINE 100 UNIT/ML ~~LOC~~ SOLN
32.0000 [IU] | Freq: Every day | SUBCUTANEOUS | Status: DC
  Administered 2024-02-12: 32 [IU] via SUBCUTANEOUS
  Filled 2024-02-11: qty 0.32

## 2024-02-11 NOTE — Progress Notes (Signed)
 Physical Therapy Treatment Patient Details Name: Samantha [name] MRN: [id number] DOB: [birth year] Today's Date: 02/11/2024   History of Present Illness The pt is a 78 yo female presenting 8/29 from PCP with abnormal EKG, feeling poorly for 3 days PTA. Work up revealed DKA with glucose of 843, afib with RVR. 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    PT Comments  Pt seen for PT tx with pt agreeable. Pt ambulated without AD with furniture walking, then with rollator with supervision without LOB, decreased gait speed. Pt reports rollator is unlikely to fit in her home but seems open to using it for longer distances. Recommend ongoing PT services to progress balance & gait with LRAD.    If plan is discharge home, recommend the following: A little help with walking and/or transfers;Assistance with cooking/housework;Direct supervision/assist for medications management;Help with stairs or ramp for entrance   Can travel by private vehicle        Equipment Recommendations  Rollator (4 wheels)    Recommendations for Other Services       Precautions / Restrictions Precautions Precautions: Fall Recall of Precautions/Restrictions: Intact Restrictions Weight Bearing Restrictions Per Provider Order: No     Mobility  Bed Mobility               General bed mobility comments: not tested, pt received & left sitting in recliner    Transfers Overall transfer level: Needs assistance Equipment used: None Transfers: Sit to/from Stand Sit to Stand: Supervision                Ambulation/Gait Ambulation/Gait assistance: Supervision Gait Distance (Feet):  (>200 ft) Assistive device: Rollator (4 wheels) Gait Pattern/deviations: Decreased step length - right, Decreased step length - left, Decreased stride length Gait velocity: decreased     General Gait Details: Reviewed rollator brake management but  pt did not practice; would benefit from practicing. Pt initially ambulating in room, reaching for furniture. PT educated pt on benefits of use of AD for reduced fall risk vs furniture walking; pt reporting rollator likely won't fit in her home.   Stairs             Wheelchair Mobility     Tilt Bed    Modified Rankin (Stroke Patients Only)       Balance Overall balance assessment: Needs assistance Sitting-balance support: No upper extremity supported, Feet supported Sitting balance-Leahy Scale: Good     Standing balance support: During functional activity, Bilateral upper extremity supported Standing balance-Leahy Scale: Good                              Communication Communication Communication: No apparent difficulties  Cognition Arousal: Alert Behavior During Therapy: WFL for tasks assessed/performed   PT - Cognitive impairments: No apparent impairments                         Following commands: Intact      Cueing Cueing Techniques: Verbal cues  Exercises Other Exercises Other Exercises: Pt performed 5x sit<>Stand without BUE support with focus on BLE strengthening & endurance training; pt performed activity fairly easily.    General Comments        Pertinent Vitals/Pain Pain Assessment Pain Assessment: No/denies pain    Home Living  Prior Function            PT Goals (current goals can now be found in the care plan section) Acute Rehab PT Goals Patient Stated Goal: return to independence PT Goal Formulation: With patient Time For Goal Achievement: 02/23/24 Potential to Achieve Goals: Good Progress towards PT goals: Progressing toward goals    Frequency    Min 2X/week      PT Plan      Co-evaluation              AM-PAC PT 6 Clicks Mobility   Outcome Measure  Help needed turning from your back to your side while in a flat bed without using bedrails?: None Help  needed moving from lying on your back to sitting on the side of a flat bed without using bedrails?: A Little Help needed moving to and from a bed to a chair (including a wheelchair)?: A Little Help needed standing up from a chair using your arms (e.g., wheelchair or bedside chair)?: A Little Help needed to walk in hospital room?: A Little Help needed climbing 3-5 steps with a railing? : A Little 6 Click Score: 19    End of Session   Activity Tolerance: Patient tolerated treatment well Patient left: in chair;with nursing/sitter in room;with call bell/phone within reach Nurse Communication: Mobility status PT Visit Diagnosis: Unsteadiness on feet (R26.81);Muscle weakness (generalized) (M62.81)     Time: 8745-8693 PT Time Calculation (min) (ACUTE ONLY): 12 min  Charges:    $Therapeutic Activity: 8-22 mins PT General Charges $$ ACUTE PT VISIT: 1 Visit                     Richerd Pinal, PT, DPT 02/11/24, 2:27 PM    Richerd CHRISTELLA Pinal 02/11/2024, 2:26 PM

## 2024-02-11 NOTE — Plan of Care (Signed)

## 2024-02-11 NOTE — Inpatient Diabetes Management (Signed)
 Inpatient Diabetes Program Recommendations  AACE/ADA: New Consensus Statement on Inpatient Glycemic Control (2015)  Target Ranges:  Prepandial:   less than 140 mg/dL      Peak postprandial:   less than 180 mg/dL (1-2 hours)      Critically ill patients:  140 - 180 mg/dL   Lab Results  Component Value Date   GLUCAP 383 (H) 02/11/2024   HGBA1C 13.1 (H) 02/08/2024    Review of Glycemic Control  Diabetes history: DM2 Outpatient Diabetes medications: metformin  500 mg BID Current orders for Inpatient glycemic control: Lantus  32 units daily, Novolog  0-15 TID with meals and 0-5 HS + 3 units TID  HgbA1C - 13.1% CBGs 234, 246, 383   Inpatient Diabetes Program Recommendations:    Spoke with pt at bedside this afternoon regarding her diabetes and HgbA1C of 13.1%. After discussing importance of getting HgbA1C down to < 8%, pt states she is willing to try insulin  at home. Pt was tearful and I took a lot of time to show her how to use insulin  pen. Asked her if she would try giving her insulin  before dinner meal and she said she would try. Pt thought insulin  pen seemed to be fairly simple to use, but continues to be hesitant about giving herself insulin . Ordered Living Well book and insulin  pen starter kit. Was not checking her blood sugars at home d/t lancet not going deep enough to get blood and states she was frustrated. Will order new lancet at discharge. Discussed glucose and HgbA1C goals. Discussed hypoglycemia s/s and treatment. Pt worried about blood sugar going too low. Discussed importance of always having juice, regular soda, glucose gel with her at all times. Always check blood sugar if in doubt or if she feels silly, shaky or sweaty. Needs a lot of support with decision of taking insulin  at home. Also spoke to her about steroids and how they affect blood sugars. Pt to contact Dr Lenis to see if he will manage her diabetes. We discussed CGMs and pt said she may be interested in it. Will ask  Diabetes Coordinator for 9/2 to bring information on CGMs and will ask MD if our team can place CGM on pt prior to discharge. Pt appreciative of visit and seems open to going home on insulin .  F/U in am.  Thank you. Shona Brandy, RD, LDN, CDCES Inpatient Diabetes Coordinator [phone]

## 2024-02-11 NOTE — Progress Notes (Signed)
 PROGRESS NOTE                                                                                                                                                                                                             Patient Demographics:    Samantha [name], is a 78 y.o. female, DOB - July 15, 1945, FMW:[id number]  Outpatient Primary MD for the patient is Campbell Reynolds, NP    LOS - 2  Admit date - 02/08/2024    Chief Complaint  Patient presents with   Abnormal EKG   Sent from PCP       Brief Narrative (HPI from H&P)   78 y.o. female with medical history significant of hypothyroidism, atrial fibrillation, acute pericarditis, hyperlipidemia, type 2 diabetes, CKD stage III who presented to the hospital with complaint of abnormal EKG.  Patient states that she has been having generalized weakness for the past week.  She presented to her primary care doctor today with this complaint and EKG was performed.  Which I thought was abnormal patient appeared to have inverted T waves in the inferior leads and therefore was sent to the emergency room for further evaluation.    Subjective:   Patient in bed, appears comfortable, denies any headache, no fever, no chest pain or pressure, no shortness of breath , no abdominal pain. No new focal weakness.   Assessment  & Plan :   DKA type2 in DKA She was started on insulin  drip per DKA protocol, adequately hydrated, gap is closed, transition to subcu insulin , dose adjusted further on 02/11/2024, has been adequately hydrated.  Will get insulin  and diabetic education as well.  Lab Results  Component Value Date   HGBA1C 13.1 (H) 02/08/2024   CBG (last 3)  Recent Labs    02/10/24 1609 02/10/24 2127 02/11/24 0824  GLUCAP 97 245* 234*     Paroxysmal atrial fibrillation with rvr advised to score of greater than 3 Went into RVR likely due to #1 above, placed on Cardizem  drip briefly, continue oral  Lopressor , hydrate, rate improving, continue Eliquis  for anticoagulation.  TSH is suppressed and very high free T4, recent echo noted which is stable.  She was also on methimazole  which she had recently stopped which could have attributed to RVR.   Dehydration, AKI on CKD stage IIIb with hyponatremia Hydrate and monitor avoid  nephrotoxins for now, baseline creatinine around 1.3   HLD Will continue zetia , add statin as she is diabetic.   GERD Will continue  protonix    Hypertension continue metoprolol  and hydralazine  for now   Hyperthyroidism - resume methimazole  which she had stopped recently, she had suppressed TSH with high free T4 in the setting of A-fib RVR.        Condition - fair  Family Communication  : Family member bedside sleeping on 02/09/2024  Code Status : Full code  Consults  :  None  PUD Prophylaxis : PPI   Procedures  :            Disposition Plan  :    Status is: Inpatient  DVT Prophylaxis  :     apixaban  (ELIQUIS ) tablet 5 mg     Lab Results  Component Value Date   PLT 302 02/10/2024    Diet :  Diet Order             Diet Carb Modified Fluid consistency: Thin; Room service appropriate? Yes  Diet effective now                    Inpatient Medications  Scheduled Meds:  apixaban   5 mg Oral BID   colchicine   0.6 mg Oral Daily   ferrous sulfate   325 mg Oral Daily   folic acid   1 mg Oral q morning   hydrALAZINE   50 mg Oral Q8H   insulin  aspart  0-15 Units Subcutaneous TID WC   insulin  aspart  0-5 Units Subcutaneous QHS   insulin  aspart  3 Units Subcutaneous TID WC   [START ON 02/12/2024] insulin  glargine  32 Units Subcutaneous Daily   insulin  glargine  7 Units Subcutaneous Once   methIMAzole   10 mg Oral BID   metoprolol  tartrate  100 mg Oral BID   pantoprazole   40 mg Oral Daily   rosuvastatin   5 mg Oral Q M,W,F   Continuous Infusions:   PRN Meds:.acetaminophen , dextrose , diltiazem   Antibiotics  :    Anti-infectives (From  admission, onward)    None         Objective:   Vitals:   02/10/24 1954 02/10/24 2343 02/11/24 0400 02/11/24 0806  BP: (!) 114/95 117/61 121/75 121/67  Pulse: 91 76 77 88  Resp: 17 18 16 16   Temp: 98.4 F (36.9 C) 98.2 F (36.8 C) 98 F (36.7 C) 98.3 F (36.8 C)  TempSrc: Oral Oral Oral Oral  SpO2:  95% 98% 96%  Weight:      Height:        Wt Readings from Last 3 Encounters:  02/09/24 65.9 kg  01/17/24 66.7 kg  12/19/23 69 kg     Intake/Output Summary (Last 24 hours) at 02/11/2024 0903 Last data filed at 02/10/2024 1801 Gross per 24 hour  Intake 95.78 ml  Output --  Net 95.78 ml     Physical Exam  Awake Alert, No new F.N deficits, Normal affect [place].AT,PERRAL Supple Neck, No JVD,   Symmetrical Chest wall movement, Good air movement bilaterally, CTAB iRRR,No Gallops,Rubs or new Murmurs,  +ve B.Sounds, Abd Soft, No tenderness,   No Cyanosis, Clubbing or edema        Data Review:    Recent Labs  Lab 02/08/24 2017 02/08/24 2352 02/09/24 0223 02/09/24 0700 02/10/24 0742  WBC 9.7  --  8.7 10.2 7.4  HGB 12.0 12.9 10.6* 10.9* 10.1*  HCT 39.8 38.0 35.3* 36.5 32.6*  PLT 395  --  302 308 302  MCV 77.9*  --  78.4* 78.3* 76.3*  MCH 23.5*  --  23.6* 23.4* 23.7*  MCHC 30.2  --  30.0 29.9* 31.0  RDW 19.9*  --  19.6* 19.7* 19.8*  LYMPHSABS  --   --   --  3.3 2.0  MONOABS  --   --   --  1.1* 0.7  EOSABS  --   --   --  0.1 0.0  BASOSABS  --   --   --  0.1 0.0    Recent Labs  Lab 02/08/24 2017 02/08/24 2352 02/09/24 0223 02/09/24 0700 02/09/24 1136 02/10/24 0742  NA 123* 122* 129*  --  133* 136  K 4.8 5.0 3.7  --  3.9 3.9  CL 88*  --  97*  --  101 104  CO2 19*  --  17*  --  18* 21*  ANIONGAP 16*  --  15  --  14 11  GLUCOSE 843*  --  472*  --  334* 264*  BUN 46*  --  44*  --  39* 25*  CREATININE 2.30*  --  2.03*  --  1.72* 1.26*  AST  --   --   --   --   --  14*  ALT  --   --   --   --   --  10  ALKPHOS  --   --   --   --   --  74  BILITOT  --    --   --   --   --  0.7  ALBUMIN  --   --   --   --   --  2.3*  TSH  --   --   --  <0.100*  --   --   HGBA1C 13.1*  --   --   --   --   --   MG  --   --   --  1.7  --  1.8  PHOS  --   --   --  2.2*  --  2.8  CALCIUM  9.3  --  8.8*  --  8.9 9.2      Recent Labs  Lab 02/08/24 2017 02/09/24 0223 02/09/24 0700 02/09/24 1136 02/10/24 0742  TSH  --   --  <0.100*  --   --   HGBA1C 13.1*  --   --   --   --   MG  --   --  1.7  --  1.8  CALCIUM  9.3 8.8*  --  8.9 9.2    --------------------------------------------------------------------------------------------------------------- Lab Results  Component Value Date   CHOL 283 (H) 01/15/2024   HDL 128 01/15/2024   LDLCALC 144 (H) 01/15/2024   TRIG 74 01/15/2024   CHOLHDL 2.2 01/15/2024    Lab Results  Component Value Date   HGBA1C 13.1 (H) 02/08/2024   Recent Labs    02/09/24 0700  TSH <0.100*  FREET4 2.68*   No results for input(s): VITAMINB12, FOLATE, FERRITIN, TIBC, IRON, RETICCTPCT in the last 72 hours.    Radiology Report No results found.    Signature  -   Lavada Stank M.D on 02/11/2024 at 9:03 AM   -  To page go to [email or web address]

## 2024-02-11 NOTE — TOC Progression Note (Signed)
 Transition of Care [facility]) - Progression Note    Patient Details  Name: Samantha [name] MRN: [id number] Date of Birth: 1946-01-23  Transition of Care [facility]) CM/SW Contact  Robynn Eileen Hoose, RN Phone Number: 02/11/2024, 2:54 PM  Clinical Narrative:   HHPT recommendations noted, pt last used [facility] 04/2023. Message sent to [facility] with [facility], awaiting response.                       Expected Discharge Plan and Services                                               Social Drivers of Health (SDOH) Interventions SDOH Screenings   Food Insecurity: No Food Insecurity (02/09/2024)  Housing: Low Risk  (02/09/2024)  Transportation Needs: No Transportation Needs (02/09/2024)  Utilities: Not At Risk (02/09/2024)  Alcohol Screen: Low Risk  (11/16/2022)  Depression (PHQ2-9): Medium Risk (11/16/2022)  Financial Resource Strain: Low Risk  (11/16/2022)  Physical Activity: Inactive (11/16/2022)  Social Connections: Moderately Integrated (02/09/2024)  Stress: No Stress Concern Present (11/16/2022)  Tobacco Use: Low Risk  (02/09/2024)    Readmission Risk Interventions     No data to display

## 2024-02-12 ENCOUNTER — Telehealth: Payer: Self-pay | Admitting: "Endocrinology

## 2024-02-12 ENCOUNTER — Telehealth (HOSPITAL_COMMUNITY): Payer: Self-pay | Admitting: Pharmacy Technician

## 2024-02-12 ENCOUNTER — Other Ambulatory Visit (HOSPITAL_COMMUNITY): Payer: Self-pay

## 2024-02-12 DIAGNOSIS — E1110 Type 2 diabetes mellitus with ketoacidosis without coma: Secondary | ICD-10-CM | POA: Diagnosis not present

## 2024-02-12 LAB — GLUCOSE, CAPILLARY: Glucose-Capillary: 278 mg/dL — ABNORMAL HIGH (ref 70–99)

## 2024-02-12 MED ORDER — PEN NEEDLES 32G X 4 MM MISC
1.0000 | Freq: Four times a day (QID) | 0 refills | Status: DC
  Filled 2024-02-12: qty 100, 25d supply, fill #0

## 2024-02-12 MED ORDER — INSULIN STARTER KIT- PEN NEEDLES (ENGLISH)
1.0000 | Freq: Once | 0 refills | Status: AC
  Filled 2024-02-12: qty 1, 1d supply, fill #0

## 2024-02-12 MED ORDER — INSULIN LISPRO (1 UNIT DIAL) 100 UNIT/ML (KWIKPEN)
PEN_INJECTOR | SUBCUTANEOUS | 0 refills | Status: DC
  Filled 2024-02-12: qty 9, 30d supply, fill #0

## 2024-02-12 MED ORDER — BLOOD GLUCOSE MONITOR SYSTEM W/DEVICE KIT
1.0000 | PACK | Freq: Three times a day (TID) | 0 refills | Status: DC
Start: 2024-02-12 — End: 2024-02-16
  Filled 2024-02-12: qty 1, 30d supply, fill #0

## 2024-02-12 MED ORDER — INSULIN GLARGINE 100 UNIT/ML SOLOSTAR PEN
30.0000 [IU] | PEN_INJECTOR | Freq: Every day | SUBCUTANEOUS | 0 refills | Status: DC
  Filled 2024-02-12: qty 9, 30d supply, fill #0

## 2024-02-12 MED ORDER — LANCET DEVICE MISC
1.0000 | Freq: Three times a day (TID) | 0 refills | Status: DC
Start: 2024-02-12 — End: 2024-02-16
  Filled 2024-02-12: qty 1, 30d supply, fill #0

## 2024-02-12 MED ORDER — ACCU-CHEK SOFTCLIX LANCETS MISC
1.0000 | Freq: Three times a day (TID) | 0 refills | Status: DC
Start: 2024-02-12 — End: 2024-02-16
  Filled 2024-02-12: qty 100, 34d supply, fill #0

## 2024-02-12 MED ORDER — BLOOD GLUCOSE TEST VI STRP
1.0000 | ORAL_STRIP | Freq: Three times a day (TID) | 0 refills | Status: DC
Start: 2024-02-12 — End: 2024-02-16
  Filled 2024-02-12: qty 100, 34d supply, fill #0

## 2024-02-12 NOTE — Plan of Care (Signed)

## 2024-02-12 NOTE — Telephone Encounter (Signed)
 Patient called and said she was currently admitted for DKA. She said she needs to see you for her diabetes, she has not completed anything for her thyroid . Please Advise

## 2024-02-12 NOTE — Plan of Care (Signed)
  Problem: Education: Goal: Ability to describe self-care measures that may prevent or decrease complications (Diabetes Survival Skills Education) will improve Outcome: Completed/Met Goal: Individualized Educational Video(s) Outcome: Completed/Met   Problem: Coping: Goal: Ability to adjust to condition or change in health will improve Outcome: Completed/Met   Problem: Fluid Volume: Goal: Ability to maintain a balanced intake and output will improve Outcome: Completed/Met   Problem: Health Behavior/Discharge Planning: Goal: Ability to identify and utilize available resources and services will improve Outcome: Completed/Met Goal: Ability to manage health-related needs will improve Outcome: Completed/Met   Problem: Metabolic: Goal: Ability to maintain appropriate glucose levels will improve Outcome: Completed/Met   Problem: Nutritional: Goal: Maintenance of adequate nutrition will improve Outcome: Completed/Met Goal: Progress toward achieving an optimal weight will improve Outcome: Completed/Met   Problem: Skin Integrity: Goal: Risk for impaired skin integrity will decrease Outcome: Completed/Met   Problem: Tissue Perfusion: Goal: Adequacy of tissue perfusion will improve Outcome: Completed/Met   Problem: Education: Goal: Ability to describe self-care measures that may prevent or decrease complications (Diabetes Survival Skills Education) will improve Outcome: Completed/Met Goal: Individualized Educational Video(s) Outcome: Completed/Met   Problem: Cardiac: Goal: Ability to maintain an adequate cardiac output will improve Outcome: Completed/Met   Problem: Health Behavior/Discharge Planning: Goal: Ability to identify and utilize available resources and services will improve Outcome: Completed/Met Goal: Ability to manage health-related needs will improve Outcome: Completed/Met   Problem: Fluid Volume: Goal: Ability to achieve a balanced intake and output will  improve Outcome: Completed/Met   Problem: Metabolic: Goal: Ability to maintain appropriate glucose levels will improve Outcome: Completed/Met   Problem: Nutritional: Goal: Maintenance of adequate nutrition will improve Outcome: Completed/Met Goal: Maintenance of adequate weight for body size and type will improve Outcome: Completed/Met   Problem: Respiratory: Goal: Will regain and/or maintain adequate ventilation Outcome: Completed/Met   Problem: Urinary Elimination: Goal: Ability to achieve and maintain adequate renal perfusion and functioning will improve Outcome: Completed/Met   Problem: Education: Goal: Knowledge of General Education information will improve Description: Including pain rating scale, medication(s)/side effects and non-pharmacologic comfort measures Outcome: Completed/Met   Problem: Health Behavior/Discharge Planning: Goal: Ability to manage health-related needs will improve Outcome: Completed/Met   Problem: Clinical Measurements: Goal: Ability to maintain clinical measurements within normal limits will improve Outcome: Completed/Met Goal: Will remain free from infection Outcome: Completed/Met Goal: Diagnostic test results will improve Outcome: Completed/Met Goal: Respiratory complications will improve Outcome: Completed/Met Goal: Cardiovascular complication will be avoided Outcome: Completed/Met   Problem: Activity: Goal: Risk for activity intolerance will decrease Outcome: Completed/Met   Problem: Nutrition: Goal: Adequate nutrition will be maintained Outcome: Completed/Met   Problem: Coping: Goal: Level of anxiety will decrease Outcome: Completed/Met   Problem: Elimination: Goal: Will not experience complications related to bowel motility Outcome: Completed/Met Goal: Will not experience complications related to urinary retention Outcome: Completed/Met   Problem: Pain Managment: Goal: General experience of comfort will improve and/or  be controlled Outcome: Completed/Met   Problem: Safety: Goal: Ability to remain free from injury will improve Outcome: Completed/Met   Problem: Skin Integrity: Goal: Risk for impaired skin integrity will decrease Outcome: Completed/Met

## 2024-02-12 NOTE — Plan of Care (Signed)
  Problem: Coping: Goal: Ability to adjust to condition or change in health will improve Outcome: Progressing   Problem: Health Behavior/Discharge Planning: Goal: Ability to identify and utilize available resources and services will improve Outcome: Progressing Goal: Ability to manage health-related needs will improve Outcome: Progressing   Problem: Metabolic: Goal: Ability to maintain appropriate glucose levels will improve Outcome: Progressing   Problem: Nutritional: Goal: Maintenance of adequate nutrition will improve Outcome: Progressing Goal: Progress toward achieving an optimal weight will improve Outcome: Progressing   Problem: Tissue Perfusion: Goal: Adequacy of tissue perfusion will improve Outcome: Progressing   Problem: Fluid Volume: Goal: Ability to achieve a balanced intake and output will improve Outcome: Progressing   Problem: Activity: Goal: Risk for activity intolerance will decrease Outcome: Progressing

## 2024-02-12 NOTE — Telephone Encounter (Signed)
 Called pt lvm .

## 2024-02-12 NOTE — Discharge Summary (Signed)
 TYRONDA VIZCARRONDO FMW:[id number] DOB: 25-Jan-1946 DOA: 02/08/2024  PCP: Campbell Reynolds, NP  Admit date: 02/08/2024  Discharge date: 02/12/2024  Admitted From: Home   Disposition:  Home   Recommendations for Outpatient Follow-up:   Follow up with PCP in 1-2 weeks  PCP Please obtain BMP/CBC, 2 view CXR in 1week,  (see Discharge instructions)   PCP Please follow up on the following pending results:    Home Health: PT, RN if qulifies   Equipment/Devices: as below  Consultations: DM educator Discharge Condition: Stable    CODE STATUS: Full    Diet Recommendation: Heart Healthy Low Carb    Chief Complaint  Patient presents with   Abnormal EKG   Sent from PCP     Brief history of present illness from the day of admission and additional interim summary    78 y.o. female with medical history significant of hypothyroidism, atrial fibrillation, acute pericarditis, hyperlipidemia, type 2 diabetes, CKD stage III who presented to the hospital with complaint of abnormal EKG.  Patient states that she has been having generalized weakness for the past week.  She presented to her primary care doctor today with this complaint and EKG was performed.  Which I thought was abnormal patient appeared to have inverted T waves in the inferior leads and therefore was sent to the emergency room for further evaluation.                                                                  Hospital Course   DKA type2 in DKA She was started on insulin  drip per DKA protocol, adequately hydrated, gap is closed, transitioned to subcu insulin , dose adjusted further on 02/12/2024, diabetic insulin  education provided, now stable on subcu insulin , will be discharged home on below mentioned dose of insulin , testing supplies and 1 month supply of medications provided.   Requested to check CBGs q. ACHS and show the results to PCP within 3 to 4 days of discharge for further adjustment.  Lab Results  Component Value Date   HGBA1C 13.1 (H) 02/08/2024     Paroxysmal atrial fibrillation with rvr advised to score of greater than 3 Went into RVR likely due to #1 above, was briefly placed on Cardizem  drip briefly, and hydrated, now stable on oral Lopressor  with rate control, continue Eliquis  for anticoagulation.  TSH is suppressed and very high free T4, recent echo noted which is stable.  She was also on methimazole  which she had recently stopped which could have attributed to RVR.  Quested to resume methimazole  upon discharge.   Dehydration, AKI on CKD stage IIIb with hyponatremia Hydrated and resolved,, baseline creatinine around 1.3   HLD Will continue zetia , add statin as she is diabetic.   GERD Will continue  protonix    Hypertension continue metoprolol   and hydralazine  for now   Hyperthyroidism - resumed methimazole  which she had stopped recently, she had suppressed TSH with high free T4 in the setting of A-fib RVR.    Discharge diagnosis     Principal Problem:   DKA, type 2, not at goal [facility]) Active Problems:   Essential hypertension, benign   Hyperthyroidism   Paroxysmal atrial fibrillation (HCC)   HLD (hyperlipidemia)    Discharge instructions    Discharge Instructions     Discharge instructions   Complete by: As directed    Follow with Primary MD Campbell Reynolds, NP in 7 days   Get CBC, CMP, Magnesium , TSH, Free T4 -  checked next visit with your primary MD   Activity: As tolerated with Full fall precautions use walker/cane & assistance as needed  Disposition Home    Diet: Heart Healthy Low Carb  Accuchecks 4 times/day, Once in AM empty stomach and then before each meal. Log in all results and show them to your Prim.MD in 3 days. If any glucose reading is under 80 or above 300 call your Prim MD immidiately. Follow Low glucose  instructions for glucose under 80 as instructed.   Special Instructions: If you have smoked or chewed Tobacco  in the last 2 yrs please stop smoking, stop any regular Alcohol  and or any Recreational drug use.  On your next visit with your primary care physician please Get Medicines reviewed and adjusted.  Please request your Prim.MD to go over all Hospital Tests and Procedure/Radiological results at the follow up, please get all Hospital records sent to your Prim MD by signing hospital release before you go home.  If you experience worsening of your admission symptoms, develop shortness of breath, life threatening emergency, suicidal or homicidal thoughts you must seek medical attention immediately by calling 911 or calling your MD immediately  if symptoms less severe.  You Must read complete instructions/literature along with all the possible adverse reactions/side effects for all the Medicines you take and that have been prescribed to you. Take any new Medicines after you have completely understood and accpet all the possible adverse reactions/side effects.   Do not drive when taking Pain medications.  Do not take more than prescribed Pain, Sleep and Anxiety Medications  Wear Seat belts while driving.   Increase activity slowly   Complete by: As directed        Discharge Medications   Allergies as of 02/12/2024   No Known Allergies      Medication List     STOP taking these medications    spironolactone  25 MG tablet Commonly known as: ALDACTONE        TAKE these medications    acetaminophen  500 MG tablet Commonly known as: TYLENOL  Take 500-1,000 mg by mouth every 6 (six) hours as needed for mild pain.   apixaban  5 MG Tabs tablet Commonly known as: ELIQUIS  Take 1 tablet (5 mg total) by mouth 2 (two) times daily.   Blood Glucose Monitoring Suppl Devi 1 each by Does not apply route in the morning, at noon, and at bedtime. May substitute to any manufacturer covered by  patient's insurance.   BLOOD GLUCOSE TEST STRIPS Strp 1 each by In Vitro route in the morning, at noon, and at bedtime. May substitute to any manufacturer covered by patient's insurance.   colchicine  0.6 MG tablet Take 1 tablet (0.6 mg total) by mouth daily.   ferrous sulfate  325 (65 FE) MG tablet Take 325 mg by mouth daily.  folic acid  1 MG tablet Commonly known as: FOLVITE  Take 1 mg by mouth every morning.   hydrALAZINE  100 MG tablet Commonly known as: APRESOLINE  Take 100 mg by mouth 2 (two) times daily.   insulin  glargine 100 UNIT/ML Solostar Pen Commonly known as: LANTUS  Inject 30 Units into the skin daily.   insulin  lispro 100 UNIT/ML KwikPen Commonly known as: HumaLOG  KwikPen Before each meal 3 times a day, 140-199 - 2 units, 200-250 - 4 units, 251-299 - 6 units,  300-349 - 8 units,  350 or above 10 units.   insulin  starter kit- pen needles Misc 1 kit by Other route once for 1 dose.   Insulin  Syringe-Needle U-100 25G X 1 1 ML Misc For 4 times a day insulin  SQ, 1 month supply. Diagnosis E11.65   Lancet Device Misc 1 each by Does not apply route in the morning, at noon, and at bedtime. May substitute to any manufacturer covered by patient's insurance.   Lancets Misc. Misc 1 each by Does not apply route in the morning, at noon, and at bedtime. May substitute to any manufacturer covered by patient's insurance.   metFORMIN  500 MG 24 hr tablet Commonly known as: GLUCOPHAGE -XR Take 1 tablet (500 mg total) by mouth 2 (two) times daily with a meal.   methimazole  10 MG tablet Commonly known as: TAPAZOLE  Take 1 tablet (10 mg total) by mouth daily with breakfast.   metoprolol  tartrate 100 MG tablet Commonly known as: LOPRESSOR  Take 100 mg by mouth 2 (two) times daily.   pantoprazole  40 MG tablet Commonly known as: PROTONIX  Take 1 tablet (40 mg total) by mouth daily.   rosuvastatin  5 MG tablet Commonly known as: CRESTOR  Take 5 mg by mouth every Monday, Wednesday,  and Friday.   valsartan -hydrochlorothiazide 320-12.5 MG tablet Commonly known as: DIOVAN -HCT Take 1 tablet by mouth every morning.         Follow-up Information     Campbell Reynolds, NP. Schedule an appointment as soon as possible for a visit in 1 week(s).   Contact information: [address] [place] [address] [phone]                 Major procedures and Radiology Reports - PLEASE review detailed and final reports thoroughly  -        DG Chest 2 View Result Date: 02/08/2024 CLINICAL DATA:  Abnormal EKG fatigue EXAM: CHEST - 2 VIEW COMPARISON:  Chest CT and x-ray 12/07/2023 FINDINGS: The heart size and mediastinal contours are within normal limits. Both lungs are clear. Multilevel degenerative osteophytes. Atelectasis or scarring at the bases. IMPRESSION: No active cardiopulmonary disease. Electronically Signed   By: Luke Bun M.D.   On: 02/08/2024 20:43   US  Venous Img Lower Unilateral Left (DVT) Result Date: 01/22/2024 CLINICAL DATA:  Left leg pain EXAM: LEFT LOWER EXTREMITY VENOUS DOPPLER ULTRASOUND TECHNIQUE: Gray-scale sonography with graded compression, as well as color Doppler and duplex ultrasound were performed to evaluate the lower extremity deep venous systems from the level of the common femoral vein and including the common femoral, femoral, profunda femoral, popliteal and calf veins including the posterior tibial, peroneal and gastrocnemius veins when visible. The superficial great saphenous vein was also interrogated. Spectral Doppler was utilized to evaluate flow at rest and with distal augmentation maneuvers in the common femoral, femoral and popliteal veins. COMPARISON:  None Available. FINDINGS: Contralateral Common Femoral Vein: Respiratory phasicity is normal and symmetric with the symptomatic side. No evidence of thrombus. Normal compressibility.  Common Femoral Vein: No evidence of thrombus. Normal compressibility, respiratory phasicity and  response to augmentation. Saphenofemoral Junction: No evidence of thrombus. Normal compressibility and flow on color Doppler imaging. Profunda Femoral Vein: No evidence of thrombus. Normal compressibility and flow on color Doppler imaging. Femoral Vein: No evidence of thrombus. Normal compressibility, respiratory phasicity and response to augmentation. Popliteal Vein: No evidence of thrombus. Normal compressibility, respiratory phasicity and response to augmentation. Calf Veins: Limited assessment of the calf veins. Posterior tibial vein is intermittently visualized and appears patent. Peroneal vein not visualized. Superficial Great Saphenous Vein: No evidence of thrombus. Normal compressibility. IMPRESSION: No significant left lower extremity femoropopliteal DVT. Limited assessment of the calf veins. Electronically Signed   By: CHRISTELLA.  Shick M.D.   On: 01/22/2024 15:50    Micro Results    No results found for this or any previous visit (from the past 240 hours).  Today   Subjective    Ronal Mulch today has no headache,no chest abdominal pain,no new weakness tingling or numbness, feels much better wants to go home today.    Objective   Blood pressure 100/71, pulse 80, temperature 98.8 F (37.1 C), temperature source Oral, resp. rate 17, height 5' 1 (1.549 m), weight 65.9 kg, SpO2 99%.  No intake or output data in the 24 hours ending 02/12/24 0820  Exam  Awake Alert, No new F.N deficits,    [place]masa.AT,PERRAL Supple Neck,   Symmetrical Chest wall movement, Good air movement bilaterally, CTAB RRR,No Gallops,   +ve B.Sounds, Abd Soft, Non tender,  No Cyanosis, Clubbing or edema    Data Review   Recent Labs  Lab 02/08/24 2017 02/08/24 2352 02/09/24 0223 02/09/24 0700 02/10/24 0742  WBC 9.7  --  8.7 10.2 7.4  HGB 12.0 12.9 10.6* 10.9* 10.1*  HCT 39.8 38.0 35.3* 36.5 32.6*  PLT 395  --  302 308 302  MCV 77.9*  --  78.4* 78.3* 76.3*  MCH 23.5*  --  23.6* 23.4* 23.7*  MCHC 30.2  --  30.0  29.9* 31.0  RDW 19.9*  --  19.6* 19.7* 19.8*  LYMPHSABS  --   --   --  3.3 2.0  MONOABS  --   --   --  1.1* 0.7  EOSABS  --   --   --  0.1 0.0  BASOSABS  --   --   --  0.1 0.0    Recent Labs  Lab 02/08/24 2017 02/08/24 2352 02/09/24 0223 02/09/24 0700 02/09/24 1136 02/10/24 0742  NA 123* 122* 129*  --  133* 136  K 4.8 5.0 3.7  --  3.9 3.9  CL 88*  --  97*  --  101 104  CO2 19*  --  17*  --  18* 21*  ANIONGAP 16*  --  15  --  14 11  GLUCOSE 843*  --  472*  --  334* 264*  BUN 46*  --  44*  --  39* 25*  CREATININE 2.30*  --  2.03*  --  1.72* 1.26*  AST  --   --   --   --   --  14*  ALT  --   --   --   --   --  10  ALKPHOS  --   --   --   --   --  74  BILITOT  --   --   --   --   --  0.7  ALBUMIN  --   --   --   --   --  2.3*  TSH  --   --   --  <0.100*  --   --   HGBA1C 13.1*  --   --   --   --   --   MG  --   --   --  1.7  --  1.8  PHOS  --   --   --  2.2*  --  2.8  CALCIUM  9.3  --  8.8*  --  8.9 9.2    Total Time in preparing paper work, data evaluation and todays exam - 35 minutes  Signature  -    Lavada Stank M.D on 02/12/2024 at 8:20 AM   -  To page go to [email or web address]

## 2024-02-12 NOTE — Discharge Instructions (Signed)
 Follow with Primary MD Campbell Reynolds, NP in 7 days   Get CBC, CMP, Magnesium , TSH, Free T4 -  checked next visit with your primary MD   Activity: As tolerated with Full fall precautions use walker/cane & assistance as needed  Disposition Home    Diet: Heart Healthy Low Carb  Accuchecks 4 times/day, Once in AM empty stomach and then before each meal. Log in all results and show them to your Prim.MD in 3 days. If any glucose reading is under 80 or above 300 call your Prim MD immidiately. Follow Low glucose instructions for glucose under 80 as instructed.   Special Instructions: If you have smoked or chewed Tobacco  in the last 2 yrs please stop smoking, stop any regular Alcohol  and or any Recreational drug use.  On your next visit with your primary care physician please Get Medicines reviewed and adjusted.  Please request your Prim.MD to go over all Hospital Tests and Procedure/Radiological results at the follow up, please get all Hospital records sent to your Prim MD by signing hospital release before you go home.  If you experience worsening of your admission symptoms, develop shortness of breath, life threatening emergency, suicidal or homicidal thoughts you must seek medical attention immediately by calling 911 or calling your MD immediately  if symptoms less severe.  You Must read complete instructions/literature along with all the possible adverse reactions/side effects for all the Medicines you take and that have been prescribed to you. Take any new Medicines after you have completely understood and accpet all the possible adverse reactions/side effects.   Do not drive when taking Pain medications.  Do not take more than prescribed Pain, Sleep and Anxiety Medications  Wear Seat belts while driving.

## 2024-02-12 NOTE — TOC Transition Note (Addendum)
 Transition of Care [facility]) - Discharge Note   Patient Details  Name: Samantha [name] MRN: [id number] Date of Birth: 01/30/1946  Transition of Care [facility]) CM/SW Contact:  Marval Gell, RN Phone Number: 02/12/2024, 8:52 AM   Clinical Narrative:     Beatris w patient re needs for DC.  She would like rollator as rec by PT, order placed and [employer] will deliver to the room.  Waiting to hear back from [facility] if they can accept patient  [facility] can accept for [facility] services, added to AVS  Spoke w [place]  to add [facility] RN for diabetes education and insulin  administration reinforcement   Final next level of care: Home w Home Health Services Barriers to Discharge: No Barriers Identified   Patient Goals and CMS Choice Patient states their goals for this hospitalization and ongoing recovery are:: to go home CMS Medicare.gov Compare Post Acute Care list provided to:: Patient Choice offered to / list presented to : Patient      Discharge Placement                       Discharge Plan and Services Additional resources added to the After Visit Summary for                  DME Arranged: Walker rolling with seat DME Agency: [employer] Date DME Agency Contacted: 02/12/24 Time DME Agency Contacted: [phone] Representative spoke with at DME Agency: [place] [facility] Arranged: PT, RN          Social Drivers of Health (SDOH) Interventions SDOH Screenings   Food Insecurity: No Food Insecurity (02/09/2024)  Housing: Low Risk  (02/09/2024)  Transportation Needs: No Transportation Needs (02/09/2024)  Utilities: Not At Risk (02/09/2024)  Alcohol Screen: Low Risk  (11/16/2022)  Depression (PHQ2-9): Medium Risk (11/16/2022)  Financial Resource Strain: Low Risk  (11/16/2022)  Physical Activity: Inactive (11/16/2022)  Social Connections: Moderately Integrated (02/09/2024)  Stress: No Stress Concern Present (11/16/2022)  Tobacco Use: Low Risk  (02/09/2024)     Readmission Risk Interventions     No  data to display

## 2024-02-12 NOTE — Inpatient Diabetes Management (Addendum)
 Inpatient Diabetes Program Recommendations  AACE/ADA: New Consensus Statement on Inpatient Glycemic Control (2015)  Target Ranges:  Prepandial:   less than 140 mg/dL      Peak postprandial:   less than 180 mg/dL (1-2 hours)      Critically ill patients:  140 - 180 mg/dL   Lab Results  Component Value Date   GLUCAP 278 (H) 02/12/2024   HGBA1C 13.1 (H) 02/08/2024   MD ordered application of Dexcom 7 at discharge for patient. Education done regarding application and changing CGM sensor (alternate every 10 days on back of arms), 26 minutes warm up, use of glucometer when alert displays, how to scan CGM for glucose reading and information for PCP. Patient has also been given educational packet regarding use CGM sensor including the 1-800 toll free number for any questions, problems or needs related to the [facility] sensors or reader.    Sensor applied by patient to (L) Arm at 0900 am .  Explained that glucose readings will not be available until 1 hour after application.  Patient very appreciative.   Spoke with patient @ bedside. Patient states she never knew she had diabetes although her A1c was > 6.5. Requested patient to take readings of Dexcom G7 with her to her appts and review with her MD.  Discussed normal ranges of glucose and if fasting CBG <90, please call her doctor to inquire if need lower dose of basal insulin . Reviewed need to limit sugary drinks and foods and lower carbohydrates. Patient states understanding.  10:00 am Spent extended time with patient reviewing that the Dexcom only reads her glucose and will need to take basal insulin  along with short acting correction. Discussed the difference in the two insulins and need to keep separate so not to interchange with each other.  Educated patient on insulin  pen use at home. Reviewed contents of insulin  flexpen starter kit. Reviewed all steps of insulin  pen including attachment of needle, 2-unit air shot, dialing up dose, giving  injection, removing needle, disposal of sharps, storage of unused insulin , disposal of insulin  etc. Patient able to provide successful return demonstration. Also reviewed troubleshooting with insulin  pen. MD to give patient Rxs for insulin  pens and insulin  pen needles.  Thank you, Lasharon Dunivan E. Bernhard Koskinen, RN, MSN, CNS, CDCES  Diabetes Coordinator Inpatient Glycemic Control Team Team Pager [phone] (8am-5pm) 02/12/2024 9:53 AM

## 2024-02-12 NOTE — Telephone Encounter (Signed)
 Patient Product/process development scientist completed.    The patient is insured through [facility]. Patient has Medicare and is not eligible for a copay card, but may be able to apply for patient assistance or Medicare RX Payment Plan (Patient Must reach out to their plan, if eligible for payment plan), if available.    Ran test claim for Dexcom G7 Sensor and the current 30 day co-pay is $0.00.  Ran test claim for Freestyle Libre 3 Plus Sensor and the current 30 day co-pay is $0.00.  This test claim was processed through [facility]- copay amounts may vary at other pharmacies due to pharmacy/plan contracts, or as the patient moves through the different stages of their insurance plan.     Morgan Arab, CPHT Pharmacy Technician III Certified Patient Advocate [facility] Patient Advocate Team Direct Number: [phone]  Fax: [phone]

## 2024-02-12 NOTE — Care Management Important Message (Signed)
 Important Message  Patient Details  Name: Samantha [name] MRN: [id number] Date of Birth: Jan 17, 1946   Important Message Given:  Yes - Medicare IM   Patient left prior to IM delivery will mail a copy to the patient home address.  [name] 02/12/2024, 2:20 PM

## 2024-02-13 NOTE — Telephone Encounter (Signed)
 Called pt no answer

## 2024-02-15 NOTE — Telephone Encounter (Signed)
Patient was admitted to the hospital.

## 2024-02-16 ENCOUNTER — Other Ambulatory Visit: Payer: Self-pay

## 2024-02-16 ENCOUNTER — Emergency Department (HOSPITAL_COMMUNITY)

## 2024-02-16 ENCOUNTER — Observation Stay (HOSPITAL_COMMUNITY)
Admission: EM | Admit: 2024-02-16 | Discharge: 2024-02-17 | Disposition: A | Discharge status: Home / Self Care | Attending: Family Medicine | Admitting: Family Medicine

## 2024-02-16 ENCOUNTER — Telehealth: Payer: Self-pay | Admitting: Physician Assistant

## 2024-02-16 ENCOUNTER — Encounter (HOSPITAL_COMMUNITY): Payer: Self-pay

## 2024-02-16 DIAGNOSIS — F1090 Alcohol use, unspecified, uncomplicated: Secondary | ICD-10-CM | POA: Insufficient documentation

## 2024-02-16 DIAGNOSIS — K219 Gastro-esophageal reflux disease without esophagitis: Secondary | ICD-10-CM | POA: Diagnosis not present

## 2024-02-16 DIAGNOSIS — E0590 Thyrotoxicosis, unspecified without thyrotoxic crisis or storm: Secondary | ICD-10-CM | POA: Diagnosis not present

## 2024-02-16 DIAGNOSIS — D631 Anemia in chronic kidney disease: Secondary | ICD-10-CM | POA: Insufficient documentation

## 2024-02-16 DIAGNOSIS — I129 Hypertensive chronic kidney disease with stage 1 through stage 4 chronic kidney disease, or unspecified chronic kidney disease: Secondary | ICD-10-CM | POA: Diagnosis not present

## 2024-02-16 DIAGNOSIS — E1165 Type 2 diabetes mellitus with hyperglycemia: Secondary | ICD-10-CM

## 2024-02-16 DIAGNOSIS — E785 Hyperlipidemia, unspecified: Secondary | ICD-10-CM | POA: Insufficient documentation

## 2024-02-16 DIAGNOSIS — Z79899 Other long term (current) drug therapy: Secondary | ICD-10-CM | POA: Insufficient documentation

## 2024-02-16 DIAGNOSIS — D649 Anemia, unspecified: Secondary | ICD-10-CM | POA: Diagnosis present

## 2024-02-16 DIAGNOSIS — E119 Type 2 diabetes mellitus without complications: Secondary | ICD-10-CM | POA: Insufficient documentation

## 2024-02-16 DIAGNOSIS — R7989 Other specified abnormal findings of blood chemistry: Secondary | ICD-10-CM

## 2024-02-16 DIAGNOSIS — N183 Chronic kidney disease, stage 3 unspecified: Secondary | ICD-10-CM | POA: Diagnosis not present

## 2024-02-16 DIAGNOSIS — I480 Paroxysmal atrial fibrillation: Secondary | ICD-10-CM | POA: Diagnosis not present

## 2024-02-16 DIAGNOSIS — E1110 Type 2 diabetes mellitus with ketoacidosis without coma: Secondary | ICD-10-CM

## 2024-02-16 DIAGNOSIS — E876 Hypokalemia: Secondary | ICD-10-CM | POA: Insufficient documentation

## 2024-02-16 DIAGNOSIS — Z794 Long term (current) use of insulin: Secondary | ICD-10-CM | POA: Diagnosis not present

## 2024-02-16 DIAGNOSIS — N1831 Chronic kidney disease, stage 3a: Secondary | ICD-10-CM | POA: Diagnosis not present

## 2024-02-16 DIAGNOSIS — I4891 Unspecified atrial fibrillation: Secondary | ICD-10-CM | POA: Diagnosis present

## 2024-02-16 LAB — HEPATIC FUNCTION PANEL
ALT: 10 U/L (ref 0–44)
AST: 14 U/L — ABNORMAL LOW (ref 15–41)
Albumin: 2.4 g/dL — ABNORMAL LOW (ref 3.5–5.0)
Alkaline Phosphatase: 75 U/L (ref 38–126)
Bilirubin, Direct: 0.1 mg/dL (ref 0.0–0.2)
Indirect Bilirubin: 0.8 mg/dL (ref 0.3–0.9)
Total Bilirubin: 0.9 mg/dL (ref 0.0–1.2)
Total Protein: 6.5 g/dL (ref 6.5–8.1)

## 2024-02-16 LAB — MAGNESIUM: Magnesium: 1.6 mg/dL — ABNORMAL LOW (ref 1.7–2.4)

## 2024-02-16 LAB — GLUCOSE, CAPILLARY
Glucose-Capillary: 240 mg/dL — ABNORMAL HIGH (ref 70–99)
Glucose-Capillary: 184 mg/dL — ABNORMAL HIGH (ref 70–99)

## 2024-02-16 LAB — CBC
HCT: 34.1 % — ABNORMAL LOW (ref 36.0–46.0)
Hemoglobin: 9.8 g/dL — ABNORMAL LOW (ref 12.0–15.0)
MCH: 23.7 pg — ABNORMAL LOW (ref 26.0–34.0)
MCHC: 28.7 g/dL — ABNORMAL LOW (ref 30.0–36.0)
MCV: 82.6 fL (ref 80.0–100.0)
Platelets: 431 K/uL — ABNORMAL HIGH (ref 150–400)
RBC: 4.13 MIL/uL (ref 3.87–5.11)
RDW: 20.4 % — ABNORMAL HIGH (ref 11.5–15.5)
WBC: 7.2 K/uL (ref 4.0–10.5)
nRBC: 0 % (ref 0.0–0.2)

## 2024-02-16 LAB — BASIC METABOLIC PANEL WITH GFR
Anion gap: 13 (ref 5–15)
BUN: 17 mg/dL (ref 8–23)
CO2: 21 mmol/L — ABNORMAL LOW (ref 22–32)
Calcium: 9 mg/dL (ref 8.9–10.3)
Chloride: 102 mmol/L (ref 98–111)
Creatinine, Ser: 1.23 mg/dL — ABNORMAL HIGH (ref 0.44–1.00)
GFR, Estimated: 45 mL/min — ABNORMAL LOW (ref >60)
Glucose, Bld: 189 mg/dL — ABNORMAL HIGH (ref 70–99)
Potassium: 3.2 mmol/L — ABNORMAL LOW (ref 3.5–5.1)
Sodium: 136 mmol/L (ref 135–145)

## 2024-02-16 LAB — TSH: TSH: <0.1 u[IU]/mL — ABNORMAL LOW (ref 0.350–4.500)

## 2024-02-16 LAB — TROPONIN I (HIGH SENSITIVITY)
Troponin I (High Sensitivity): 12 ng/L (ref <18)
Troponin I (High Sensitivity): 11 ng/L (ref <18)

## 2024-02-16 LAB — CBG MONITORING, ED: Glucose-Capillary: 209 mg/dL — ABNORMAL HIGH (ref 70–99)

## 2024-02-16 LAB — MRSA NEXT GEN BY PCR, NASAL: MRSA by PCR Next Gen: NOT DETECTED

## 2024-02-16 MED ORDER — METOPROLOL TARTRATE 25 MG PO TABS
12.5000 mg | ORAL_TABLET | Freq: Four times a day (QID) | ORAL | Status: DC
  Administered 2024-02-16 – 2024-02-17 (×4): 12.5 mg via ORAL
  Filled 2024-02-16 (×4): qty 1

## 2024-02-16 MED ORDER — FERROUS SULFATE 325 (65 FE) MG PO TABS
325.0000 mg | ORAL_TABLET | Freq: Every day | ORAL | Status: DC
  Administered 2024-02-17: 325 mg via ORAL
  Filled 2024-02-16: qty 1

## 2024-02-16 MED ORDER — ROSUVASTATIN CALCIUM 5 MG PO TABS
5.0000 mg | ORAL_TABLET | ORAL | Status: DC
Start: 2024-02-18 — End: 2024-02-17

## 2024-02-16 MED ORDER — POLYETHYLENE GLYCOL 3350 17 G PO PACK: 17.0000 g | PACK | Freq: Every day | ORAL | Status: DC | PRN

## 2024-02-16 MED ORDER — APIXABAN 5 MG PO TABS
5.0000 mg | ORAL_TABLET | Freq: Two times a day (BID) | ORAL | Status: DC
  Administered 2024-02-16 – 2024-02-17 (×2): 5 mg via ORAL
  Filled 2024-02-16 (×2): qty 1

## 2024-02-16 MED ORDER — MAGNESIUM SULFATE 2 GM/50ML IV SOLN
2.0000 g | Freq: Once | INTRAVENOUS | Status: AC
  Administered 2024-02-16: 2 g via INTRAVENOUS
  Filled 2024-02-16: qty 50

## 2024-02-16 MED ORDER — AMIODARONE HCL IN DEXTROSE 360-4.14 MG/200ML-% IV SOLN
30.0000 mg/h | INTRAVENOUS | Status: DC
  Administered 2024-02-16 – 2024-02-17 (×3): 30 mg/h via INTRAVENOUS
  Filled 2024-02-16 (×2): qty 200

## 2024-02-16 MED ORDER — POTASSIUM CHLORIDE 10 MEQ/100ML IV SOLN
10.0000 meq | INTRAVENOUS | Status: AC
  Administered 2024-02-16 (×4): 10 meq via INTRAVENOUS
  Filled 2024-02-16 (×3): qty 100

## 2024-02-16 MED ORDER — ACETAMINOPHEN 325 MG PO TABS: 650.0000 mg | ORAL_TABLET | Freq: Four times a day (QID) | ORAL | Status: DC | PRN

## 2024-02-16 MED ORDER — DILTIAZEM LOAD VIA INFUSION
15.0000 mg | Freq: Once | INTRAVENOUS | Status: AC
  Administered 2024-02-16: 15 mg via INTRAVENOUS
  Filled 2024-02-16: qty 15

## 2024-02-16 MED ORDER — POTASSIUM CHLORIDE CRYS ER 20 MEQ PO TBCR
40.0000 meq | EXTENDED_RELEASE_TABLET | ORAL | Status: AC
  Administered 2024-02-16 (×2): 40 meq via ORAL
  Filled 2024-02-16 (×2): qty 2

## 2024-02-16 MED ORDER — INSULIN ASPART 100 UNIT/ML IJ SOLN
4.0000 [IU] | Freq: Three times a day (TID) | INTRAMUSCULAR | Status: DC
  Administered 2024-02-16 – 2024-02-17 (×2): 4 [IU] via SUBCUTANEOUS

## 2024-02-16 MED ORDER — SODIUM CHLORIDE 0.9 % IV BOLUS
1000.0000 mL | Freq: Once | INTRAVENOUS | Status: AC
  Administered 2024-02-16: 1000 mL via INTRAVENOUS

## 2024-02-16 MED ORDER — BISACODYL 10 MG RE SUPP: 10.0000 mg | Freq: Every day | RECTAL | Status: DC | PRN

## 2024-02-16 MED ORDER — ONDANSETRON HCL 4 MG PO TABS
4.0000 mg | ORAL_TABLET | Freq: Four times a day (QID) | ORAL | Status: DC | PRN
Start: 2024-02-16 — End: 2024-02-17

## 2024-02-16 MED ORDER — CHLORHEXIDINE GLUCONATE CLOTH 2 % EX PADS
6.0000 | MEDICATED_PAD | Freq: Every day | CUTANEOUS | Status: DC
  Administered 2024-02-17: 6 via TOPICAL

## 2024-02-16 MED ORDER — INSULIN ASPART 100 UNIT/ML IJ SOLN: 0.0000 [IU] | Freq: Every day | INTRAMUSCULAR | Status: DC

## 2024-02-16 MED ORDER — SODIUM CHLORIDE 0.9% FLUSH: 3.0000 mL | INTRAVENOUS | Status: DC | PRN

## 2024-02-16 MED ORDER — TRAZODONE HCL 50 MG PO TABS: 50.0000 mg | ORAL_TABLET | Freq: Every evening | ORAL | Status: DC | PRN

## 2024-02-16 MED ORDER — SODIUM CHLORIDE 0.9 % IV BOLUS
500.0000 mL | Freq: Once | INTRAVENOUS | Status: AC
  Administered 2024-02-16: 500 mL via INTRAVENOUS

## 2024-02-16 MED ORDER — ACETAMINOPHEN 650 MG RE SUPP: 650.0000 mg | Freq: Four times a day (QID) | RECTAL | Status: DC | PRN

## 2024-02-16 MED ORDER — SODIUM CHLORIDE 0.9 % IV SOLN: INTRAVENOUS | Status: DC | PRN

## 2024-02-16 MED ORDER — PANTOPRAZOLE SODIUM 40 MG PO TBEC
40.0000 mg | DELAYED_RELEASE_TABLET | Freq: Every day | ORAL | Status: DC
  Administered 2024-02-16 – 2024-02-17 (×2): 40 mg via ORAL
  Filled 2024-02-16 (×2): qty 1

## 2024-02-16 MED ORDER — AMIODARONE HCL IN DEXTROSE 360-4.14 MG/200ML-% IV SOLN
60.0000 mg/h | INTRAVENOUS | Status: AC
  Administered 2024-02-16: 60 mg/h via INTRAVENOUS
  Filled 2024-02-16 (×2): qty 200

## 2024-02-16 MED ORDER — DILTIAZEM HCL-DEXTROSE 125-5 MG/125ML-% IV SOLN (PREMIX)
5.0000 mg/h | INTRAVENOUS | Status: DC
  Administered 2024-02-16: 5 mg/h via INTRAVENOUS
  Filled 2024-02-16: qty 125

## 2024-02-16 MED ORDER — FOLIC ACID 1 MG PO TABS
1.0000 mg | ORAL_TABLET | Freq: Every morning | ORAL | Status: DC
  Administered 2024-02-17: 1 mg via ORAL
  Filled 2024-02-16: qty 1

## 2024-02-16 MED ORDER — SODIUM CHLORIDE 0.9% FLUSH
3.0000 mL | Freq: Two times a day (BID) | INTRAVENOUS | Status: DC
  Administered 2024-02-16 – 2024-02-17 (×3): 3 mL via INTRAVENOUS

## 2024-02-16 MED ORDER — METHIMAZOLE 5 MG PO TABS
10.0000 mg | ORAL_TABLET | Freq: Three times a day (TID) | ORAL | Status: DC
  Administered 2024-02-16 – 2024-02-17 (×2): 10 mg via ORAL
  Filled 2024-02-16 (×2): qty 2

## 2024-02-16 MED ORDER — AMIODARONE LOAD VIA INFUSION
150.0000 mg | Freq: Once | INTRAVENOUS | Status: AC
  Administered 2024-02-16: 150 mg via INTRAVENOUS
  Filled 2024-02-16: qty 83.34

## 2024-02-16 MED ORDER — INSULIN GLARGINE-YFGN 100 UNIT/ML ~~LOC~~ SOLN
20.0000 [IU] | Freq: Every day | SUBCUTANEOUS | Status: DC
  Administered 2024-02-16: 20 [IU] via SUBCUTANEOUS
  Filled 2024-02-16 (×2): qty 0.2

## 2024-02-16 MED ORDER — INSULIN ASPART 100 UNIT/ML IJ SOLN
0.0000 [IU] | Freq: Three times a day (TID) | INTRAMUSCULAR | Status: DC
  Administered 2024-02-16: 5 [IU] via SUBCUTANEOUS
  Administered 2024-02-17: 2 [IU] via SUBCUTANEOUS

## 2024-02-16 MED ORDER — ONDANSETRON HCL 4 MG/2ML IJ SOLN: 4.0000 mg | Freq: Four times a day (QID) | INTRAMUSCULAR | Status: DC | PRN

## 2024-02-16 MED ORDER — METHIMAZOLE 5 MG PO TABS
10.0000 mg | ORAL_TABLET | Freq: Every day | ORAL | Status: DC
  Administered 2024-02-16: 10 mg via ORAL
  Filled 2024-02-16: qty 2

## 2024-02-16 NOTE — H&P (Signed)
 History and Physical    Patient: Samantha [name]:[id number] DOB: 1945-12-20 DOA: 02/16/2024 DOS: the patient was seen and examined on 02/16/2024 PCP: Campbell Reynolds, NP  Patient coming from: Home  Chief Complaint:  Chief Complaint  Patient presents with   Atrial Fibrillation        HPI: NIAMH RADA is a 78 y.o. female with medical history significant for hypothyroidism, paroxysmal atrial fibrillation, HLD, DM2, and CKD 3B who was discharged from [facility] on 02/12/2023 after treatment for DKA and A-fib with RVR in the setting of uncontrolled hyperthyroidism presented to the ED with palpitations and found to be in A-fib with RVR with heart rate close to 160 bpm--- patient was initially started on Cardizem  drip but developed hypotension subsequently transition to IV amiodarone  drip -No fever  Or chills  -- Additional history obtained from patient's daughter at bedside No fever  Or chills   No Nausea or Vomiting  -Patient reports episodes of diarrhea on 02/15/2024 - No URI symptoms,  - No Pleuritic symptoms -- EKG A-fib with RVR heart rate 162 -Troponin 12 repeat troponin 11 - Chest x-ray mild cardiomegaly suspect atelectasis - Potassium 3.2, bicarb 21, sodium 136, BUN 17, creatinine 1.23 which is close to creatinine from 02/10/2024 - Anion gap 13, glucose 189 - Magnesium  is 1.6, LFTs are not elevated  TSH-less than 0.100 - Free T4 was elevated at 2.68 on 02/09/2024 (repeat free T4 pending)  Review of Systems: As mentioned in the history of present illness. All other systems reviewed and are negative. Past Medical History:  Diagnosis Date   Allergic rhinitis    Arthritis of both knees    Arthritis of both shoulder regions    Bilateral carpal tunnel syndrome    Cataract    Chronic low back pain    CKD (chronic kidney disease), stage III (HCC)    Decreased vision    Diabetes mellitus    HTN (hypertension)    Hyperlipidemia    Morbid obesity (HCC)    Thyroid  disease    Past  Surgical History:  Procedure Laterality Date   CATARACT EXTRACTION     COLONOSCOPY     COLONOSCOPY N/A 09/15/2020   Procedure: COLONOSCOPY;  Surgeon: Shaaron Lamar HERO, MD;  Location: [facility];  Service: Endoscopy;  Laterality: N/A;  ASA II / PM procedure   POLYPECTOMY  09/15/2020   Procedure: POLYPECTOMY;  Surgeon: Shaaron Lamar HERO, MD;  Location: [facility];  Service: Endoscopy;;   TUBAL LIGATION     Social History:  reports that she has never smoked. She has never used smokeless tobacco. She reports current alcohol use. She reports that she does not use drugs.  No Known Allergies  Family History  Problem Relation Age of Onset   Hypertension Father    Hypertension Mother    Diabetes Mother    Breast cancer Mother    Hypertension Sister    Cancer Sister        pancreatic   Cancer Sister        pancreatic   Liver disease Sister     Prior to Admission medications   Medication Sig Start Date End Date Taking? Authorizing Provider  apixaban  (ELIQUIS ) 5 MG TABS tablet Take 1 tablet (5 mg total) by mouth 2 (two) times daily. 12/19/23   Vicci Rollo SAUNDERS, PA-C  ferrous sulfate  325 (65 FE) MG tablet Take 325 mg by mouth daily. 09/04/23   [provider]  folic acid  (  FOLVITE ) 1 MG tablet Take 1 mg by mouth every morning. 06/01/23   [provider]  insulin  glargine (LANTUS ) 100 UNIT/ML Solostar Pen Inject 30 Units into the skin daily. 02/12/24   Singh, Prashant K, MD  methimazole  (TAPAZOLE ) 10 MG tablet Take 1 tablet (10 mg total) by mouth daily with breakfast. 09/14/23   Lenis Ethelle ORN, MD  pantoprazole  (PROTONIX ) 40 MG tablet Take 1 tablet (40 mg total) by mouth daily. 12/11/23   Darci Pore, MD  rosuvastatin  (CRESTOR ) 5 MG tablet Take 5 mg by mouth every Monday, Wednesday, and Friday. 11/12/23   [provider]    Physical Exam: Vitals:   02/16/24 1500 02/16/24 1600 02/16/24 1700 02/16/24 1800  BP: 121/63 (!) 92/56 112/72 (!) 109/50  Pulse: 79  70 84 81  Resp: 16 14 (!) 23 20  Temp:  98.2 F (36.8 C)    TempSrc:  Oral    SpO2: 100% 100% 100% 100%  Weight:      Height:        Physical Exam Gen:- Awake Alert, in no acute distress  HEENT:- [place].AT, No sclera icterus Neck-Supple Neck,No JVD,.  Lungs-  CTAB , fair air movement bilaterally  CV- S1, S2 normal, Irregularly irregular and tachycardic Abd-  +ve B.Sounds, Abd Soft, No tenderness,    Extremity/Skin:- No  edema,   good pedal pulses  Psych-affect is appropriate, oriented x3 Neuro-no new focal deficits, no tremors  Data Reviewed: EKG A-fib with RVR heart rate 162 -Troponin 12 repeat troponin 11 - Chest x-ray mild cardiomegaly suspect atelectasis - Potassium 3.2, bicarb 21, sodium 136, BUN 17, creatinine 1.23 which is close to creatinine from 02/10/2024 - Anion gap 13, glucose 189 - Magnesium  is 1.6, LFTs are not elevated  TSH-less than 0.100 - Free T4 was elevated at 2.68 on 02/09/2024 (repeat free T4 pending)  Assessment and Plan: 1) A-fib with RVR in the setting of uncontrolled hyperthyroidism-- -EKG A-fib with RVR heart rate 162 - Hypokalemia and hypomagnesemia noted - Echo from 12/09/2023 with EF of 60 to 65% with mild left ventricular hypertrophy, no mitral stenosis, moderate to severe TR noted no aortic stenosis --- Developed hypotension with IV Cardizem , improved with IV fluids - Rate control improving on IV amiodarone  drip - Will be unable to transition to oral amiodarone  given thyroid  issues - Metoprolol  12.5 mg every 8 hours and titrate as BP allows - Continue Eliquis  for stroke prophylaxis - EDP discussed with on-call cardiologist Dr. Debera  2) uncontrolled hyperthyroidism--TSH-less than 0.100 - Free T4 was elevated at 2.68 on 02/09/2024 (repeat free T4 pending) -- Changed to Paxil to 10 mg 3 times daily from daily - Metoprolol  as above #1 - Patient will need to follow-up with Dr. Lenis Ethelle-: Endocrinology  3)DM2- A1C--13.1 Reflecting  uncontrolled DM with persistent hyperglycemia -Restart long-acting insulin  Use Novolog /Humalog  Sliding scale insulin  with Accu-Cheks/Fingersticks as ordered   4) hypokalemia/hypomagnesemia--- had episode of diarrhea 02/15/2024 - Replace electrolytes especially in the setting of A-fib with RVR  5)CKD stage -3B    - Creatinine currently at baseline - renally adjust medications, avoid nephrotoxic agents / dehydration  / hypotension   6) chronic anemia of CKD----stable with Hgb close to 10 - No bleeding concerns -Continue iron supplementation and folic acid  - Monitor closely while on Eliquis   7)GERD--- continue Protonix    Advance Care Planning:   Code Status: Full Code   Consults: EDP discussed case with on-call cardiologist Dr. Debera  Family Communication: -Discussed with daughter  at bedside  Severity of Illness: The appropriate patient status for this patient is OBSERVATION. Observation status is judged to be reasonable and necessary in order to provide the required intensity of service to ensure the patient's safety. The patient's presenting symptoms, physical exam findings, and initial radiographic and laboratory data in the context of their medical condition is felt to place them at decreased risk for further clinical deterioration. Furthermore, it is anticipated that the patient will be medically stable for discharge from the hospital within 2 midnights of admission.   Author: Rendall Carwin, MD 02/16/2024 7:05 PM  For on call review [email or web address].

## 2024-02-16 NOTE — Progress Notes (Signed)
 Patient arrived to stepdown unit, noted patient to be in NSR. Rates 80s. Amiodarone  gtt continues to infuse.

## 2024-02-16 NOTE — ED Provider Notes (Signed)
 [place] EMERGENCY DEPARTMENT AT [facility] Provider Note   CSN: [id number] Arrival date & time: 02/16/24  1000     Patient presents with: Atrial Fibrillation ( )   Samantha [name] is a 78 y.o. female.   Patient is a 79 year old female who presents the emergency department with a chief complaint of palpitations and chest pain which began this morning.  She does have a history of atrial fibrillation and notes that she has been compliant with her Eliquis .  Patient notes that she has had no associated shortness of breath.  She denies any abdominal pain, nausea, vomiting, diarrhea.  She denies any swelling to her lower extremities.  Patient notes that she has not been taking her thyroid  medications as she does not have these.  She denies any dizziness, lightheadedness or syncope.   Atrial Fibrillation Associated symptoms include chest pain.       Prior to Admission medications   Medication Sig Start Date End Date Taking? Authorizing Provider  Accu-Chek Softclix Lancets lancets Use 1 each in the morning, at noon, and at bedtime. 02/12/24 03/17/24  Singh, Prashant K, MD  acetaminophen  (TYLENOL ) 500 MG tablet Take 500-1,000 mg by mouth every 6 (six) hours as needed for mild pain.    [provider]  apixaban  (ELIQUIS ) 5 MG TABS tablet Take 1 tablet (5 mg total) by mouth 2 (two) times daily. 12/19/23   Vicci Rollo SAUNDERS, PA-C  Blood Glucose Monitoring Suppl (BLOOD GLUCOSE MONITOR SYSTEM) w/Device KIT Use in the morning, at noon, and at bedtime. 02/12/24   Singh, Prashant K, MD  colchicine  0.6 MG tablet Take 1 tablet (0.6 mg total) by mouth daily. 12/19/23   Vicci Rollo SAUNDERS, PA-C  ferrous sulfate  325 (65 FE) MG tablet Take 325 mg by mouth daily. 09/04/23   [provider]  folic acid  (FOLVITE ) 1 MG tablet Take 1 mg by mouth every morning. 06/01/23   [provider]  Glucose Blood (BLOOD GLUCOSE TEST STRIPS) STRP Use 1 each in the morning, at noon, and at bedtime.  02/12/24 03/17/24  Dennise Lavada POUR, MD  hydrALAZINE  (APRESOLINE ) 100 MG tablet Take 100 mg by mouth 2 (two) times daily. 06/29/23   [provider]  insulin  glargine (LANTUS ) 100 UNIT/ML Solostar Pen Inject 30 Units into the skin daily. 02/12/24   Singh, Prashant K, MD  insulin  lispro (HUMALOG  KWIKPEN) 100 UNIT/ML KwikPen Inject into the skin before each meal 3 times a day based on blood glucose: 140-199 = 2 units, 200-250 = 4 units, 251-299 = 6 units,  300-349 = 8 units,  350 or above 10 units. 02/12/24   Singh, Prashant K, MD  Insulin  Pen Needle (PEN NEEDLES) 32G X 4 MM MISC Use1 each 4 (four) times daily. 02/12/24   Singh, Prashant K, MD  Lancet Device MISC 1 each by Does not apply route in the morning, at noon, and at bedtime. May substitute to any manufacturer covered by patient's insurance. 02/12/24 03/13/24  Dennise Lavada POUR, MD  metFORMIN  (GLUCOPHAGE -XR) 500 MG 24 hr tablet Take 1 tablet (500 mg total) by mouth 2 (two) times daily with a meal. 03/12/23   Arellano Zameza, Priscila, MD  methimazole  (TAPAZOLE ) 10 MG tablet Take 1 tablet (10 mg total) by mouth daily with breakfast. Patient not taking: Reported on 02/08/2024 09/14/23   Nida, Gebreselassie W, MD  metoprolol  tartrate (LOPRESSOR ) 100 MG tablet Take 100 mg by mouth 2 (two) times daily. 06/21/23   [provider]  pantoprazole  (PROTONIX ) 40 MG tablet Take 1 tablet (40 mg total) by mouth daily. 12/11/23   Darci Pore, MD  rosuvastatin  (CRESTOR ) 5 MG tablet Take 5 mg by mouth every Monday, Wednesday, and Friday. 11/12/23   [provider]  valsartan -hydrochlorothiazide (DIOVAN -HCT) 320-12.5 MG tablet Take 1 tablet by mouth every morning. 01/11/24   [provider]    Allergies: Patient has no known allergies.    Review of Systems  Cardiovascular:  Positive for chest pain and palpitations.  All other systems reviewed and are negative.   Updated Vital Signs BP (!) 89/70 (BP Location: Right Arm)   Pulse (!)  138   Temp 97.8 F (36.6 C) (Oral)   Resp 18   Ht 5' 1 (1.549 m)   Wt 61.2 kg   SpO2 100%   BMI 25.51 kg/m   Physical Exam Vitals and nursing note reviewed.  Constitutional:      General: She is not in acute distress.    Appearance: Normal appearance. She is not ill-appearing.  HENT:     Head: Normocephalic and atraumatic.     Nose: Nose normal.     Mouth/Throat:     Mouth: Mucous membranes are moist.  Eyes:     Extraocular Movements: Extraocular movements intact.     Conjunctiva/sclera: Conjunctivae normal.     Pupils: Pupils are equal, round, and reactive to light.  Cardiovascular:     Rate and Rhythm: Tachycardia present. Rhythm irregular.     Pulses: Normal pulses.     Heart sounds: Normal heart sounds. No murmur heard.    No gallop.  Pulmonary:     Effort: Pulmonary effort is normal. No respiratory distress.     Breath sounds: Normal breath sounds. No stridor. No wheezing, rhonchi or rales.  Abdominal:     General: Abdomen is flat. Bowel sounds are normal. There is no distension.     Palpations: Abdomen is soft.     Tenderness: There is no abdominal tenderness. There is no guarding.  Musculoskeletal:        General: Normal range of motion.     Cervical back: Normal range of motion and neck supple.     Right lower leg: No edema.     Left lower leg: No edema.  Skin:    General: Skin is warm and dry.  Neurological:     General: No focal deficit present.     Mental Status: She is alert and oriented to person, place, and time. Mental status is at baseline.  Psychiatric:        Mood and Affect: Mood normal.        Behavior: Behavior normal.        Thought Content: Thought content normal.        Judgment: Judgment normal.     (all labs ordered are listed, but only abnormal results are displayed) Labs Reviewed  BASIC METABOLIC PANEL WITH GFR  CBC  HEPATIC FUNCTION PANEL  MAGNESIUM   TSH  TROPONIN I (HIGH SENSITIVITY)    EKG: None  Radiology: No results  found.   .Critical Care  Performed by: Daralene Lonni BIRCH, PA-C Authorized by: Daralene Lonni BIRCH, PA-C   Critical care provider statement:    Critical care time (minutes):  50   Critical care was necessary to treat or prevent imminent or life-threatening deterioration of the following conditions: Atrial fibrillation with RVR.   Critical care was time spent personally by me on the following activities:  Development of  treatment plan with patient or surrogate, discussions with consultants, evaluation of patient's response to treatment, examination of patient, ordering and review of laboratory studies, ordering and review of radiographic studies, ordering and performing treatments and interventions, pulse oximetry, re-evaluation of patient's condition and review of old charts   I assumed direction of critical care for this patient from another provider in my specialty: no     Care discussed with: admitting provider      Medications Ordered in the ED  diltiazem  (CARDIZEM ) 1 mg/mL load via infusion 15 mg (has no administration in time range)    And  diltiazem  (CARDIZEM ) 125 mg in dextrose  5% 125 mL (1 mg/mL) infusion (has no administration in time range)                                    Medical Decision Making Amount and/or Complexity of Data Reviewed Labs: ordered. Radiology: ordered.  Risk Prescription drug management. Decision regarding hospitalization.   This patient presents to the ED for concern of chest pain, palpitations, this involves an extensive number of treatment options, and is a complaint that carries with it a high risk of complications and morbidity.  The differential diagnosis includes atrial fibrillation with RVR, atrial flutter, electrolyte derangement, acute kidney injury, ACS, pulmonary embolus, sepsis, pneumonia, thyrotoxicosis, thyroid  storm   Co morbidities that complicate the patient evaluation  History of thyroid  disease and atrial  fibrillation   Additional history obtained:  Additional history obtained from medical records External records from outside source obtained and reviewed including medical records   Lab Tests:  I Ordered, and personally interpreted labs.  The pertinent results include: No leukocytosis, anemia at baseline, hypokalemia and hypomagnesemia noted, creatinine at baseline, normal liver function, negative troponin, TSH less than 0.1   Imaging Studies ordered:  I ordered imaging studies including chest x-ray I independently visualized and interpreted imaging which showed mild cardiomegaly and atelectasis I agree with the radiologist interpretation   Cardiac Monitoring: / EKG:  The patient was maintained on a cardiac monitor.  I personally viewed and interpreted the cardiac monitored which showed an underlying rhythm of: Atrial fibrillation with RVR, no ST/T wave changes, no ischemic changes, no STEMI   Consultations Obtained:  I requested consultation with the cardiology, Dr. Debera,  and discussed lab and imaging findings as well as pertinent plan - they recommend: Admission, continue amiodarone  drip, may add on beta-blocker or calcium  channel blocker if blood pressure will tolerate, continue management of thyroid  disease   Problem List / ED Course / Critical interventions / Medication management  Patient is doing well at this time and does remain stable.  She does remain in atrial fibrillation with RVR and is currently on amiodarone  drip.  Did discuss these findings with cardiology who provided the above recommendations.  Patient has no further hypotension at this point.  He did note that we can add on a beta-blocker or calcium  channel blocker if needed if her blood pressure will tolerate.  TSH level was low and suspect that this is playing a role in her tachycardia at this point.  She has been off of her methimazole  for the past week.  Do not suspect thyroid  storm at this point as patient  has no associated fever and is mentating at her baseline.  Patient has no obvious indication for CHF at this point.  Low suspicion for pulmonary embolus.  Patient is already  anticoagulated on Eliquis  at this point.  Have discussed patient case with Dr. JAYSON Carwin with the hospitalist service who has excepted for admission. I ordered medication including diltiazem , amiodarone , IV fluids, magnesium , potassium for atrial fibrillation with RVR, hypokalemia, hypomagnesemia Reevaluation of the patient after these medicines showed that the patient improved I have reviewed the patients home medicines and have made adjustments as needed   Social Determinants of Health:  None   Test / Admission - Considered:  Admission     Final diagnoses:  None    ED Discharge Orders     None          Daralene Lonni JONETTA DEVONNA 02/16/24 1420    Suzette Pac, MD 02/16/24 1645

## 2024-02-16 NOTE — Telephone Encounter (Signed)
 Paged by the patient's daughter also her mother is currently at [facility], ICU with A-fib with RVR.  Her daughter wanted me to make sure she is not on any medication that can posterior into her A-fib.  I reviewed her medication list and reassured her daughter that her medication is meant to control the A-fib.  However given her age and recent hospital admission for diabetic ketoacidosis, there is some stress on her body that can potentially put her back into A-fib.  The daughter was appreciative for the call and wanted me to notify her cardiology provider regarding the admission

## 2024-02-16 NOTE — ED Triage Notes (Signed)
 Pt recently diagnosed with Afib and has not started medications yet. Pt complaining of Chest pain in mid chest. Pt currently in Afib with HR of 158

## 2024-02-16 NOTE — ED Notes (Addendum)
 Called to room to assist pt to restroom.  Pt was noted to be hypotensive.  Pt had an episode of nausea and attempted to vomit.  While assessing pt she had and episode of staring off and slowness to respond.  Cardizem  stopped, pt repositioned.  Dr. Zammit and RN Laymon S to bedside for assistance.  Pt receiving additional fluid bolus.  Mentation improved.  BP improving

## 2024-02-16 NOTE — ED Notes (Signed)
 MD at bedside. Kellogg RN

## 2024-02-17 DIAGNOSIS — E1165 Type 2 diabetes mellitus with hyperglycemia: Secondary | ICD-10-CM

## 2024-02-17 DIAGNOSIS — E0590 Thyrotoxicosis, unspecified without thyrotoxic crisis or storm: Secondary | ICD-10-CM | POA: Diagnosis not present

## 2024-02-17 DIAGNOSIS — I480 Paroxysmal atrial fibrillation: Secondary | ICD-10-CM | POA: Diagnosis not present

## 2024-02-17 LAB — RENAL FUNCTION PANEL
Albumin: 2.1 g/dL — ABNORMAL LOW (ref 3.5–5.0)
Anion gap: 5 (ref 5–15)
BUN: 15 mg/dL (ref 8–23)
CO2: 20 mmol/L — ABNORMAL LOW (ref 22–32)
Calcium: 8.6 mg/dL — ABNORMAL LOW (ref 8.9–10.3)
Chloride: 111 mmol/L (ref 98–111)
Creatinine, Ser: 1.17 mg/dL — ABNORMAL HIGH (ref 0.44–1.00)
GFR, Estimated: 48 mL/min — ABNORMAL LOW (ref >60)
Glucose, Bld: 118 mg/dL — ABNORMAL HIGH (ref 70–99)
Phosphorus: 2.5 mg/dL (ref 2.5–4.6)
Potassium: 4.5 mmol/L (ref 3.5–5.1)
Sodium: 136 mmol/L (ref 135–145)

## 2024-02-17 LAB — GLUCOSE, CAPILLARY
Glucose-Capillary: 121 mg/dL — ABNORMAL HIGH (ref 70–99)
Glucose-Capillary: 114 mg/dL — ABNORMAL HIGH (ref 70–99)

## 2024-02-17 LAB — MAGNESIUM: Magnesium: 1.7 mg/dL (ref 1.7–2.4)

## 2024-02-17 LAB — T4, FREE: Free T4: 3.71 ng/dL — ABNORMAL HIGH (ref 0.61–1.12)

## 2024-02-17 MED ORDER — PANTOPRAZOLE SODIUM 40 MG PO TBEC: 40.0000 mg | DELAYED_RELEASE_TABLET | Freq: Every day | ORAL | 5 refills | Status: AC

## 2024-02-17 MED ORDER — K PHOS MONO-SOD PHOS DI & MONO 155-852-130 MG PO TABS
500.0000 mg | ORAL_TABLET | Freq: Three times a day (TID) | ORAL | Status: DC
  Administered 2024-02-17: 500 mg via ORAL
  Filled 2024-02-17: qty 2

## 2024-02-17 MED ORDER — METOPROLOL TARTRATE 25 MG PO TABS: 25.0000 mg | ORAL_TABLET | Freq: Two times a day (BID) | ORAL | 2 refills | Status: DC

## 2024-02-17 MED ORDER — FERROUS SULFATE 325 (65 FE) MG PO TABS: 325.0000 mg | ORAL_TABLET | ORAL | 3 refills | Status: AC

## 2024-02-17 MED ORDER — METOPROLOL TARTRATE 25 MG PO TABS
25.0000 mg | ORAL_TABLET | Freq: Once | ORAL | Status: AC
  Administered 2024-02-17: 25 mg via ORAL
  Filled 2024-02-17: qty 1

## 2024-02-17 MED ORDER — FOLIC ACID 1 MG PO TABS: 1.0000 mg | ORAL_TABLET | Freq: Every day | ORAL | 2 refills | Status: AC

## 2024-02-17 MED ORDER — METHIMAZOLE 10 MG PO TABS: 10.0000 mg | ORAL_TABLET | Freq: Two times a day (BID) | ORAL | 1 refills | Status: DC

## 2024-02-17 MED ORDER — METOPROLOL TARTRATE 25 MG PO TABS
12.5000 mg | ORAL_TABLET | Freq: Two times a day (BID) | ORAL | 2 refills | Status: DC
Start: 2024-02-17 — End: 2024-02-17

## 2024-02-17 MED ORDER — APIXABAN 5 MG PO TABS: 5.0000 mg | ORAL_TABLET | Freq: Two times a day (BID) | ORAL | 3 refills | Status: AC

## 2024-02-17 MED ORDER — MAGNESIUM SULFATE 2 GM/50ML IV SOLN
2.0000 g | Freq: Once | INTRAVENOUS | Status: AC
  Administered 2024-02-17: 2 g via INTRAVENOUS
  Filled 2024-02-17: qty 50

## 2024-02-17 MED ORDER — ROSUVASTATIN CALCIUM 5 MG PO TABS: 5.0000 mg | ORAL_TABLET | ORAL | 3 refills | Status: AC

## 2024-02-17 NOTE — Discharge Instructions (Signed)
 1)Watch for bleeding while on Blood Thinners--watch for blood in your stool which can make your stool black, maroon, mahogany or red---, blood in your urine which can make your urine pink or red, nosebleeds , also watch for possible bruising -You are taking Apixaban /Eliquis --- which is a blood thinner--- be careful to avoid injury or falls  2)Avoid ibuprofen /Advil /Aleve /Motrin [name]/Naproxen /BC powders/Meloxicam/Diclofenac /Indomethacin and other Nonsteroidal anti-inflammatory medications as these will make you more likely to bleed and can cause stomach ulcers, can also cause Kidney problems.   3) follow up with Endocrinologist Dr. Ethelle Earl, MD, Endocrinology, Diabetes & [facility] Monday, 02/18/2024 at [facility], [address] [place], [place] [address], Phone Number- tel:[phone]  4)Please Note that has been several changes to her medications

## 2024-02-17 NOTE — TOC CM/SW Note (Signed)
 Pt is independent and will dc home to self-care today.  PCP list added to AVS.

## 2024-02-17 NOTE — Discharge Summary (Addendum)
 Samantha [name], is a 78 y.o. female  DOB 1945/10/16  MRN [id number].  Admission date:  02/16/2024  Admitting Physician  Samantha Carwin, MD  Discharge Date:  02/17/2024   Primary MD  Samantha Reynolds, NP  Recommendations for primary care physician for things to follow:  1)Watch for bleeding while on Blood Thinners--watch for blood in your stool which can make your stool black, maroon, mahogany or red---, blood in your urine which can make your urine pink or red, nosebleeds , also watch for possible bruising -You are taking Apixaban /Eliquis --- which is a blood thinner--- be careful to avoid injury or falls  2)Avoid ibuprofen /Advil /Aleve /Motrin Samantha [name]/Naproxen /BC [name]/Meloxicam/Diclofenac /Indomethacin and other Nonsteroidal anti-inflammatory medications as these will make you more likely to bleed and can cause stomach ulcers, can also cause Kidney problems.   3) follow up with Endocrinologist Dr. Ethelle Earl, MD, Endocrinology, Diabetes & [facility] Monday, 02/18/2024 at [facility], [address] [place], [place] [address], Phone Number- tel:[phone]  4)Please Note that has been several changes to her medications  Admission Diagnosis  Hypokalemia [E87.6] Hypomagnesemia [E83.42] Afib (HCC) [I48.91] Atrial fibrillation with RVR (HCC) [I48.91] Low TSH level [R79.89]   Discharge Diagnosis  Hypokalemia [E87.6] Hypomagnesemia [E83.42] Afib (HCC) [I48.91] Atrial fibrillation with RVR (HCC) [I48.91] Low TSH level [R79.89]    Principal Problem:   Afib (HCC) Active Problems:   Uncontrolled type 2 diabetes mellitus with hyperglycemia, with long-term current use of insulin  (HCC)   Hyperthyroidism   Paroxysmal atrial fibrillation (HCC)   ANEMIA-NOS   CKD (chronic kidney disease) stage 3, GFR 30-59 ml/min (HCC)      Past Medical History:  Diagnosis Date   Allergic rhinitis    Arthritis of both knees     Arthritis of both shoulder regions    Bilateral carpal tunnel syndrome    Cataract    Chronic low back pain    CKD (chronic kidney disease), stage III (HCC)    Decreased vision    Diabetes mellitus    HTN (hypertension)    Hyperlipidemia    Morbid obesity (HCC)    Thyroid  disease     Past Surgical History:  Procedure Laterality Date   CATARACT EXTRACTION     COLONOSCOPY     COLONOSCOPY N/A 09/15/2020   Procedure: COLONOSCOPY;  Surgeon: Samantha Lamar HERO, MD;  Location: [facility];  Service: Endoscopy;  Laterality: N/A;  ASA II / PM procedure   POLYPECTOMY  09/15/2020   Procedure: POLYPECTOMY;  Surgeon: Samantha Lamar HERO, MD;  Location: [facility];  Service: Endoscopy;;   TUBAL LIGATION       HPI  from the history and physical done on the day of admission:   HPI: Samantha [name] is a 78 y.o. female with medical history significant for hypothyroidism, paroxysmal atrial fibrillation, HLD, DM2, and CKD 3B who was discharged from [facility] on 02/12/2023 after treatment for DKA and A-fib with RVR in the setting of uncontrolled hyperthyroidism presented to the ED with palpitations and found to be in A-fib  with RVR with heart rate close to 160 bpm--- patient was initially started on Cardizem  drip but developed hypotension subsequently transition to IV amiodarone  drip -No fever  Or chills  -- Additional history obtained from patient's daughter at bedside No fever  Or chills    No Nausea or Vomiting  -Patient reports episodes of diarrhea on 02/15/2024 - No URI symptoms,  - No Pleuritic symptoms -- EKG A-fib with RVR heart rate 162 -Troponin 12 repeat troponin 11 - Chest x-ray mild cardiomegaly suspect atelectasis - Potassium 3.2, bicarb 21, sodium 136, BUN 17, creatinine 1.23 which is close to creatinine from 02/10/2024 - Anion gap 13, glucose 189 - Magnesium  is 1.6, LFTs are not elevated  TSH-less than 0.100 - Free T4 was elevated at 2.68 on 02/09/2024 (repeat free T4 pending)    Review of Systems: As mentioned in the history of present illness. All other systems reviewed and are negative.     Hospital Course:   1) A-fib with RVR in the setting of uncontrolled hyperthyroidism-- -EKG A-fib with RVR heart rate 162 - Hypokalemia and hypomagnesemia noted - Echo from 12/09/2023 with EF of 60 to 65% with mild left ventricular hypertrophy, no mitral stenosis, moderate to severe TR noted no aortic stenosis --- Developed hypotension with IV Cardizem , improved with IV fluids - Rate control improved on IV amiodarone  drip - Unable to transition to oral amiodarone  given thyroid  issues - treated with Metoprolol  12.5 mg every 8 hours, okay to discharge on metoprolol  25 mg twice daily for rate control-  - Continue Eliquis  for stroke prophylaxis - EDP discussed with on-call cardiologist Dr. Debera --- Patient needs much better control of her thyroid  issues in order to prevent worsening A-fib with RVR episodes   2)Uncontrolled Hyperthyroidism--TSH-less than 0.100 - Free T4 was elevated at 2.68 on 02/09/2024 (repeat free T4 pending) -- Changed to Tapazole  to 10 mg bid, from once daily - Metoprolol  as above #1 - Patient will need to follow-up with Samantha [name]-: Endocrinology ---- Patient needs much better control of her thyroid  issues in order to prevent worsening A-fib with RVR episodes   3)DM2- A1C--13.1 Reflecting uncontrolled DM with persistent hyperglycemia -Restart long-acting insulin  -Patient was recently on steroid therapy which led to persistent hyperglycemia --Follow-up with Samantha [name] for adjustments of insulin  regimen   4) hypokalemia/hypomagnesemia--- had episode of diarrhea 02/15/2024 - Patient received additional magnesium  and potassium replacement goal is to keep potassium around 4 and keep magnesium  around 2   5)CKD stage -3B    - Creatinine currently at baseline - renally adjust medications, avoid nephrotoxic agents / dehydration  /  hypotension    6) chronic anemia of CKD----stable with Hgb close to 10 - No bleeding concerns -Continue iron supplementation and folic acid  - Monitor closely while on Eliquis    7)GERD--- continue Protonix    Discharge Condition: stable  Follow UP   Follow-up Information     Lenis [name] ORN, MD Follow up in 1 day(s).   Specialty: Endocrinology Contact information: [address] [place] [address] [phone]                 Consults obtained -cardiology Dr. Debera per EDP  Diet and Activity recommendation:  As advised  Discharge Instructions    Discharge Instructions     Call MD for:  difficulty breathing, headache or visual disturbances   Complete by: As directed    Call MD for:  persistant dizziness or light-headedness   Complete by: As  directed    Call MD for:  persistant nausea and vomiting   Complete by: As directed    Call MD for:  temperature >100.4   Complete by: As directed    Diet - low sodium heart healthy   Complete by: As directed    Diet Carb Modified   Complete by: As directed    Discharge instructions   Complete by: As directed    1)Watch for bleeding while on Blood Thinners--watch for blood in your stool which can make your stool black, maroon, mahogany or red---, blood in your urine which can make your urine pink or red, nosebleeds , also watch for possible bruising -You are taking Apixaban /Eliquis --- which is a blood thinner--- be careful to avoid injury or falls  2)Avoid ibuprofen /Advil /Aleve /Motrin Samantha [name]/Naproxen /BC [name]/Meloxicam/Diclofenac /Indomethacin and other Nonsteroidal anti-inflammatory medications as these will make you more likely to bleed and can cause stomach ulcers, can also cause Kidney problems.   3) follow up with Endocrinologist Dr. Ethelle Earl, MD, Endocrinology, Diabetes & [facility] Monday, 02/18/2024 at [facility], [address] [place], [place] [address], Phone Number-  tel:[phone]  4)Please Note that has been several changes to her medications   Increase activity slowly   Complete by: As directed          Discharge Medications     Allergies as of 02/17/2024   No Known Allergies      Medication List     TAKE these medications    apixaban  5 MG Tabs tablet Commonly known as: ELIQUIS  Take 1 tablet (5 mg total) by mouth 2 (two) times daily.   ferrous sulfate  325 (65 FE) MG tablet Take 1 tablet (325 mg total) by mouth every Tuesday, Thursday, Saturday, and Sunday. What changed: when to take this   folic acid  1 MG tablet Commonly known as: FOLVITE  Take 1 tablet (1 mg total) by mouth daily. What changed: when to take this   Lantus  SoloStar 100 UNIT/ML Solostar Pen Generic drug: insulin  glargine Inject 30 Units into the skin daily.   methimazole  10 MG tablet Commonly known as: TAPAZOLE  Take 1 tablet (10 mg total) by mouth 2 (two) times daily. What changed: when to take this Notes to patient: FOR THYROID    metoprolol  tartrate 25 MG tablet Commonly known as: LOPRESSOR  Take 1 tablet (25 mg total) by mouth 2 (two) times daily.   pantoprazole  40 MG tablet Commonly known as: PROTONIX  Take 1 tablet (40 mg total) by mouth daily.   rosuvastatin  5 MG tablet Commonly known as: CRESTOR  Take 1 tablet (5 mg total) by mouth every Monday, Wednesday, and Friday. Start taking on: February 18, 2024        Major procedures and Radiology Reports - PLEASE review detailed and final reports for all details, in brief -   DG Chest Port 1 View Result Date: 02/16/2024 CLINICAL DATA:  Chest pain EXAM: PORTABLE CHEST 1 VIEW COMPARISON:  February 08, 2024 FINDINGS: Patchy and streaky bibasilar opacities, possibly representing atelectasis. No pleural effusions. No pneumothorax. Prominent cardiomediastinal silhouette, likely exaggerated by AP technique. No acute osseous findings. IMPRESSION: Patchy bibasilar opacities, favored to represent atelectasis,  although infection is difficult to completely exclude. Mild cardiomegaly. Electronically Signed   By: Michaeline Blanch M.D.   On: 02/16/2024 11:32   DG Chest 2 View Result Date: 02/08/2024 CLINICAL DATA:  Abnormal EKG fatigue EXAM: CHEST - 2 VIEW COMPARISON:  Chest CT and x-ray 12/07/2023 FINDINGS: The heart size and mediastinal contours are within normal limits. Both  lungs are clear. Multilevel degenerative osteophytes. Atelectasis or scarring at the bases. IMPRESSION: No active cardiopulmonary disease. Electronically Signed   By: Luke Bun M.D.   On: 02/08/2024 20:43   US  Venous Img Lower Unilateral Left (DVT) Result Date: 01/22/2024 CLINICAL DATA:  Left leg pain EXAM: LEFT LOWER EXTREMITY VENOUS DOPPLER ULTRASOUND TECHNIQUE: Gray-scale sonography with graded compression, as well as color Doppler and duplex ultrasound were performed to evaluate the lower extremity deep venous systems from the level of the common femoral vein and including the common femoral, femoral, profunda femoral, popliteal and calf veins including the posterior tibial, peroneal and gastrocnemius veins when visible. The superficial great saphenous vein was also interrogated. Spectral Doppler was utilized to evaluate flow at rest and with distal augmentation maneuvers in the common femoral, femoral and popliteal veins. COMPARISON:  None Available. FINDINGS: Contralateral Common Femoral Vein: Respiratory phasicity is normal and symmetric with the symptomatic side. No evidence of thrombus. Normal compressibility. Common Femoral Vein: No evidence of thrombus. Normal compressibility, respiratory phasicity and response to augmentation. Saphenofemoral Junction: No evidence of thrombus. Normal compressibility and flow on color Doppler imaging. Profunda Femoral Vein: No evidence of thrombus. Normal compressibility and flow on color Doppler imaging. Femoral Vein: No evidence of thrombus. Normal compressibility, respiratory phasicity and response  to augmentation. Popliteal Vein: No evidence of thrombus. Normal compressibility, respiratory phasicity and response to augmentation. Calf Veins: Limited assessment of the calf veins. Posterior tibial vein is intermittently visualized and appears patent. Peroneal vein not visualized. Superficial Great Saphenous Vein: No evidence of thrombus. Normal compressibility. IMPRESSION: No significant left lower extremity femoropopliteal DVT. Limited assessment of the calf veins. Electronically Signed   By: CHRISTELLA.  Shick M.D.   On: 01/22/2024 15:50    Micro Results  Recent Results (from the past 240 hours)  MRSA Next Gen by PCR, Nasal     Status: None   Collection Time: 02/16/24  1:49 PM   Specimen: Nasal Mucosa; Nasal Swab  Result Value Ref Range Status   MRSA by PCR Next Gen NOT DETECTED NOT DETECTED Final    Comment: (NOTE) The GeneXpert MRSA Assay (FDA approved for NASAL specimens only), is one component of a comprehensive MRSA colonization surveillance program. It is not intended to diagnose MRSA infection nor to guide or monitor treatment for MRSA infections. Test performance is not FDA approved in patients less than 43 years old. Performed at [facility], [address], [place], [place] [address]    Today   Subjective    Samantha [name] today has no new complaints - Currently in sinus rhythm - Ambulated around the unit heart rate 70 to 80s, no dyspnea no palpitations no dizziness no chest pains - --- Patient needs much better control of her thyroid  issues in order to prevent worsening A-fib with RVR episodes   Patient has been seen and examined prior to discharge   Objective   Blood pressure 134/81, pulse 86, temperature 98.5 F (36.9 C), temperature source Oral, resp. rate 15, height 5' 1 (1.549 m), weight 66 kg, SpO2 100%.   Intake/Output Summary (Last 24 hours) at 02/17/2024 1234 Last data filed at 02/17/2024 1033 Gross per 24 hour  Intake 2217.26 ml  Output 800 ml  Net 1417.26 ml     Exam Gen:- Awake Alert, no acute distress  HEENT:- [place].AT, No sclera icterus Neck-Supple Neck,No JVD,.  Lungs-  CTAB , good air movement bilaterally CV- S1, S2 normal, regular Abd-  +ve B.Sounds, Abd Soft, No tenderness,  Extremity/Skin:- No  edema,   good pulses Psych-affect is appropriate, oriented x3 Neuro-no new focal deficits, no tremors    Data Review   CBC w Diff:  Lab Results  Component Value Date   WBC 7.2 02/16/2024   HGB 9.8 (L) 02/16/2024   HGB 8.5 (L) 03/28/2023   HCT 34.1 (L) 02/16/2024   HCT 28.9 (L) 03/28/2023   PLT 431 (H) 02/16/2024   PLT 561 (H) 03/28/2023   LYMPHOPCT 28 02/10/2024   MONOPCT 10 02/10/2024   EOSPCT 0 02/10/2024   BASOPCT 0 02/10/2024    CMP:  Lab Results  Component Value Date   NA 136 02/17/2024   NA 142 09/12/2023   K 4.5 02/17/2024   CL 111 02/17/2024   CO2 20 (L) 02/17/2024   BUN 15 02/17/2024   BUN 17 09/12/2023   CREATININE 1.17 (H) 02/17/2024   PROT 6.5 02/16/2024   PROT 6.8 09/12/2023   ALBUMIN 2.1 (L) 02/17/2024   ALBUMIN 4.3 09/12/2023   BILITOT 0.9 02/16/2024   BILITOT 0.3 09/12/2023   ALKPHOS 75 02/16/2024   AST 14 (L) 02/16/2024   ALT 10 02/16/2024  .  Total Discharge time is about 33 minutes  Samantha [name] M.D on 02/17/2024 at 12:34 PM  Go to [email or web address] -  for contact info  [facility] - Office  [phone]

## 2024-02-17 NOTE — Care Management Obs Status (Signed)
 MEDICARE OBSERVATION STATUS NOTIFICATION   Patient Details  Name: Samantha [name] MRN: [id number] Date of Birth: [birth year]   Medicare Observation Status Notification Given:  Yes    Nena LITTIE Coffee, RN 02/17/2024, 11:03 AM

## 2024-02-17 NOTE — Progress Notes (Addendum)
 Patient discharged home. Daughter at bedside. Patient and daughter verbalized understanding of discharge instructions.

## 2024-02-17 NOTE — Progress Notes (Signed)
 Patient ambulated around unit with standby-assist. Heart rate remained 80s, normal sinus rhythm.

## 2024-02-18 ENCOUNTER — Ambulatory Visit (INDEPENDENT_AMBULATORY_CARE_PROVIDER_SITE_OTHER): Admitting: "Endocrinology

## 2024-02-18 ENCOUNTER — Encounter: Payer: Self-pay | Admitting: "Endocrinology

## 2024-02-18 VITALS — BP 144/78 | HR 92 | Ht 61.0 in | Wt 144.8 lb

## 2024-02-18 DIAGNOSIS — E1165 Type 2 diabetes mellitus with hyperglycemia: Secondary | ICD-10-CM | POA: Diagnosis not present

## 2024-02-18 DIAGNOSIS — E119 Type 2 diabetes mellitus without complications: Secondary | ICD-10-CM

## 2024-02-18 DIAGNOSIS — E0590 Thyrotoxicosis, unspecified without thyrotoxic crisis or storm: Secondary | ICD-10-CM | POA: Diagnosis not present

## 2024-02-18 DIAGNOSIS — Z794 Long term (current) use of insulin: Secondary | ICD-10-CM

## 2024-02-18 DIAGNOSIS — I10 Essential (primary) hypertension: Secondary | ICD-10-CM | POA: Diagnosis not present

## 2024-02-18 DIAGNOSIS — E782 Mixed hyperlipidemia: Secondary | ICD-10-CM

## 2024-02-18 MED ORDER — INSULIN GLARGINE 100 UNIT/ML SOLOSTAR PEN: 40.0000 [IU] | PEN_INJECTOR | Freq: Every day | SUBCUTANEOUS | 1 refills | Status: AC

## 2024-02-18 MED ORDER — METFORMIN HCL ER 500 MG PO TB24: 500.0000 mg | ORAL_TABLET | Freq: Every day | ORAL | 1 refills | Status: AC

## 2024-02-18 MED ORDER — EMPAGLIFLOZIN 10 MG PO TABS: 10.0000 mg | ORAL_TABLET | Freq: Every day | ORAL | 1 refills | Status: DC

## 2024-02-18 NOTE — Patient Instructions (Signed)

## 2024-02-18 NOTE — Progress Notes (Signed)
 02/18/2024, 2:55 PM   Endocrinology follow-up note  Subjective:    Patient ID: Samantha [name], female    DOB: 06-10-1946, PCP Campbell Reynolds, NP   Past Medical History:  Diagnosis Date   Allergic rhinitis    Arthritis of both knees    Arthritis of both shoulder regions    Bilateral carpal tunnel syndrome    Cataract    Chronic low back pain    CKD (chronic kidney disease), stage III (HCC)    Decreased vision    Diabetes mellitus    HTN (hypertension)    Hyperlipidemia    Morbid obesity (HCC)    Thyroid  disease    Past Surgical History:  Procedure Laterality Date   CATARACT EXTRACTION     COLONOSCOPY     COLONOSCOPY N/A 09/15/2020   Procedure: COLONOSCOPY;  Surgeon: Shaaron Lamar HERO, MD;  Location: [facility];  Service: Endoscopy;  Laterality: N/A;  ASA II / PM procedure   POLYPECTOMY  09/15/2020   Procedure: POLYPECTOMY;  Surgeon: Shaaron Lamar HERO, MD;  Location: [facility];  Service: Endoscopy;;   TUBAL LIGATION     Social History   Socioeconomic History   Marital status: Widowed    Spouse name: Not on file   Number of children: Not on file   Years of education: Not on file   Highest education level: Not on file  Occupational History   Not on file  Tobacco Use   Smoking status: Never   Smokeless tobacco: Never  Vaping Use   Vaping status: Never Used  Substance and Sexual Activity   Alcohol use: Yes    Comment: occasionally   Drug use: No   Sexual activity: Yes    Birth control/protection: Surgical    Comment: tubal  Other Topics Concern   Not on file  Social History Narrative   Not on file   Social Drivers of Health   Financial Resource Strain: Low Risk  (11/16/2022)   Overall Financial Resource Strain (CARDIA)    Difficulty of Paying Living Expenses: Not very hard  Food Insecurity: No Food Insecurity (02/16/2024)   Hunger Vital Sign    Worried About Running Out of Food in the Last Year: Never true     Ran Out of Food in the Last Year: Never true  Transportation Needs: No Transportation Needs (02/16/2024)   PRAPARE - Administrator, Civil Service (Medical): No    Lack of Transportation (Non-Medical): No  Physical Activity: Inactive (11/16/2022)   Exercise Vital Sign    Days of Exercise per Week: 0 days    Minutes of Exercise per Session: 0 min  Stress: No Stress Concern Present (11/16/2022)   [employer] of Occupational Health - Occupational Stress Questionnaire    Feeling of Stress : Only a little  Social Connections: Moderately Integrated (02/16/2024)   Social Connection and Isolation Panel    Frequency of Communication with Friends and Family: Three times a week    Frequency of Social Gatherings with Friends and Family: Three times a week    Attends Religious Services: More than 4 times per year    Active Member of Clubs or Organizations: Yes  Attends Banker Meetings: More than 4 times per year    Marital Status: Widowed   Family History  Problem Relation Age of Onset   Hypertension Father    Hypertension Mother    Diabetes Mother    Breast cancer Mother    Hypertension Sister    Cancer Sister        pancreatic   Cancer Sister        pancreatic   Liver disease Sister    Outpatient Encounter Medications as of 02/18/2024  Medication Sig   empagliflozin  (JARDIANCE ) 10 MG TABS tablet Take 1 tablet (10 mg total) by mouth daily before breakfast.   metFORMIN  (GLUCOPHAGE -XR) 500 MG 24 hr tablet Take 1 tablet (500 mg total) by mouth daily with breakfast.   valsartan -hydrochlorothiazide (DIOVAN -HCT) 320-12.5 MG tablet Take 1 tablet by mouth daily.   apixaban  (ELIQUIS ) 5 MG TABS tablet Take 1 tablet (5 mg total) by mouth 2 (two) times daily.   ferrous sulfate  325 (65 FE) MG tablet Take 1 tablet (325 mg total) by mouth every Tuesday, Thursday, Saturday, and Sunday.   folic acid  (FOLVITE ) 1 MG tablet Take 1 tablet (1 mg total) by mouth daily.   insulin   glargine (LANTUS ) 100 UNIT/ML Solostar Pen Inject 40 Units into the skin at bedtime.   methimazole  (TAPAZOLE ) 10 MG tablet Take 1 tablet (10 mg total) by mouth 2 (two) times daily.   metoprolol  tartrate (LOPRESSOR ) 25 MG tablet Take 1 tablet (25 mg total) by mouth 2 (two) times daily.   pantoprazole  (PROTONIX ) 40 MG tablet Take 1 tablet (40 mg total) by mouth daily.   rosuvastatin  (CRESTOR ) 5 MG tablet Take 1 tablet (5 mg total) by mouth every Monday, Wednesday, and Friday.   [DISCONTINUED] insulin  glargine (LANTUS ) 100 UNIT/ML Solostar Pen Inject 30 Units into the skin daily.   No facility-administered encounter medications on file as of 02/18/2024.   ALLERGIES: No Known Allergies   VACCINATION STATUS: Immunization History  Administered Date(s) Administered   H1N1 06/30/2008   Influenza Whole 03/12/2006, 06/03/2007   Moderna Sars-Covid-2 Vaccination 07/20/2019, 08/20/2019   Pneumococcal Polysaccharide-23 01/22/2007    HPI KEYARA [name] is 78 y.o. female who presents today with a medical history as above. she is being seen in follow-up after she was seen in consultation for hyperthyroidism requested by Campbell Reynolds, NP.  She is accompanied by her daughter to clinic today. She was diagnosed with primary hyperthyroidism based on thyroid  uptake and scan in May 2024.  After reviewing her options, incretin is doing a 59.4% 24-hour uptake showing enlarged multinodular thyroid , she was treated with radioactive iodine treatment on November 28, 2022 with 20 mCi of I-131 on November 28, 2022.  Her subsequent thyroid  function tests did not confirm treatment response.  Her thyroid  function tests results never return to normal nor drop below normal.    That necessitated initiation of methimazole  treatment, methimazole  10 mg p.o. daily.  Her thyroid  function test are only slightly improving.  During her last visit on January 17, 2024, I had a discussion with her that she will need repeat thyroid  uptake and scan in  preparation for repeat radioactive iodine thyroid  ablation.  However, she need to be off of methimazole  for 5 days for the scan. Unfortunately, patient was not reachable by nuclear medicine.She states that she was probably in the hospital for diabetes ketoacidosis and of August 2025. She was found to have A1c of 13.1%.  After treatment with insulin  in the hospital,  she was discharged on Lantus  30 units daily.  She responded partially, presents with Dexcom showing average blood glucose of 205 mg per DL for the most recent 1 week. -More recently, she visited ER and was found to have atrial fibrillation.  At that point her free T4 was high at 3.71 and TSH was fully suppressed. -She cannot confirm to me if she was consistent on taking her methimazole  10 mg p.o. twice daily before the ER visit on February 16, 2024.   She denies dysphagia, shortness of breath, nor voice change.  She denies feeling palpitations, tremor, nor heat intolerance.  She denies family history of thyroid  problems.   She does not have  family history of thyroid  malignancy.  She presents with minimally fluctuating body weight.   Her other medical problems include uncontrolled hypertension on treatment with amlodipine  and lisinopril.  She remains on metformin  500 mg p.o. once a day with breakfast for type 2 diabetes with recent A1c around 7%.     Review of Systems  Constitutional: +mildly fluctuating body weight with recent weight gain, no fatigue, no subjective hyperthermia, no subjective hypothermia Eyes: no blurry vision, no xerophthalmia  Neurological: no tremors, no numbness, no tingling, no dizziness Psychiatric: no depression,  Objective:       02/18/2024    1:29 PM 02/17/2024   12:00 PM 02/17/2024   11:30 AM  Vitals with BMI  Height 5' 1    Weight 144 lbs 13 oz    BMI 27.37    Systolic 144 134 860  Diastolic 78 81 81  Pulse 92      BP (!) 144/78   Pulse 92   Ht 5' 1 (1.549 m)   Wt 144 lb 12.8 oz (65.7 kg)    BMI 27.36 kg/m   Wt Readings from Last 3 Encounters:  02/18/24 144 lb 12.8 oz (65.7 kg)  02/16/24 145 lb 9.6 oz (66 kg)  02/09/24 145 lb 4.5 oz (65.9 kg)    Physical Exam  Constitutional:  Body mass index is 27.36 kg/m.,  not in acute distress, + anxious state of mind.  She ambulates with a walker due to disequilibrium. Eyes: PERRLA, EOMI, no exophthalmos    CMP ( most recent) CMP     Component Value Date/Time   NA 136 02/17/2024 0534   NA 142 09/12/2023 1627   K 4.5 02/17/2024 0534   CL 111 02/17/2024 0534   CO2 20 (L) 02/17/2024 0534   GLUCOSE 118 (H) 02/17/2024 0534   BUN 15 02/17/2024 0534   BUN 17 09/12/2023 1627   CREATININE 1.17 (H) 02/17/2024 0534   CALCIUM  8.6 (L) 02/17/2024 0534   PROT 6.5 02/16/2024 1110   PROT 6.8 09/12/2023 1627   ALBUMIN 2.1 (L) 02/17/2024 0534   ALBUMIN 4.3 09/12/2023 1627   AST 14 (L) 02/16/2024 1110   ALT 10 02/16/2024 1110   ALKPHOS 75 02/16/2024 1110   BILITOT 0.9 02/16/2024 1110   BILITOT 0.3 09/12/2023 1627   GFRNONAA 48 (L) 02/17/2024 0534   GFRAA 55 (L) 05/08/2018 2247     Diabetic Labs (most recent): Lab Results  Component Value Date   HGBA1C 13.1 (H) 02/08/2024   HGBA1C 6.6 (H) 12/08/2023   HGBA1C 7.1 (H) 03/08/2023   MICROALBUR 1.54 02/09/2009   MICROALBUR 0.64 01/16/2008   MICROALBUR 1.69 12/03/2006     Lipid Panel ( most recent) Lipid Panel     Component Value Date/Time   CHOL 283 (H) 01/15/2024 1343  TRIG 74 01/15/2024 1343   HDL 128 01/15/2024 1343   CHOLHDL 2.2 01/15/2024 1343   CHOLHDL 2.8 Ratio 02/09/2009 2026   VLDL 17 02/09/2009 2026   LDLCALC 144 (H) 01/15/2024 1343   LABVLDL 11 01/15/2024 1343      Lab Results  Component Value Date   TSH <0.100 (L) 02/16/2024   TSH <0.100 (L) 02/09/2024   TSH 0.036 (L) 01/15/2024   TSH 0.017 (L) 12/07/2023   TSH 0.150 (L) 09/12/2023   FREET4 3.71 (H) 02/16/2024   FREET4 2.68 (H) 02/09/2024   FREET4 1.90 (H) 01/15/2024   FREET4 2.72 (H) 12/07/2023    FREET4 1.57 09/12/2023     Thyroid  uptake and scan on Oct 20, 2022 FINDINGS: Unremarkable LEFT thyroid  lobe.   Enlarged RIGHT thyroid  lobe containing warm and cold nodules.   Patient has previously undergone biopsy of an indeterminate nodule in the RIGHT lobe.   4 hour I-123 uptake = 29.9% (normal 5-20%), 24 hour I-123 uptake = 59.4% (normal 10-30%)   IMPRESSION: Enlarged multinodular thyroid  gland especially RIGHT lobe. Elevated 4 hour and 24 hour radio iodine uptakes consistent with hyperthyroidism.  Nuclear medicine radioactive iodine therapy for hyperthyroidism on November 28, 2022 IMPRESSION: Per oral administration of I-131 sodium iodide for the treatment of hyperthyroidism.  20 mCi of I-131.    Assessment & Plan:   1.  Hyperthyroidism 2.  Toxic multinodular goiter 3.  Type 2 diabetes  -After appropriate workup which showed primary hyperthyroidism as a result of toxic multinodular goiter, she was treated with radioactive iodine treatment on November 28, 2022.  Her subsequent thyroid  function tests were consistent with likely treatment failure.    This patient will likely need repeat treatment with radioactive iodine ablation after pretreatment uptake and scan. She continues to have paucity of symptoms, however.  Good biochemical evidence of thyrotoxicosis which need to be controlled before treatment was drawn through the scan.   In light of her recent complication with atrial fibrillation, she is advised to continue with the recently increased dose of methimazole  10 mg p.o. twice daily-daily with breakfast and supper.  She is also advised to continue her metoprolol  25 mg p.o. twice daily. Complications of untreated hyperthyroidism were discussed in detail with her including the risk of thyroid  storm.   - She also had new concern of severely uncontrolled diabetes which led to diabetic ketoacidosis recently. She has responded to basal insulin  after discharge.  I discussed and  increase her Lantus  to 40 units nightly, restarted her metformin  500 mg XR p.o. daily at breakfast.  I also discussed and added Jardiance  10 mg p.o. daily at breakfast. She is advised to continue to use her Dexcom CGM. She will call clinic for hypoglycemia under 70 or hyperglycemia 200 mg per DL weekly average.   - Suggestion is made for her to avoid simple carbohydrates  from her diet including Cakes, Sweet Desserts, Ice Cream, Soda (diet and regular), Sweet Tea, Candies, Chips, Cookies, Store Bought Juices, Alcohol , Artificial Sweeteners,  Coffee Creamer, and Sugar-free Products, Lemonade. This will help patient to have more stable blood glucose profile and potentially avoid unintended weight gain.   -Regarding her hypertension: She is currently on valsartan -320-12.5 mg p.o. daily, spironolactone  25 mg p.o. daily, metoprolol  100 mg p.o. twice daily.  Her blood pressure still not controlled.  There is a question of compliance with medications in this patient.       - she is advised to maintain close follow up with  Campbell Reynolds, NP for primary care needs.   I spent  26  minutes in the care of the patient today including review of labs from Thyroid  Function, CMP, and other relevant labs ; imaging/biopsy records (current and previous including abstractions from other facilities); face-to-face time discussing  her lab results and symptoms, medications doses, her options of short and long term treatment based on the latest standards of care / guidelines;   and documenting the encounter.  Samantha [name]  participated in the discussions, expressed understanding, and voiced agreement with the above plans.  All questions were answered to her satisfaction. she is encouraged to contact clinic should she have any questions or concerns prior to her return visit.   Follow up plan: Return in about 5 weeks (around 03/24/2024) for F/U with Pre-visit Labs.   Ranny Earl, MD [facility]  [facility] [address] [place], [place] [address] Phone: [phone]  Fax: [phone]     02/18/2024, 2:55 PM  This note was partially dictated with voice recognition software. Similar sounding words can be transcribed inadequately or may not  be corrected upon review.

## 2024-02-21 ENCOUNTER — Telehealth: Payer: Self-pay

## 2024-02-21 ENCOUNTER — Other Ambulatory Visit: Payer: Self-pay | Admitting: Cardiology

## 2024-02-21 NOTE — Telephone Encounter (Signed)
 Pt called stating she has experienced hypoglycemic episode. Pt is taking Lantus  40 units daily and Metformin  500mg  daily. Uploaded & printed pt's Dexcom CGM data. Dr.Nida evaluated CGM data which did not show hypoglycemia. Pt also stated she sleeps in late and does not eat until around 12:00pm. Discussed with pt the importance of eating 3 well balanced meals through the day beginning with breakfast between 6-8am. Pt voiced understanding. Pt advised to increase Lantus  to 50 units at bedtime per Dr.Nida according to results of CGM data. Pt voiced understanding.

## 2024-02-26 ENCOUNTER — Telehealth: Payer: Self-pay

## 2024-02-26 NOTE — Telephone Encounter (Signed)
 Tried to return pt's call, left a message requesting pt return call to the office.

## 2024-02-26 NOTE — Telephone Encounter (Signed)
 Pt requested a referral for dietician.

## 2024-02-27 NOTE — Telephone Encounter (Signed)
 Pt left a message up front for Dr. Barbette Nurse

## 2024-02-27 NOTE — Telephone Encounter (Signed)
 Left a message requesting pt return call to the office.

## 2024-02-28 ENCOUNTER — Other Ambulatory Visit: Payer: Self-pay

## 2024-02-28 ENCOUNTER — Telehealth: Payer: Self-pay

## 2024-02-28 NOTE — Telephone Encounter (Signed)
 Left a message requesting pt return call to the office.

## 2024-02-29 ENCOUNTER — Telehealth: Payer: Self-pay

## 2024-02-29 NOTE — Telephone Encounter (Signed)
 Pt called stating she is experiencing hypoglycemic episodes throughout the night. Pt states she is injecting Lantus  50 units at bedtime, taking Metformin  500mg  daily and Humalog  2-10 units TIDAC. Pt states Wednesday evening she injected 40 units of Lantus  at bedtime. Pt's CGM data uploaded, printed and given to Dr.Nida for evaluation. Dr.Nida evaluated pt's CGM data. Advised pt to continue to inject the reduce dose of Lantus  40 units at bedtime, continue the Metformin  500mg  daily and Humalog  2-10 units TIDAC per Dr.Nida's orders. Pt voiced understanding.

## 2024-02-29 NOTE — Telephone Encounter (Signed)
 Spoke with pt regarding her hypoglycemic episodes. Documented in a separate encounter.

## 2024-03-17 NOTE — Progress Notes (Signed)
 Cardiology Office Note   Date:  03/26/2024  ID:  Terese, [name] 10-20-1945, MRN [id number] PCP: Campbell Reynolds, NP  [facility] Providers Cardiologist:  Maude Emmer, MD Cardiology APP:  Miriam Norris, NP   History of Present Illness Samantha [name] is a 78 y.o. female with a past medical history of palpitations/SVT, paroxysmal atrial fibrillation, HTN, HLD, type 2 DM, CKD stage IIIb, hyperthyroidism s/p iodine ablation in 11/2022, chronic back pain. Patient is followed by Dr. Emmer and presents today for a hospital follow up appointment   Patient previously had a nuclear stress test in 05/2020 that was a low risk study without evidence of ischemia or infarction. 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 She was referred to endocrinology and underwent neuroactive iodine thyroid  ablation in 11/2022. She was later admitted from 8/13--02/01/23 after she presented for evaluation of altered mental status and weakness. Upon arrival to the ED, she was tachycardic and BP was intermittently soft. EKG showed sinus tachycardia. She was admitted for acute metabolic encephalopathy due to AKI and hypernatremia. She was treated with IV fluids. While admitted, patient developed atrial fibrillation with RVR. She was started on IV diltiazem  drip and was given IV lopressor . Cardiology was consulted. Found to have TSH low at 0.028 and free T4 elevated to 17.5. Echocardiogram on 01/24/23 showed EF >75%, no regional wall motion abnormalities, mild LVH, grade I DD, normal RV function. There was elevated LVOT peak velocity of 4 m/s, likely due to hyperdynamic LV function and chordal SAM. Patient converted to NSR and was discharged on eliquis  5 mg BID, metoprolol  tartrate 50 mg BID.    She was seen in the ED on 11/09/23 with right sided chest pain. Worse with inspiration, sneezing ,or movement. Chest wall was tender to palpation. D-dimer negative. hsTn negative x2.  Diagnosed with musculoskeltal chest pain, discharged from the ED. has already follow-up/12/25.  Time, patient cannot have occasional episodes of right-sided chest pain.  Pain was localized to 1 spot and it was reproducible patient.  Denies shortness of breath.  It was felt that chest pain was musculoskeletal in nature.   She was admitted 6/27 - 6/30 with pneumonia and presumed pericarditis.  Presented complaining of loss of appetite, fatigue, chest pain, mild cough.  In the ER, chest CT showed small left> right pleural effusion, small pericardial effusion.  High-sensitivity troponin negative x 2.  EKG showed sinus tachycardia with heart rate 105 bpm, nonspecific ST/T wave changes.  ESR and CRP were elevated.  She was started on aspirin  650 mg 3 times daily for 7 days followed by 325 mg 3 times daily for 7 days.  Also started on colchicine .  Her anticoagulation was held while she was on high-dose aspirin .  Echocardiogram 12/09/2023 showed EF 60-65%, no regional wall motion abnormalities, mild LVH, normal RV function, moderately elevated PA systolic pressure, mild MR moderate-severe TR, no evidence of pericardial effusion.  Despite compliance with her colchicine  and high-dose aspirin , her inflammatory markers remained elevated.  Was treated with prednisone .  She was admitted 8/29-02/12/2024 with DKA.  Treated with insulin  drip and transition to subcu insulin .  Found to be in atrial fibrillation with RVR.  TSH continued to be suppressed and T4 continued to be very high.  Patient had stopped her methimazole  and instructed to resume it upon discharge.  Eventually heart rate became well-controlled on Lopressor .  Patient again was admitted 9/6 - 02/17/2024 with atrial fibrillation with RVR.  She had  presented with palpitations and was found to be in A-fib with RVR with heart rate into the 160s.  Treated with Cardizem  drip but became hypotensive and was transition to amiodarone  drip.  Patient was hypokalemic and  hypomagnesemic.  Patient was ultimately discharged on metoprolol  to tartrate 25 mg twice daily and Eliquis .  Educated that she needs much better control of her thyroid  issues in order to prevent worsening A-fib with RVR.  Today, patient presents for follow-up appointment.  Reports that she has been feeling very well from a cardiac perspective.  She is continue to follow with her endocrinologist for management of her diabetes and hypothyroidism.  Was recently told that her thyroid  medication was working well.  She denies palpitations, dizziness, syncope, near syncope, fatigue.  She denies chest pain or shortness of breath.  She has not taken her blood pressure medications yet today and her BP is 170/100 in clinic.  Improved to 160/76 on my recheck.  She brought her pill bottles with her today.  Currently on metoprolol  100 mg twice daily, valsartan -hydrochlorothiazide 320-12.5 mg daily, hydralazine  100 mg twice daily.  She does not regularly check her blood pressure at home.  She has been trying to exercise more and has been tolerating physical activity well.   Studies Reviewed  Cardiac Studies & Procedures   ______________________________________________________________________________________________   STRESS TESTS  MYOCARDIAL PERFUSION IMAGING 05/27/2020  Interpretation Summary  Nuclear stress EF: 74%. The left ventricular ejection fraction is hyperdynamic (>65%).  The patient walked on a standard Bruce protocol treadmill test for 4 minutes.  She achieved a peak heart rate of 162 which is 110% predicted maximal heart rate.  At peak exercise there were no ST or T wave changes to suggest ischemia. There was no QRS widening at peak exercise.  This is a low risk study. The patient has fair exercise capacity. There is no evidence of infarction or ischemia.   ECHOCARDIOGRAM  ECHOCARDIOGRAM COMPLETE 12/09/2023  Narrative ECHOCARDIOGRAM REPORT    Patient Name:   Samantha [name] Date of Exam:  12/09/2023 Medical Rec #:  [id number]   Height:       61.0 in Accession #:    [id number]  Weight:       145.0 lb Date of Birth:  04-01-1946   BSA:          1.647 m Patient Age:    78 years    BP:           450/67 mmHg Patient Gender: F           HR:           84 bpm. Exam Location:  Inpatient  Procedure: 2D Echo, Cardiac Doppler and Color Doppler (Both Spectral and Color Flow Doppler were utilized during procedure).  Indications:    I31.3 Pericardial effusion (noninflammatory)  History:        Patient has prior history of Echocardiogram examinations, most recent 01/24/2023. Abnormal ECG, Arrythmias:Atrial Fibrillation and SVT; Risk Factors:Hypertension, Diabetes and Dyslipidemia. Pericardial effusion.  Sonographer:    Ellouise Mose RDCS Referring Phys: [id number] GRAYCE BOLD  IMPRESSIONS   1. Left ventricular ejection fraction, by estimation, is 60 to 65%. The left ventricle has normal function. The left ventricle has no regional wall motion abnormalities. There is mild left ventricular hypertrophy. Left ventricular diastolic parameters were normal. There is the interventricular septum is flattened in systole and diastole, consistent with right ventricular pressure and volume overload. 2. Right ventricular systolic function is normal. The right  ventricular size is normal. There is moderately elevated pulmonary artery systolic pressure. The estimated right ventricular systolic pressure is 48.6 mmHg. 3. The mitral valve is normal in structure. Mild mitral valve regurgitation. No evidence of mitral stenosis. 4. The tricuspid valve is abnormal. Tricuspid valve regurgitation is moderate to severe. 5. The aortic valve is tricuspid. Aortic valve regurgitation is not visualized. No aortic stenosis is present. 6. The inferior vena cava is normal in size with greater than 50% respiratory variability, suggesting right atrial pressure of 3 mmHg.  FINDINGS Left Ventricle: Left ventricular ejection  fraction, by estimation, is 60 to 65%. The left ventricle has normal function. The left ventricle has no regional wall motion abnormalities. The left ventricular internal cavity size was normal in size. There is mild left ventricular hypertrophy. The interventricular septum is flattened in systole and diastole, consistent with right ventricular pressure and volume overload. Left ventricular diastolic parameters were normal.  Right Ventricle: The right ventricular size is normal. No increase in right ventricular wall thickness. Right ventricular systolic function is normal. There is moderately elevated pulmonary artery systolic pressure. The tricuspid regurgitant velocity is 2.90 m/s, and with an assumed right atrial pressure of 15 mmHg, the estimated right ventricular systolic pressure is 48.6 mmHg.  Left Atrium: Left atrial size was normal in size.  Right Atrium: Right atrial size was normal in size.  Pericardium: There is no evidence of pericardial effusion.  Mitral Valve: The mitral valve is normal in structure. Mild mitral valve regurgitation. No evidence of mitral valve stenosis.  Tricuspid Valve: The tricuspid valve is abnormal. Tricuspid valve regurgitation is moderate to severe. No evidence of tricuspid stenosis.  Aortic Valve: The aortic valve is tricuspid. Aortic valve regurgitation is not visualized. No aortic stenosis is present. Aortic valve mean gradient measures 5.5 mmHg. Aortic valve peak gradient measures 10.3 mmHg. Aortic valve area, by VTI measures 1.68 cm.  Pulmonic Valve: The pulmonic valve was not well visualized. Pulmonic valve regurgitation is not visualized. No evidence of pulmonic stenosis.  Aorta: The aortic root and ascending aorta are structurally normal, with no evidence of dilitation.  Venous: The inferior vena cava is normal in size with greater than 50% respiratory variability, suggesting right atrial pressure of 3 mmHg.  IAS/Shunts: No atrial level shunt  detected by color flow Doppler.   LEFT VENTRICLE PLAX 2D LVIDd:         4.10 cm     Diastology LVIDs:         2.70 cm     LV e' medial:    7.72 cm/s LV PW:         1.20 cm     LV E/e' medial:  12.9 LV IVS:        1.10 cm     LV e' lateral:   10.90 cm/s LVOT diam:     1.70 cm     LV E/e' lateral: 9.2 LV SV:         52 LV SV Index:   32 LVOT Area:     2.27 cm  LV Volumes (MOD) LV vol d, MOD A2C: 92.7 ml LV vol d, MOD A4C: 88.8 ml LV vol s, MOD A2C: 35.2 ml LV vol s, MOD A4C: 31.5 ml LV SV MOD A2C:     57.5 ml LV SV MOD A4C:     88.8 ml LV SV MOD BP:      58.2 ml  RIGHT VENTRICLE  IVC RV S prime:     7.94 cm/s  IVC diam: 1.80 cm TAPSE (M-mode): 1.5 cm  LEFT ATRIUM             Index        RIGHT ATRIUM           Index LA diam:        4.00 cm 2.43 cm/m   RA Area:     14.80 cm LA Vol (A2C):   33.0 ml 20.03 ml/m  RA Volume:   40.35 ml  24.49 ml/m LA Vol (A4C):   35.1 ml 21.31 ml/m LA Biplane Vol: 35.4 ml 21.49 ml/m AORTIC VALVE AV Area (Vmax):    1.70 cm AV Area (Vmean):   1.64 cm AV Area (VTI):     1.68 cm AV Vmax:           160.42 cm/s AV Vmean:          111.048 cm/s AV VTI:            0.313 m AV Peak Grad:      10.3 mmHg AV Mean Grad:      5.5 mmHg LVOT Vmax:         120.00 cm/s LVOT Vmean:        80.400 cm/s LVOT VTI:          0.231 m LVOT/AV VTI ratio: 0.74  AORTA Ao Root diam: 2.15 cm Ao Asc diam:  3.55 cm  MITRAL VALVE               TRICUSPID VALVE MV Area (PHT): 4.07 cm    TR Peak grad:   33.6 mmHg MV Decel Time: 187 msec    TR Vmax:        290.00 cm/s MV E velocity: 99.75 cm/s MV A velocity: 65.15 cm/s  SHUNTS MV E/A ratio:  1.53        Systemic VTI:  0.23 m Systemic Diam: 1.70 cm  Dorn Ross MD Electronically signed by Dorn Ross MD Signature Date/Time: 12/09/2023/4:01:25 PM    Final          ______________________________________________________________________________________________       Risk  Assessment/Calculations  CHA2DS2-VASc Score = 5  This indicates a 7.2% annual risk of stroke. The patient's score is based upon: CHF History: 0 HTN History: 1 Diabetes History: 1 Stroke History: 0 Vascular Disease History: 0 Age Score: 2 Gender Score: 1  HYPERTENSION CONTROL Vitals:   03/26/24 1457 03/26/24 1536  BP: (!) 170/100 (!) 160/76    The patient's blood pressure is elevated above target today.  In order to address the patient's elevated BP: Blood pressure will be monitored at home to determine if medication changes need to be made.          Physical Exam VS:  BP (!) 160/76   Pulse (!) 55   Ht 5' 1 (1.549 m)   Wt 150 lb 12.8 oz (68.4 kg)   SpO2 98%   BMI 28.49 kg/m        Wt Readings from Last 3 Encounters:  03/26/24 150 lb 12.8 oz (68.4 kg)  03/24/24 153 lb 3.2 oz (69.5 kg)  02/18/24 144 lb 12.8 oz (65.7 kg)    GEN: Well nourished, well developed in no acute distress.  Sitting comfortably in the chair NECK: No JVD; No carotid bruits CARDIAC:  RRR.  Faint systolic murmur RESPIRATORY:  Clear to auscultation without rales, wheezing or rhonchi.  Normal work of  breathing on room air ABDOMEN: Soft, non-tender, non-distended EXTREMITIES:  No edema bilateral lower extremities; No deformity   ASSESSMENT AND PLAN  PAF  - Patient first diagnosed with PAF in 01/2023, had recurrence in 02/2023. Note, episodes occurred in the setting of uncontrolled hyperthyroidism  - Again had recurrence of atrial fibrillation and 02/2024 in the setting of poorly controlled hyperthyroidism.  Free T4 elevated to 3.71 on 9/6. She was treated with IV amiodarone  while admitted, but was not discharged on this due to her thyroid  issues.  -Patient denies palpitations.  She was discharged with instructions to take metoprolol  tartrate 25 mg twice daily.  However, she tells me that this prescription was not sent in and she has been taking metoprolol  tartrate 100 mg twice daily.  She has been  tolerating this dose well.  Heart rate is in the 50s today but she denies dizziness, syncope or near syncope. No symptoms at home either  - EKG today shows sinus bradycardia  -As patient can get tachycardic when in atrial fibrillation, I think it is reasonable continue with metoprolol  tartrate 100 mg twice daily. Especially as she has already been tolerating this dose well - Continue eliquis  5 mg BID.  Reports good compliance with blood thinner and denies bleeding     HTN  - BP elevated today in clinic though patient has not taken her medications today.  She tells me that she got busy this morning and forgot to take them.  She does not check her blood pressure at home regularly. - Patient currently on hydralazine  100 mg twice daily, metoprolol  tartrate 100 mg twice daily, valsartan -hydrochlorothiazide 320-12.5 mg daily.  I am hesitant to add/adjust blood pressure medications as she is already on high doses of multiple medications.  Instructed patient to keep a BP log for 2 weeks and return via MyChart.  Will make further med adjustments based on home readings  - K4.5, creatinine 1.17 on 9/7  Mild MR  Moderate-severe Tricuspid valve regurgitation  - Noted on echocardiogram 11/2023  - Euvolemic on exam today. No shortness of breath  - Anticipate repeat echo in 2 years for monitoring, or as symptoms arise   Hyperthyroidism  - Followed by endocrinology. Previously had neuroactive iodine thyroid  ablation in 11/2022. Considering repeat RAI treatment. Currently on methimazole   - TSH 14.5 and free T4 0.62 on 10/10.    CKD stage IIIb - Followed by PCP and recently referred to nephrology.  On Jardiance  10 mg daily - Creatine 1.17 on 9/7   History of pericarditis - Diagnosed with pericarditis in 11/2023.  Completed treatment of colchicine , prednisone  - Patient denies chest pain   Dispo: Follow up with Dr. Delford in 4 months   Signed, Rollo FABIENE Louder, PA-C

## 2024-03-22 LAB — CBC WITH DIFFERENTIAL/PLATELET
Basophils Absolute: 0.1 x10E3/uL (ref 0.0–0.2)
Basos: 1 %
EOS (ABSOLUTE): 0.2 x10E3/uL (ref 0.0–0.4)
Eos: 2 %
Hematocrit: 34.9 % (ref 34.0–46.6)
Hemoglobin: 10.2 g/dL — ABNORMAL LOW (ref 11.1–15.9)
Immature Grans (Abs): 0 x10E3/uL (ref 0.0–0.1)
Immature Granulocytes: 0 %
Lymphocytes Absolute: 2 x10E3/uL (ref 0.7–3.1)
Lymphs: 28 %
MCH: 23.8 pg — ABNORMAL LOW (ref 26.6–33.0)
MCHC: 29.2 g/dL — ABNORMAL LOW (ref 31.5–35.7)
MCV: 82 fL (ref 79–97)
Monocytes Absolute: 0.4 x10E3/uL (ref 0.1–0.9)
Monocytes: 6 %
Neutrophils Absolute: 4.3 x10E3/uL (ref 1.4–7.0)
Neutrophils: 62 %
Platelets: 445 x10E3/uL (ref 150–450)
RBC: 4.28 x10E6/uL (ref 3.77–5.28)
RDW: 17.4 % — ABNORMAL HIGH (ref 11.7–15.4)
WBC: 7 x10E3/uL (ref 3.4–10.8)

## 2024-03-22 LAB — TSH: TSH: 14.5 u[IU]/mL — ABNORMAL HIGH (ref 0.450–4.500)

## 2024-03-22 LAB — T4, FREE: Free T4: 0.62 ng/dL — ABNORMAL LOW (ref 0.82–1.77)

## 2024-03-22 LAB — T3: T3, Total: 65 ng/dL — ABNORMAL LOW (ref 71–180)

## 2024-03-24 ENCOUNTER — Encounter: Payer: Self-pay | Admitting: "Endocrinology

## 2024-03-24 ENCOUNTER — Ambulatory Visit: Admitting: "Endocrinology

## 2024-03-24 VITALS — BP 142/80 | HR 56 | Ht 61.0 in | Wt 153.2 lb

## 2024-03-24 DIAGNOSIS — E119 Type 2 diabetes mellitus without complications: Secondary | ICD-10-CM

## 2024-03-24 DIAGNOSIS — E782 Mixed hyperlipidemia: Secondary | ICD-10-CM

## 2024-03-24 DIAGNOSIS — I10 Essential (primary) hypertension: Secondary | ICD-10-CM | POA: Diagnosis not present

## 2024-03-24 DIAGNOSIS — Z7984 Long term (current) use of oral hypoglycemic drugs: Secondary | ICD-10-CM

## 2024-03-24 DIAGNOSIS — E0590 Thyrotoxicosis, unspecified without thyrotoxic crisis or storm: Secondary | ICD-10-CM

## 2024-03-24 MED ORDER — EMPAGLIFLOZIN 10 MG PO TABS: 10.0000 mg | ORAL_TABLET | Freq: Every day | ORAL | 1 refills | Status: AC

## 2024-03-24 MED ORDER — METHIMAZOLE 10 MG PO TABS: 10.0000 mg | ORAL_TABLET | Freq: Every day | ORAL | 1 refills | Status: AC

## 2024-03-24 NOTE — Progress Notes (Signed)
 03/24/2024, 3:59 PM   Endocrinology follow-up note  Subjective:    Patient ID: Samantha [name], female    DOB: [birth year], PCP Samantha Reynolds, NP   Past Medical History:  Diagnosis Date   Allergic rhinitis    Arthritis of both knees    Arthritis of both shoulder regions    Bilateral carpal tunnel syndrome    Cataract    Chronic low back pain    CKD (chronic kidney disease), stage III (HCC)    Decreased vision    Diabetes mellitus    HTN (hypertension)    Hyperlipidemia    Morbid obesity (HCC)    Thyroid  disease    Past Surgical History:  Procedure Laterality Date   CATARACT EXTRACTION     COLONOSCOPY     COLONOSCOPY N/A 09/15/2020   Procedure: COLONOSCOPY;  Surgeon: Samantha Lamar HERO, MD;  [facility];  Service: Endoscopy;  Laterality: N/A;  ASA II / PM procedure   POLYPECTOMY  09/15/2020   Procedure: POLYPECTOMY;  Surgeon: Samantha Lamar HERO, MD;  [facility];  Service: Endoscopy;;   TUBAL LIGATION     Social History   Socioeconomic History   Marital status: Widowed    Spouse name: Not on file   Number of children: Not on file   Years of education: Not on file   Highest education level: Not on file  Occupational History   Not on file  Tobacco Use   Smoking status: Never   Smokeless tobacco: Never  Vaping Use   Vaping status: Never Used  Substance and Sexual Activity   Alcohol use: Yes    Comment: occasionally   Drug use: No   Sexual activity: Yes    Birth control/protection: Surgical    Comment: tubal  Other Topics Concern   Not on file  Social History Narrative   Not on file   Social Drivers of Health   Financial Resource Strain: Low Risk  (11/16/2022)   Overall Financial Resource Strain (CARDIA)    Difficulty of Paying Living Expenses: Not very hard  Food Insecurity: No Food Insecurity (02/16/2024)   Hunger Vital Sign    Worried About Running Out of Food in the Last Year: Never true     Ran Out of Food in the Last Year: Never true  Transportation Needs: No Transportation Needs (02/16/2024)   PRAPARE - Administrator, Civil Service (Medical): No    Lack of Transportation (Non-Medical): No  Physical Activity: Inactive (11/16/2022)   Exercise Vital Sign    Days of Exercise per Week: 0 days    Minutes of Exercise per Session: 0 min  Stress: No Stress Concern Present (11/16/2022)   [employer] of Occupational Health - Occupational Stress Questionnaire    Feeling of Stress : Only a little  Social Connections: Moderately Integrated (02/16/2024)   Social Connection and Isolation Panel    Frequency of Communication with Friends and Family: Three times a week    Frequency of Social Gatherings with Friends and Family: Three times a week    Attends Religious Services: More than 4 times per year    Active Member of Clubs or Organizations: Yes  Attends Banker Meetings: More than 4 times per year    Marital Status: Widowed   Family History  Problem Relation Age of Onset   Hypertension Father    Hypertension Mother    Diabetes Mother    Breast cancer Mother    Hypertension Sister    Cancer Sister        pancreatic   Cancer Sister        pancreatic   Liver disease Sister    Outpatient Encounter Medications as of 03/24/2024  Medication Sig   ferrous sulfate  325 (65 FE) MG tablet Take 1 tablet (325 mg total) by mouth every Tuesday, Thursday, Saturday, and Sunday. (Patient taking differently: Take 325 mg by mouth daily.)   metoprolol  tartrate (LOPRESSOR ) 100 MG tablet Take 100 mg by mouth 2 (two) times daily.   apixaban  (ELIQUIS ) 5 MG TABS tablet Take 1 tablet (5 mg total) by mouth 2 (two) times daily.   empagliflozin  (JARDIANCE ) 10 MG TABS tablet Take 1 tablet (10 mg total) by mouth daily before breakfast.   folic acid  (FOLVITE ) 1 MG tablet Take 1 tablet (1 mg total) by mouth daily.   insulin  glargine (LANTUS ) 100 UNIT/ML Solostar Pen Inject 40  Units into the skin at bedtime. (Patient not taking: Reported on 03/24/2024)   metFORMIN  (GLUCOPHAGE -XR) 500 MG 24 hr tablet Take 1 tablet (500 mg total) by mouth daily with breakfast.   methimazole  (TAPAZOLE ) 10 MG tablet Take 1 tablet (10 mg total) by mouth daily with breakfast.   pantoprazole  (PROTONIX ) 40 MG tablet Take 1 tablet (40 mg total) by mouth daily.   rosuvastatin  (CRESTOR ) 5 MG tablet Take 1 tablet (5 mg total) by mouth every Monday, Wednesday, and Friday.   valsartan -hydrochlorothiazide (DIOVAN -HCT) 320-12.5 MG tablet Take 1 tablet by mouth daily.   [DISCONTINUED] empagliflozin  (JARDIANCE ) 10 MG TABS tablet Take 1 tablet (10 mg total) by mouth daily before breakfast. (Patient not taking: Reported on 03/24/2024)   [DISCONTINUED] insulin  lispro (HUMALOG  KWIKPEN) 100 UNIT/ML KwikPen Inject 2-10 Units into the skin 3 (three) times daily with meals.   [DISCONTINUED] methimazole  (TAPAZOLE ) 10 MG tablet Take 1 tablet (10 mg total) by mouth 2 (two) times daily.   [DISCONTINUED] metoprolol  tartrate (LOPRESSOR ) 25 MG tablet Take 1 tablet (25 mg total) by mouth 2 (two) times daily.   No facility-administered encounter medications on file as of 03/24/2024.   ALLERGIES: No Known Allergies   VACCINATION STATUS: Immunization History  Administered Date(s) Administered   H1N1 06/30/2008   Influenza Whole 03/12/2006, 06/03/2007   Moderna Sars-Covid-2 Vaccination 07/20/2019, 08/20/2019   Pneumococcal Polysaccharide-23 01/22/2007    HPI Samantha [name] is 78 y.o. female who presents today with a medical history as above. she is being seen in follow-up after she was seen in consultation for hyperthyroidism requested by Samantha Reynolds, NP.  She comes to clinic alone today, see notes from prior visit. She was diagnosed with primary hyperthyroidism based on thyroid  uptake and scan in May 2024.  After reviewing her options, incretin is doing a 59.4% 24-hour uptake showing enlarged multinodular thyroid ,  she was treated with radioactive iodine treatment on November 28, 2022 with 20 mCi of I-131 on November 28, 2022.  Her subsequent thyroid  function tests did not confirm treatment response.  Her thyroid  function tests results never return to normal nor drop below normal, and in fact her presentation was consistent with treatment failure.  During preparation for repeat treatment, she was hospitalized for separate conditions where she  was found to have severely uncontrolled diabetes with A1c of 13.1%.  That necessitated reinitiation of methimazole  treatment, methimazole  10 mg p.o. twice daily during her last visit.  She presents with thyroid  function test consistent with treatment response with evidence of iatrogenic hypothyroidism.    -She wears a Dexcom CGM which shows 87% time in range, 20% Libre 1 hyperglycemia.  Admittedly, she has not been taking Lantus , Jardiance  over the last several weeks.  The only medication she took was metformin  500 mg p.o. once a day.   She reports significant symptomatic improvement compared to her last presentation.   She denies dysphagia, shortness of breath, nor voice change.  She denies feeling palpitations, tremor, nor heat intolerance.  She denies family history of thyroid  problems.   She does not have  family history of thyroid  malignancy.  She presents with minimally fluctuating body weight.   Her other medical problems include uncontrolled hypertension on treatment with amlodipine  and lisinopril.  She remains on metformin  500 mg p.o. once a day with breakfast for type 2 diabetes with recent A1c around 7%.     Review of Systems  Constitutional: + Gained 9 pounds since last visit,  no fatigue, no subjective hyperthermia, no subjective hypothermia Eyes: no blurry vision, no xerophthalmia  Neurological: no tremors, no numbness, no tingling, no dizziness Psychiatric: no depression,  Objective:       03/24/2024    2:31 PM 02/18/2024    1:29 PM 02/17/2024   12:00 PM   Vitals with BMI  Height 5' 1 5' 1   Weight 153 lbs 3 oz 144 lbs 13 oz   BMI 28.96 27.37   Systolic 142 144 865  Diastolic 80 78 81  Pulse 56 92     BP (!) 142/80 Comment: pt states she has not taken her medication today.  Pulse (!) 56   Ht 5' 1 (1.549 m)   Wt 153 lb 3.2 oz (69.5 kg)   BMI 28.95 kg/m   Wt Readings from Last 3 Encounters:  03/24/24 153 lb 3.2 oz (69.5 kg)  02/18/24 144 lb 12.8 oz (65.7 kg)  02/16/24 145 lb 9.6 oz (66 kg)    Physical Exam  Constitutional:  Body mass index is 28.95 kg/m.,  not in acute distress, + anxious state of mind.  She ambulates with a walker due to disequilibrium. Eyes: PERRLA, EOMI, no exophthalmos    CMP ( most recent) CMP     Component Value Date/Time   NA 136 02/17/2024 0534   NA 142 09/12/2023 1627   K 4.5 02/17/2024 0534   CL 111 02/17/2024 0534   CO2 20 (L) 02/17/2024 0534   GLUCOSE 118 (H) 02/17/2024 0534   BUN 15 02/17/2024 0534   BUN 17 09/12/2023 1627   CREATININE 1.17 (H) 02/17/2024 0534   CALCIUM  8.6 (L) 02/17/2024 0534   PROT 6.5 02/16/2024 1110   PROT 6.8 09/12/2023 1627   ALBUMIN 2.1 (L) 02/17/2024 0534   ALBUMIN 4.3 09/12/2023 1627   AST 14 (L) 02/16/2024 1110   ALT 10 02/16/2024 1110   ALKPHOS 75 02/16/2024 1110   BILITOT 0.9 02/16/2024 1110   BILITOT 0.3 09/12/2023 1627   GFRNONAA 48 (L) 02/17/2024 0534   GFRAA 55 (L) 05/08/2018 2247     Diabetic Labs (most recent): Lab Results  Component Value Date   HGBA1C 13.1 (H) 02/08/2024   HGBA1C 6.6 (H) 12/08/2023   HGBA1C 7.1 (H) 03/08/2023   MICROALBUR 1.54 02/09/2009   MICROALBUR 0.64  01/16/2008   MICROALBUR 1.69 12/03/2006     Lipid Panel ( most recent) Lipid Panel     Component Value Date/Time   CHOL 283 (H) 01/15/2024 1343   TRIG 74 01/15/2024 1343   HDL 128 01/15/2024 1343   CHOLHDL 2.2 01/15/2024 1343   CHOLHDL 2.8 Ratio 02/09/2009 2026   VLDL 17 02/09/2009 2026   LDLCALC 144 (H) 01/15/2024 1343   LABVLDL 11 01/15/2024 1343       Lab Results  Component Value Date   TSH 14.500 (H) 03/21/2024   TSH <0.100 (L) 02/16/2024   TSH <0.100 (L) 02/09/2024   TSH 0.036 (L) 01/15/2024   TSH 0.017 (L) 12/07/2023   FREET4 0.62 (L) 03/21/2024   FREET4 3.71 (H) 02/16/2024   FREET4 2.68 (H) 02/09/2024   FREET4 1.90 (H) 01/15/2024   FREET4 2.72 (H) 12/07/2023     Thyroid  uptake and scan on Oct 20, 2022 FINDINGS: Unremarkable LEFT thyroid  lobe.   Enlarged RIGHT thyroid  lobe containing warm and cold nodules.   Patient has previously undergone biopsy of an indeterminate nodule in the RIGHT lobe.   4 hour I-123 uptake = 29.9% (normal 5-20%), 24 hour I-123 uptake = 59.4% (normal 10-30%)   IMPRESSION: Enlarged multinodular thyroid  gland especially RIGHT lobe. Elevated 4 hour and 24 hour radio iodine uptakes consistent with hyperthyroidism.  Nuclear medicine radioactive iodine therapy for hyperthyroidism on November 28, 2022 IMPRESSION: Per oral administration of I-131 sodium iodide for the treatment of hyperthyroidism.  20 mCi of I-131.    Assessment & Plan:   1.  Hyperthyroidism 2.  Toxic multinodular goiter 3.  Type 2 diabetes  -After appropriate workup which showed primary hyperthyroidism as a result of toxic multinodular goiter, she was treated with radioactive iodine treatment on November 28, 2022.  Her subsequent thyroid  function tests were consistent with likely treatment failure.    This patient will likely need repeat treatment with radioactive iodine ablation after pretreatment uptake and scan. -In preparation, she is advised to lower her methimazole  to 10 mg p.o. daily at breakfast next measurement. She maintains reasonable control, she will be considered for repeat thyroid  uptake and scan after off of methimazole  for 5 to 5 days.    She is also advised to continue her metoprolol  25 mg p.o. twice daily. Complications of untreated hyperthyroidism were discussed in detail with her including the risk of thyroid   storm.   - She also had new concern of severely uncontrolled diabetes which led to diabetic ketoacidosis recently. -She maintains near target glycemic profile despite she took herself off of Lantus  and Jardiance . I have advised her to resume Jardiance  at 10 mg p.o. daily at breakfast along with metformin  500 mg p.o. daily at breakfast -She can stay off of Lantus  until she visits her PCP or myself. She will call clinic for hypoglycemia under 70 or hyperglycemia 200 mg per DL weekly average.   - Suggestion is made for her to avoid simple carbohydrates  from her diet including Cakes, Sweet Desserts, Ice Cream, Soda (diet and regular), Sweet Tea, Candies, Chips, Cookies, Store Bought Juices, Alcohol , Artificial Sweeteners,  Coffee Creamer, and Sugar-free Products, Lemonade. This will help patient to have more stable blood glucose profile and potentially avoid unintended weight gain.   -Regarding her hypertension: She is currently on valsartan -320-12.5 mg p.o. daily, spironolactone  25 mg p.o. daily, metoprolol  100 mg p.o. twice daily.  Her blood pressure still not controlled.  There is a question of compliance with medications in  this patient.       - she is advised to maintain close follow up with Samantha Reynolds, NP for primary care needs.   I spent  26  minutes in the care of the patient today including review of labs from CMP, Lipids, Thyroid  Function, Hematology (current and previous including abstractions from other facilities); face-to-face time discussing  her blood glucose readings/logs, discussing hypoglycemia and hyperglycemia episodes and symptoms, medications doses, her options of short and long term treatment based on the latest standards of care / guidelines;  discussion about incorporating lifestyle medicine;  and documenting the encounter. Risk reduction counseling performed per USPSTF guidelines to reduce cardiovascular risk factors.     Please refer to Patient Instructions for  Blood Glucose Monitoring and Insulin /Medications Dosing Guide  in media tab for additional information. Please  also refer to  Patient Self Inventory in the Media  tab for reviewed elements of pertinent patient history.  Samantha [name] participated in the discussions, expressed understanding, and voiced agreement with the above plans.  All questions were answered to her satisfaction. she is encouraged to contact clinic should she have any questions or concerns prior to her return visit.   Follow up plan: Return in about 4 months (around 07/25/2024) for F/U with Pre-visit Labs.   Ranny Earl, MD [facility] [address] [place], [place] [address] Phone: [phone]  Fax: [phone]     03/24/2024, 3:59 PM  This note was partially dictated with voice recognition software. Similar sounding words can be transcribed inadequately or may not  be corrected upon review.

## 2024-03-26 ENCOUNTER — Encounter: Payer: Self-pay | Admitting: Cardiology

## 2024-03-26 ENCOUNTER — Ambulatory Visit: Attending: Cardiovascular Disease | Admitting: Cardiology

## 2024-03-26 VITALS — BP 160/76 | HR 55 | Ht 61.0 in | Wt 150.8 lb

## 2024-03-26 DIAGNOSIS — R002 Palpitations: Secondary | ICD-10-CM

## 2024-03-26 DIAGNOSIS — E119 Type 2 diabetes mellitus without complications: Secondary | ICD-10-CM

## 2024-03-26 DIAGNOSIS — Z794 Long term (current) use of insulin: Secondary | ICD-10-CM

## 2024-03-26 DIAGNOSIS — E0590 Thyrotoxicosis, unspecified without thyrotoxic crisis or storm: Secondary | ICD-10-CM

## 2024-03-26 DIAGNOSIS — I10 Essential (primary) hypertension: Secondary | ICD-10-CM

## 2024-03-26 DIAGNOSIS — I480 Paroxysmal atrial fibrillation: Secondary | ICD-10-CM

## 2024-03-26 DIAGNOSIS — Z8679 Personal history of other diseases of the circulatory system: Secondary | ICD-10-CM

## 2024-03-26 DIAGNOSIS — N1832 Chronic kidney disease, stage 3b: Secondary | ICD-10-CM

## 2024-03-26 NOTE — Patient Instructions (Addendum)
 Medication Instructions:  Your physician recommends that you continue on your current medications as directed. Please refer to the Current Medication list given to you today.  *If you need a refill on your cardiac medications before your next appointment, please call your pharmacy*    Follow-Up: At [facility], you and your health needs are our priority.  As part of our continuing mission to provide you with exceptional heart care, our providers are all part of one team.  This team includes your primary Cardiologist (physician) and Advanced Practice Providers or APPs (Physician Assistants and Nurse Practitioners) who all work together to provide you with the care you need, when you need it.  Your next appointment:   4 month(s)  Provider:   Maude Emmer, MD      Please log your blood pressure for the next 2 weeks. You can send us  the records through MyChart or drop them off at our office.

## 2024-04-08 ENCOUNTER — Other Ambulatory Visit: Payer: Self-pay | Admitting: Cardiology

## 2024-04-09 NOTE — Telephone Encounter (Signed)
 Pt of Dr. Delford. This RX is being requested by the Pharmacy. It was D/C'd in the hospital. Does Dr. Delford want to refill? Please advise.

## 2024-04-09 NOTE — Telephone Encounter (Signed)
 According to 12/2023 ov note- K. Johnson PA-C and Take Colchicine  0.6 mg once a day for the next 3 months then stop --    03/26/24 History of pericarditis - Diagnosed with pericarditis in 11/2023.  Completed treatment of colchicine , prednisone  - Patient denies chest pain    Do not  refill medication

## 2024-06-17 ENCOUNTER — Other Ambulatory Visit: Payer: Self-pay | Admitting: Cardiology

## 2024-06-19 ENCOUNTER — Other Ambulatory Visit: Payer: Self-pay

## 2024-06-19 MED ORDER — DEXCOM G7 SENSOR MISC: 0 refills | Status: AC

## 2024-06-25 NOTE — Progress Notes (Unsigned)
 "  Office Visit Note  Patient: Samantha [name]             Date of Birth: 1945-07-06           MRN: [id number]             PCP: Campbell Reynolds, NP Referring: Vicci Rollo SAUNDERS, PA-C Visit Date: 07/09/2024 Occupation: Data Unavailable  Subjective:  No chief complaint on file.   History of Present Illness: [name] is a 79 y.o. female ***    Patient was diagnosed with pericarditis in June 2025.  She was treated with prednisone  and colchicine  and symptoms completely resolved per last cardiology note from March 26, 2024. Activities of Daily Living:  Patient reports morning stiffness for *** {minute/hour:19697}.   Patient {ACTIONS;DENIES/REPORTS:21021675::Denies} nocturnal pain.  Difficulty dressing/grooming: {ACTIONS;DENIES/REPORTS:21021675::Denies} Difficulty climbing stairs: {ACTIONS;DENIES/REPORTS:21021675::Denies} Difficulty getting out of chair: {ACTIONS;DENIES/REPORTS:21021675::Denies} Difficulty using hands for taps, buttons, cutlery, and/or writing: {ACTIONS;DENIES/REPORTS:21021675::Denies}  No Rheumatology ROS completed.   PMFS History:  Patient Active Problem List   Diagnosis Date Noted   Uncontrolled type 2 diabetes mellitus with hyperglycemia, with long-term current use of insulin  (HCC) 02/17/2024   Afib (HCC) 02/16/2024   DKA, type 2, not at goal [facility]) 02/09/2024   CKD (chronic kidney disease) stage 3, GFR 30-59 ml/min (HCC) 12/08/2023   Acute idiopathic pericarditis 12/08/2023   Pericardial effusion 12/08/2023   Pneumonia 12/07/2023   Right wrist pain 03/10/2023   Increased anion gap metabolic acidosis 03/10/2023   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   Septic joint of right wrist (HCC) 03/07/2023   SVT (supraventricular tachycardia) 01/26/2023   Paroxysmal atrial fibrillation (HCC) 01/25/2023   Malnutrition of moderate degree 01/24/2023   Sepsis secondary to UTI (HCC) 01/23/2023   Vaginal dryness 11/16/2022   Normal breast exam  11/16/2022   Vaginal discharge 11/16/2022   Vulvar itching 11/16/2022   Screening examination for STD (sexually transmitted disease) 11/16/2022   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   Type 2 diabetes mellitus without complication, without long-term current use of insulin  (HCC) 10/06/2022   Hyperthyroidism 10/05/2022   Pain in joint of right knee 06/20/2022   KNEE, ARTHRITIS, DEGEN./OSTEO 12/16/2007   DERANGEMENT MENISCUS 12/16/2007   HLD (hyperlipidemia) 12/18/2006   HYPERLIPIDEMIA 05/07/2006   OBESITY NOS 05/07/2006   ANEMIA-NOS 05/07/2006   CARPAL TUNNEL SYNDROME 05/07/2006   Essential hypertension, benign 05/07/2006    Past Medical History:  Diagnosis Date   Allergic rhinitis    Arthritis of both knees    Arthritis of both shoulder regions    Bilateral carpal tunnel syndrome    Cataract    Chronic low back pain    CKD (chronic kidney disease), stage III (HCC)    Decreased vision    Diabetes mellitus    HTN (hypertension)    Hyperlipidemia    Morbid obesity (HCC)    Thyroid  disease     Family History  Problem Relation Age of Onset   Hypertension Father    Hypertension Mother    Diabetes Mother    Breast cancer Mother    Hypertension Sister    Cancer Sister        pancreatic   Cancer Sister        pancreatic   Liver disease Sister    Past Surgical History:  Procedure Laterality Date   CATARACT EXTRACTION     COLONOSCOPY     COLONOSCOPY N/A 09/15/2020   Procedure: COLONOSCOPY;  Surgeon: Shaaron Lamar HERO, MD;  [facility];  Service: Endoscopy;  Laterality: N/A;  ASA II / PM procedure   POLYPECTOMY  09/15/2020   Procedure: POLYPECTOMY;  Surgeon: Shaaron Lamar HERO, MD;  [facility];  Service: Endoscopy;;   TUBAL LIGATION     Social History[1] Social History   Social History Narrative   Not on file     Immunization History  Administered Date(s) Administered   H1N1 06/30/2008   Influenza Whole 03/12/2006, 06/03/2007   Moderna Sars-Covid-2  Vaccination 07/20/2019, 08/20/2019   Pneumococcal Polysaccharide-23 01/22/2007     Objective: Vital Signs: There were no vitals taken for this visit.   Physical Exam   Musculoskeletal Exam: ***  CDAI Exam: CDAI Score: -- Patient Global: --; Provider Global: -- Swollen: --; Tender: -- Joint Exam 07/09/2024   No joint exam has been documented for this visit   There is currently no information documented on the homunculus. Go to the Rheumatology activity and complete the homunculus joint exam.  Investigation: No additional findings.  Imaging: No results found.  Recent Labs: Lab Results  Component Value Date   WBC 7.0 03/21/2024   HGB 10.2 (L) 03/21/2024   PLT 445 03/21/2024   NA 136 02/17/2024   K 4.5 02/17/2024   CL 111 02/17/2024   CO2 20 (L) 02/17/2024   GLUCOSE 118 (H) 02/17/2024   BUN 15 02/17/2024   CREATININE 1.17 (H) 02/17/2024   BILITOT 0.9 02/16/2024   ALKPHOS 75 02/16/2024   AST 14 (L) 02/16/2024   ALT 10 02/16/2024   PROT 6.5 02/16/2024   ALBUMIN 2.1 (L) 02/17/2024   CALCIUM  8.6 (L) 02/17/2024   GFRAA 55 (L) 05/08/2018   December 19, 2023 ANA negative, RF negative, anti-CCP negative, sed rate 85, CRP 58 Speciality Comments: No specialty comments available.  Procedures:  No procedures performed Allergies: Patient has no known allergies.   Assessment / Plan:     Visit Diagnoses: Elevated C-reactive protein  Uncontrolled type 2 diabetes mellitus with hyperglycemia, with long-term current use of insulin  (HCC)  Toxic multinodul goiter  SVT (supraventricular tachycardia)  Right wrist pain  Pericardial effusion  Paroxysmal atrial fibrillation (HCC)  Pain in joint of right knee  Mixed hyperlipidemia  Essential hypertension, benign  Stage 3a chronic kidney disease (HCC)  Reactive arthritis of right wrist (HCC)  Orders: No orders of the defined types were placed in this encounter.  No orders of the defined types were placed in this  encounter.   Face-to-face time spent with patient was *** minutes. Greater than 50% of time was spent in counseling and coordination of care.  Follow-Up Instructions: No follow-ups on file.   [name], MD  Note - This record has been created using Animal nutritionist.  Chart creation errors have been sought, but may not always  have been located. Such creation errors do not reflect on  the standard of medical care.    [1]  Social History Tobacco Use   Smoking status: Never   Smokeless tobacco: Never  Vaping Use   Vaping status: Never Used  Substance Use Topics   Alcohol use: Yes    Comment: occasionally   Drug use: No   "

## 2024-06-27 ENCOUNTER — Telehealth: Payer: Self-pay | Admitting: "Endocrinology

## 2024-06-27 NOTE — Telephone Encounter (Signed)
 Pt needs PA sent for [employer]

## 2024-07-04 ENCOUNTER — Telehealth: Payer: Self-pay

## 2024-07-04 ENCOUNTER — Other Ambulatory Visit (HOSPITAL_COMMUNITY): Payer: Self-pay

## 2024-07-04 NOTE — Telephone Encounter (Signed)
 Pharmacy Patient Advocate Encounter   Received notification from Pt Calls Messages that prior authorization for Dexcom g7 sensor is required/requested.   Insurance verification completed.   The patient is insured through [facility].   Per test claim: PA required; PA submitted to above mentioned insurance via Latent Key/confirmation #/EOC [facility] Status is pending

## 2024-07-07 ENCOUNTER — Other Ambulatory Visit (HOSPITAL_COMMUNITY): Payer: Self-pay

## 2024-07-07 NOTE — Telephone Encounter (Signed)
 Pharmacy Patient Advocate Encounter  Received notification from [facility] MEDICAID that Prior Authorization for Dexcom G7 Sensor  has been APPROVED from 06/20/24 to 06/11/2098. Unable to obtain price due to refill too soon rejection, last fill date 07/07/24 next available fill date 09/25/24   PA #/Case ID/Reference #: [id number]

## 2024-07-08 NOTE — Telephone Encounter (Signed)
 Addressed in new encounter.

## 2024-07-09 ENCOUNTER — Encounter: Admitting: Rheumatology

## 2024-07-09 DIAGNOSIS — M25561 Pain in right knee: Secondary | ICD-10-CM

## 2024-07-09 DIAGNOSIS — I480 Paroxysmal atrial fibrillation: Secondary | ICD-10-CM

## 2024-07-09 DIAGNOSIS — E782 Mixed hyperlipidemia: Secondary | ICD-10-CM

## 2024-07-09 DIAGNOSIS — M02331 Reiter's disease, right wrist: Secondary | ICD-10-CM

## 2024-07-09 DIAGNOSIS — E0520 Thyrotoxicosis with toxic multinodular goiter without thyrotoxic crisis or storm: Secondary | ICD-10-CM

## 2024-07-09 DIAGNOSIS — I10 Essential (primary) hypertension: Secondary | ICD-10-CM

## 2024-07-09 DIAGNOSIS — R7982 Elevated C-reactive protein (CRP): Secondary | ICD-10-CM

## 2024-07-09 DIAGNOSIS — E1165 Type 2 diabetes mellitus with hyperglycemia: Secondary | ICD-10-CM

## 2024-07-09 DIAGNOSIS — I471 Supraventricular tachycardia, unspecified: Secondary | ICD-10-CM

## 2024-07-09 DIAGNOSIS — A419 Sepsis, unspecified organism: Secondary | ICD-10-CM

## 2024-07-09 DIAGNOSIS — N1831 Chronic kidney disease, stage 3a: Secondary | ICD-10-CM

## 2024-07-09 DIAGNOSIS — I3139 Other pericardial effusion (noninflammatory): Secondary | ICD-10-CM

## 2024-07-30 ENCOUNTER — Ambulatory Visit: Admitting: "Endocrinology

## 2024-08-07 ENCOUNTER — Ambulatory Visit: Admitting: Rheumatology

## 2024-08-14 ENCOUNTER — Ambulatory Visit: Payer: Self-pay | Admitting: Cardiovascular Disease
